# Patient Record
Sex: Female | Born: 1945
Health system: Southern US, Community
[De-identification: ages and names within clinical notes are randomized; demographics above are authoritative.]

## PROBLEM LIST (undated history)

## (undated) DIAGNOSIS — K5790 Diverticulosis of intestine, part unspecified, without perforation or abscess without bleeding: Secondary | ICD-10-CM

## (undated) DIAGNOSIS — E785 Hyperlipidemia, unspecified: Secondary | ICD-10-CM

## (undated) DIAGNOSIS — I10 Essential (primary) hypertension: Secondary | ICD-10-CM

## (undated) DIAGNOSIS — T7840XA Allergy, unspecified, initial encounter: Secondary | ICD-10-CM

## (undated) DIAGNOSIS — K219 Gastro-esophageal reflux disease without esophagitis: Secondary | ICD-10-CM

## (undated) DIAGNOSIS — H269 Unspecified cataract: Secondary | ICD-10-CM

## (undated) DIAGNOSIS — J45909 Unspecified asthma, uncomplicated: Secondary | ICD-10-CM

## (undated) DIAGNOSIS — M1990 Unspecified osteoarthritis, unspecified site: Secondary | ICD-10-CM

## (undated) DIAGNOSIS — I341 Nonrheumatic mitral (valve) prolapse: Secondary | ICD-10-CM

## (undated) DIAGNOSIS — IMO0002 Reserved for concepts with insufficient information to code with codable children: Secondary | ICD-10-CM

## (undated) DIAGNOSIS — R87619 Unspecified abnormal cytological findings in specimens from cervix uteri: Secondary | ICD-10-CM

## (undated) DIAGNOSIS — C801 Malignant (primary) neoplasm, unspecified: Secondary | ICD-10-CM

## (undated) DIAGNOSIS — M8580 Other specified disorders of bone density and structure, unspecified site: Secondary | ICD-10-CM

## (undated) HISTORY — DX: Diverticulosis of intestine, part unspecified, without perforation or abscess without bleeding: K57.90

## (undated) HISTORY — DX: Nonrheumatic mitral (valve) prolapse: I34.1

## (undated) HISTORY — PX: TRIGGER FINGER RELEASE: SHX641

## (undated) HISTORY — DX: Allergy, unspecified, initial encounter: T78.40XA

## (undated) HISTORY — PX: HAND SURGERY: SHX662

## (undated) HISTORY — DX: Hyperlipidemia, unspecified: E78.5

## (undated) HISTORY — PX: TUBAL LIGATION: SHX77

## (undated) HISTORY — DX: Unspecified cataract: H26.9

## (undated) HISTORY — DX: Unspecified osteoarthritis, unspecified site: M19.90

## (undated) HISTORY — PX: CATARACT EXTRACTION, BILATERAL: SHX1313

## (undated) HISTORY — DX: Other specified disorders of bone density and structure, unspecified site: M85.80

## (undated) HISTORY — DX: Essential (primary) hypertension: I10

## (undated) HISTORY — DX: Unspecified asthma, uncomplicated: J45.909

## (undated) HISTORY — DX: Reserved for concepts with insufficient information to code with codable children: IMO0002

## (undated) HISTORY — DX: Gastro-esophageal reflux disease without esophagitis: K21.9

## (undated) HISTORY — DX: Unspecified abnormal cytological findings in specimens from cervix uteri: R87.619

## (undated) HISTORY — DX: Malignant (primary) neoplasm, unspecified: C80.1

---

## 1996-05-07 HISTORY — PX: COLONOSCOPY W/ POLYPECTOMY: SHX1380

## 1998-03-01 ENCOUNTER — Other Ambulatory Visit: Admission: RE | Admit: 1998-03-01 | Discharge: 1998-03-01 | Payer: Self-pay | Admitting: Gynecology

## 1998-03-28 ENCOUNTER — Other Ambulatory Visit: Admission: RE | Admit: 1998-03-28 | Discharge: 1998-03-28 | Payer: Self-pay | Admitting: Gynecology

## 1999-04-18 ENCOUNTER — Other Ambulatory Visit: Admission: RE | Admit: 1999-04-18 | Discharge: 1999-04-18 | Payer: Self-pay | Admitting: Gynecology

## 1999-08-07 ENCOUNTER — Encounter: Admission: RE | Admit: 1999-08-07 | Discharge: 1999-08-07 | Payer: Self-pay | Admitting: Gynecology

## 1999-08-07 ENCOUNTER — Encounter: Payer: Self-pay | Admitting: Gynecology

## 2000-05-16 ENCOUNTER — Other Ambulatory Visit: Admission: RE | Admit: 2000-05-16 | Discharge: 2000-05-16 | Payer: Self-pay | Admitting: Gynecology

## 2000-08-12 ENCOUNTER — Encounter: Admission: RE | Admit: 2000-08-12 | Discharge: 2000-08-12 | Payer: Self-pay | Admitting: Internal Medicine

## 2000-08-12 ENCOUNTER — Encounter: Payer: Self-pay | Admitting: Internal Medicine

## 2001-06-10 ENCOUNTER — Other Ambulatory Visit: Admission: RE | Admit: 2001-06-10 | Discharge: 2001-06-10 | Payer: Self-pay | Admitting: Gynecology

## 2001-08-14 ENCOUNTER — Encounter: Admission: RE | Admit: 2001-08-14 | Discharge: 2001-08-14 | Payer: Self-pay | Admitting: Internal Medicine

## 2001-08-14 ENCOUNTER — Encounter: Payer: Self-pay | Admitting: Internal Medicine

## 2002-07-14 ENCOUNTER — Other Ambulatory Visit: Admission: RE | Admit: 2002-07-14 | Discharge: 2002-07-14 | Payer: Self-pay | Admitting: Gynecology

## 2002-08-20 ENCOUNTER — Encounter: Payer: Self-pay | Admitting: Internal Medicine

## 2002-08-20 ENCOUNTER — Encounter: Admission: RE | Admit: 2002-08-20 | Discharge: 2002-08-20 | Payer: Self-pay | Admitting: Internal Medicine

## 2003-07-29 ENCOUNTER — Other Ambulatory Visit: Admission: RE | Admit: 2003-07-29 | Discharge: 2003-07-29 | Payer: Self-pay | Admitting: Gynecology

## 2003-08-27 ENCOUNTER — Encounter: Admission: RE | Admit: 2003-08-27 | Discharge: 2003-08-27 | Payer: Self-pay | Admitting: Gynecology

## 2004-04-20 ENCOUNTER — Ambulatory Visit: Payer: Self-pay | Admitting: Oncology

## 2004-06-15 ENCOUNTER — Ambulatory Visit: Payer: Self-pay | Admitting: Internal Medicine

## 2004-08-03 ENCOUNTER — Other Ambulatory Visit: Admission: RE | Admit: 2004-08-03 | Discharge: 2004-08-03 | Payer: Self-pay | Admitting: Gynecology

## 2004-09-07 ENCOUNTER — Encounter: Admission: RE | Admit: 2004-09-07 | Discharge: 2004-09-07 | Payer: Self-pay | Admitting: Gynecology

## 2005-01-17 ENCOUNTER — Ambulatory Visit: Payer: Self-pay | Admitting: Internal Medicine

## 2005-08-07 ENCOUNTER — Ambulatory Visit (HOSPITAL_BASED_OUTPATIENT_CLINIC_OR_DEPARTMENT_OTHER): Admission: RE | Admit: 2005-08-07 | Discharge: 2005-08-07 | Payer: Self-pay | Admitting: Orthopedic Surgery

## 2005-08-31 ENCOUNTER — Ambulatory Visit: Payer: Self-pay | Admitting: Internal Medicine

## 2005-09-13 ENCOUNTER — Encounter: Admission: RE | Admit: 2005-09-13 | Discharge: 2005-09-13 | Payer: Self-pay | Admitting: Gynecology

## 2005-09-14 ENCOUNTER — Ambulatory Visit: Payer: Self-pay | Admitting: Internal Medicine

## 2005-09-17 ENCOUNTER — Encounter: Admission: RE | Admit: 2005-09-17 | Discharge: 2005-09-17 | Payer: Self-pay | Admitting: Gynecology

## 2006-01-11 ENCOUNTER — Ambulatory Visit: Payer: Self-pay | Admitting: Internal Medicine

## 2006-03-05 ENCOUNTER — Observation Stay (HOSPITAL_COMMUNITY): Admission: EM | Admit: 2006-03-05 | Discharge: 2006-03-06 | Payer: Self-pay | Admitting: Emergency Medicine

## 2006-03-05 ENCOUNTER — Ambulatory Visit: Payer: Self-pay | Admitting: Internal Medicine

## 2006-03-05 HISTORY — PX: CARDIOVASCULAR STRESS TEST: SHX262

## 2006-03-06 ENCOUNTER — Ambulatory Visit: Payer: Self-pay

## 2006-03-08 ENCOUNTER — Ambulatory Visit: Payer: Self-pay | Admitting: Internal Medicine

## 2006-03-14 ENCOUNTER — Ambulatory Visit: Payer: Self-pay | Admitting: Internal Medicine

## 2006-05-16 ENCOUNTER — Ambulatory Visit: Payer: Self-pay | Admitting: Internal Medicine

## 2006-06-20 ENCOUNTER — Ambulatory Visit: Payer: Self-pay | Admitting: Internal Medicine

## 2006-06-21 ENCOUNTER — Emergency Department (HOSPITAL_COMMUNITY): Admission: EM | Admit: 2006-06-21 | Discharge: 2006-06-21 | Payer: Self-pay | Admitting: Emergency Medicine

## 2006-07-08 ENCOUNTER — Ambulatory Visit: Payer: Self-pay | Admitting: Internal Medicine

## 2006-10-17 ENCOUNTER — Ambulatory Visit: Payer: Self-pay | Admitting: Internal Medicine

## 2006-10-17 DIAGNOSIS — Z860100 Personal history of colon polyps, unspecified: Secondary | ICD-10-CM | POA: Insufficient documentation

## 2006-11-21 ENCOUNTER — Encounter: Admission: RE | Admit: 2006-11-21 | Discharge: 2006-11-21 | Payer: Self-pay | Admitting: Gynecology

## 2006-12-03 ENCOUNTER — Ambulatory Visit: Payer: Self-pay | Admitting: Internal Medicine

## 2006-12-03 LAB — CONVERTED CEMR LAB
ALT: 16 units/L (ref 0–40)
Cholesterol: 235 mg/dL (ref 0–200)
Creatinine, Ser: 0.7 mg/dL (ref 0.4–1.2)
Potassium: 3.4 meq/L — ABNORMAL LOW (ref 3.5–5.1)
VLDL: 40 mg/dL (ref 0–40)

## 2006-12-06 ENCOUNTER — Telehealth: Payer: Self-pay | Admitting: Internal Medicine

## 2006-12-13 ENCOUNTER — Ambulatory Visit: Payer: Self-pay | Admitting: Internal Medicine

## 2006-12-13 DIAGNOSIS — T887XXA Unspecified adverse effect of drug or medicament, initial encounter: Secondary | ICD-10-CM | POA: Insufficient documentation

## 2006-12-16 ENCOUNTER — Telehealth (INDEPENDENT_AMBULATORY_CARE_PROVIDER_SITE_OTHER): Payer: Self-pay | Admitting: *Deleted

## 2006-12-17 ENCOUNTER — Ambulatory Visit: Payer: Self-pay | Admitting: Internal Medicine

## 2006-12-18 ENCOUNTER — Ambulatory Visit: Payer: Self-pay | Admitting: Internal Medicine

## 2006-12-19 ENCOUNTER — Encounter (INDEPENDENT_AMBULATORY_CARE_PROVIDER_SITE_OTHER): Payer: Self-pay | Admitting: *Deleted

## 2007-03-13 ENCOUNTER — Ambulatory Visit: Payer: Self-pay | Admitting: Internal Medicine

## 2007-03-13 LAB — CONVERTED CEMR LAB
Direct LDL: 108.8 mg/dL
Triglycerides: 227 mg/dL (ref 0–149)
VLDL: 45 mg/dL — ABNORMAL HIGH (ref 0–40)

## 2007-03-20 ENCOUNTER — Ambulatory Visit: Payer: Self-pay | Admitting: Internal Medicine

## 2007-03-20 DIAGNOSIS — I10 Essential (primary) hypertension: Secondary | ICD-10-CM

## 2007-03-20 DIAGNOSIS — E782 Mixed hyperlipidemia: Secondary | ICD-10-CM

## 2007-03-20 DIAGNOSIS — R609 Edema, unspecified: Secondary | ICD-10-CM | POA: Insufficient documentation

## 2007-03-20 LAB — CONVERTED CEMR LAB: LDL Goal: 130 mg/dL

## 2007-05-08 HISTORY — PX: CARPAL TUNNEL RELEASE: SHX101

## 2007-05-16 ENCOUNTER — Encounter: Payer: Self-pay | Admitting: Internal Medicine

## 2007-05-19 ENCOUNTER — Encounter (INDEPENDENT_AMBULATORY_CARE_PROVIDER_SITE_OTHER): Payer: Self-pay | Admitting: *Deleted

## 2007-05-19 ENCOUNTER — Ambulatory Visit: Payer: Self-pay | Admitting: Internal Medicine

## 2007-05-29 ENCOUNTER — Ambulatory Visit (HOSPITAL_BASED_OUTPATIENT_CLINIC_OR_DEPARTMENT_OTHER): Admission: RE | Admit: 2007-05-29 | Discharge: 2007-05-29 | Payer: Self-pay | Admitting: Orthopedic Surgery

## 2007-07-18 ENCOUNTER — Ambulatory Visit: Payer: Self-pay | Admitting: Internal Medicine

## 2007-07-18 LAB — CONVERTED CEMR LAB
Cholesterol: 206 mg/dL (ref 0–200)
Direct LDL: 125.9 mg/dL
HDL: 42.1 mg/dL (ref >39.0)
Hgb A1c MFr Bld: 5.6 % (ref 4.6–6.0)
Total CHOL/HDL Ratio: 4.9
Triglycerides: 177 mg/dL — ABNORMAL HIGH (ref 0–149)
VLDL: 35 mg/dL (ref 0–40)

## 2007-07-25 ENCOUNTER — Ambulatory Visit: Payer: Self-pay | Admitting: Internal Medicine

## 2007-10-24 ENCOUNTER — Telehealth (INDEPENDENT_AMBULATORY_CARE_PROVIDER_SITE_OTHER): Payer: Self-pay | Admitting: *Deleted

## 2007-10-24 ENCOUNTER — Ambulatory Visit: Payer: Self-pay | Admitting: Internal Medicine

## 2007-10-24 LAB — CONVERTED CEMR LAB
ALT: 14 units/L (ref 0–35)
Albumin: 3.7 g/dL (ref 3.5–5.2)
Alkaline Phosphatase: 44 units/L (ref 39–117)
BUN: 17 mg/dL (ref 6–23)
Bilirubin, Direct: 0.1 mg/dL (ref 0.0–0.3)
Cholesterol: 165 mg/dL (ref 0–200)
Total Bilirubin: 0.8 mg/dL (ref 0.3–1.2)
Total CHOL/HDL Ratio: 3.8

## 2007-10-31 ENCOUNTER — Ambulatory Visit: Payer: Self-pay | Admitting: Internal Medicine

## 2007-10-31 DIAGNOSIS — M8580 Other specified disorders of bone density and structure, unspecified site: Secondary | ICD-10-CM

## 2007-10-31 LAB — CONVERTED CEMR LAB: HDL goal, serum: 50 mg/dL

## 2007-11-06 ENCOUNTER — Encounter: Payer: Self-pay | Admitting: Internal Medicine

## 2007-11-06 ENCOUNTER — Ambulatory Visit: Payer: Self-pay | Admitting: Internal Medicine

## 2007-12-05 ENCOUNTER — Telehealth (INDEPENDENT_AMBULATORY_CARE_PROVIDER_SITE_OTHER): Payer: Self-pay | Admitting: *Deleted

## 2007-12-11 ENCOUNTER — Encounter: Admission: RE | Admit: 2007-12-11 | Discharge: 2007-12-11 | Payer: Self-pay | Admitting: Gynecology

## 2007-12-31 ENCOUNTER — Ambulatory Visit: Payer: Self-pay | Admitting: Internal Medicine

## 2007-12-31 DIAGNOSIS — K219 Gastro-esophageal reflux disease without esophagitis: Secondary | ICD-10-CM

## 2007-12-31 DIAGNOSIS — K5730 Diverticulosis of large intestine without perforation or abscess without bleeding: Secondary | ICD-10-CM | POA: Insufficient documentation

## 2007-12-31 LAB — CONVERTED CEMR LAB
Ketones, urine, test strip: NEGATIVE
Urobilinogen, UA: 0.2

## 2008-01-01 ENCOUNTER — Telehealth: Payer: Self-pay | Admitting: Internal Medicine

## 2008-01-01 LAB — CONVERTED CEMR LAB
ALT: 14 units/L (ref 0–35)
Alkaline Phosphatase: 49 units/L (ref 39–117)
Amylase: 50 units/L (ref 27–131)
Basophils Absolute: 0 10*3/uL (ref 0.0–0.1)
Eosinophils Absolute: 0.1 10*3/uL (ref 0.0–0.7)
Eosinophils Relative: 1.5 % (ref 0.0–5.0)
Hemoglobin: 13.7 g/dL (ref 12.0–15.0)
Lymphocytes Relative: 27.6 % (ref 12.0–46.0)
MCHC: 34.5 g/dL (ref 30.0–36.0)
Neutro Abs: 4.3 10*3/uL (ref 1.4–7.7)
Total Protein: 7.5 g/dL (ref 6.0–8.3)
WBC: 6.7 10*3/uL (ref 4.5–10.5)

## 2008-01-05 ENCOUNTER — Encounter (INDEPENDENT_AMBULATORY_CARE_PROVIDER_SITE_OTHER): Payer: Self-pay | Admitting: *Deleted

## 2008-02-06 ENCOUNTER — Telehealth (INDEPENDENT_AMBULATORY_CARE_PROVIDER_SITE_OTHER): Payer: Self-pay | Admitting: *Deleted

## 2008-02-07 ENCOUNTER — Ambulatory Visit: Payer: Self-pay | Admitting: Family Medicine

## 2008-02-07 LAB — CONVERTED CEMR LAB
Blood in Urine, dipstick: NEGATIVE
Glucose, Urine, Semiquant: NEGATIVE
Nitrite: NEGATIVE
Protein, U semiquant: NEGATIVE
Specific Gravity, Urine: 1.01
Urobilinogen, UA: 0.2
WBC Urine, dipstick: NEGATIVE

## 2008-05-06 ENCOUNTER — Ambulatory Visit: Payer: Self-pay | Admitting: Internal Medicine

## 2008-05-06 LAB — CONVERTED CEMR LAB
ALT: 17 units/L (ref 0–35)
AST: 23 units/L (ref 0–37)
Alkaline Phosphatase: 53 units/L (ref 39–117)
BUN: 15 mg/dL (ref 6–23)
Creatinine, Ser: 0.6 mg/dL (ref 0.4–1.2)
Potassium: 3.9 meq/L (ref 3.5–5.1)
Total Bilirubin: 1.1 mg/dL (ref 0.3–1.2)
Total Protein: 7.1 g/dL (ref 6.0–8.3)
Vit D, 1,25-Dihydroxy: 40 (ref 30–89)

## 2008-05-10 ENCOUNTER — Encounter (INDEPENDENT_AMBULATORY_CARE_PROVIDER_SITE_OTHER): Payer: Self-pay | Admitting: *Deleted

## 2008-05-13 ENCOUNTER — Ambulatory Visit: Payer: Self-pay | Admitting: Internal Medicine

## 2008-06-14 ENCOUNTER — Ambulatory Visit: Payer: Self-pay | Admitting: Internal Medicine

## 2008-06-18 ENCOUNTER — Ambulatory Visit: Payer: Self-pay | Admitting: Internal Medicine

## 2008-06-18 LAB — CONVERTED CEMR LAB
Blood in Urine, dipstick: NEGATIVE
Ketones, urine, test strip: NEGATIVE
Nitrite: NEGATIVE
Protein, U semiquant: NEGATIVE
Specific Gravity, Urine: 1.015

## 2008-06-22 ENCOUNTER — Encounter (INDEPENDENT_AMBULATORY_CARE_PROVIDER_SITE_OTHER): Payer: Self-pay | Admitting: *Deleted

## 2008-06-22 ENCOUNTER — Telehealth: Payer: Self-pay | Admitting: Internal Medicine

## 2008-06-22 LAB — CONVERTED CEMR LAB
ALT: 20 units/L (ref 0–35)
BUN: 12 mg/dL (ref 6–23)
Basophils Absolute: 0.1 10*3/uL (ref 0.0–0.1)
Basophils Relative: 0.9 % (ref 0.0–3.0)
CO2: 30 meq/L (ref 19–32)
Eosinophils Absolute: 0.1 10*3/uL (ref 0.0–0.7)
Glucose, Bld: 100 mg/dL — ABNORMAL HIGH (ref 70–99)
Neutro Abs: 4.1 10*3/uL (ref 1.4–7.7)
Neutrophils Relative %: 61.4 % (ref 43.0–77.0)
Platelets: 222 10*3/uL (ref 150–400)
Potassium: 4.7 meq/L (ref 3.5–5.1)
RDW: 11.5 % (ref 11.5–14.6)
Sodium: 138 meq/L (ref 135–145)
Total Bilirubin: 0.8 mg/dL (ref 0.3–1.2)
Total Protein: 7.5 g/dL (ref 6.0–8.3)

## 2008-11-11 ENCOUNTER — Ambulatory Visit: Payer: Self-pay | Admitting: Internal Medicine

## 2008-11-11 LAB — CONVERTED CEMR LAB
Bilirubin Urine: NEGATIVE
Blood in Urine, dipstick: NEGATIVE
Protein, U semiquant: NEGATIVE
WBC Urine, dipstick: NEGATIVE
pH: 7

## 2008-11-21 LAB — CONVERTED CEMR LAB
ALT: 22 units/L (ref 0–35)
AST: 24 units/L (ref 0–37)
Alkaline Phosphatase: 50 units/L (ref 39–117)
Bilirubin, Direct: 0.1 mg/dL (ref 0.0–0.3)
Cholesterol: 166 mg/dL (ref 0–200)
Total Bilirubin: 1.2 mg/dL (ref 0.3–1.2)
Total CHOL/HDL Ratio: 4
Triglycerides: 162 mg/dL — ABNORMAL HIGH (ref 0.0–149.0)

## 2008-11-23 ENCOUNTER — Encounter (INDEPENDENT_AMBULATORY_CARE_PROVIDER_SITE_OTHER): Payer: Self-pay | Admitting: *Deleted

## 2008-12-17 ENCOUNTER — Encounter: Admission: RE | Admit: 2008-12-17 | Discharge: 2008-12-17 | Payer: Self-pay | Admitting: Gynecology

## 2009-06-09 ENCOUNTER — Ambulatory Visit: Payer: Self-pay | Admitting: Internal Medicine

## 2009-06-09 DIAGNOSIS — M2550 Pain in unspecified joint: Secondary | ICD-10-CM | POA: Insufficient documentation

## 2009-08-02 ENCOUNTER — Ambulatory Visit: Payer: Self-pay | Admitting: Internal Medicine

## 2009-08-02 DIAGNOSIS — R109 Unspecified abdominal pain: Secondary | ICD-10-CM | POA: Insufficient documentation

## 2009-08-02 LAB — CONVERTED CEMR LAB
Bilirubin Urine: NEGATIVE
Nitrite: NEGATIVE
pH: 6.5

## 2009-08-23 ENCOUNTER — Ambulatory Visit: Payer: Self-pay | Admitting: Internal Medicine

## 2009-08-23 DIAGNOSIS — R351 Nocturia: Secondary | ICD-10-CM

## 2009-08-23 DIAGNOSIS — R109 Unspecified abdominal pain: Secondary | ICD-10-CM | POA: Insufficient documentation

## 2009-09-15 ENCOUNTER — Ambulatory Visit: Payer: Self-pay | Admitting: Internal Medicine

## 2009-09-15 LAB — CONVERTED CEMR LAB
OCCULT 1: NEGATIVE
OCCULT 2: NEGATIVE

## 2009-09-16 ENCOUNTER — Encounter (INDEPENDENT_AMBULATORY_CARE_PROVIDER_SITE_OTHER): Payer: Self-pay | Admitting: *Deleted

## 2009-12-19 ENCOUNTER — Encounter (INDEPENDENT_AMBULATORY_CARE_PROVIDER_SITE_OTHER): Payer: Self-pay | Admitting: *Deleted

## 2010-01-20 ENCOUNTER — Encounter: Admission: RE | Admit: 2010-01-20 | Discharge: 2010-01-20 | Payer: Self-pay | Admitting: Gynecology

## 2010-01-26 ENCOUNTER — Encounter: Admission: RE | Admit: 2010-01-26 | Discharge: 2010-01-26 | Payer: Self-pay | Admitting: Gynecology

## 2010-02-20 ENCOUNTER — Ambulatory Visit: Payer: Self-pay | Admitting: Internal Medicine

## 2010-02-20 DIAGNOSIS — J029 Acute pharyngitis, unspecified: Secondary | ICD-10-CM

## 2010-03-07 ENCOUNTER — Ambulatory Visit: Payer: Self-pay | Admitting: Internal Medicine

## 2010-03-07 DIAGNOSIS — H6980 Other specified disorders of Eustachian tube, unspecified ear: Secondary | ICD-10-CM

## 2010-05-28 ENCOUNTER — Encounter: Payer: Self-pay | Admitting: Gynecology

## 2010-06-04 LAB — CONVERTED CEMR LAB
BUN: 15 mg/dL (ref 6–23)
Basophils Absolute: 0 10*3/uL (ref 0.0–0.1)
CO2: 33 meq/L — ABNORMAL HIGH (ref 19–32)
Calcium: 9.4 mg/dL (ref 8.4–10.5)
Creatinine, Ser: 0.7 mg/dL (ref 0.4–1.2)
Eosinophils Absolute: 0.1 10*3/uL (ref 0.0–0.7)
Eosinophils Relative: 2.8 % (ref 0.0–5.0)
GFR calc non Af Amer: 89.48 mL/min (ref >60)
HDL: 57.8 mg/dL (ref >39.00)
Lymphs Abs: 1.9 10*3/uL (ref 0.7–4.0)
MCHC: 32.9 g/dL (ref 30.0–36.0)
MCV: 98.9 fL (ref 78.0–100.0)
Monocytes Relative: 7.4 % (ref 3.0–12.0)
Platelets: 160 10*3/uL (ref 150.0–400.0)
Potassium: 4.1 meq/L (ref 3.5–5.1)
RBC: 4.07 M/uL (ref 3.87–5.11)
RDW: 11.8 % (ref 11.5–14.6)
Total CHOL/HDL Ratio: 3
Triglycerides: 94 mg/dL (ref 0.0–149.0)
VLDL: 18.8 mg/dL (ref 0.0–40.0)
Vit D, 25-Hydroxy: 44 ng/mL (ref 30–89)
WBC: 5.1 10*3/uL (ref 4.5–10.5)

## 2010-06-08 NOTE — Letter (Signed)
Summary: Colonoscopy Letter  [facility]  [address] [place], [place] [address]   Phone: [phone]  Fax: [phone]      December 19, 2009 MRN: [id number]   Kim Fuller [address] [place], [place]  [address]   Dear [name],   According to your medical record, it is time for you to schedule a Colonoscopy. The American Cancer Society recommends this procedure as a method to detect early colon cancer. Patients with a family history of colon cancer, or a personal history of colon polyps or inflammatory bowel disease are at increased risk.  This letter has beeen generated based on the recommendations made at the time of your procedure. If you feel that in your particular situation this may no longer apply, please contact our office.  Please call our office at [phone] to schedule this appointment or to update your records at your earliest convenience.  Thank you for cooperating with [place] to provide you with the very best care possible.   Sincerely,   Iva Boop, M.D.  [facility] [phone]

## 2010-06-08 NOTE — Assessment & Plan Note (Signed)
Summary: earache,sore throat/cbs   Vital Signs:  Patient profile:   65 year old female Height:      62 inches Weight:      199.4 pounds BMI:     36.60 Temp:     98.6 degrees F oral Pulse rate:   72 / minute Resp:     16 per minute BP sitting:   128 / 80  (left arm) Cuff size:   large  Vitals Entered By: Shonna Chock CMA (March 07, 2010 10:39 AM) CC: Left earache and sore throat (completed ABX Thursday), URI symptoms   CC:  Left earache and sore throat (completed ABX Thursday) and URI symptoms.  History of Present Illness: URI Symptoms      This is a 65 year old woman who presents with URI symptoms since 10/27 as pressure L ear w/o discharge .  The patient reports sore throat, productive cough with scant yellow, and earache, but denies nasal congestion and purulent nasal discharge.  The patient denies fever, dyspnea, and wheezing.  The patient denies headache.  The patient denies the following risk factors for Strep sinusitis: unilateral facial pain, tooth pain, and tender adenopathy.  Rx: S/P Amox, Magic mouthwash, Rx cogh syrup  Current Medications (verified): 1)  Baby Aspirin 81 Mg  Chew (Aspirin) .Marland Kitchen.. 1 By Mouth Once Daily 2)  Bisoprolol-Hydrochlorothiazide 5-6.25 Mg  Tabs (Bisoprolol-Hydrochlorothiazide) .... Once Daily 3)  Lotensin 40 Mg Tabs (Benazepril Hcl) .... 1/2-1 By Mouth To Maintain Bp Average < 130/85 4)  Multivitamin 5)  Vit E 400 Iu Qd 6)  Caltrate Bid 7)  Pravastatin 40 Mg Tabs (Pravastatin Sodium) .Marland Kitchen.. 1 By Mouth Once Daily 8)  Vitamin D 1000 Unit Tabs (Cholecalciferol) .Marland Kitchen.. 1 By Mouth Once Daily 9)  Fish Oil 1000 Mg Caps (Omega-3 Fatty Acids) .... Take One Tablet Two Times A Day 10)  Hydromet 5-1.5 Mg/50ml Syrp (Hydrocodone-Homatropine) .Marland Kitchen.. 1 Tsp Every 6 Hrs As Needed Cough  Allergies: 1)  ! Sulfa 2)  ! Ultram  Physical Exam  General:  in no acute distress; alert,appropriate and cooperative throughout examination Ears:  External ear exam shows no  significant lesions or deformities.  Otoscopic examination reveals clear canals, tympanic membranes are intact bilaterally without bulging, retraction, inflammation or discharge. Hearing is grossly normal bilaterally. L TM : ? serous otitis changes, R TM: dull Nose:  External nasal examination shows no deformity or inflammation. Nasal mucosa are pink and moist without lesions or exudates. Mouth:  Oral mucosa and oropharynx without lesions or exudates.   Minimal R pharyngeal erythema.   Lungs:  Normal respiratory effort, chest expands symmetrically. Lungs are clear to auscultation, no crackles or wheezes. Cervical Nodes:  No lymphadenopathy noted Axillary Nodes:  No palpable lymphadenopathy   Impression & Recommendations:  Problem # 1:  EUSTACHIAN TUBE DYSFUNCTION, LEFT (ICD-381.81)  Problem # 2:  SORE THROAT (ICD-462)  The following medications were removed from the medication list:    Amoxicillin 500 Mg Caps (Amoxicillin) .Marland Kitchen... 1 three times a day Her updated medication list for this problem includes:    Baby Aspirin 81 Mg Chew (Aspirin) .Marland Kitchen... 1 by mouth once daily    Azithromycin 250 Mg Tabs (Azithromycin) .Marland Kitchen... As per pack  Problem # 3:  BRONCHITIS-ACUTE (ICD-466.0)  The following medications were removed from the medication list:    Amoxicillin 500 Mg Caps (Amoxicillin) .Marland Kitchen... 1 three times a day Her updated medication list for this problem includes:    Hydromet 5-1.5 Mg/20ml Syrp (Hydrocodone-homatropine) .Marland [facility]  1 tsp every 6 hrs as needed cough    Azithromycin 250 Mg Tabs (Azithromycin) .Marland Kitchen... As per pack  Complete Medication List: 1)  Baby Aspirin 81 Mg Chew (Aspirin) .Marland Kitchen.. 1 by mouth once daily 2)  Bisoprolol-hydrochlorothiazide 5-6.25 Mg Tabs (Bisoprolol-hydrochlorothiazide) .... Once daily 3)  Lotensin 40 Mg Tabs (Benazepril hcl) .... 1/2-1 by mouth to maintain bp average < 130/85 4)  Multivitamin  5)  Vit E 400 Iu Qd  6)  Caltrate Bid  7)  Pravastatin 40 Mg Tabs (pravastatin  Sodium)  .Marland Kitchen.. 1 by mouth once daily 8)  Vitamin D 1000 Unit Tabs (Cholecalciferol) .Marland Kitchen.. 1 by mouth once daily 9)  Fish Oil 1000 Mg Caps (Omega-3 fatty acids) .... Take one tablet two times a day 10)  Hydromet 5-1.5 Mg/37ml Syrp (Hydrocodone-homatropine) .Marland Kitchen.. 1 tsp every 6 hrs as needed cough 11)  Azithromycin 250 Mg Tabs (Azithromycin) .... As per pack 12)  Nasonex 50 Mcg/act Susp (Mometasone furoate) .Marland Kitchen.. 1 spray two times a day as needed  Patient Instructions: 1)  Chew sugarless gum & perform Valsalva maneuver > 4X/ days. 2)  Drink as much NON dairy fluid as you can tolerate for the next few days. Prescriptions: NASONEX 50 MCG/ACT SUSP (MOMETASONE FUROATE) 1 spray two times a day as needed  #1 x 5   Entered and Authorized by:   Marga Melnick MD   Signed by:   Marga Melnick MD on 03/07/2010   Method used:   Samples Given   RxID:   [id number] AZITHROMYCIN 250 MG TABS (AZITHROMYCIN) as per pack  #1 x 0   Entered and Authorized by:   Marga Melnick MD   Signed by:   Marga Melnick MD on 03/07/2010   Method used:   Faxed to ...       [facility] (retail)       [address]       [place], [place]  [address]       Ph: [phone] or [phone]       Fax: [phone]   RxID:   [id number]    Orders Added: 1)  Est. Patient Level III [956[address]]

## 2010-06-08 NOTE — Assessment & Plan Note (Signed)
Summary: sore throat//low grade fever//lch   Vital Signs:  Patient profile:   65 year old female Weight:      198.2 pounds BMI:     36.98 Temp:     98.3 degrees F oral Pulse rate:   80 / minute Resp:     17 per minute BP sitting:   116 / 70  (left arm) Cuff size:   large  Vitals Entered By: Shonna Chock CMA (February 20, 2010 11:29 AM) CC: Sore throat, fever at night, and productive cough since last Wed, URI symptoms   CC:  Sore throat, fever at night, and productive cough since last Wed, and URI symptoms.  History of Present Illness: RTI Symptoms      This is a 65 year old woman who presents with RTI  symptoms since 10/12 with productive cough.  The patient reports nasal congestion, sore throat, productive cough with yellow-green sputum, and L  earache, but denies purulent nasal discharge.  Associated symptoms include low-grade fever (<100.5 degrees).  The patient denies dyspnea and wheezing.  The patient also reports frontal  headache & bilateral facial pain and tender adenopathy.  The patient denies the following risk factors for Strep sinusitis: tooth pain.  Rx: [place] [employer] [place], Chloraseptic spray, Mucinex, Cold Ease   Current Medications (verified): 1)  Baby Aspirin 81 Mg  Chew (Aspirin) .Marland Kitchen.. 1 By Mouth Once Daily 2)  Bisoprolol-Hydrochlorothiazide 5-6.25 Mg  Tabs (Bisoprolol-Hydrochlorothiazide) .... Once Daily 3)  Lotensin 40 Mg Tabs (Benazepril Hcl) .... 1/2-1 By Mouth To Maintain Bp Average < 130/85 4)  Multivitamin 5)  Vit E 400 Iu Qd 6)  Caltrate Bid 7)  Pravastatin 40 Mg Tabs (Pravastatin Sodium) .Marland Kitchen.. 1 By Mouth Once Daily 8)  Vitamin D 1000 Unit Tabs (Cholecalciferol) .Marland Kitchen.. 1 By Mouth Once Daily 9)  Fish Oil 1000 Mg Caps (Omega-3 Fatty Acids) .... Take One Tablet Two Times A Day  Allergies: 1)  ! Sulfa 2)  ! Ultram  Physical Exam  General:  well-nourished,in no acute distress; alert,appropriate and cooperative throughout examination Ears:  R ear normal and L TM  erythema.   Nose:  External nasal examination shows no deformity or inflammation. Nasal mucosa are pink and moist without lesions or exudates. Mouth:  Oral mucosa and oropharynx without lesions or exudates.  Teeth in good repair. very hoarse Lungs:  Normal respiratory effort, chest expands symmetrically. Lungs are clear to auscultation, no crackles or wheezes. Heart:  Normal rate and regular rhythm. S1 and S2 normal without gallop, murmur, click, rub or other extra sounds. Cervical Nodes:  No lymphadenopathy noted Axillary Nodes:  No palpable lymphadenopathy   Impression & Recommendations:  Problem # 1:  BRONCHITIS-ACUTE (ICD-466.0)  Her updated medication list for this problem includes:    Amoxicillin 500 Mg Caps (Amoxicillin) .Marland Kitchen... 1 three times a day    Hydromet 5-1.5 Mg/90ml Syrp (Hydrocodone-homatropine) .Marland Kitchen... 1 tsp every 6 hrs as needed cough  Problem # 2:  URI (ICD-465.9)  Her updated medication list for this problem includes:    Baby Aspirin 81 Mg Chew (Aspirin) .Marland Kitchen... 1 by mouth once daily    Hydromet 5-1.5 Mg/35ml Syrp (Hydrocodone-homatropine) .Marland Kitchen... 1 tsp every 6 hrs as needed cough  Complete Medication List: 1)  Baby Aspirin 81 Mg Chew (Aspirin) .Marland Kitchen.. 1 by mouth once daily 2)  Bisoprolol-hydrochlorothiazide 5-6.25 Mg Tabs (Bisoprolol-hydrochlorothiazide) .... Once daily 3)  Lotensin 40 Mg Tabs (Benazepril hcl) .... 1/2-1 by mouth to maintain bp average < 130/85 4)  Multivitamin  5)  Vit E 400 Iu Qd  6)  Caltrate Bid  7)  Pravastatin 40 Mg Tabs (pravastatin Sodium)  .Marland Kitchen.. 1 by mouth once daily 8)  Vitamin D 1000 Unit Tabs (Cholecalciferol) .Marland Kitchen.. 1 by mouth once daily 9)  Fish Oil 1000 Mg Caps (Omega-3 fatty acids) .... Take one tablet two times a day 10)  Amoxicillin 500 Mg Caps (Amoxicillin) .Marland Kitchen.. 1 three times a day 11)  Hydromet 5-1.5 Mg/29ml Syrp (Hydrocodone-homatropine) .Marland Kitchen.. 1 tsp every 6 hrs as needed cough  Other Orders: Rapid Strep (16[address])  Patient Instructions: 1)   Neti pot once daily until sinuses are clear. 2)  Drink as much  NON dairy fluid as you can tolerate for the next few days. Prescriptions: HYDROMET 5-1.5 MG/5ML SYRP (HYDROCODONE-HOMATROPINE) 1 tsp every 6 hrs as needed cough  #120 cc x 0   Entered and Authorized by:   Marga Melnick MD   Signed by:   Marga Melnick MD on 02/20/2010   Method used:   Print then Give to Patient   RxID:   [id number] AMOXICILLIN 500 MG CAPS (AMOXICILLIN) 1 three times a day  #30 x 0   Entered and Authorized by:   Marga Melnick MD   Signed by:   Marga Melnick MD on 02/20/2010   Method used:   Print then Give to Patient   RxID:   [id number]    Orders Added: 1)  Rapid Strep [29528] 2)  Est. Patient Level III [41324]

## 2010-06-08 NOTE — Assessment & Plan Note (Signed)
Summary: cpx/ns/kdc   Vital Signs:  Patient profile:   65 year old female Height:      61.50 inches Weight:      189 pounds BMI:     35.26 Pulse rate:   74 / minute Resp:     17 per minute BP sitting:   134 / 78  (left arm)  Vitals Entered By: Doristine Devoid (June 09, 2009 2:17 PM) CC: CPX , Hypertension Management, Lipid Management   CC:  CPX , Hypertension Management, and Lipid Management.  History of Present Illness: Kim Fuller is here for med refill ; she is essentially asymptomatic except for pain in dorsum of R foot intermittently  past week +.ZO:XWRU. No PMH of gout. No specific diet; decreased CVE.  Hypertension History:      She complains of peripheral edema, but denies headache, chest pain, palpitations, dyspnea with exertion, orthopnea, PND, visual symptoms, neurologic problems, syncope, and side effects from treatment.  Further comments include: BP @ home 118/70s.        Positive major cardiovascular risk factors include female age 67 years old or older, hyperlipidemia, and hypertension.  Negative major cardiovascular risk factors include no history of diabetes, negative family history for ischemic heart disease, and non-tobacco-user status.        Further assessment for target organ damage reveals no history of ASHD, stroke/TIA, or peripheral vascular disease.    Lipid Management History:      Positive NCEP/ATP III risk factors include female age 88 years old or older and hypertension.  Negative NCEP/ATP III risk factors include non-diabetic, no family history for ischemic heart disease, non-tobacco-user status, no ASHD (atherosclerotic heart disease), no prior stroke/TIA, no peripheral vascular disease, and no history of aortic aneurysm.      Allergies: 1)  ! Sulfa  Past History:  Past Medical History: HYPERLIPIDEMIA (ICD-272.2) REACTIVE AIRWAY DISEASE (ICD-493.90) HYPERTENSION (ICD-401.9) COLONIC POLYPS, HX OF (ICD-V12.72) GERD Diverticulosis,  colon Osteopenia; -1.5 @ Femoral neck 11/2007  Past Surgical History: Tubal ligation G2 P2 Colonoscopy-1998 abnormal PAP, PMH of X1 Colonoscopy:diverticulosis-01/2003 Trigger thumb,CTS-08/08/2005 ER for chest pain 03/05/2006 negative Stress Test hand surgery on the right for carpal tunnel 05/2007  Review of Systems General:  Denies chills, fever, sweats, and weight loss. Eyes:  Denies blurring, double vision, and vision loss-both eyes. ENT:  Denies difficulty swallowing and hoarseness. CV:  Denies leg cramps with exertion. GI:  Complains of gas; denies abdominal pain, bloody stools, dark tarry stools, indigestion, and loss of appetite. GU:  Denies discharge, dysuria, and hematuria. MS:  See HPI; Denies joint redness and joint swelling. Derm:  Denies changes in nail beds, dryness, hair loss, and lesion(s). Neuro:  Denies numbness and tingling. Endo:  Denies cold intolerance, excessive hunger, excessive thirst, excessive urination, and heat intolerance.  Physical Exam  General:  well-nourished,in no acute distress; alert,appropriate and cooperative throughout examination Neck:  No deformities, masses, or tenderness noted. Lungs:  Normal respiratory effort, chest expands symmetrically. Lungs are clear to auscultation, no crackles or wheezes. Heart:  Normal rate and regular rhythm. S1 and S2 normal without gallop, murmur, click, rub. S4 with slurring Abdomen:  Bowel sounds positive,abdomen soft and non-tender without masses, organomegaly or hernias noted. Genitalia:  Dr Greta Doom Msk:  No deformity or scoliosis noted of thoracic or lumbar spine.   Pulses:  R and L carotid,radial,dorsalis pedis and posterior tibial pulses are full and equal bilaterally Extremities:  1+ left pedal edema and 1+ right pedal edema. Varicosities over feet  Neurologic:  alert & oriented X3 and DTRs symmetrical and normal.   Skin:  Intact without suspicious lesions or rashes Cervical Nodes:  No lymphadenopathy  noted Axillary Nodes:  No palpable lymphadenopathy Psych:  memory intact for recent and remote, normally interactive, and good eye contact.     Impression & Recommendations:  Problem # 1:  HYPERTENSION, ESSENTIAL NOS (ICD-401.9)  controlled Her updated medication list for this problem includes:    Bisoprolol-hydrochlorothiazide 5-6.25 Mg Tabs (Bisoprolol-hydrochlorothiazide) ..... Once daily    Lotensin 40 Mg Tabs (Benazepril hcl) .Marland Kitchen... 1/2-1 by mouth to maintain bp average < 130/85  Orders: Venipuncture (04540) TLB-BMP (Basic Metabolic Panel-BMET) (80048-METABOL)  Problem # 2:  GERD (ICD-530.81)  stable Her updated medication list for this problem includes:    Ranitidine Hcl Caps (Ranitidine hcl caps) ..... 150mg  two times a day as needed  Orders: TLB-CBC Platelet - w/Differential (85025-CBCD)  Problem # 3:  OSTEOPENIA (ICD-733.90)  BMD due 12/2009  Orders: Venipuncture (98119) T-Vitamin D (25-Hydroxy) (14782-95621)  Problem # 4:  HYPERLIPIDEMIA (ICD-272.2)  Orders: Venipuncture (30865) TLB-Lipid Panel (80061-LIPID) TLB-Hepatic/Liver Function Pnl (80076-HEPATIC) TLB-TSH (Thyroid Stimulating Hormone) (84443-TSH)  Problem # 5:  COLONIC POLYPS, HX OF (ICD-V12.72) She is deferring colonoscopy until Medicare  Problem # 6:  ARTHRALGIA (ICD-719.40)  Orders: Venipuncture (78469) TLB-Uric Acid, Blood (84550-URIC)  Problem # 7:  EDEMA- LOCALIZED (ICD-782.3)  Her updated medication list for this problem includes:    Bisoprolol-hydrochlorothiazide 5-6.25 Mg Tabs (Bisoprolol-hydrochlorothiazide) ..... Once daily  Orders: Venipuncture (62952) TLB-BMP (Basic Metabolic Panel-BMET) (80048-METABOL)  Complete Medication List: 1)  Baby Aspirin 81 Mg Chew (Aspirin) .Marland Kitchen.. 1 by mouth once daily 2)  Bisoprolol-hydrochlorothiazide 5-6.25 Mg Tabs (Bisoprolol-hydrochlorothiazide) .... Once daily 3)  Ranitidine Hcl Caps (Ranitidine hcl caps) .... 150mg  two times a day as  needed 4)  Lotensin 40 Mg Tabs (Benazepril hcl) .... 1/2-1 by mouth to maintain bp average < 130/85 5)  Multivitamin  6)  Vit E 400 Iu Qd  7)  Vit B Complex  8)  Caltrate Bid  9)  Pravastatin 40 Mg Tabs (pravastatin Sodium)  .Marland Kitchen.. 1 by mouth once daily 10)  Vitamin D 1000 Unit Tabs (Cholecalciferol) .Marland Kitchen.. 1 by mouth once daily 11)  Fish Oil 1000 Mg Caps (Omega-3 fatty acids) .... Take one tablet two times a day  Other Orders: TD Toxoids IM 7 YR + (84132) Admin 1st Vaccine (44010) EKG w/ Interpretation (93000)  Hypertension Assessment/Plan:      The patient's hypertensive risk group is category B: At least one risk factor (excluding diabetes) with no target organ damage.  Her calculated 10 year risk of coronary heart disease is 9 %.  Today's blood pressure is 134/78.    Lipid Assessment/Plan:      Based on NCEP/ATP III, the patient's risk factor category is "2 or more risk factors and a calculated 10 year CAD risk of < 20%".  The patient's lipid goals are as follows: Total cholesterol goal is 200; LDL cholesterol goal is 100; HDL cholesterol goal is 50; Triglyceride goal is 150.  Her LDL cholesterol goal has been met.  Secondary causes for hyperlipidemia have been ruled out.  She has been counseled on adjunctive measures for lowering her cholesterol and has been provided with dietary instructions.    Patient Instructions: 1)  BMD after 12/2009. 2)  Check your Blood Pressure regularly. If it is above: 135/85 ON AVERAGE you should make an appointment. Prescriptions: PRAVASTATIN 40 MG TABS (PRAVASTATIN SODIUM) 1 by mouth once  daily  #90 x 3   Entered and Authorized by:   Marga Melnick MD   Signed by:   Marga Melnick MD on 06/09/2009   Method used:   Faxed to ...       [facility] (retail)       [address]       [place], [place]  [address]       Ph: [phone] or [phone]       Fax: [phone]   RxID:   [id number] LOTENSIN 40 MG TABS  (BENAZEPRIL HCL) 1/2-1 by mouth to maintain BP average < 130/85  #90 x 1   Entered and Authorized by:   Marga Melnick MD   Signed by:   Marga Melnick MD on 06/09/2009   Method used:   Faxed to ...       [facility] (retail)       [address]       [place], [place]  [address]       Ph: [phone] or [phone]       Fax: [phone]   RxID:   [id number] BISOPROLOL-HYDROCHLOROTHIAZIDE 5-6.25 MG  TABS (BISOPROLOL-HYDROCHLOROTHIAZIDE) once daily  #90 x 3   Entered and Authorized by:   Marga Melnick MD   Signed by:   Marga Melnick MD on 06/09/2009   Method used:   Faxed to ...       [facility] (retail)       [address]       [place], [place]  [address]       Ph: [phone] or [phone]       Fax: [phone]   RxID:   [id number]    Immunizations Administered:  Tetanus Vaccine:    Vaccine Type: Td    Site: right deltoid    Mfr: Sanofi Pasteur    Dose: 0.5 ml    Route: IM    Given by: Doristine Devoid    Exp. Date: 07/20/2009    Lot #: [id number]

## 2010-06-08 NOTE — Letter (Signed)
Summary: Results Follow up Letter  [facility]  [address] [place], [place] [address]   Phone: [phone]  Fax: [phone]    09/16/2009 MRN: [id number]  Catherine Daily [address] [place], [place]  [address]  Dear Ms. KASEMAN,  The following are the results of your recent test(s):  Test         Result    Pap Smear:        Normal _____  Not Normal _____ Comments: ______________________________________________________ Cholesterol: LDL(Bad cholesterol):         Your goal is less than:         HDL (Good cholesterol):       Your goal is more than: Comments:  ______________________________________________________ Mammogram:        Normal _____  Not Normal _____ Comments:  ___________________________________________________________________ Hemoccult:        Normal __X___  Not normal _______ Comments:    _____________________________________________________________________ Other Tests:    We routinely do not discuss normal results over the telephone.  If you desire a copy of the results, or you have any questions about this information we can discuss them at your next office visit.   Sincerely,

## 2010-06-08 NOTE — Assessment & Plan Note (Signed)
Summary: gas pains in stomach/kdc   Vital Signs:  Patient profile:   65 year old female Weight:      188.4 pounds Temp:     98.7 degrees F oral Pulse rate:   60 / minute Resp:     15 per minute BP sitting:   130 / 82  (left arm) Cuff size:   large  Vitals Entered By: Shonna Chock (August 23, 2009 10:39 AM) CC: Thursday patient had diarrhea and since then gas pain (better now) , Abdominal pain Comments REVIEWED MED LIST, PATIENT AGREED DOSE AND INSTRUCTION CORRECT    CC:  Thursday patient had diarrhea and since then gas pain (better now)  and Abdominal pain.  History of Present Illness: Onset evening of 08/18/2009 as loose stools which progressed to frank diarrhea which resolved with Immodium AD. On Fri 04/15 gas, bloating & cramps presented. Rx: Tramadol for cramps with benefit.Pepto Bismol did not help.Cold sweat with weakness Sun am 04/17.Major improvemennt 04/17 & 18 with passing gas & belching.Last colonoscopy 2004: Tics. She plans colonscopy in 03/2010 , once on Medicare. Gas upper chest today , better with Gas-Ex & coke.The patient denies nausea, vomiting, constipation, melena, hematochezia, anorexia, and hematemesis.  The location of the pain is right lower quadrant and left lower quadrant.  The pain is described was  constant 04/16.  The patient denies the following symptoms: fever, weight loss, dysuria, chest pain, jaundice, dark urine, and vaginal bleeding.  The pain is worse with food.  The pain is better with rest.    Allergies: 1)  ! Sulfa 2)  ! Ultram  Review of Systems GI:  Denies indigestion. GU:  Complains of urinary frequency; denies discharge and hematuria; Nocturia several X over 04/15- 04/17 period.  Physical Exam  General:  well-nourished,in no acute distress; alert,appropriate and cooperative throughout examination Eyes:  No corneal or conjunctival inflammation noted. Pupils small.No icterus Mouth:  Oral mucosa and oropharynx without lesions or exudates.   Teeth in good repair. No pharyngeal erythema.  Tongue coated Lungs:  Normal respiratory effort, chest expands symmetrically. Lungs are clear to auscultation, no crackles or wheezes. Heart:  regular rhythm, no murmur, no gallop, no rub, no JVD, no HJR, and bradycardia.  S4 with slurring Abdomen:  Bowel sounds positive,abdomen soft and  minimally tender SUPRAPUBIC area  without masses, organomegaly or hernias noted. Skin:  Intact without suspicious lesions or rashes. Slight tenting Cervical Nodes:  No lymphadenopathy noted Axillary Nodes:  No palpable lymphadenopathy Psych:  memory intact for recent and remote, normally interactive, and good eye contact.     Impression & Recommendations:  Problem # 1:  ABDOMINAL PAIN (ICD-789.00)  BLQ; suprapubic tenderness on exam  Orders: TLB-Udip ONLY (81003-UDIP) T-Culture, Urine ([id number])  Problem # 2:  NOCTURIA (UJW-119.14)  & frequency  Orders: TLB-Udip ONLY (81003-UDIP) T-Culture, Urine ([id number])  Problem # 3:  DIVERTICULOSIS, COLON (ICD-562.10) PMH of , clinically no Diverticulitis  Complete Medication List: 1)  Baby Aspirin 81 Mg Chew (Aspirin) .Marland Kitchen.. 1 by mouth once daily 2)  Bisoprolol-hydrochlorothiazide 5-6.25 Mg Tabs (Bisoprolol-hydrochlorothiazide) .... Once daily 3)  Lotensin 40 Mg Tabs (Benazepril hcl) .... 1/2-1 by mouth to maintain bp average < 130/85 4)  Multivitamin  5)  Vit E 400 Iu Qd  6)  Caltrate Bid  7)  Pravastatin 40 Mg Tabs (pravastatin Sodium)  .Marland Kitchen.. 1 by mouth once daily 8)  Vitamin D 1000 Unit Tabs (Cholecalciferol) .Marland Kitchen.. 1 by mouth once daily 9)  Fish Oil 1000 Mg  Caps (Omega-3 fatty acids) .... Take one tablet two times a day 10)  Hyoscyamine Sulfate 0.125 Mg Subl (Hyoscyamine sulfate) .Marland Kitchen.. 1 under tongue every 4-6 hrs as needed  Patient Instructions: 1)  Take Prilosec 30 min pre b'fast. 2)  Avoid foods high in acid (tomatoes, citrus juices, spicy foods). Avoid eating within two hours of lying down or  before exercising. Do not over eat; try smaller more frequent meals. Elevate head of bed twelve inches when sleeping. Please compete stool cards. Prescriptions: HYOSCYAMINE SULFATE 0.125 MG SUBL (HYOSCYAMINE SULFATE) 1 under tongue every 4-6 hrs as needed  #30 x 0   Entered and Authorized by:   Marga Melnick MD   Signed by:   Marga Melnick MD on 08/23/2009   Method used:   Faxed to ...       [facility] (retail)       [address]       [place], [place]  [address]       Ph: [phone] or [phone]       Fax: [phone]   RxID:   [id number]   Appended Document: gas pains in stomach/kdc    Clinical Lists Changes  Orders: Added new Service order of UA Dipstick w/o Micro (manual) (34742) - Signed Observations: Added new observation of PH URINE: 5.0  (08/23/2009 12:26) Added new observation of SPEC GR URIN: 1.020  (08/23/2009 12:26) Added new observation of WBC DIPSTK U: negative  (08/23/2009 12:26) Added new observation of NITRITE URN: negative  (08/23/2009 12:26) Added new observation of UROBILINOGEN: 0.2  (08/23/2009 12:26) Added new observation of PROTEIN, URN: negative  (08/23/2009 12:26) Added new observation of BLOOD UR DIP: negative  (08/23/2009 12:26) Added new observation of KETONES URN: negative  (08/23/2009 12:26) Added new observation of BILIRUBIN UR: negative  (08/23/2009 12:26) Added new observation of GLUCOSE, URN: negative  (08/23/2009 12:26)      Laboratory Results   Urine Tests    Routine Urinalysis   Glucose: negative   (Normal Range: Negative) Bilirubin: negative   (Normal Range: Negative) Ketone: negative   (Normal Range: Negative) Spec. Gravity: 1.020   (Normal Range: 1.003-1.035) Blood: negative   (Normal Range: Negative) pH: 5.0   (Normal Range: 5.0-8.0) Protein: negative   (Normal Range: Negative) Urobilinogen: 0.2   (Normal Range: 0-1) Nitrite: negative   (Normal Range: Negative) Leukocyte  Esterace: negative   (Normal Range: Negative)

## 2010-06-08 NOTE — Assessment & Plan Note (Signed)
Summary: pain in stomach/back/kdc   Vital Signs:  Patient profile:   65 year old female Weight:      190.2 pounds Temp:     98.3 degrees F oral Pulse rate:   60 / minute Resp:     15 per minute BP sitting:   130 / 86  (left arm) Cuff size:   large  Vitals Entered By: Shonna Chock (August 02, 2009 12:16 PM) CC: Lower abdominal and lower back pain x 24 hours-? UTI Comments REVIEWED MED LIST, PATIENT AGREED DOSE AND INSTRUCTION CORRECT    CC:  Lower abdominal and lower back pain x 24 hours-? UTI.  History of Present Illness: Onset 08/01/2009 as pain with straining to urinate with  L flank radiation. No radicular pain from back.Suprapubic pain  up to an 8,worse with full bladder.Rx: fluids with benefit. PMH of UTIs, last 2009.No PMH of calculi."Much better today ", no dysuria.  Allergies: 1)  ! Sulfa  Review of Systems General:  Denies chills, fever, and sweats. GU:  Denies abnormal vaginal bleeding, discharge, dysuria, hematuria, and incontinence. MS:  Complains of low back pain. Heme:  Denies abnormal bruising and bleeding.  Physical Exam  General:  well-nourished,in no acute distress; alert,appropriate and cooperative throughout examination Heart:  regular rhythm and bradycardia.   Abdomen:  Bowel sounds positive,abdomen soft and non-tender without masses, organomegaly noted.No L inguinal hernia Msk:  Minimal tenderness L flank; sat upw/o help Extremities:  Neg SLR Neurologic:  strength normal in lower extremities and DTRs symmetrical and normal.   Skin:  Intact without suspicious lesions or rashes Inguinal Nodes:  No significant adenopathy on L   Impression & Recommendations:  Problem # 1:  FLANK PAIN, LEFT (ICD-789.09)  Her updated medication list for this problem includes:    Baby Aspirin 81 Mg Chew (Aspirin) .Marland Kitchen... 1 by mouth once daily    Tramadol Hcl 50 Mg Tabs (Tramadol hcl) .Marland Kitchen... 1 every 6 hrs as needed pain  Orders: T-Culture, Urine ([id number])  Problem  # 2:  INGUINAL PAIN, LEFT (ICD-789.09) R/O L1 radicular etiology  Her updated medication list for this problem includes:    Baby Aspirin 81 Mg Chew (Aspirin) .Marland Kitchen... 1 by mouth once daily    Tramadol Hcl 50 Mg Tabs (Tramadol hcl) .Marland Kitchen... 1 every 6 hrs as needed pain  Orders: T-Culture, Urine ([id number])  Complete Medication List: 1)  Baby Aspirin 81 Mg Chew (Aspirin) .Marland Kitchen.. 1 by mouth once daily 2)  Bisoprolol-hydrochlorothiazide 5-6.25 Mg Tabs (Bisoprolol-hydrochlorothiazide) .... Once daily 3)  Ranitidine Hcl Caps (Ranitidine hcl caps) .... 150mg  two times a day as needed 4)  Lotensin 40 Mg Tabs (Benazepril hcl) .... 1/2-1 by mouth to maintain bp average < 130/85 5)  Multivitamin  6)  Vit E 400 Iu Qd  7)  Caltrate Bid  8)  Pravastatin 40 Mg Tabs (pravastatin Sodium)  .Marland Kitchen.. 1 by mouth once daily 9)  Vitamin D 1000 Unit Tabs (Cholecalciferol) .Marland Kitchen.. 1 by mouth once daily 10)  Fish Oil 1000 Mg Caps (Omega-3 fatty acids) .... Take one tablet two times a day 11)  Tramadol Hcl 50 Mg Tabs (Tramadol hcl) .Marland Kitchen.. 1 every 6 hrs as needed pain  Other Orders: UA Dipstick w/o Micro (automated)  (81003)  Patient Instructions: 1)  Drink as much fluid as you can tolerate for the next few days. Prescriptions: TRAMADOL HCL 50 MG TABS (TRAMADOL HCL) 1 every 6 hrs as needed pain  #30 x 1   Entered and Authorized by:  Marga Melnick MD   Signed by:   Marga Melnick MD on 08/02/2009   Method used:   Print then Give to Patient   RxID:   [id number]   Laboratory Results   Urine Tests    Routine Urinalysis   Color: lt. yellow Appearance: Clear Glucose: negative   (Normal Range: Negative) Bilirubin: negative   (Normal Range: Negative) Ketone: negative   (Normal Range: Negative) Spec. Gravity: <1.005   (Normal Range: 1.003-1.035) Blood: negative   (Normal Range: Negative) pH: 6.5   (Normal Range: 5.0-8.0) Protein: negative   (Normal Range: Negative) Urobilinogen: 0.2   (Normal Range:  0-1) Nitrite: negative   (Normal Range: Negative) Leukocyte Esterace: negative   (Normal Range: Negative)

## 2010-06-12 ENCOUNTER — Telehealth (INDEPENDENT_AMBULATORY_CARE_PROVIDER_SITE_OTHER): Payer: Self-pay | Admitting: *Deleted

## 2010-06-16 ENCOUNTER — Other Ambulatory Visit: Payer: Self-pay

## 2010-06-16 ENCOUNTER — Encounter: Payer: Self-pay | Admitting: Internal Medicine

## 2010-06-16 ENCOUNTER — Ambulatory Visit (INDEPENDENT_AMBULATORY_CARE_PROVIDER_SITE_OTHER)
Admission: RE | Admit: 2010-06-16 | Discharge: 2010-06-16 | Disposition: A | Discharge status: Home / Self Care | Payer: Self-pay | Source: Ambulatory Visit | Attending: Internal Medicine | Admitting: Internal Medicine

## 2010-06-16 ENCOUNTER — Other Ambulatory Visit: Payer: Self-pay | Admitting: Internal Medicine

## 2010-06-16 ENCOUNTER — Telehealth (INDEPENDENT_AMBULATORY_CARE_PROVIDER_SITE_OTHER): Payer: Self-pay | Admitting: *Deleted

## 2010-06-16 DIAGNOSIS — N959 Unspecified menopausal and perimenopausal disorder: Secondary | ICD-10-CM

## 2010-06-16 DIAGNOSIS — M949 Disorder of cartilage, unspecified: Secondary | ICD-10-CM

## 2010-06-16 DIAGNOSIS — M899 Disorder of bone, unspecified: Secondary | ICD-10-CM

## 2010-06-17 ENCOUNTER — Encounter: Payer: Self-pay | Admitting: Internal Medicine

## 2010-06-22 NOTE — Progress Notes (Signed)
Summary: Refill Request  Phone Note Refill Request Call back at [phone] Message from:  Pharmacy on June 12, 2010 8:48 AM  Refills Requested: Medication #1:  BISOPROLOL-HYDROCHLOROTHIAZIDE 5-6.25 MG  TABS once daily   Dosage confirmed as above?Dosage Confirmed   Supply Requested: 90   Last Refilled: 03/08/2010 [facility] on [address] in [place]  Next Appointment Scheduled: 3.29.12 Initial call taken by: Harold Barban,  June 12, 2010 8:48 AM    Prescriptions: BISOPROLOL-HYDROCHLOROTHIAZIDE 5-6.25 MG  TABS (BISOPROLOL-HYDROCHLOROTHIAZIDE) once daily  #90 x 2   Entered by:   Shonna Chock CMA   Authorized by:   Marga Melnick MD   Signed by:   Shonna Chock CMA on 06/12/2010   Method used:   [facility]       [address]       [place], [place]  [address]       Ph: [phone] or [phone]       Fax: [phone]   RxID:   [id number]

## 2010-06-22 NOTE — Miscellaneous (Signed)
Summary: Orders Update  Clinical Lists Changes  Problems: Added new problem of PERIMENOPAUSAL SYNDROME (ICD-627.9) Added new problem of POSTMENOPAUSAL SYNDROME (ICD-627.9) Orders: Added new Referral order of Radiology Referral (Radiology) - Signed

## 2010-06-22 NOTE — Progress Notes (Signed)
Summary: Order Request  Phone Note From Other Clinic   Caller: [facility] Summary of Call: Patient in office now and Belenda Cruise called yesterday and spoke with someone to get orders and did not receive yet. Patient needs order for BMD and Lumbar assessment (both test have to be ordered when ordering BMD)  [facility] CMA  June 16, 2010 8:56 AM

## 2010-07-11 ENCOUNTER — Encounter (INDEPENDENT_AMBULATORY_CARE_PROVIDER_SITE_OTHER): Payer: Self-pay | Admitting: *Deleted

## 2010-07-13 ENCOUNTER — Encounter: Payer: Self-pay | Admitting: *Deleted

## 2010-07-13 ENCOUNTER — Encounter: Payer: Self-pay | Admitting: Internal Medicine

## 2010-07-18 NOTE — Miscellaneous (Signed)
Summary: [facility] Previsit/prep  Clinical Lists Changes  Medications: Added new medication of MOVIPREP 100 GM  SOLR (PEG-KCL-NACL-NASULF-NA ASC-C) As per prep instructions. - Signed Rx of MOVIPREP 100 GM  SOLR (PEG-KCL-NACL-NASULF-NA ASC-C) As per prep instructions.;  #1 x 0;  Signed;  Entered by: Wyona Almas RN;  Authorized by: Iva Boop MD, [facility];  Method used: Electronically to [facility] [address], [place], [place], [place]  [address], Ph: [phone] or [phone], Fax: [phone] Observations: Added new observation of ALLERGY REV: Done (07/13/2010 10:21)    Prescriptions: MOVIPREP 100 GM  SOLR (PEG-KCL-NACL-NASULF-NA ASC-C) As per prep instructions.  #1 x 0   Entered by:   Wyona Almas RN   Authorized by:   Iva Boop MD, [facility]   Signed by:   Wyona Almas RN on 07/13/2010   Method used:   Electronically to        [facility] (retail)       [address]       [place], [place]  [address]       Ph: [phone] or [phone]       Fax: [phone]   RxID:   [id number]

## 2010-07-18 NOTE — Letter (Signed)
Summary: [facility] Instructions  [facility]  [address] [place], [place] [address]   Phone: [phone]  Fax: [phone]       Kim Fuller    [birth year]    MRN: [id number]        Procedure Day /Date: Thursday   07/27/10     Arrival Time: 8:00AM     Procedure Time: 9:00AM     Location of Procedure:                    _x _   [facility] ([facility]th [facility])                       PREPARATION FOR COLONOSCOPY WITH MOVIPREP   Starting 5 days prior to your procedure 07/22/10 do not eat nuts, seeds, popcorn, corn, beans, peas,  salads, or any raw vegetables.  Do not take any fiber supplements (e.g. Metamucil, Citrucel, and Benefiber).  THE DAY BEFORE YOUR PROCEDURE         DATE: 07/26/10    DAY: Wednesday  1.  Drink clear liquids the entire day-NO SOLID FOOD  2.  Do not drink anything colored red or purple.  Avoid juices with pulp.  No orange juice.  3.  Drink at least 64 oz. (8 glasses) of fluid/clear liquids during the day to prevent dehydration and help the prep work efficiently.  CLEAR LIQUIDS INCLUDE: Water Jello Ice Popsicles Tea (sugar ok, no milk/cream) Powdered fruit flavored drinks Coffee (sugar ok, no milk/cream) Gatorade Juice: apple, white grape, white cranberry  Lemonade Clear bullion, consomm, broth Carbonated beverages (any kind) Strained chicken noodle soup Hard Candy                             4.  In the morning, mix first dose of MoviPrep solution:    Empty 1 Pouch A and 1 Pouch B into the disposable container    Add lukewarm drinking water to the top line of the container. Mix to dissolve    Refrigerate (mixed solution should be used within 24 hrs)  5.  Begin drinking the prep at 5:00 p.m. The MoviPrep container is divided by 4 marks.   Every 15 minutes drink the solution down to the next mark (approximately 8 oz) until the full liter is complete.   6.  Follow completed prep with 16 oz of clear liquid of your choice (Nothing  red or purple).  Continue to drink clear liquids until bedtime.  7.  Before going to bed, mix second dose of MoviPrep solution:    Empty 1 Pouch A and 1 Pouch B into the disposable container    Add lukewarm drinking water to the top line of the container. Mix to dissolve    Refrigerate  THE DAY OF YOUR PROCEDURE      DATE: 07/27/10    DAY: Thursday  Beginning at 4:00AM (5 hours before procedure):         1. Every 15 minutes, drink the solution down to the next mark (approx 8 oz) until the full liter is complete.  2. Follow completed prep with 16 oz. of clear liquid of your choice.    3. You may drink clear liquids until 7:00AM (2 HOURS BEFORE PROCEDURE).   MEDICATION INSTRUCTIONS  Unless otherwise instructed, you should take regular prescription medications with a small sip of water   as early as possible the  morning of your procedure.   Additional medication instructions:  Hold Bisoprolol/HCTZ the morning of procedure.         OTHER INSTRUCTIONS  You will need a responsible adult at least 66 years of age to accompany you and drive you home.   This person must remain in the waiting room during your procedure.  Wear loose fitting clothing that is easily removed.  Leave jewelry and other valuables at home.  However, you may wish to bring a book to read or  an iPod/MP3 player to listen to music as you wait for your procedure to start.  Remove all body piercing jewelry and leave at home.  Total time from sign-in until discharge is approximately 2-3 hours.  You should go home directly after your procedure and rest.  You can resume normal activities the  day after your procedure.  The day of your procedure you should not:   Drive   Make legal decisions   Operate machinery   Drink alcohol   Return to work  You will receive specific instructions about eating, activities and medications before you leave.    The above instructions have been reviewed and  explained to me by   Wyona Almas RN  July 13, 2010 10:58 AM     I fully understand and can verbalize these instructions _____________________________ Date _________

## 2010-07-27 ENCOUNTER — Ambulatory Visit (AMBULATORY_SURGERY_CENTER): Payer: Medicare Other | Admitting: Internal Medicine

## 2010-07-27 ENCOUNTER — Encounter: Payer: Self-pay | Admitting: Internal Medicine

## 2010-07-27 VITALS — BP 126/74 | HR 52 | Temp 97.8°F | Resp 18

## 2010-07-27 DIAGNOSIS — Z8379 Family history of other diseases of the digestive system: Secondary | ICD-10-CM

## 2010-07-27 DIAGNOSIS — Z1211 Encounter for screening for malignant neoplasm of colon: Secondary | ICD-10-CM

## 2010-07-27 DIAGNOSIS — K5730 Diverticulosis of large intestine without perforation or abscess without bleeding: Secondary | ICD-10-CM

## 2010-07-27 DIAGNOSIS — K648 Other hemorrhoids: Secondary | ICD-10-CM

## 2010-07-27 NOTE — Patient Instructions (Signed)
Discharged instructions given. Verbalizes understanding. Information on Diverticulosis and Hemorrhoids given.

## 2010-07-28 ENCOUNTER — Telehealth: Payer: Self-pay | Admitting: *Deleted

## 2010-07-28 NOTE — Telephone Encounter (Signed)

## 2010-08-02 NOTE — Progress Notes (Signed)
Quick Note:  Hemorrhoids and diverticulosis ______

## 2010-08-03 ENCOUNTER — Ambulatory Visit (INDEPENDENT_AMBULATORY_CARE_PROVIDER_SITE_OTHER): Payer: Medicare Other | Admitting: Internal Medicine

## 2010-08-03 ENCOUNTER — Encounter: Payer: Self-pay | Admitting: Internal Medicine

## 2010-08-03 VITALS — BP 134/80 | HR 52 | Temp 98.4°F | Resp 14 | Ht 62.0 in | Wt 198.4 lb

## 2010-08-03 DIAGNOSIS — Z136 Encounter for screening for cardiovascular disorders: Secondary | ICD-10-CM

## 2010-08-03 DIAGNOSIS — I10 Essential (primary) hypertension: Secondary | ICD-10-CM

## 2010-08-03 DIAGNOSIS — K219 Gastro-esophageal reflux disease without esophagitis: Secondary | ICD-10-CM

## 2010-08-03 DIAGNOSIS — Z0000 Encounter for general adult medical examination without abnormal findings: Secondary | ICD-10-CM

## 2010-08-03 DIAGNOSIS — E782 Mixed hyperlipidemia: Secondary | ICD-10-CM

## 2010-08-03 DIAGNOSIS — M899 Disorder of bone, unspecified: Secondary | ICD-10-CM

## 2010-08-03 DIAGNOSIS — K5790 Diverticulosis of intestine, part unspecified, without perforation or abscess without bleeding: Secondary | ICD-10-CM

## 2010-08-03 MED ORDER — BENAZEPRIL HCL 40 MG PO TABS: 40.0000 mg | ORAL_TABLET | ORAL | Status: DC

## 2010-08-03 MED ORDER — PRAVASTATIN SODIUM 40 MG PO TABS: 40.0000 mg | ORAL_TABLET | Freq: Every day | ORAL | Status: DC

## 2010-08-03 NOTE — Progress Notes (Signed)
Subjective:    Patient ID: Kim Fuller, female    DOB: 04-07-1946, 65 y.o.   MRN: [id number]  HPI  Medicare Wellness Visit:  The following psychosocial & medical history were reviewed as required by Medicare.    social history including caffeine, alcohol,  tobacco use & exercise.    home safety, activity of daily living, seatbelt use , and smoke alarm employment    power of attorney/living will status :needed   hearing and vision evaluation ; see  VS; whisper heard @ 6 ft   orientation, memory and mental health assessment : She is oriented x3; memory recall is good; "WORLD" was  spelled backwards; mood and affect are normal.   travel history, immunization status , transfusion history, and preventive health surveillance assessment :   pertinent positives and negative items include: She does not drink. She smoked less than a pack a day from [phone]. She walks or does water aerobics 3 or 4 times per week.  There are no limitations to activities of daily living. She does wear her seatbelt; smoke alarms are present in the house. She has monitor of dental care every 6 months.  There are no home safety issues. She's never been having states; she never had a transfusion. Her preventive health care monitors are up to date.  The past medical history was reviewed and updated.  She had a colonoscopy last week which revealed diverticulosis. In 1998 she had a single polyp.  The family history was verified. Her father had lung cancer and had a heart attack in his 17s. Her sister had diabetes, glaucoma and lung cancer. One brother had hypertension. Mother had hypertension. Another sister had  osteoporosis, asthma and glaucoma. 2 sisters with colon polyps. Another sister had melanoma and had a colon resection for severe diverticulosis. Nephew had hemachromatosis.      Review of Systems Signs and symptoms of hypertension were reviewed. These included urinary frequency; fatigue;  lightheadedness; postural hypotension; chest pain; palpitations; dyspnea(rest, or  paroxysmal nocturnal); headache; epistaxis; ; claudication; and frank syncope. Additionally neurologic symptoms of weakness; parathesias and temporary paralysis were discussed. She does have some dyspnea climbing hills when walking. She also has some residual edema. Blood pressures average less than 120/85 at home.   She has no active symptoms related to her GERD. She does have occasional gas for which she takes Gas-X.  She's compliant with her statin; she is having no adverse effects. Specifically she denies any myalgias.       Objective:   Physical Exam on exam she appears healthy and well-nourished  She has no scleral icterus. Pupils are small but reactive to light.  Nares are patent without exudate  Otic canals are clear and tympanic membranes are normal.  Dental hygiene is good. The oropharynx is patent without exudate  Thyroid is normal to palpation.  Chest is clear without rales rhonchi or wheezes.  She does have a slow heart rate with slight splitting of the first heart sound. There is slight slurring without significant murmur, gallop, or rub.  Abdomen is nontender; she has no organomegaly or masses  There is full range of motion of extremities. She does have varicosities of the lower extremities with 1/2+ edema.         Assessment & Plan:  #1 Medicare wellness exam; no new or significant issues noted  #2 hypertension well controlled  #3 dyslipidemia, on statin therapy  #4 GERD with good control  #5 Osteopenia, bone mineral density study  is up to date.  Fasting labs will be collected at another date as she has eaten this morning.

## 2010-08-03 NOTE — Procedures (Addendum)
Summary: Colonoscopy  Patient: Kim Fuller Note: All result statuses are Final unless otherwise noted.  Tests: (1) Colonoscopy (COL)   COL Colonoscopy           DONE     [facility]     [address]     [place], [place]  [address]          COLONOSCOPY PROCEDURE REPORT          PATIENT:  Kim Fuller, Kim Fuller  MR#:  [id number]     BIRTHDATE:  1945-08-31, 64 yrs. old  GENDER:  female     ENDOSCOPIST:  Iva Boop, MD, [facility]          PROCEDURE DATE:  07/27/2010     PROCEDURE:  Average-risk screening colonoscopy     G0121     ASA CLASS:  Class II     INDICATIONS:  Routine Risk Screening does have family history of     polyps in sisters also     MEDICATIONS:   Fentanyl 75 mcg IV, Versed 8 mg IV          DESCRIPTION OF PROCEDURE:   After the risks benefits and     alternatives of the procedure were thoroughly explained, informed     consent was obtained.  Digital rectal exam was performed and     revealed no abnormalities.   The LB [id number] endoscope     was introduced through the anus and advanced to the cecum, which     was identified by both the appendix and ileocecal valve, without     limitations.  The quality of the prep was excellent, using     MoviPrep.  The instrument was then slowly withdrawn as the colon     was fully examined. Insertion: 3:32 minutes withdrawal: 11:14     minutes     <<PROCEDUREIMAGES>>          FINDINGS:  Moderate diverticulosis was found in the sigmoid colon.     This was otherwise a normal examination of the colon.     Retroflexed views in the rectum revealed internal hemorrhoids.     The scope was then withdrawn from the patient and the procedure     completed.          COMPLICATIONS:  None     ENDOSCOPIC IMPRESSION:     1) Moderate diverticulosis in the sigmoid colon     2) Internal hemorrhoids     3) Otherwise normal examination, excellent prep          REPEAT EXAM:  In 10 year(s) for routine screening colonoscopy.        Iva Boop, MD, [facility]          CC:  The Patient          n.     eSIGNED:   Iva Boop at 07/27/2010 09:50 AM          Renaee Munda, [id number]  Note: An exclamation mark (!) indicates a result that was not dispersed into the flowsheet. Document Creation Date: 08/02/2010 11:38 AM _______________________________________________________________________  (1) Order result status: Final Collection or observation date-time: 07/27/2010 09:35 Requested date-time:  Receipt date-time:  Reported date-time:  Referring Physician:   Ordering Physician: Stan Head [phone]) Specimen Source:  Source: Launa Grill Order Number: [id number] Lab site:

## 2010-08-03 NOTE — Patient Instructions (Addendum)
Please schedule fasting labs: Lipid panel; BMET; CBC and differential; hepatic panel; TSH; and vitamin D level. Codes: 272.4,401.9,995.20,733.90,562.10.  Because of GERD avoid food intake within 2-3 hours of going to bed. Triggers for reflux would be the aspirin family (Tylenol is okay), peppermint, and caffeine( coffee, tea, cola ,chocolate)

## 2010-08-10 ENCOUNTER — Other Ambulatory Visit: Payer: Self-pay | Admitting: Internal Medicine

## 2010-08-10 ENCOUNTER — Other Ambulatory Visit (INDEPENDENT_AMBULATORY_CARE_PROVIDER_SITE_OTHER): Payer: Medicare Other

## 2010-08-10 DIAGNOSIS — E785 Hyperlipidemia, unspecified: Secondary | ICD-10-CM

## 2010-08-10 DIAGNOSIS — M949 Disorder of cartilage, unspecified: Secondary | ICD-10-CM

## 2010-08-10 DIAGNOSIS — M899 Disorder of bone, unspecified: Secondary | ICD-10-CM

## 2010-08-10 DIAGNOSIS — T887XXA Unspecified adverse effect of drug or medicament, initial encounter: Secondary | ICD-10-CM

## 2010-08-10 DIAGNOSIS — I10 Essential (primary) hypertension: Secondary | ICD-10-CM

## 2010-08-10 LAB — LIPID PANEL
Cholesterol: 150 mg/dL (ref 0–200)
HDL: 40.4 mg/dL (ref >39.00)
LDL Cholesterol: 78 mg/dL (ref 0–99)
VLDL: 31.6 mg/dL (ref 0.0–40.0)

## 2010-08-10 LAB — CBC WITH DIFFERENTIAL/PLATELET
Basophils Absolute: 0 10*3/uL (ref 0.0–0.1)
Eosinophils Relative: 2.1 % (ref 0.0–5.0)
Lymphocytes Relative: 42.5 % (ref 12.0–46.0)
Monocytes Relative: 10.1 % (ref 3.0–12.0)
Platelets: 164 10*3/uL (ref 150.0–400.0)
RDW: 12.6 % (ref 11.5–14.6)
WBC: 4.8 10*3/uL (ref 4.5–10.5)

## 2010-08-10 LAB — BASIC METABOLIC PANEL
BUN: 14 mg/dL (ref 6–23)
Calcium: 9.1 mg/dL (ref 8.4–10.5)
GFR: 78.69 mL/min (ref >60.00)
Glucose, Bld: 86 mg/dL (ref 70–99)
Sodium: 141 mEq/L (ref 135–145)

## 2010-08-10 LAB — HEPATIC FUNCTION PANEL
Albumin: 3.7 g/dL (ref 3.5–5.2)
Total Protein: 6.8 g/dL (ref 6.0–8.3)

## 2010-08-11 LAB — VITAMIN D 25 HYDROXY (VIT D DEFICIENCY, FRACTURES): Vit D, 25-Hydroxy: 51 ng/mL (ref 30–89)

## 2010-08-22 ENCOUNTER — Encounter: Payer: Self-pay | Admitting: Internal Medicine

## 2010-09-19 NOTE — Op Note (Signed)
NAMEMAEVE, Kim Fuller                 ACCOUNT NO.:  [id number]   MEDICAL RECORD NO.:  [id number]          PATIENT TYPE:  AMB   LOCATION:  [facility]                         FACILITY:  [facility]   PHYSICIAN:  Marlowe Kays, M.D.  DATE OF BIRTH:  [birth year]   DATE OF PROCEDURE:  05/29/2007  DATE OF DISCHARGE:                               OPERATIVE REPORT   PREOPERATIVE DIAGNOSES:  1. Right carpal tunnel syndrome.  2. Status post left carpal tunnel release.   POSTOPERATIVE DIAGNOSES:  1. Right carpal tunnel syndrome.  2. Status post left carpal tunnel release.   OPERATION:  Decompression median nerve right wrist and hand.   SURGEON:  Marlowe Kays, MD.   ASSISTANT:  Nurse.   ANESTHESIA:  General.   FINAL JUSTIFICATION FOR PROCEDURE:  She has classic carpal tunnel  symptoms with pain and numbness over the median nerve distribution in  the hand.  At surgery, I found about a 2 cm area of significant  compression near the base of thenar eminence with the nerve discolored  and thinned out.  She also had some fibrous compression of some of the  branches of the median nerve in the distal palm.   PROCEDURE:  After satisfactory general anesthesia and pneumatic  tourniquet, the right upper extremity was esmarched out nonsterilely  with tourniquet inflated to 250 mmHg.  The right forearm was prepped  with DuraPrep, draped in a sterile field, time-out performed.  I marked  out a curved incision along the base of the thenar eminence, crossing  particularly over the flexor crease in the distal forearm.  A very small  palmaris longus tendon was identified and retracted to the radial side  beneath the median nerve was identified and the skin subcutaneous tissue  and fascia were released into the distal palm with the pathology noted  in the mid palm and distally.  Several of the nerve branches were  carefully dissected out with a small Hemostat.  Potential bleeders were  coagulated with bipolar  cautery.  At the completion of the case I  irrigated the wound well with sterile saline, closed the skin  subcutaneous tissue only with interrupted #4-0 nylon mass sutures.  Betadine __________ dry sterile dressing with volar plaster splint were  applied.  Tourniquet was released.  At the time of this dictation she  was on her way to Recovery in satisfactory condition with no known  complications.           ______________________________  Marlowe Kays, M.D.    JA/MEDQ  D:  05/29/2007  T:  05/29/2007  Job:  [id number]

## 2010-09-22 NOTE — Op Note (Signed)
Kim Fuller, Kim Fuller                 ACCOUNT NO.:  [id number]   MEDICAL RECORD NO.:  [id number]          PATIENT TYPE:  AMB   LOCATION:  [facility]                         FACILITY:  [facility]   PHYSICIAN:  Marlowe Kays, M.D.  DATE OF BIRTH:  05-18-1945   DATE OF PROCEDURE:  08/07/2005  DATE OF DISCHARGE:                                 OPERATIVE REPORT   PREOPERATIVE DIAGNOSIS:  1.  Left carpal tunnel syndrome.  2.  Left trigger thumb.   POSTOPERATIVE DIAGNOSIS:  1.  Left carpal tunnel syndrome.  2.  Left trigger thumb.   OPERATION:  1.  Left carpal tunnel release.  2.  Release left trigger thumb.   SURGEON:  Marlowe Kays, M.D.   ASSISTANT:  Nurse   ANESTHESIA:  General.   INDICATIONS FOR PROCEDURE:  She has had progressive carpal tunnel symptoms  bilaterally with nerve conduction studies showing severe bilateral carpal  tunnel syndrome.  She also has pain and triggering in her left thumb and it  was elected to perform both surgeries on the left side at this time of  surgery.   PROCEDURE:  Satisfactory general anesthesia, pneumatic tourniquet, left  upper extremity was Esmarch nonsterilely and prepped with DuraPrep from mid  forearm to fingertips and draped in a sterile field.  I first performed the  carpal tunnel surgery with a curved incision along the base of thenar  eminence crossing obliquely over the flexor crease of wrist on the distal  forearm.  The palmaris longus tendon was identified and retracted to the  ulnar side. Beneath, the median nerve was identified and protected.  The  overlying skin, subcutaneous tissue and fascia were released into the distal  palm. In the mid palm, she had significant compression of the median nerve.  Potential bleeders were coagulated bipolar cautery.  The wound was then  irrigated with sterile saline and the skin and subcutaneous tissue only  closed with interrupted 4-0 nylon mast sutures.   I then made a horizontal V-type incision  for the trigger thumb release with  the apex on the radial side of the MP joint.  This gave me better exposure  in her small hand. The neurovascular bundles were protected on either side  and working in the midline, mainly with blunt dissection, the A1 pulley was  opened and the flexor tendon sheath proximal to it with small scissors.  The  A1 pulley was opened with 15 knife blade and then the tendon sheath proximal  to this with small scissors.  She had some fairly distinct adhesions between  the flexor tendon and the overlying tissues at this level.  I then released  the A1 pulley with a combination of 15 knife blade and small scissors until  the impingement had been completely released.  I also released a small  portion of the pulley.  This allowed the thumb passively to have a full  excursion of the flexor tendons.  This wound was also well irrigated with  sterile saline  and the skin and subcutaneous tissues were reapproximated with interrupted 4-  0 nylon mast  sutures.  Dry, sterile dressing and a volar plaster splint with  the dressing incorporating the thumb were applied.  Tourniquet was released.  She tolerated the procedure well and was taken to the recovery room in  satisfactory condition with no complications.           ______________________________  Marlowe Kays, M.D.     JA/MEDQ  D:  08/07/2005  T:  08/07/2005  Job:  [id number]

## 2010-09-22 NOTE — H&P (Signed)
NAMETAIA, BRAMLETT NO.:  [id number]   MEDICAL RECORD NO.:  [id number]          PATIENT TYPE:  OBV   LOCATION:  0101                         FACILITY:  [facility]   PHYSICIAN:  Rosalyn Gess. Norins, MD  DATE OF BIRTH:  1945/05/31   DATE OF ADMISSION:  03/05/2006  DATE OF DISCHARGE:                                HISTORY & PHYSICAL   A 24-hour observation note.   CHIEF COMPLAINT:  Chest pain.   HISTORY OF PRESENT ILLNESS:  Ms. Creegan is a very pleasant 65 year old woman  with a history of hypertension, hyperlipidemia, history of mitral valve  prolapse who reports that she has had a 1-week history of midsternal chest  discomfort described as pressure with intermittent nausea. She denies any  shortness of breath.  The patient reports this is similar to episodes of  indigestion that she has had in the past. She does get temporary relief with  Pepto-Bismol and Rolaids. She reports that over the last two days her pain  is becoming more severe. However, she was able to walk 45 minutes covering  almost three miles on October 29 in the early a.m. in cold weather without  any significant increased chest pain or need to stop for rest. On the p.m.  of March 04, 2006 she reports her discomfort became somewhat worse, and  she had a rather uncomfortable night and because of these symptoms presents  to the emergency department. She has no change in her symptoms with eating.  She had one episode of dizziness yesterday after prolonged standing at work.  She has noticed an increased fatigue over the past week. She denies any  fever, sweats or chills. She has had no night sweats.   CARDIAC RISK FACTORS:  Postmenopausal, overweight, hyperlipidemia,  hypertension. Negative for diabetes.   FAMILY HISTORY:  Tobacco abuse.   PAST SURGICAL HISTORY:  1. BTL.  2. Carpal tunnel release on April 2007.   PAST MEDICAL HISTORY:  1. Usual childhood diseases.  2. Hypertension.  3.  Hyperlipidemia.  4. Mitral valve prolapse.   MEDICATIONS:  1. Bisoprolol HCTZ unspecified dose.  2. Aspirin 81 mg daily.  3. Centrum Silver.  4. Red yeast rice.  5. Fish oil.  6. Vitamin B.  7. Caltrate.   DRUG ALLERGIES:  Sulfa.   FAMILY HISTORY:  Mother with diverticulosis, COPD. Father with metastatic CA  of unknown origin. Sister with lung cancer. No family history for heart  disease.   SOCIAL HISTORY:  The patient is married 42 years. She has two children, one  boy, one girl. She has four grandchildren, three grandsons, one  granddaughter. The patient works as a Production assistant, radio at [employer]. Her marriage  is in good health.   HABITS:  Tobacco none, alcohol none.   PHYSICAL EXAMINATION:  VITAL SIGNS:  Temperature was 97.9, blood pressure  111/45, heart rate 57, O2 sat 97% on room air, respiratory rates 20.  GENERAL APPEARANCE:  This is a well-nourished, well-developed, overweight  Caucasian woman lying in bed in no acute distress.  HEENT:  Grossly unremarkable.  NECK:  Supple.  CHEST:  No CVA tenderness.  LUNGS:  Clear with no rales, wheezes or rhonchi.  CARDIOVASCULAR:  2+ radial pulses. No JVD, no carotid bruit. She had a quiet  precordium. She had a regular rate and rhythm with no murmurs, rubs or  gallops appreciated.  ABDOMEN:  Soft. No guarding or rebound. She had no significant tenderness to  deep palpation in the epigastric region. No tenderness in the lower abdomen.  GENITORECTAL:  Rectal and genitalia exams are deferred.  EXTREMITIES:  With no clubbing or cyanosis. She does have 1+ lower extremity  edema.  SKIN:  Clear.  NEUROLOGIC:  Nonfocal.   LABORATORY DATA:  Hemoglobin 14.3 g, white count 5200 with normal  differential. Chemistries with sodium 140, potassium 4, chloride 104, CO2  30, BUN 13, creatinine 0.7. Point of care cardiac labs negative in the ED.   ASSESSMENT/PLAN:  Cardiovascular:  Patient with atypical chest pain,  although having increasing  symptoms. She had a normal EKG. Had negative  point of care enzymes. The patient is able to exercise vigorously with no  need to stop her exercise or increased chest pain. However, she has had some  increased weakness and more discomfort in her chest in the last 24 hours.  The patient does have moderate risk factors as noted.   PLAN:  1. The patient is admitted on 24-hour observation. Will cycle cardiac      enzymes q.8 x3. Cardiac enzymes are negative, and EKG is unchanged. The      patient will have a nuclear stress test at [facility] March 06, 2006 at 11:45 a.m..  2. Hypertension controlled. The patient will continue her home      medications.  3. Hyperlipidemia. The patient is using red yeast rice which is a statin      product. Has had follow-up labs with Dr. Alwyn Ren.           ______________________________  Rosalyn Gess Norins, MD     MEN/MEDQ  D:  03/05/2006  T:  03/06/2006  Job:  [id number]   cc:   Titus Dubin. Alwyn Ren, MD,FACP,FCCP  [phone] [place]  [place] [address]   [facility]

## 2010-09-22 NOTE — Assessment & Plan Note (Signed)
[facility]                                 ON-CALL NOTE   REEYA, BOUND                          MRN:          [id number]  DATE:06/20/2006                            DOB:          January 10, 1946    CALLER:  Ferne Reus from [facility].   PHONE NUMBER:  [phone]   PRIMARY CARE PHYSICIAN:  Titus Dubin. Hopper, MD,FACP,FCCP   SUBJECTIVE:  Clarification of Phenergan suppository dose medical  response specific 25 mg per suppository dose.     Kerby Nora, MD  Electronically Signed    AB/MedQ  DD: 06/20/2006  DT: 06/20/2006  Job #: [id number]

## 2010-09-22 NOTE — Discharge Summary (Signed)
NAMECASILDA, Kim Fuller                 ACCOUNT NO.:  [id number]   MEDICAL RECORD NO.:  [id number]          PATIENT TYPE:  OBV   LOCATION:  1408                         FACILITY:  [facility]   PHYSICIAN:  Rosalyn Gess. Norins, MD  DATE OF BIRTH:  12/01/1945   DATE OF ADMISSION:  03/05/2006  DATE OF DISCHARGE:  03/06/2006                                 DISCHARGE SUMMARY   ADMITTING DIAGNOSIS:  Atypical chest pain.   DISCHARGE DIAGNOSIS:  Atypical chest pain.   HISTORY OF PRESENT ILLNESS:  The patient presented to the emergency  department on March 05, 2006, with a 1-week history of chest pressure and  discomfort which of had been getting progressively worse.  Please see the  admission note for full details, past medical history, family history and  social history.   HOSPITAL COURSE:  The patient was admitted overnight to a telemetry unit.  She had no significant arrhythmias or abnormalities..  Cardiac enzymes were  cycled through with two results back.  Her CK was 62 with a troponin 0.7.  Second CK was 68 with a troponin of 0.7.  The lab work included a lipid  panel with a cholesterol 205, triglycerides 185, HDL was 37, LDL was 131.   With the patient having ruled out negative cardiac enzymes x2 being  asymptomatic, she is now for discharge.   DISCHARGE EXAMINATION:  VITAL SIGNS:  Temperature 97.1, blood pressure  152/75, pulse 61, respirations 20, O2 sats 94%.  GENERAL APPEARANCE:  This a well-nourished woman sitting up in bed in no  acute distress.  CHEST:  Clear with no rales, wheezes or rhonchi.  CARDIOVASCULAR:  With 2+ radial pulses.  She had a quiet precordium with a  regular rate and rhythm without murmurs, rubs or gallops.  ABDOMEN:  Nontender.   DISPOSITION:  The patient is discharged home.   1. She has a nuclear stress test scheduled this morning at 11:45 at      [facility].  She is aware of this and instructed to report      there at 11:30.  2. The patient will  resume all her home medications.  3. She needs to follow up Dr. Lona Kettle in regards to medication      adjustment.  4. She may want to consider of moving from red yeast rice to a      prescription Statin product for better results in regards to her      cholesterol.   The patient's condition at time of discharge dictation is stable.           ______________________________  Rosalyn Gess. Norins, MD     MEN/MEDQ  D:  03/06/2006  T:  03/06/2006  Job:  [id number]   cc:   Titus Dubin. Alwyn Ren, MD,FACP,FCCP  [phone] [place]  [place] [address]

## 2010-12-26 ENCOUNTER — Other Ambulatory Visit: Payer: Self-pay | Admitting: Gynecology

## 2010-12-26 DIAGNOSIS — Z1231 Encounter for screening mammogram for malignant neoplasm of breast: Secondary | ICD-10-CM

## 2011-01-25 LAB — I-STAT 8, (EC8 V) (CONVERTED LAB)
Acid-Base Excess: 4 — ABNORMAL HIGH
Bicarbonate: 27 — ABNORMAL HIGH
HCT: 45
Hemoglobin: 15.3 — ABNORMAL HIGH
Operator id: 114531
Sodium: 137
TCO2: 28
pCO2, Ven: 34.1 — ABNORMAL LOW

## 2011-02-08 ENCOUNTER — Ambulatory Visit
Admission: RE | Admit: 2011-02-08 | Discharge: 2011-02-08 | Disposition: A | Discharge status: Home / Self Care | Payer: Medicare Other | Source: Ambulatory Visit | Attending: Gynecology | Admitting: Gynecology

## 2011-02-08 DIAGNOSIS — Z1231 Encounter for screening mammogram for malignant neoplasm of breast: Secondary | ICD-10-CM

## 2011-03-12 ENCOUNTER — Other Ambulatory Visit: Payer: Self-pay | Admitting: Internal Medicine

## 2011-09-05 ENCOUNTER — Other Ambulatory Visit: Payer: Self-pay | Admitting: Internal Medicine

## 2011-09-18 ENCOUNTER — Other Ambulatory Visit: Payer: Self-pay | Admitting: Internal Medicine

## 2011-11-01 ENCOUNTER — Ambulatory Visit (INDEPENDENT_AMBULATORY_CARE_PROVIDER_SITE_OTHER): Payer: Medicare Other | Admitting: Internal Medicine

## 2011-11-01 ENCOUNTER — Encounter: Payer: Self-pay | Admitting: Internal Medicine

## 2011-11-01 VITALS — BP 118/70 | HR 49 | Temp 98.9°F | Resp 12 | Ht 61.08 in | Wt 187.6 lb

## 2011-11-01 DIAGNOSIS — M949 Disorder of cartilage, unspecified: Secondary | ICD-10-CM

## 2011-11-01 DIAGNOSIS — I10 Essential (primary) hypertension: Secondary | ICD-10-CM | POA: Diagnosis not present

## 2011-11-01 DIAGNOSIS — E782 Mixed hyperlipidemia: Secondary | ICD-10-CM | POA: Diagnosis not present

## 2011-11-01 DIAGNOSIS — N959 Unspecified menopausal and perimenopausal disorder: Secondary | ICD-10-CM

## 2011-11-01 DIAGNOSIS — K219 Gastro-esophageal reflux disease without esophagitis: Secondary | ICD-10-CM | POA: Diagnosis not present

## 2011-11-01 DIAGNOSIS — M899 Disorder of bone, unspecified: Secondary | ICD-10-CM

## 2011-11-01 DIAGNOSIS — Z0000 Encounter for general adult medical examination without abnormal findings: Secondary | ICD-10-CM | POA: Diagnosis not present

## 2011-11-01 LAB — CBC WITH DIFFERENTIAL/PLATELET
Eosinophils Relative: 1.5 % (ref 0.0–5.0)
Lymphocytes Relative: 37.2 % (ref 12.0–46.0)
Monocytes Absolute: 0.5 10*3/uL (ref 0.1–1.0)
Monocytes Relative: 9.2 % (ref 3.0–12.0)
Neutrophils Relative %: 51.5 % (ref 43.0–77.0)
Platelets: 177 10*3/uL (ref 150.0–400.0)
WBC: 5.8 10*3/uL (ref 4.5–10.5)

## 2011-11-01 LAB — LIPID PANEL
Total CHOL/HDL Ratio: 4
Triglycerides: 258 mg/dL — ABNORMAL HIGH (ref 0.0–149.0)

## 2011-11-01 LAB — BASIC METABOLIC PANEL
BUN: 18 mg/dL (ref 6–23)
Calcium: 10.2 mg/dL (ref 8.4–10.5)
Creatinine, Ser: 0.7 mg/dL (ref 0.4–1.2)
GFR: 83.3 mL/min (ref >60.00)

## 2011-11-01 LAB — HEPATIC FUNCTION PANEL
ALT: 19 U/L (ref 0–35)
Bilirubin, Direct: 0 mg/dL (ref 0.0–0.3)
Total Bilirubin: 1 mg/dL (ref 0.3–1.2)

## 2011-11-01 MED ORDER — BENAZEPRIL HCL 40 MG PO TABS: ORAL_TABLET | ORAL | Status: DC

## 2011-11-01 MED ORDER — BISOPROLOL-HYDROCHLOROTHIAZIDE 5-6.25 MG PO TABS: ORAL_TABLET | ORAL | Status: DC

## 2011-11-01 MED ORDER — PRAVASTATIN SODIUM 40 MG PO TABS: ORAL_TABLET | ORAL | Status: DC

## 2011-11-01 MED ORDER — MUPIROCIN 2 % EX OINT: TOPICAL_OINTMENT | CUTANEOUS | Status: AC

## 2011-11-01 NOTE — Progress Notes (Signed)
Subjective:    Patient ID: Kim Fuller, female    DOB: [birth year], 66 y.o.   MRN: [id number]  HPI Medicare Wellness Visit:  The following psychosocial & medical history were reviewed as required by Medicare.   Social history: caffeine: 1 soda/day , alcohol:  no ,  tobacco use : quit 1972  & exercise : walking 60 min 4X/ week.   Home & personal  safety / fall risk:no issues, activities of daily living: no limitations , seatbelt use : yes , and smoke alarm employment : yes .  Power of Attorney/Living Will status : NO  Vision ( as recorded per Nurse) & Hearing  evaluation : Ophth exam every Sept; see exam. Orientation :oriented X3 , memory & recall :good,  math testing:good,and mood & affect : normal . Depression / anxiety: denied Travel history : never , immunization status :Shingles needed , transfusion history:  no, and preventive health surveillance ( colonoscopies, BMD , etc as per protocol/ [facility]): colonoscopy up to date, Dental care:  Every 6 mos  . Chart reviewed &  Updated. Active issues reviewed & addressed.       Review of Systems HYPERTENSION: Disease Monitoring: Blood pressure range-118-120/70+  Chest pain, palpitations- no       Dyspnea- no Medications: Compliance-no  Lightheadedness,Syncope- no    Edema- with prolonged standing  HYPERLIPIDEMIA: Disease Monitoring: See symptoms for Hypertension Medications: Compliance- yes Abd pain, bowel changes-she describes occasional "twinge" in the left lower quadrant. Colonoscopy was -2012. She saw her gynecologist in June 2012. Muscle aches- no Polyuria/phagia/dipsia-no     Visual problems-no  Her reflux is asymptomatic without dysphagia, melena, rectal bleeding .  She was bitten over the left lateral hip 1 month ago by an unknown vector. There's been some residual eschar and cellulitis     Objective:   Physical Exam Gen.:  well-nourished in appearance. Alert, appropriate and cooperative throughout exam. Head:  Normocephalic without obvious abnormalities  Eyes: No corneal or conjunctival inflammation noted. Pupils equal round reactive to light and accommodation.  Extraocular motion intact. Vision grossly normal. Ears: External  ear exam reveals no significant lesions or deformities. Canals clear .TMs normal. Hearing is grossly normal bilaterally. Nose: External nasal exam reveals no deformity or inflammation. Nasal mucosa are pink and moist. No lesions or exudates noted.  Mouth: Oral mucosa and oropharynx reveal no lesions or exudates. Teeth in good repair. Neck: No deformities, masses, or tenderness noted. Range of motion & Thyroid normal. Lungs: Normal respiratory effort; chest expands symmetrically. Lungs are clear to auscultation without rales, wheezes, or increased work of breathing. Heart: Normal rate and rhythm. Normal S1 and S2. No gallop, click, or rub. S4 w/o murmur. Abdomen: Bowel sounds normal; abdomen soft and nontender. No masses, organomegaly or hernias noted. Genitalia: I've asked to see Dr. Greta Doom if the left lower quadrant symptoms recur                                                                               Musculoskeletal/extremities: Mild lordosis  noted of  the thoracic  spine. No clubbing, cyanosis, edema, or deformity noted. Range of motion  normal .Tone & strength  normal.Joints normal. Nail health  good. Vascular: Carotid, radial artery, dorsalis pedis and  posterior tibial pulses are full and equal. No bruits present. Varicose veins of the lower extremities; right lower extremity greater than left lower extremity. Associated venous spiders Neurologic: Alert and oriented x3. Deep tendon reflexes symmetrical and normal.          Skin: Intact without suspicious lesions or rashes. Tiny eschar left hip with mild cellulitis around the lesion. Lymph: No cervical, axillary lymphadenopathy present. Psych: Mood and affect are normal. Normally interactive                                                                                          Assessment & Plan:  #1 Medicare Wellness Exam; criteria met ; data entered #2 Problem List reviewed ; Assessment/ Recommendations made Plan: see Orders

## 2011-11-01 NOTE — Patient Instructions (Addendum)
Preventive Health Care: Exercise  30-45  minutes a day, 3-4 days a week. Walking is especially valuable in preventing Osteoporosis. Eat a low-fat diet with lots of fruits and vegetables, up to 7-9 servings per day.  Consume less than 30 grams of sugar per day from foods & drinks with High Fructose Corn Syrup as #1,2,3 or #4 on label. Health Care Power of Attorney & Living Will place you in charge of your health care  decisions. Verify these are  in place. Dip gauze in  sterile saline and applied to the wound twice a day ; remove when almost dry then apply the antibiotic ointment. The saline can be purchased at the drugstore or you can make your own .Boil cup of salt in a gallon of water. Store mixture  in a clean container.Report Warning  signs as discussed (red streaks, pus, fever, increasing pain).  Please try to go on My Chart within the next 24 hours to allow me to release the results directly to you.

## 2011-12-13 DIAGNOSIS — N949 Unspecified condition associated with female genital organs and menstrual cycle: Secondary | ICD-10-CM | POA: Diagnosis not present

## 2011-12-27 ENCOUNTER — Other Ambulatory Visit: Payer: Self-pay | Admitting: Gynecology

## 2011-12-27 DIAGNOSIS — K5792 Diverticulitis of intestine, part unspecified, without perforation or abscess without bleeding: Secondary | ICD-10-CM

## 2011-12-27 DIAGNOSIS — R1032 Left lower quadrant pain: Secondary | ICD-10-CM

## 2011-12-27 DIAGNOSIS — N949 Unspecified condition associated with female genital organs and menstrual cycle: Secondary | ICD-10-CM | POA: Diagnosis not present

## 2012-01-03 ENCOUNTER — Other Ambulatory Visit: Payer: Self-pay | Admitting: Gynecology

## 2012-01-03 ENCOUNTER — Ambulatory Visit
Admission: RE | Admit: 2012-01-03 | Discharge: 2012-01-03 | Disposition: A | Discharge status: Home / Self Care | Payer: Medicare Other | Source: Ambulatory Visit | Attending: Gynecology | Admitting: Gynecology

## 2012-01-03 DIAGNOSIS — N200 Calculus of kidney: Secondary | ICD-10-CM | POA: Diagnosis not present

## 2012-01-03 DIAGNOSIS — K5792 Diverticulitis of intestine, part unspecified, without perforation or abscess without bleeding: Secondary | ICD-10-CM

## 2012-01-03 DIAGNOSIS — R1032 Left lower quadrant pain: Secondary | ICD-10-CM

## 2012-01-03 DIAGNOSIS — Z1231 Encounter for screening mammogram for malignant neoplasm of breast: Secondary | ICD-10-CM

## 2012-01-03 MED ORDER — IOHEXOL 300 MG/ML  SOLN
100.0000 mL | Freq: Once | INTRAMUSCULAR | Status: AC | PRN
  Administered 2012-01-03: 100 mL via INTRAVENOUS

## 2012-02-14 ENCOUNTER — Ambulatory Visit
Admission: RE | Admit: 2012-02-14 | Discharge: 2012-02-14 | Disposition: A | Discharge status: Home / Self Care | Payer: Medicare Other | Source: Ambulatory Visit | Attending: Gynecology | Admitting: Gynecology

## 2012-02-14 DIAGNOSIS — Z1231 Encounter for screening mammogram for malignant neoplasm of breast: Secondary | ICD-10-CM | POA: Diagnosis not present

## 2012-02-21 DIAGNOSIS — H1030 Unspecified acute conjunctivitis, unspecified eye: Secondary | ICD-10-CM | POA: Diagnosis not present

## 2012-02-29 DIAGNOSIS — Z23 Encounter for immunization: Secondary | ICD-10-CM | POA: Diagnosis not present

## 2012-03-06 DIAGNOSIS — Z961 Presence of intraocular lens: Secondary | ICD-10-CM | POA: Diagnosis not present

## 2012-06-21 ENCOUNTER — Other Ambulatory Visit: Payer: Self-pay

## 2012-09-25 DIAGNOSIS — L821 Other seborrheic keratosis: Secondary | ICD-10-CM | POA: Diagnosis not present

## 2012-12-09 ENCOUNTER — Other Ambulatory Visit: Payer: Self-pay | Admitting: Internal Medicine

## 2012-12-10 ENCOUNTER — Other Ambulatory Visit: Payer: Self-pay

## 2012-12-10 NOTE — Telephone Encounter (Signed)
Pending appointment 01/2013 

## 2013-01-06 ENCOUNTER — Other Ambulatory Visit: Payer: Self-pay | Admitting: Internal Medicine

## 2013-01-07 NOTE — Telephone Encounter (Signed)
Med filled pt has an appt end of September.

## 2013-01-16 ENCOUNTER — Other Ambulatory Visit: Payer: Self-pay

## 2013-01-16 DIAGNOSIS — Z1231 Encounter for screening mammogram for malignant neoplasm of breast: Secondary | ICD-10-CM

## 2013-01-28 ENCOUNTER — Telehealth: Payer: Self-pay

## 2013-01-28 NOTE — Telephone Encounter (Signed)
HM-Patient request pneumonia vaccine. Will get flu vaccine in October. Mammogram and Women's wellness scheduled in Oct 2014 Bone Density-11/2010 Reconciled Medications and verified allergies and pharmacy

## 2013-01-28 NOTE — Telephone Encounter (Signed)
LM with spouse for return call. HM-updated Due for Flu, Shingles (?) Last mamogram 02/2012 Last bone density 11/2010

## 2013-01-29 ENCOUNTER — Ambulatory Visit (INDEPENDENT_AMBULATORY_CARE_PROVIDER_SITE_OTHER): Payer: Medicare Other | Admitting: Internal Medicine

## 2013-01-29 ENCOUNTER — Encounter: Payer: Self-pay | Admitting: Internal Medicine

## 2013-01-29 VITALS — BP 147/70 | HR 52 | Temp 98.4°F | Ht 62.0 in | Wt 197.2 lb

## 2013-01-29 DIAGNOSIS — K219 Gastro-esophageal reflux disease without esophagitis: Secondary | ICD-10-CM | POA: Diagnosis not present

## 2013-01-29 DIAGNOSIS — Z23 Encounter for immunization: Secondary | ICD-10-CM | POA: Diagnosis not present

## 2013-01-29 DIAGNOSIS — E782 Mixed hyperlipidemia: Secondary | ICD-10-CM

## 2013-01-29 DIAGNOSIS — Z0000 Encounter for general adult medical examination without abnormal findings: Secondary | ICD-10-CM

## 2013-01-29 DIAGNOSIS — I10 Essential (primary) hypertension: Secondary | ICD-10-CM | POA: Diagnosis not present

## 2013-01-29 DIAGNOSIS — D126 Benign neoplasm of colon, unspecified: Secondary | ICD-10-CM

## 2013-01-29 DIAGNOSIS — M899 Disorder of bone, unspecified: Secondary | ICD-10-CM

## 2013-01-29 LAB — BASIC METABOLIC PANEL
BUN: 17 mg/dL (ref 6–23)
CO2: 31 mEq/L (ref 19–32)
Calcium: 9.7 mg/dL (ref 8.4–10.5)
Glucose, Bld: 89 mg/dL (ref 70–99)
Potassium: 4 mEq/L (ref 3.5–5.1)
Sodium: 138 mEq/L (ref 135–145)

## 2013-01-29 LAB — LIPID PANEL
Cholesterol: 181 mg/dL (ref 0–200)
HDL: 45.3 mg/dL (ref >39.00)
Triglycerides: 182 mg/dL — ABNORMAL HIGH (ref 0.0–149.0)
VLDL: 36.4 mg/dL (ref 0.0–40.0)

## 2013-01-29 LAB — HEPATIC FUNCTION PANEL
Albumin: 4 g/dL (ref 3.5–5.2)
Total Bilirubin: 1 mg/dL (ref 0.3–1.2)

## 2013-01-29 LAB — CBC WITH DIFFERENTIAL/PLATELET
Basophils Absolute: 0 10*3/uL (ref 0.0–0.1)
Eosinophils Absolute: 0.1 10*3/uL (ref 0.0–0.7)
HCT: 40.9 % (ref 36.0–46.0)
Hemoglobin: 14 g/dL (ref 12.0–15.0)
Lymphs Abs: 1.8 10*3/uL (ref 0.7–4.0)
MCHC: 34.2 g/dL (ref 30.0–36.0)
Neutro Abs: 3 10*3/uL (ref 1.4–7.7)
Platelets: 171 10*3/uL (ref 150.0–400.0)
RDW: 12.7 % (ref 11.5–14.6)

## 2013-01-29 NOTE — Progress Notes (Signed)
Subjective:    Patient ID: Kim Fuller, female    DOB: [birth year], 67 y.o.   MRN: [id number]  HPI Medicare Wellness Visit:  Psychosocial & medical history were reviewed as required by Medicare (abuse,antisocial behavioral risks,firearm risk).  Social history: caffeine: 1 diet cola / day  , alcohol: no  ,  tobacco use:   Quit 1972 Exercise :  See below Home & personal  safety / fall risk:no Limitations of activities of daily living:no Seatbelt  and smoke alarm use:yes Power of Attorney/Living Will status : needed Ophthalmology exam status :current Hearing evaluation status:not current Orientation :oriented X 3 Memory & recall : good Math testing:good Active depression / anxiety:no Foreign travel history : never Immunization status for Shingles /Flu/ PNA/ tetanus : ; ? Shingles needed ;Flu needed Transfusion history:  no Preventive health surveillance status of colonoscopy, BMD , mammograms,PAP as per protocol/ SOC: BMD due Dental care:  Every 6 mos Chart reviewed &  Updated. Active issues reviewed & addressed.      Review of Systems  She is on a heart healthy diet; she exercises as walking 40-60 minutes 5  times per week without symptoms. Specifically she denies chest pain, palpitations, dyspnea, or claudication. Family history is negative for premature coronary disease . Advanced cholesterol testing reveals her LDL goal is less than 100. There is medication compliance with the statin. Significant abdominal symptoms, memory deficit, or myalgias denied. BP @home  105/60s-134/<70 .     Objective:   Physical Exam Gen.:  well-nourished in appearance. Alert, appropriate and cooperative throughout exam.Appears younger than stated age  Head: Normocephalic without obvious abnormalities  Eyes: No corneal or conjunctival inflammation noted. Pupils equal round reactive to light and accommodation.  Extraocular motion intact. Vision grossly normal with lenses Ears: External  ear exam  reveals no significant lesions or deformities. Canals clear .TMs normal. Hearing is grossly normal bilaterally. Nose: External nasal exam reveals no deformity or inflammation. Nasal mucosa are pink and moist. No lesions or exudates noted.  Mouth: Oral mucosa and oropharynx reveal no lesions or exudates. Teeth in good repair. Neck: No deformities, masses, or tenderness noted. Range of motion & Thyroid normal Lungs: Normal respiratory effort; chest expands symmetrically. Lungs are clear to auscultation without rales, wheezes, or increased work of breathing. Heart: Slow rate and regular rhythm. Normal S1 and S2. No gallop, click, or rub. S4 with slurring w/o murmur. Abdomen: Bowel sounds normal; abdomen soft and nontender. No masses, organomegaly or hernias noted. Genitalia: As per Gyn                                  Musculoskeletal/extremities: No deformity or scoliosis noted of  the thoracic or lumbar spine.   No clubbing, cyanosis, edema, or significant extremity  deformity noted. Range of motion normal .Tone & strength  Normal. Joints normal. Nail health good. Able to lie down & sit up w/o help. Negative SLR bilaterally Vascular: Carotid, radial artery, dorsalis pedis and  posterior tibial pulses are full and equal. No bruits present. Neurologic: Alert and oriented x3. Deep tendon reflexes symmetrical and normal.         Skin: Intact without suspicious lesions or rashes. Lymph: No cervical, axillary lymphadenopathy present. Psych: Mood and affect are normal. Normally interactive  Assessment & Plan:  #1 Medicare Wellness Exam; criteria met ; data entered #2 Problem List/Diagnoses reviewed Plan:  Assessments made/ Orders entered  

## 2013-01-29 NOTE — Addendum Note (Signed)
Addended by: Verdie Shire on: 01/29/2013 11:57 AM   Modules accepted: Orders

## 2013-01-29 NOTE — Patient Instructions (Addendum)
Walking is especially valuable in preventing Osteoporosis. Eat a low-fat diet with lots of fruits and vegetables, up to 7-9 servings per day. This would eliminate need for vitamin supplements for most individuals. Consume less than 30 grams of sugar per day from foods & drinks with High Fructose Corn Syrup as #2,3 or #4 on label. The legal document "Health Care Power of Attorney & Living Will " verifies decisions concerning your health care. Minimal Blood Pressure Goal= AVERAGE < 140/90;  Ideal is an AVERAGE < 135/85. This AVERAGE should be calculated from @ least 5-7 BP readings taken @ different times of day on different days of week. You should not respond to isolated BP readings , but rather the AVERAGE for that week .Please bring your  blood pressure cuff to office visits to verify that it is reliable.It  can also be checked against the blood pressure device at the pharmacy. Finger or wrist cuffs are not dependable; an arm cuff is.

## 2013-02-04 ENCOUNTER — Encounter: Payer: Self-pay | Admitting: Internal Medicine

## 2013-02-06 NOTE — Telephone Encounter (Signed)
Please schedule a Bone Density and call the patient.      KP

## 2013-02-09 NOTE — Telephone Encounter (Signed)
Lmovm for pt to call office and schedule bone density.

## 2013-02-12 ENCOUNTER — Ambulatory Visit (INDEPENDENT_AMBULATORY_CARE_PROVIDER_SITE_OTHER)
Admission: RE | Admit: 2013-02-12 | Discharge: 2013-02-12 | Disposition: A | Discharge status: Home / Self Care | Payer: Medicare Other | Source: Ambulatory Visit | Attending: Internal Medicine | Admitting: Internal Medicine

## 2013-02-12 DIAGNOSIS — M899 Disorder of bone, unspecified: Secondary | ICD-10-CM

## 2013-02-16 ENCOUNTER — Telehealth: Payer: Self-pay | Admitting: *Deleted

## 2013-02-16 ENCOUNTER — Encounter: Payer: Self-pay | Admitting: *Deleted

## 2013-02-16 NOTE — Telephone Encounter (Signed)
Message copied by Verdie Shire on Mon Feb 16, 2013  4:55 PM ------      Message from: Pecola Lawless      Created: Sun Feb 15, 2013  9:05 AM       Findings : lowest T score - 1.3 @  femoral neck (hip)      Diagnosis: mild Osteopenia      Recommended lifestyle interventions to prevent Osteoporosis include calcium 600 mg daily & vitamin D3 supplementation to keep vit D  level @ least 40-60. The usual vitamin D3 dose is 1000 IU daily; but individual dose is determined by annual vitamin D level monitor. Also weight bearing exercise such as  walking 30-45 minutes 3-4  X per week is recommended.       Repeat BMD every 25 months.      Fracture risk is low              ------

## 2013-02-16 NOTE — Telephone Encounter (Signed)
Letter mailed.//AB/CMA 

## 2013-02-19 ENCOUNTER — Encounter: Payer: Self-pay | Admitting: Internal Medicine

## 2013-02-19 DIAGNOSIS — Z1212 Encounter for screening for malignant neoplasm of rectum: Secondary | ICD-10-CM | POA: Diagnosis not present

## 2013-02-19 DIAGNOSIS — E7800 Pure hypercholesterolemia, unspecified: Secondary | ICD-10-CM | POA: Diagnosis not present

## 2013-02-19 DIAGNOSIS — I10 Essential (primary) hypertension: Secondary | ICD-10-CM | POA: Diagnosis not present

## 2013-02-19 NOTE — Telephone Encounter (Signed)
Spoke with the pt and informed her that her BDS results will not show-up on MyChart.  Informed her that I mailed Dr. Frederik Pear note regarding her BDS results to her on 02-16-13.  Pt understood and agreed.//AB/CMA

## 2013-02-26 ENCOUNTER — Ambulatory Visit
Admission: RE | Admit: 2013-02-26 | Discharge: 2013-02-26 | Disposition: A | Discharge status: Home / Self Care | Payer: Medicare Other | Source: Ambulatory Visit

## 2013-02-26 DIAGNOSIS — Z23 Encounter for immunization: Secondary | ICD-10-CM | POA: Diagnosis not present

## 2013-02-26 DIAGNOSIS — Z1231 Encounter for screening mammogram for malignant neoplasm of breast: Secondary | ICD-10-CM | POA: Diagnosis not present

## 2013-03-12 ENCOUNTER — Other Ambulatory Visit: Payer: Self-pay

## 2013-03-19 ENCOUNTER — Other Ambulatory Visit: Payer: Self-pay | Admitting: Internal Medicine

## 2013-03-19 NOTE — Telephone Encounter (Signed)
Pravastatin refilled.  

## 2013-03-26 ENCOUNTER — Other Ambulatory Visit: Payer: Self-pay | Admitting: Internal Medicine

## 2013-03-26 DIAGNOSIS — Z961 Presence of intraocular lens: Secondary | ICD-10-CM | POA: Diagnosis not present

## 2013-03-27 NOTE — Telephone Encounter (Signed)
Benazepril refilled per protocol 

## 2013-04-09 ENCOUNTER — Other Ambulatory Visit: Payer: Self-pay | Admitting: Internal Medicine

## 2013-04-09 NOTE — Telephone Encounter (Signed)
Bisoprolol-hctz refilled per protocol

## 2013-07-10 ENCOUNTER — Ambulatory Visit (INDEPENDENT_AMBULATORY_CARE_PROVIDER_SITE_OTHER): Payer: Medicare Other | Admitting: Internal Medicine

## 2013-07-10 ENCOUNTER — Encounter: Payer: Self-pay | Admitting: Internal Medicine

## 2013-07-10 VITALS — BP 120/80 | HR 63 | Temp 99.3°F | Wt 209.1 lb

## 2013-07-10 DIAGNOSIS — J209 Acute bronchitis, unspecified: Secondary | ICD-10-CM | POA: Diagnosis not present

## 2013-07-10 MED ORDER — PRAVASTATIN SODIUM 40 MG PO TABS: ORAL_TABLET | ORAL | Status: DC

## 2013-07-10 MED ORDER — BISOPROLOL-HYDROCHLOROTHIAZIDE 5-6.25 MG PO TABS: ORAL_TABLET | ORAL | Status: DC

## 2013-07-10 MED ORDER — AMOXICILLIN 500 MG PO CAPS: 500.0000 mg | ORAL_CAPSULE | Freq: Three times a day (TID) | ORAL | Status: DC

## 2013-07-10 MED ORDER — HYDROCODONE-HOMATROPINE 5-1.5 MG/5ML PO SYRP: 5.0000 mL | ORAL_SOLUTION | Freq: Four times a day (QID) | ORAL | Status: DC | PRN

## 2013-07-10 NOTE — Progress Notes (Signed)
Pre visit review using our clinic review tool, if applicable. No additional management support is needed unless otherwise documented below in the visit note. 

## 2013-07-10 NOTE — Progress Notes (Signed)
   Subjective:    Patient ID: Kim Fuller, female    DOB: 30-Dec-1945, 68 y.o.   MRN: [id number]  HPI Symptoms began on Wednesday, described as throat tickle, needing to clear throat.  Reports yellow-green sputum from chest. Cough is worrisome at night, wheezing.  Clear nasal secretions without associated frontal headache or maxillary sinus pain. She has taken Mucinex Plain, ColdEze lozenges, with some relief.   Husband seen last week for similar symptoms, finishing Z-Pack today; feeling better.   Review of Systems Denies fever, chills, sweats. Denies dyspnea, pain with deep breathing, otalgia/otorrhea     Objective:   Physical Exam General appearance:good health ;well nourished; no acute distress or increased work of breathing is present. No lymphadenopathy about the head, neck, or axilla noted.  Eyes: No conjunctival inflammation or lid edema is present. EOM & vision intact Ears: External ear exam shows no significant lesions or deformities. Otoscopic examination reveals clear canals, tympanic membranes are intact bilaterally without bulging, retraction, inflammation or discharge.  Nose: External nasal examination shows no deformity or inflammation. Nasal mucosa are pink and moist without lesions or exudates. No septal dislocation or deviation.No obstruction to airflow.  Oral exam: Dental hygiene is good; lips and gums are healthy appearing.There is milid oropharyngeal erythema, no pillar exudate noted. Coated tongue. Hoarse Neck: No deformities, masses, or tenderness noted. Supple with full range of motion without pain.  Heart: Normal rate and regular rhythm. S1 and S2 normal without gallop, murmur, click, rub or other extra sounds.  Lungs:Chest clear to auscultation; no wheezes noted; rhonchi noted bilaterally, R > L. No increased work of breathing.  Extremities: No cyanosis, edema, or clubbing noted  Skin: Warm & dry .         Assessment & Plan:  1 bronchitis - (Amox,  Hydromet)  #2 nasal cleansing program

## 2013-07-10 NOTE — Patient Instructions (Signed)

## 2013-07-10 NOTE — Progress Notes (Signed)
   Subjective:    Patient ID: Kim Fuller, female    DOB: 10-06-1945, 68 y.o.   MRN: [id number]  HPI Symptoms began on Wednesday, described as throat tickle, needing to clear throat.  Reports yellow-green sputum from chest. Cough is worrisome at night, wheezing.  Clear nasal secretions. Has taken Mucinex Plain, ColdEze lozenges, with some relief.   Husband seen last week for similar symptoms, finishing Z-Pack today; feeling better.    Review of Systems Denies fever, chills, sweats. Denies dyspnea, pain with deep breathing, otalgia/otorrhea     Objective:   Physical Exam  General appearance:good health ;well nourished; no acute distress or increased work of breathing is present.  No  lymphadenopathy about the head, neck, or axilla noted.   Eyes: No conjunctival inflammation or lid edema is present.   Ears:  External ear exam shows no significant lesions or deformities.  Otoscopic examination reveals clear canals, tympanic membranes are intact bilaterally without bulging, retraction, inflammation or discharge.  Nose:  External nasal examination shows no deformity or inflammation. Nasal mucosa are pink and moist without lesions or exudates. No septal dislocation or deviation.No obstruction to airflow.   Oral exam: Dental hygiene is good; lips and gums are healthy appearing.There is milid oropharyngeal erythema, no pillar exudate noted. Coated tongue.   Neck:  No deformities, thyromegaly, masses, or tenderness noted.   Supple with full range of motion without pain.   Heart:  Normal rate and regular rhythm. S1 and S2 normal without gallop, murmur, click, rub or other extra sounds.   Lungs:Chest clear to auscultation; no wheezes noted; rhonchi noted bilaterally, R > L. No increased work of breathing.    Extremities:  No cyanosis, edema, or clubbing  noted    Skin: Warm & dry w/o jaundice or tenting.       Assessment & Plan:  #1 bronchitis - (Z-pack, Hydromet) #2 nasal  cleansing program

## 2013-07-16 ENCOUNTER — Telehealth: Payer: Self-pay

## 2013-07-16 MED ORDER — MAGIC MOUTHWASH: 5.0000 mL | Freq: Three times a day (TID) | ORAL | Status: DC | PRN

## 2013-07-16 NOTE — Telephone Encounter (Signed)
Sent mouthwash to [facility]. Notified pt...Kim Fuller

## 2013-07-16 NOTE — Telephone Encounter (Signed)
The patient called and is hoping to get an rx for magic mouthwash called into the pharmacy.  She states she is having a bit of thrush in her mouth.  Pharmacy - [place] [phone]

## 2013-07-16 NOTE — Telephone Encounter (Signed)
90 cc , 1 tsp tid prn  Gargle  & swallow

## 2013-07-17 ENCOUNTER — Telehealth: Payer: Self-pay | Admitting: Internal Medicine

## 2013-07-17 ENCOUNTER — Other Ambulatory Visit: Payer: Self-pay | Admitting: Internal Medicine

## 2013-07-17 MED ORDER — FLUCONAZOLE 150 MG PO TABS: 150.0000 mg | ORAL_TABLET | Freq: Once | ORAL | Status: DC

## 2013-07-17 NOTE — Telephone Encounter (Signed)
Phoned & left MD's instructions and script phoned in on voicemail.

## 2013-07-17 NOTE — Telephone Encounter (Signed)
Pt called stated that [facility] will fill rx for magic mouth wash because there is aluminum in it. Please advise something without aluminum. Pt is not allergic to it but [facility] just doesn't fill it.

## 2013-07-17 NOTE — Telephone Encounter (Signed)
Use saline gargles and  OTC Zicam lozenges as needed. Diflucan will be called in.

## 2013-12-11 DIAGNOSIS — L820 Inflamed seborrheic keratosis: Secondary | ICD-10-CM | POA: Diagnosis not present

## 2013-12-11 DIAGNOSIS — D2370 Other benign neoplasm of skin of unspecified lower limb, including hip: Secondary | ICD-10-CM | POA: Diagnosis not present

## 2013-12-11 DIAGNOSIS — D2360 Other benign neoplasm of skin of unspecified upper limb, including shoulder: Secondary | ICD-10-CM | POA: Diagnosis not present

## 2013-12-11 DIAGNOSIS — L918 Other hypertrophic disorders of the skin: Secondary | ICD-10-CM | POA: Diagnosis not present

## 2013-12-11 DIAGNOSIS — D239 Other benign neoplasm of skin, unspecified: Secondary | ICD-10-CM | POA: Diagnosis not present

## 2013-12-11 DIAGNOSIS — L908 Other atrophic disorders of skin: Secondary | ICD-10-CM | POA: Diagnosis not present

## 2013-12-11 DIAGNOSIS — D485 Neoplasm of uncertain behavior of skin: Secondary | ICD-10-CM | POA: Diagnosis not present

## 2013-12-11 DIAGNOSIS — D1801 Hemangioma of skin and subcutaneous tissue: Secondary | ICD-10-CM | POA: Diagnosis not present

## 2013-12-11 DIAGNOSIS — L821 Other seborrheic keratosis: Secondary | ICD-10-CM | POA: Diagnosis not present

## 2013-12-18 ENCOUNTER — Encounter: Payer: Self-pay | Admitting: Internal Medicine

## 2013-12-18 DIAGNOSIS — D229 Melanocytic nevi, unspecified: Secondary | ICD-10-CM | POA: Insufficient documentation

## 2013-12-28 ENCOUNTER — Encounter: Payer: Self-pay | Admitting: Internal Medicine

## 2014-01-25 ENCOUNTER — Other Ambulatory Visit: Payer: Self-pay

## 2014-01-25 DIAGNOSIS — Z1231 Encounter for screening mammogram for malignant neoplasm of breast: Secondary | ICD-10-CM

## 2014-02-05 ENCOUNTER — Encounter: Payer: Self-pay | Admitting: Internal Medicine

## 2014-02-05 ENCOUNTER — Other Ambulatory Visit: Payer: Self-pay | Admitting: Internal Medicine

## 2014-02-05 ENCOUNTER — Ambulatory Visit (INDEPENDENT_AMBULATORY_CARE_PROVIDER_SITE_OTHER): Payer: Medicare Other | Admitting: Internal Medicine

## 2014-02-05 ENCOUNTER — Other Ambulatory Visit (INDEPENDENT_AMBULATORY_CARE_PROVIDER_SITE_OTHER): Payer: Medicare Other

## 2014-02-05 VITALS — BP 140/86 | HR 52 | Temp 98.2°F | Resp 14 | Ht 61.5 in | Wt 184.5 lb

## 2014-02-05 DIAGNOSIS — M8580 Other specified disorders of bone density and structure, unspecified site: Secondary | ICD-10-CM

## 2014-02-05 DIAGNOSIS — E782 Mixed hyperlipidemia: Secondary | ICD-10-CM

## 2014-02-05 DIAGNOSIS — Z23 Encounter for immunization: Secondary | ICD-10-CM | POA: Diagnosis not present

## 2014-02-05 DIAGNOSIS — I10 Essential (primary) hypertension: Secondary | ICD-10-CM

## 2014-02-05 DIAGNOSIS — K219 Gastro-esophageal reflux disease without esophagitis: Secondary | ICD-10-CM

## 2014-02-05 LAB — CBC WITH DIFFERENTIAL/PLATELET
Basophils Absolute: 0 10*3/uL (ref 0.0–0.1)
Basophils Relative: 0.5 % (ref 0.0–3.0)
EOS ABS: 0.1 10*3/uL (ref 0.0–0.7)
Eosinophils Relative: 1.6 % (ref 0.0–5.0)
HCT: 40.6 % (ref 36.0–46.0)
HEMOGLOBIN: 13.6 g/dL (ref 12.0–15.0)
Lymphocytes Relative: 36.1 % (ref 12.0–46.0)
Lymphs Abs: 1.8 10*3/uL (ref 0.7–4.0)
MCHC: 33.5 g/dL (ref 30.0–36.0)
MCV: 95.4 fl (ref 78.0–100.0)
Monocytes Absolute: 0.4 10*3/uL (ref 0.1–1.0)
Monocytes Relative: 8.8 % (ref 3.0–12.0)
NEUTROS ABS: 2.7 10*3/uL (ref 1.4–7.7)
Neutrophils Relative %: 53 % (ref 43.0–77.0)
PLATELETS: 173 10*3/uL (ref 150.0–400.0)
RBC: 4.26 Mil/uL (ref 3.87–5.11)
RDW: 12.6 % (ref 11.5–15.5)
WBC: 5 10*3/uL (ref 4.0–10.5)

## 2014-02-05 LAB — HEPATIC FUNCTION PANEL
ALBUMIN: 4.1 g/dL (ref 3.5–5.2)
ALT: 14 U/L (ref 0–35)
AST: 24 U/L (ref 0–37)
Alkaline Phosphatase: 56 U/L (ref 39–117)
BILIRUBIN TOTAL: 0.9 mg/dL (ref 0.2–1.2)
Bilirubin, Direct: 0.1 mg/dL (ref 0.0–0.3)
Total Protein: 7.8 g/dL (ref 6.0–8.3)

## 2014-02-05 LAB — BASIC METABOLIC PANEL
BUN: 15 mg/dL (ref 6–23)
CO2: 26 meq/L (ref 19–32)
CREATININE: 0.7 mg/dL (ref 0.4–1.2)
Calcium: 9.3 mg/dL (ref 8.4–10.5)
Chloride: 102 mEq/L (ref 96–112)
GFR: 86.79 mL/min (ref >60.00)
GLUCOSE: 92 mg/dL (ref 70–99)
Potassium: 4.1 mEq/L (ref 3.5–5.1)
SODIUM: 137 meq/L (ref 135–145)

## 2014-02-05 LAB — VITAMIN D 25 HYDROXY (VIT D DEFICIENCY, FRACTURES): VITD: 64.37 ng/mL (ref 30.00–100.00)

## 2014-02-05 LAB — TSH: TSH: 1.56 u[IU]/mL (ref 0.35–4.50)

## 2014-02-05 NOTE — Assessment & Plan Note (Signed)
Blood pressure goals reviewed. BMET 

## 2014-02-05 NOTE — Assessment & Plan Note (Signed)
Lipids, LFTs, TSH ,CK 

## 2014-02-05 NOTE — Patient Instructions (Signed)
Reflux of gastric acid may be asymptomatic as this may occur mainly during sleep.The triggers for reflux  include stress; the "aspirin family" ; alcohol; peppermint; and caffeine (coffee, tea, cola, and chocolate). The aspirin family would include aspirin and the nonsteroidal agents such as ibuprofen &  Naproxen. Tylenol would not cause reflux. If having symptoms ; food & drink should be avoided for @ least 2 hours before going to bed.  Minimal Blood Pressure Goal= AVERAGE < 140/90;  Ideal is an AVERAGE < 135/85. This AVERAGE should be calculated from @ least 5-7 BP readings taken @ different times of day on different days of week. You should not respond to isolated BP readings , but rather the AVERAGE for that week .Please bring your  blood pressure cuff to office visits to verify that it is reliable.It  can also be checked against the blood pressure device at the pharmacy. Finger or wrist cuffs are not dependable; an arm cuff is.  Your next office appointment will be determined based upon review of your pending labs . Those instructions will be transmitted to you through My Chart    A weight of 150# is reasonable for your goal.

## 2014-02-05 NOTE — Progress Notes (Signed)
Subjective:    Patient ID: Kim Fuller, female    DOB: Apr 10, 1946, 68 y.o.   MRN: [id number]  HPI  She is here to assess active health issues & conditions. PMH, FH, & Social history verified & updated.  She has been compliant with her antihypertensive and statin medications. The pravastatin  cost has risen from $10 for 3 months to $49.  She is on a heart healthy diet. She's also been on Weight Watchers with a 27 pound purposeful weight loss  She walks 60 minutes most days without symptoms  Her blood pressure averages 110/70.  Advanced cholesterol testing reveals that her LDL goal should be less than 100, ideally less than 70. There is no premature coronary disease and no stroke in the family.  She has some dyspepsia without other active GI symptoms  She's had occasional pedal edema after standing for periods of time  She's had a rare episode of right lower extremity musculoskeletal pain unassociated with any low back pain or neurologic deficit.    Review of Systems Unexplained weight loss, abdominal pain,  dysphagia, melena, rectal bleeding, or persistently small caliber stools are denied.   Chest pain, palpitations, tachycardia, exertional dyspnea, paroxysmal nocturnal dyspnea, or claudication are absent.       Objective:   Physical Exam Gen.: Healthy and well-nourished in appearance. Alert, appropriate and cooperative throughout exam. Appears younger than stated age  Head: Normocephalic without obvious abnormalities  Eyes: No corneal or conjunctival inflammation noted. Pupils equal round reactive to light and accommodation. Extraocular motion intact.  Ears: External  ear exam reveals no significant lesions or deformities. Canals clear .TMs normal. Hearing is grossly normal bilaterally. Nose: External nasal exam reveals no deformity or inflammation. Nasal mucosa are pink and moist. No lesions or exudates noted.   Mouth: Oral mucosa and oropharynx reveal no lesions or  exudates. Teeth in good repair. Neck: No deformities, masses, or tenderness noted. Range of motion & Thyroid normal. Lungs: Normal respiratory effort; chest expands symmetrically. Lungs are clear to auscultation without rales, wheezes, or increased work of breathing. Heart: Normal rate and rhythm. Normal S1 and S2. No gallop, click, or rub. No murmur. Abdomen: Bowel sounds normal; abdomen soft and nontender. No masses, organomegaly or hernias noted. Genitalia: as per Gyn                                  Musculoskeletal/extremities: No deformity or scoliosis noted of  the thoracic or lumbar spine.  No clubbing, cyanosis, edema, or significant extremity  deformity noted. Range of motion normal .Tone & strength normal. Hand joints normal She has some fusiform changes the knees with minimal crepitus. Range of motion is extremely good. Fingernail / toenail health good. Able to lie down & sit up w/o help. Negative SLR bilaterally Vascular: Carotid, radial artery, dorsalis pedis and  posterior tibial pulses are full and equal. No bruits present. Neurologic: Alert and oriented x3. Deep tendon reflexes symmetrical and normal.  Gait normal .      Skin: Intact without suspicious lesions or rashes. Lymph: No cervical, axillary lymphadenopathy present. Psych: Mood and affect are normal. Normally interactive  Assessment & Plan:  See Current Assessment & Plan in Problem List under specific Diagnosis  EKG reveals marked sinus bradycardia with a rate of 42. She is on generic Ziac. Based on the pending labs (particularly TSH) consideration could be given to discontinuing the beta blocker component with subsequent monitoring blood pressure.

## 2014-02-05 NOTE — Assessment & Plan Note (Signed)
CBC

## 2014-02-05 NOTE — Progress Notes (Signed)
Pre visit review using our clinic review tool, if applicable. No additional management support is needed unless otherwise documented below in the visit note. 

## 2014-02-05 NOTE — Assessment & Plan Note (Signed)
Vitamin D level 

## 2014-02-07 ENCOUNTER — Encounter: Payer: Self-pay | Admitting: Internal Medicine

## 2014-02-07 LAB — NMR LIPOPROFILE WITH LIPIDS
Cholesterol, Total: 164 mg/dL (ref 100–199)
HDL Particle Number: 33.1 umol/L (ref >=30.5)
HDL Size: 9 nm — ABNORMAL LOW (ref >=9.2)
HDL-C: 50 mg/dL (ref >39)
LDL CALC: 86 mg/dL (ref 0–99)
LDL PARTICLE NUMBER: 1314 nmol/L — AB (ref <1000)
LDL SIZE: 20.2 nm (ref >=20.8)
LP-IR SCORE: 34 (ref <=45)
Large HDL-P: 5.9 umol/L (ref >=4.8)
Large VLDL-P: 1 nmol/L (ref <=2.7)
SMALL LDL PARTICLE NUMBER: 788 nmol/L — AB (ref <=527)
Triglycerides: 141 mg/dL (ref 0–149)
VLDL Size: 43 nm (ref <=46.6)

## 2014-02-08 ENCOUNTER — Telehealth: Payer: Self-pay | Admitting: Internal Medicine

## 2014-02-08 NOTE — Telephone Encounter (Signed)
emmi emailed °

## 2014-02-10 ENCOUNTER — Other Ambulatory Visit: Payer: Self-pay | Admitting: Internal Medicine

## 2014-02-10 ENCOUNTER — Encounter: Payer: Self-pay | Admitting: Internal Medicine

## 2014-02-10 DIAGNOSIS — E785 Hyperlipidemia, unspecified: Secondary | ICD-10-CM

## 2014-02-10 MED ORDER — SIMVASTATIN 20 MG PO TABS: 20.0000 mg | ORAL_TABLET | Freq: Every day | ORAL | Status: DC

## 2014-02-25 DIAGNOSIS — Z124 Encounter for screening for malignant neoplasm of cervix: Secondary | ICD-10-CM | POA: Diagnosis not present

## 2014-02-25 DIAGNOSIS — Z1212 Encounter for screening for malignant neoplasm of rectum: Secondary | ICD-10-CM | POA: Diagnosis not present

## 2014-03-04 ENCOUNTER — Ambulatory Visit
Admission: RE | Admit: 2014-03-04 | Discharge: 2014-03-04 | Disposition: A | Discharge status: Home / Self Care | Payer: Medicare Other | Source: Ambulatory Visit

## 2014-03-04 DIAGNOSIS — Z1231 Encounter for screening mammogram for malignant neoplasm of breast: Secondary | ICD-10-CM

## 2014-04-12 ENCOUNTER — Other Ambulatory Visit: Payer: Self-pay

## 2014-04-12 MED ORDER — BENAZEPRIL HCL 40 MG PO TABS: ORAL_TABLET | ORAL | Status: DC

## 2014-04-15 DIAGNOSIS — Z961 Presence of intraocular lens: Secondary | ICD-10-CM | POA: Diagnosis not present

## 2014-04-29 ENCOUNTER — Telehealth: Payer: Self-pay | Admitting: Internal Medicine

## 2014-04-29 MED ORDER — BISOPROLOL-HYDROCHLOROTHIAZIDE 5-6.25 MG PO TABS: ORAL_TABLET | ORAL | Status: DC

## 2014-04-29 NOTE — Telephone Encounter (Signed)
Notified pt med sent to [place]...[name]

## 2014-04-29 NOTE — Telephone Encounter (Signed)
Patient requesting refill of bisoprolol-hydrochlorothiazide (ZIAC) 5-6.25 MG per tablet at the [facility] listed in [place]

## 2014-05-17 ENCOUNTER — Encounter: Payer: Self-pay | Admitting: Internal Medicine

## 2014-05-17 ENCOUNTER — Ambulatory Visit (INDEPENDENT_AMBULATORY_CARE_PROVIDER_SITE_OTHER): Payer: Medicare Other | Admitting: Internal Medicine

## 2014-05-17 VITALS — BP 120/90 | HR 55 | Temp 98.5°F | Ht 61.5 in | Wt 179.5 lb

## 2014-05-17 DIAGNOSIS — J209 Acute bronchitis, unspecified: Secondary | ICD-10-CM | POA: Diagnosis not present

## 2014-05-17 MED ORDER — BENZONATATE 200 MG PO CAPS: 200.0000 mg | ORAL_CAPSULE | Freq: Three times a day (TID) | ORAL | Status: DC | PRN

## 2014-05-17 MED ORDER — AMOXICILLIN 500 MG PO CAPS: 500.0000 mg | ORAL_CAPSULE | Freq: Three times a day (TID) | ORAL | Status: DC

## 2014-05-17 NOTE — Progress Notes (Signed)
Pre visit review using our clinic review tool, if applicable. No additional management support is needed unless otherwise documented below in the visit note. 

## 2014-05-17 NOTE — Patient Instructions (Signed)

## 2014-05-17 NOTE — Progress Notes (Signed)
   Subjective:    Patient ID: Kim Fuller, female    DOB: 1946-02-09, 69 y.o.   MRN: [id number]  HPI   Symptoms began as clearing of her throat 05/11/14. As of 1/7 she had developed a sore throat. She describes a cough due to tickle in her throat. The cough is loose but nonproductive  She has some associated sneezing. She also had discomfort in the left ear.  She describes paranasal area pressure.  She's been using Claritin &  zinc lozenges with some benefit  Today she noted left flank discomfort. She's had some frequency but no other genitourinary symptoms.  Review of Systems She specifically denies frontal headache, facial pain, nasal purulence, dental pain, or otic discharge  She has no extrinsic symptoms except for the sneezing.  There is no associated wheezing or shortness of breath.  She also denies fever or chills. She has had hot flashes which are unrelated.  She denies dysuria, pyuria, or hematuria.    Objective:   Physical Exam   General appearance:good health ;well nourished; no acute distress or increased work of breathing is present.  No  lymphadenopathy about the head, neck, or axilla noted.   Eyes: No conjunctival inflammation or lid edema is present. There is no scleral icterus.  Ears:  External ear exam shows no significant lesions or deformities.  Otoscopic examination reveals clear canals, tympanic membranes are intact bilaterally without bulging, retraction, inflammation or discharge.  Nose:  External nasal examination shows no deformity or inflammation. Nasal mucosa are pink and moist without lesions or exudates. No septal dislocation or deviation.No obstruction to airflow.   Oral exam: Dental hygiene is good; lips and gums are healthy appearing.There is no oropharyngeal erythema or exudate noted.   Neck:  No deformities, thyromegaly, masses, or tenderness noted.   Supple with full range of motion without pain.   Heart:  Normal rate and regular rhythm.  S1 and S2 normal without gallop, murmur, click, rub or other extra sounds.   Lungs:Chest clear to auscultation; no wheezes, rhonchi,rales ,or rubs present.No increased work of breathing.    Extremities:  No cyanosis, edema, or clubbing  noted    Skin: Warm & dry w/o jaundice or tenting.      Assessment & Plan:  #1 acute bronchitis w/o bronchospasm #2 rhinitis Plan: See orders and recommendations

## 2014-05-31 ENCOUNTER — Telehealth: Payer: Self-pay | Admitting: Internal Medicine

## 2014-05-31 ENCOUNTER — Other Ambulatory Visit: Payer: Self-pay | Admitting: Internal Medicine

## 2014-05-31 DIAGNOSIS — IMO0001 Reserved for inherently not codable concepts without codable children: Secondary | ICD-10-CM

## 2014-05-31 NOTE — Telephone Encounter (Signed)
Patient states the cholesterol medication she started taking 5 weeks ago is giving her bad leg cramps. She would like if there is a different medication she can take. Pt uses [facility] in [place]. CB# [phone]

## 2014-05-31 NOTE — Telephone Encounter (Signed)
She needs CK before stopping it if possible  to prove cause & effect; order entered

## 2014-06-01 NOTE — Telephone Encounter (Signed)
No need for CK as statin D/Ced.  Please follow a Mediaterranean type diet  (many good cook books readily available) or review Dr Nunzio Cory book Eat, [place] for best  dietary cholesterol information & options.  Cardiovascular exercise, this can be as simple a program as walking, is recommended 30-45 minutes 3-4 times per week. If you're not exercising you should take 6-8 weeks to build up to this level.  Recheck fasting Lipid Panel with CK after 4 months of nutritional & exercise changes .

## 2014-06-01 NOTE — Telephone Encounter (Signed)
Patient states she stopped the cholesterol medication last Thursday. I gave her the hours for lab to come have labs drawn.

## 2014-06-01 NOTE — Telephone Encounter (Signed)
Patient has been of Dr [employer] advisement.

## 2014-09-17 ENCOUNTER — Encounter: Payer: Self-pay | Admitting: Gastroenterology

## 2014-09-17 ENCOUNTER — Encounter: Payer: Self-pay | Admitting: Internal Medicine

## 2014-09-17 ENCOUNTER — Ambulatory Visit (INDEPENDENT_AMBULATORY_CARE_PROVIDER_SITE_OTHER): Payer: Medicare Other | Admitting: Internal Medicine

## 2014-09-17 VITALS — BP 132/78 | HR 52 | Temp 98.7°F | Resp 16 | Wt 181.0 lb

## 2014-09-17 DIAGNOSIS — J069 Acute upper respiratory infection, unspecified: Secondary | ICD-10-CM | POA: Diagnosis not present

## 2014-09-17 DIAGNOSIS — B9789 Other viral agents as the cause of diseases classified elsewhere: Secondary | ICD-10-CM

## 2014-09-17 MED ORDER — FLUTICASONE PROPIONATE 50 MCG/ACT NA SUSP: 2.0000 | Freq: Every day | NASAL | Status: DC

## 2014-09-17 NOTE — Progress Notes (Signed)
   Subjective:    Patient ID: Kim Fuller, female    DOB: 1946/01/06, 69 y.o.   MRN: [id number]  HPI The patient is a 69 YO female who is coming in for 1 days of sore throat and nasal drainage. She denies cough, fevers, chills. She denies chest pains or SOB. She is not having facial tenderness or ear pain. She is getting worse over the last day. Has tried mucinex over the counter and thinks it might have helped a little. Her husband was sick last week.   Review of Systems  Constitutional: Negative for fever, chills, activity change, appetite change, fatigue and unexpected weight change.  HENT: Positive for congestion, postnasal drip, rhinorrhea and sore throat. Negative for ear discharge, ear pain, sinus pressure and trouble swallowing.   Eyes: Negative.   Respiratory: Negative for cough, chest tightness, shortness of breath and wheezing.   Cardiovascular: Negative for chest pain, palpitations and leg swelling.  Gastrointestinal: Negative.       Objective:   Physical Exam  Constitutional: She appears well-developed and well-nourished.  HENT:  Head: Normocephalic and atraumatic.  Right Ear: External ear normal.  Left Ear: External ear normal.  Oropharynx with redness and clear drainage, nasal turbinates swollen and red. No crusting.  Eyes: EOM are normal.  Neck: Normal range of motion. No JVD present. No tracheal deviation present. No thyromegaly present.  Cardiovascular: Normal rate and regular rhythm.   Pulmonary/Chest: Effort normal and breath sounds normal. No respiratory distress. She has no wheezes. She has no rales.  Abdominal: Soft.  Lymphadenopathy:    She has no cervical adenopathy.   Filed Vitals:   09/17/14 1011  BP: 132/78  Pulse: 52  Temp: 98.7 F (37.1 C)  TempSrc: Oral  Resp: 16  Weight: 181 lb (82.101 kg)  SpO2: 94%      Assessment & Plan:

## 2014-09-17 NOTE — Progress Notes (Signed)
Pre visit review using our clinic review tool, if applicable. No additional management support is needed unless otherwise documented below in the visit note. 

## 2014-09-17 NOTE — Assessment & Plan Note (Signed)
Symptoms 1 day and no indication for antibiotic. Talked to her about the fact that this is likely viral and can take 5-10 days to fully resolve. Given information about symptomatic relief. Rx for flonase to help with her congestion and drainage. She will call back in 1 week if no improvement.

## 2014-09-17 NOTE — Patient Instructions (Signed)
We have sent in a nose spray which has a steroid medicine in it to dry up the congestion and drainage. It is called flonase and you use 2 puffs in each nostril once a day.   If you are not feeling better in 1 week call us back and we may need to call in antibiotics.   This is likely a virus and will take 5-10 days to get fully better.  Upper Respiratory Infection, Adult An upper respiratory infection (URI) is also sometimes known as the common cold. The upper respiratory tract includes the nose, sinuses, throat, trachea, and bronchi. Bronchi are the airways leading to the lungs. Most people improve within 1 week, but symptoms can last up to 2 weeks. A residual cough may last even longer.  CAUSES Many different viruses can infect the tissues lining the upper respiratory tract. The tissues become irritated and inflamed and often become very moist. Mucus production is also common. A cold is contagious. You can easily spread the virus to others by oral contact. This includes kissing, sharing a glass, coughing, or sneezing. Touching your mouth or nose and then touching a surface, which is then touched by another person, can also spread the virus. SYMPTOMS  Symptoms typically develop 1 to 3 days after you come in contact with a cold virus. Symptoms vary from person to person. They may include:  Runny nose.  Sneezing.  Nasal congestion.  Sinus irritation.  Sore throat.  Loss of voice (laryngitis).  Cough.  Fatigue.  Muscle aches.  Loss of appetite.  Headache.  Low-grade fever. DIAGNOSIS  You might diagnose your own cold based on familiar symptoms, since most people get a cold 2 to 3 times a year. Your caregiver can confirm this based on your exam. Most importantly, your caregiver can check that your symptoms are not due to another disease such as strep throat, sinusitis, pneumonia, asthma, or epiglottitis. Blood tests, throat tests, and X-rays are not necessary to diagnose a common  cold, but they may sometimes be helpful in excluding other more serious diseases. Your caregiver will decide if any further tests are required. RISKS AND COMPLICATIONS  You may be at risk for a more severe case of the common cold if you smoke cigarettes, have chronic heart disease (such as heart failure) or lung disease (such as asthma), or if you have a weakened immune system. The very young and very old are also at risk for more serious infections. Bacterial sinusitis, middle ear infections, and bacterial pneumonia can complicate the common cold. The common cold can worsen asthma and chronic obstructive pulmonary disease (COPD). Sometimes, these complications can require emergency medical care and may be life-threatening. PREVENTION  The best way to protect against getting a cold is to practice good hygiene. Avoid oral or hand contact with people with cold symptoms. Wash your hands often if contact occurs. There is no clear evidence that vitamin C, vitamin E, echinacea, or exercise reduces the chance of developing a cold. However, it is always recommended to get plenty of rest and practice good nutrition. TREATMENT  Treatment is directed at relieving symptoms. There is no cure. Antibiotics are not effective, because the infection is caused by a virus, not by bacteria. Treatment may include:  Increased fluid intake. Sports drinks offer valuable electrolytes, sugars, and fluids.  Breathing heated mist or steam (vaporizer or shower).  Eating chicken soup or other clear broths, and maintaining good nutrition.  Getting plenty of rest.  Using gargles or lozenges  for comfort.  Controlling fevers with ibuprofen or acetaminophen as directed by your caregiver.  Increasing usage of your inhaler if you have asthma. Zinc gel and zinc lozenges, taken in the first 24 hours of the common cold, can shorten the duration and lessen the severity of symptoms. Pain medicines may help with fever, muscle aches, and  throat pain. A variety of non-prescription medicines are available to treat congestion and runny nose. Your caregiver can make recommendations and may suggest nasal or lung inhalers for other symptoms.  HOME CARE INSTRUCTIONS   Only take over-the-counter or prescription medicines for pain, discomfort, or fever as directed by your caregiver.  Use a warm mist humidifier or inhale steam from a shower to increase air moisture. This may keep secretions moist and make it easier to breathe.  Drink enough water and fluids to keep your urine clear or pale yellow.  Rest as needed.  Return to work when your temperature has returned to normal or as your caregiver advises. You may need to stay home longer to avoid infecting others. You can also use a face mask and careful hand washing to prevent spread of the virus. SEEK MEDICAL CARE IF:   After the first few days, you feel you are getting worse rather than better.  You need your caregiver's advice about medicines to control symptoms.  You develop chills, worsening shortness of breath, or brown or red sputum. These may be signs of pneumonia.  You develop yellow or brown nasal discharge or pain in the face, especially when you bend forward. These may be signs of sinusitis.  You develop a fever, swollen neck glands, pain with swallowing, or white areas in the back of your throat. These may be signs of strep throat. SEEK IMMEDIATE MEDICAL CARE IF:   You have a fever.  You develop severe or persistent headache, ear pain, sinus pain, or chest pain.  You develop wheezing, a prolonged cough, cough up blood, or have a change in your usual mucus (if you have chronic lung disease).  You develop sore muscles or a stiff neck. Document Released: 10/17/2000 Document Revised: 07/16/2011 Document Reviewed: 07/29/2013 [facility] Patient Information 2015 [place], [place]. This information is not intended to replace advice given to you by your health care provider.  Make sure you discuss any questions you have with your health care provider.

## 2014-10-01 ENCOUNTER — Ambulatory Visit (INDEPENDENT_AMBULATORY_CARE_PROVIDER_SITE_OTHER): Payer: Medicare Other | Admitting: Adult Health

## 2014-10-01 ENCOUNTER — Encounter: Payer: Self-pay | Admitting: Adult Health

## 2014-10-01 VITALS — BP 138/80 | HR 66 | Temp 99.9°F | Ht 61.5 in | Wt 180.5 lb

## 2014-10-01 DIAGNOSIS — J069 Acute upper respiratory infection, unspecified: Secondary | ICD-10-CM

## 2014-10-01 MED ORDER — DOXYCYCLINE HYCLATE 100 MG PO CAPS: 100.0000 mg | ORAL_CAPSULE | Freq: Two times a day (BID) | ORAL | Status: DC

## 2014-10-01 NOTE — Progress Notes (Signed)
Subjective:    Patient ID: Kim Fuller, female    DOB: Jul 10, 1945, 69 y.o.   MRN: [id number]  HPI  Patient presents to the office for productive cough, left ear pain and the feeling of being "stopped up". She has a headache, sinus pressure and sore throat. She was seen by Dr. Doug Sou on 09/17/2014 for these issues. The left ear pain has been getting better and is almost resolved.   She does not endorse any fever, n/v/d. No sick contacts. No SOB, no CP.    Has tried steroid nasal spray, mucinex and Claritin without any improvement.   Review of Systems  Constitutional: Positive for activity change, appetite change and fatigue. Negative for fever, chills and diaphoresis.  HENT: Positive for congestion, ear pain (left ear), hearing loss (resolved), rhinorrhea, sinus pressure and sore throat. Negative for ear discharge, tinnitus, trouble swallowing and voice change.   Respiratory: Positive for cough. Negative for choking, shortness of breath and wheezing.   Cardiovascular: Negative for chest pain and palpitations.  Neurological: Negative for dizziness, weakness and light-headedness.  All other systems reviewed and are negative.  Past Medical History  Diagnosis Date  . Reactive airway disease   . Hypertension   . GERD (gastroesophageal reflux disease)   . Diverticulosis   . Osteopenia     -1.5 @ femoral neck 11/2007  . Abnormal finding on Pap smear     x1  . Hyperlipidemia     History   Social History  . Marital Status: Married    Spouse Name: N/A  . Number of Children: N/A  . Years of Education: N/A   Occupational History  . Systems analyst    Social History Main Topics  . Smoking status: Former Smoker    Quit date: 05/07/1970  . Smokeless tobacco: Not on file     Comment: Smoked as a teen  1962-1972,only up to 3 cigarettes/ day  . Alcohol Use: No  . Drug Use: No  . Sexual Activity: Not on file   Other Topics Concern  . Not on file   Social History Narrative     Past Surgical History  Procedure Laterality Date  . Tubal ligation    . Hand surgery      Trigger thumb  . Carpal tunnel release  2009     bilaterally  . Cardiovascular stress test  03/05/06  . Colonoscopy w/ polypectomy  1998    Tics @ 2004 & 2012; Dr Carlean Purl  . Cataract extraction, bilateral      Dr Gershon Crane    Family History  Problem Relation Age of Onset  . Asthma Sister   . Hypertension Sister   . Hypertension Father   . Heart attack Father     MI > 55  . Prostate cancer Father   . Lung cancer Sister     smoker  . Melanoma Sister   . Osteoporosis Sister   . Glaucoma Sister     X 2  . Diverticulosis Sister     2 sisters  . Lung cancer Sister   . Colon polyps Sister     2 sisters with polyps  . Diverticulitis Sister     1 sister  . Hypertension Mother   . Hypertension Brother   . Skin cancer Sister   . Diabetes Neg Hx   . Stroke Neg Hx     Allergies  Allergen Reactions  . Sulfonamide Derivatives     Rash Because of a history of  documented adverse serious drug reaction;Medi Alert bracelet  is recommended  . Tramadol Hcl     REACTION: cold sweats, weak, fatigue    Current Outpatient Prescriptions on File Prior to Visit  Medication Sig Dispense Refill  . aspirin 81 MG chewable tablet Chew 81 mg by mouth daily.      . benazepril (LOTENSIN) 40 MG tablet TAKE AS DIRECTED 1/2 TO 1 TABLET BY MOUTH EVERY DAY TO MAINTAIN BLOOD PRESSURE AVERAGE OF 130/85. 90 tablet 1  . bisoprolol-hydrochlorothiazide (ZIAC) 5-6.25 MG per tablet TAKE 1 TABLET BY MOUTH ONCE DAILY. 90 tablet 2  . Calcium Carbonate (CALTRATE 600 PO) Take 600 mg by mouth 2 (two) times daily.      . cholecalciferol (VITAMIN D) 1000 UNITS tablet Take 1,000 Units by mouth daily.      . fluticasone (FLONASE) 50 MCG/ACT nasal spray Place 2 sprays into both nostrils daily. 16 g 6  . Multiple Vitamin (MULTIVITAMIN PO) Take by mouth daily.      . Omega-3 Fatty Acids (FISH OIL) 1000 MG CAPS Take 1,000 mg by  mouth 2 (two) times daily.      Marland Kitchen omeprazole (PRILOSEC) 20 MG capsule Take 20 mg by mouth daily.      . vitamin E 400 UNIT capsule Take 400 Units by mouth daily.      . simvastatin (ZOCOR) 20 MG tablet Take 1 tablet (20 mg total) by mouth at bedtime. (Patient not taking: Reported on 09/17/2014) 90 tablet 0   No current facility-administered medications on file prior to visit.    BP 138/80 mmHg  Pulse 66  Temp(Src) 99.9 F (37.7 C) (Oral)  Ht 5' 1.5" (1.562 m)  Wt 180 lb 8 oz (81.874 kg)  BMI 33.56 kg/m2  SpO2 94%      Objective:   Physical Exam  Constitutional: She is oriented to person, place, and time. She appears well-developed and well-nourished.  Cardiovascular: Normal rate, regular rhythm, normal heart sounds and intact distal pulses.  Exam reveals no gallop and no friction rub.   No murmur heard. Pulmonary/Chest: Effort normal and breath sounds normal. No respiratory distress. She has no wheezes. She has no rales. She exhibits no tenderness.  Musculoskeletal: Normal range of motion. She exhibits no edema or tenderness.  Neurological: She is alert and oriented to person, place, and time. No cranial nerve deficit. Coordination normal.  Skin: Skin is warm and dry. No rash noted. She is not diaphoretic. No erythema. No pallor.  Psychiatric: She has a normal mood and affect. Her behavior is normal. Judgment and thought content normal.  Nursing note and vitals reviewed.       Assessment & Plan:

## 2014-10-01 NOTE — Assessment & Plan Note (Signed)
It has been at least 13 days with her URI. Will treat with seven days of Doxycycline 100mg  BID.  Follow up if not feeling better by the end of the holiday weekend Go to the ER with any allergic reaction.

## 2014-10-01 NOTE — Patient Instructions (Addendum)
I have sent a prescription of Doxycycline to the pharmacy. Please take it twice a day for seven days. Continue with the Flonase and take Zyrtec daily. Go to the ER with any allergic reactions. Follow up if not feeling better by Tuesday. I hope you feel better.    Sinusitis Sinusitis is redness, soreness, and inflammation of the paranasal sinuses. Paranasal sinuses are air pockets within the bones of your face (beneath the eyes, the middle of the forehead, or above the eyes). In healthy paranasal sinuses, mucus is able to drain out, and air is able to circulate through them by way of your nose. However, when your paranasal sinuses are inflamed, mucus and air can become trapped. This can allow bacteria and other germs to grow and cause infection. Sinusitis can develop quickly and last only a short time (acute) or continue over a long period (chronic). Sinusitis that lasts for more than 12 weeks is considered chronic.  CAUSES  Causes of sinusitis include:  Allergies.  Structural abnormalities, such as displacement of the cartilage that separates your nostrils (deviated septum), which can decrease the air flow through your nose and sinuses and affect sinus drainage.  Functional abnormalities, such as when the small hairs (cilia) that line your sinuses and help remove mucus do not work properly or are not present. SIGNS AND SYMPTOMS  Symptoms of acute and chronic sinusitis are the same. The primary symptoms are pain and pressure around the affected sinuses. Other symptoms include:  Upper toothache.  Earache.  Headache.  Bad breath.  Decreased sense of smell and taste.  A cough, which worsens when you are lying flat.  Fatigue.  Fever.  Thick drainage from your nose, which often is green and may contain pus (purulent).  Swelling and warmth over the affected sinuses. DIAGNOSIS  Your health care provider will perform a physical exam. During the exam, your health care provider may:  Look  in your nose for signs of abnormal growths in your nostrils (nasal polyps).  Tap over the affected sinus to check for signs of infection.  View the inside of your sinuses (endoscopy) using an imaging device that has a light attached (endoscope). If your health care provider suspects that you have chronic sinusitis, one or more of the following tests may be recommended:  Allergy tests.  Nasal culture. A sample of mucus is taken from your nose, sent to a lab, and screened for bacteria.  Nasal cytology. A sample of mucus is taken from your nose and examined by your health care provider to determine if your sinusitis is related to an allergy. TREATMENT  Most cases of acute sinusitis are related to a viral infection and will resolve on their own within 10 days. Sometimes medicines are prescribed to help relieve symptoms (pain medicine, decongestants, nasal steroid sprays, or saline sprays).  However, for sinusitis related to a bacterial infection, your health care provider will prescribe antibiotic medicines. These are medicines that will help kill the bacteria causing the infection.  Rarely, sinusitis is caused by a fungal infection. In theses cases, your health care provider will prescribe antifungal medicine. For some cases of chronic sinusitis, surgery is needed. Generally, these are cases in which sinusitis recurs more than 3 times per year, despite other treatments. HOME CARE INSTRUCTIONS   Drink plenty of water. Water helps thin the mucus so your sinuses can drain more easily.  Use a humidifier.  Inhale steam 3 to 4 times a day (for example, sit in the bathroom  with the shower running).  Apply a warm, moist washcloth to your face 3 to 4 times a day, or as directed by your health care provider.  Use saline nasal sprays to help moisten and clean your sinuses.  Take medicines only as directed by your health care provider.  If you were prescribed either an antibiotic or antifungal  medicine, finish it all even if you start to feel better. SEEK IMMEDIATE MEDICAL CARE IF:  You have increasing pain or severe headaches.  You have nausea, vomiting, or drowsiness.  You have swelling around your face.  You have vision problems.  You have a stiff neck.  You have difficulty breathing. MAKE SURE YOU:   Understand these instructions.  Will watch your condition.  Will get help right away if you are not doing well or get worse. Document Released: 04/23/2005 Document Revised: 09/07/2013 Document Reviewed: 05/08/2011 [facility] Patient Information 2015 [place], [place]. This information is not intended to replace advice given to you by your health care provider. Make sure you discuss any questions you have with your health care provider.

## 2014-10-01 NOTE — Progress Notes (Signed)
Pre visit review using our clinic review tool, if applicable. No additional management support is needed unless otherwise documented below in the visit note. 

## 2015-02-01 ENCOUNTER — Other Ambulatory Visit: Payer: Self-pay

## 2015-02-01 DIAGNOSIS — Z1231 Encounter for screening mammogram for malignant neoplasm of breast: Secondary | ICD-10-CM

## 2015-02-11 DIAGNOSIS — D225 Melanocytic nevi of trunk: Secondary | ICD-10-CM | POA: Diagnosis not present

## 2015-02-11 DIAGNOSIS — D2361 Other benign neoplasm of skin of right upper limb, including shoulder: Secondary | ICD-10-CM | POA: Diagnosis not present

## 2015-02-11 DIAGNOSIS — D2262 Melanocytic nevi of left upper limb, including shoulder: Secondary | ICD-10-CM | POA: Diagnosis not present

## 2015-02-11 DIAGNOSIS — D1801 Hemangioma of skin and subcutaneous tissue: Secondary | ICD-10-CM | POA: Diagnosis not present

## 2015-02-11 DIAGNOSIS — L570 Actinic keratosis: Secondary | ICD-10-CM | POA: Diagnosis not present

## 2015-02-11 DIAGNOSIS — D2272 Melanocytic nevi of left lower limb, including hip: Secondary | ICD-10-CM | POA: Diagnosis not present

## 2015-02-11 DIAGNOSIS — L821 Other seborrheic keratosis: Secondary | ICD-10-CM | POA: Diagnosis not present

## 2015-02-11 DIAGNOSIS — L720 Epidermal cyst: Secondary | ICD-10-CM | POA: Diagnosis not present

## 2015-02-11 DIAGNOSIS — D485 Neoplasm of uncertain behavior of skin: Secondary | ICD-10-CM | POA: Diagnosis not present

## 2015-02-11 DIAGNOSIS — D2271 Melanocytic nevi of right lower limb, including hip: Secondary | ICD-10-CM | POA: Diagnosis not present

## 2015-02-11 DIAGNOSIS — D2261 Melanocytic nevi of right upper limb, including shoulder: Secondary | ICD-10-CM | POA: Diagnosis not present

## 2015-02-11 DIAGNOSIS — D224 Melanocytic nevi of scalp and neck: Secondary | ICD-10-CM | POA: Diagnosis not present

## 2015-02-17 ENCOUNTER — Ambulatory Visit (INDEPENDENT_AMBULATORY_CARE_PROVIDER_SITE_OTHER): Payer: Medicare Other | Admitting: Internal Medicine

## 2015-02-17 ENCOUNTER — Other Ambulatory Visit (INDEPENDENT_AMBULATORY_CARE_PROVIDER_SITE_OTHER): Payer: Medicare Other

## 2015-02-17 ENCOUNTER — Encounter: Payer: Self-pay | Admitting: Internal Medicine

## 2015-02-17 VITALS — BP 132/88 | HR 61 | Temp 98.5°F | Resp 16 | Ht 62.0 in | Wt 198.0 lb

## 2015-02-17 DIAGNOSIS — D649 Anemia, unspecified: Secondary | ICD-10-CM

## 2015-02-17 DIAGNOSIS — E785 Hyperlipidemia, unspecified: Secondary | ICD-10-CM

## 2015-02-17 DIAGNOSIS — Z23 Encounter for immunization: Secondary | ICD-10-CM

## 2015-02-17 DIAGNOSIS — Z0000 Encounter for general adult medical examination without abnormal findings: Secondary | ICD-10-CM

## 2015-02-17 LAB — CBC
HCT: 41.1 % (ref 36.0–46.0)
HEMOGLOBIN: 13.7 g/dL (ref 12.0–15.0)
MCHC: 33.4 g/dL (ref 30.0–36.0)
MCV: 96 fl (ref 78.0–100.0)
Platelets: 175 10*3/uL (ref 150.0–400.0)
RBC: 4.28 Mil/uL (ref 3.87–5.11)
RDW: 12.9 % (ref 11.5–15.5)
WBC: 5.9 10*3/uL (ref 4.0–10.5)

## 2015-02-17 LAB — LIPID PANEL
Cholesterol: 231 mg/dL — ABNORMAL HIGH (ref 0–200)
HDL: 46 mg/dL (ref >39.00)
NONHDL: 185.01
Total CHOL/HDL Ratio: 5
Triglycerides: 290 mg/dL — ABNORMAL HIGH (ref 0.0–149.0)
VLDL: 58 mg/dL — AB (ref 0.0–40.0)

## 2015-02-17 LAB — COMPREHENSIVE METABOLIC PANEL
ALBUMIN: 4 g/dL (ref 3.5–5.2)
ALK PHOS: 56 U/L (ref 39–117)
ALT: 17 U/L (ref 0–35)
AST: 23 U/L (ref 0–37)
BILIRUBIN TOTAL: 0.8 mg/dL (ref 0.2–1.2)
BUN: 17 mg/dL (ref 6–23)
CO2: 34 mEq/L — ABNORMAL HIGH (ref 19–32)
Calcium: 9.6 mg/dL (ref 8.4–10.5)
Chloride: 100 mEq/L (ref 96–112)
Creatinine, Ser: 0.69 mg/dL (ref 0.40–1.20)
GFR: 89.42 mL/min (ref >60.00)
GLUCOSE: 93 mg/dL (ref 70–99)
Potassium: 4.3 mEq/L (ref 3.5–5.1)
SODIUM: 138 meq/L (ref 135–145)
TOTAL PROTEIN: 7.6 g/dL (ref 6.0–8.3)

## 2015-02-17 LAB — LDL CHOLESTEROL, DIRECT: Direct LDL: 113 mg/dL

## 2015-02-17 MED ORDER — BENAZEPRIL HCL 40 MG PO TABS: ORAL_TABLET | ORAL | Status: DC

## 2015-02-17 MED ORDER — BISOPROLOL-HYDROCHLOROTHIAZIDE 5-6.25 MG PO TABS: ORAL_TABLET | ORAL | Status: DC

## 2015-02-17 NOTE — Progress Notes (Signed)
   Subjective:    Patient ID: Kim Fuller, female    DOB: [birth year], 69 y.o.   MRN: [id number]  HPI Here for medicare wellness, no new complaints. Please see A/P for status and treatment of chronic medical problems.   Diet: heart healthy Physical activity: sedentary Depression/mood screen: negative Hearing: intact to whispered voice Visual acuity: grossly normal, performs annual eye exam  ADLs: capable Fall risk: none Home safety: good Cognitive evaluation: intact to orientation, naming, recall and repetition EOL planning: adv directives discussed not in place  I have personally reviewed and have noted 1. The patient's medical and social history - reviewed today no changes 2. Their use of alcohol, tobacco or illicit drugs 3. Their current medications and supplements 4. The patient's functional ability including ADL's, fall risks, home safety risks and hearing or visual impairment. 5. Diet and physical activities 6. Evidence for depression or mood disorders 7. Care team reviewed and updated (available in snapshot)  Review of Systems  Constitutional: Negative for fever, chills, activity change, appetite change, fatigue and unexpected weight change.  HENT: Negative for congestion, ear discharge, ear pain, postnasal drip, rhinorrhea, sinus pressure, sore throat and trouble swallowing.   Eyes: Negative.   Respiratory: Negative for cough, chest tightness, shortness of breath and wheezing.   Cardiovascular: Negative for chest pain, palpitations and leg swelling.  Gastrointestinal: Negative.   Musculoskeletal: Negative.   Skin: Negative.   Neurological: Negative.   Psychiatric/Behavioral: Negative.       Objective:   Physical Exam  Constitutional: She is oriented to person, place, and time. She appears well-developed and well-nourished.  HENT:  Head: Normocephalic and atraumatic.  Right Ear: External ear normal.  Left Ear: External ear normal.  Eyes: EOM are normal.  Neck:  Normal range of motion. No JVD present. No tracheal deviation present. No thyromegaly present.  Cardiovascular: Normal rate and regular rhythm.   Pulmonary/Chest: Effort normal and breath sounds normal. No respiratory distress. She has no wheezes. She has no rales.  Abdominal: Soft.  Musculoskeletal: She exhibits no edema.  Lymphadenopathy:    She has no cervical adenopathy.  Neurological: She is alert and oriented to person, place, and time. Coordination normal.  Skin: Skin is warm and dry.  Psychiatric: She has a normal mood and affect.   Filed Vitals:   02/17/15 1031  BP: 132/88  Pulse: 61  Temp: 98.5 F (36.9 C)  TempSrc: Oral  Resp: 16  Height: 5\' 2"  (1.575 m)  Weight: 198 lb (89.812 kg)  SpO2: 92%      Assessment & Plan:  Flu shot given at visit

## 2015-02-17 NOTE — Progress Notes (Signed)
Pre visit review using our clinic review tool, if applicable. No additional management support is needed unless otherwise documented below in the visit note. 

## 2015-02-17 NOTE — Patient Instructions (Signed)
We have given you the flu shot today.   We are checking the blood work and will call you back about the results.   Health Maintenance, Female Adopting a healthy lifestyle and getting preventive care can go a long way to promote health and wellness. Talk with your health care provider about what schedule of regular examinations is right for you. This is a good chance for you to check in with your provider about disease prevention and staying healthy. In between checkups, there are plenty of things you can do on your own. Experts have done a lot of research about which lifestyle changes and preventive measures are most likely to keep you healthy. Ask your health care provider for more information. WEIGHT AND DIET  Eat a healthy diet  Be sure to include plenty of vegetables, fruits, low-fat dairy products, and lean protein.  Do not eat a lot of foods high in solid fats, added sugars, or salt.  Get regular exercise. This is one of the most important things you can do for your health.  Most adults should exercise for at least 150 minutes each week. The exercise should increase your heart rate and make you sweat (moderate-intensity exercise).  Most adults should also do strengthening exercises at least twice a week. This is in addition to the moderate-intensity exercise.  Maintain a healthy weight  Body mass index (BMI) is a measurement that can be used to identify possible weight problems. It estimates body fat based on height and weight. Your health care provider can help determine your BMI and help you achieve or maintain a healthy weight.  For females 37 years of age and older:   A BMI below 18.5 is considered underweight.  A BMI of 18.5 to 24.9 is normal.  A BMI of 25 to 29.9 is considered overweight.  A BMI of 30 and above is considered obese.  Watch levels of cholesterol and blood lipids  You should start having your blood tested for lipids and cholesterol at 70 years of age,  then have this test every 5 years.  You may need to have your cholesterol levels checked more often if:  Your lipid or cholesterol levels are high.  You are older than 71 years of age.  You are at high risk for heart disease.  CANCER SCREENING   Lung Cancer  Lung cancer screening is recommended for adults 70-70 years old who are at high risk for lung cancer because of a history of smoking.  A yearly low-dose CT scan of the lungs is recommended for people who:  Currently smoke.  Have quit within the past 15 years.  Have at least a 30-pack-year history of smoking. A pack year is smoking an average of one pack of cigarettes a day for 1 year.  Yearly screening should continue until it has been 15 years since you quit.  Yearly screening should stop if you develop a health problem that would prevent you from having lung cancer treatment.  Breast Cancer  Practice breast self-awareness. This means understanding how your breasts normally appear and feel.  It also means doing regular breast self-exams. Let your health care provider know about any changes, no matter how small.  If you are in your 20s or 30s, you should have a clinical breast exam (CBE) by a health care provider every 1-3 years as part of a regular health exam.  If you are 30 or older, have a CBE every year. Also consider having a breast  X-ray (mammogram) every year.  If you have a family history of breast cancer, talk to your health care provider about genetic screening.  If you are at high risk for breast cancer, talk to your health care provider about having an MRI and a mammogram every year.  Breast cancer gene (BRCA) assessment is recommended for women who have family members with BRCA-related cancers. BRCA-related cancers include:  Breast.  Ovarian.  Tubal.  Peritoneal cancers.  Results of the assessment will determine the need for genetic counseling and BRCA1 and BRCA2 testing. Cervical Cancer Your  health care provider may recommend that you be screened regularly for cancer of the pelvic organs (ovaries, uterus, and vagina). This screening involves a pelvic examination, including checking for microscopic changes to the surface of your cervix (Pap test). You may be encouraged to have this screening done every 3 years, beginning at age 70.  For women ages 76-65, health care providers may recommend pelvic exams and Pap testing every 3 years, or they may recommend the Pap and pelvic exam, combined with testing for human papilloma virus (HPV), every 5 years. Some types of HPV increase your risk of cervical cancer. Testing for HPV may also be done on women of any age with unclear Pap test results.  Other health care providers may not recommend any screening for nonpregnant women who are considered low risk for pelvic cancer and who do not have symptoms. Ask your health care provider if a screening pelvic exam is right for you.  If you have had past treatment for cervical cancer or a condition that could lead to cancer, you need Pap tests and screening for cancer for at least 20 years after your treatment. If Pap tests have been discontinued, your risk factors (such as having a new sexual partner) need to be reassessed to determine if screening should resume. Some women have medical problems that increase the chance of getting cervical cancer. In these cases, your health care provider may recommend more frequent screening and Pap tests. Colorectal Cancer  This type of cancer can be detected and often prevented.  Routine colorectal cancer screening usually begins at 70 years of age and continues through 70 years of age.  Your health care provider may recommend screening at an earlier age if you have risk factors for colon cancer.  Your health care provider may also recommend using home test kits to check for hidden blood in the stool.  A small camera at the end of a tube can be used to examine your  colon directly (sigmoidoscopy or colonoscopy). This is done to check for the earliest forms of colorectal cancer.  Routine screening usually begins at age 70.  Direct examination of the colon should be repeated every 5-10 years through 70 years of age. However, you may need to be screened more often if early forms of precancerous polyps or small growths are found. Skin Cancer  Check your skin from head to toe regularly.  Tell your health care provider about any new moles or changes in moles, especially if there is a change in a mole's shape or color.  Also tell your health care provider if you have a mole that is larger than the size of a pencil eraser.  Always use sunscreen. Apply sunscreen liberally and repeatedly throughout the day.  Protect yourself by wearing long sleeves, pants, a wide-brimmed hat, and sunglasses whenever you are outside. HEART DISEASE, DIABETES, AND HIGH BLOOD PRESSURE   High blood pressure causes heart  disease and increases the risk of stroke. High blood pressure is more likely to develop in:  People who have blood pressure in the high end of the normal range (130-139/85-89 mm Hg).  People who are overweight or obese.  People who are African American.  If you are 81-6 years of age, have your blood pressure checked every 3-5 years. If you are 69 years of age or older, have your blood pressure checked every year. You should have your blood pressure measured twice--once when you are at a hospital or clinic, and once when you are not at a hospital or clinic. Record the average of the two measurements. To check your blood pressure when you are not at a hospital or clinic, you can use:  An automated blood pressure machine at a pharmacy.  A home blood pressure monitor.  If you are between 58 years and 61 years old, ask your health care provider if you should take aspirin to prevent strokes.  Have regular diabetes screenings. This involves taking a blood sample to  check your fasting blood sugar level.  If you are at a normal weight and have a low risk for diabetes, have this test once every three years after 70 years of age.  If you are overweight and have a high risk for diabetes, consider being tested at a younger age or more often. PREVENTING INFECTION  Hepatitis B  If you have a higher risk for hepatitis B, you should be screened for this virus. You are considered at high risk for hepatitis B if:  You were born in a country where hepatitis B is common. Ask your health care provider which countries are considered high risk.  Your parents were born in a high-risk country, and you have not been immunized against hepatitis B (hepatitis B vaccine).  You have HIV or AIDS.  You use needles to inject street drugs.  You live with someone who has hepatitis B.  You have had sex with someone who has hepatitis B.  You get hemodialysis treatment.  You take certain medicines for conditions, including cancer, organ transplantation, and autoimmune conditions. Hepatitis C  Blood testing is recommended for:  Everyone born from [birth year] through 1966.  Anyone with known risk factors for hepatitis C. Sexually transmitted infections (STIs)  You should be screened for sexually transmitted infections (STIs) including gonorrhea and chlamydia if:  You are sexually active and are younger than 70 years of age.  You are older than 70 years of age and your health care provider tells you that you are at risk for this type of infection.  Your sexual activity has changed since you were last screened and you are at an increased risk for chlamydia or gonorrhea. Ask your health care provider if you are at risk.  If you do not have HIV, but are at risk, it may be recommended that you take a prescription medicine daily to prevent HIV infection. This is called pre-exposure prophylaxis (PrEP). You are considered at risk if:  You are sexually active and do not regularly use  condoms or know the HIV status of your partner(s).  You take drugs by injection.  You are sexually active with a partner who has HIV. Talk with your health care provider about whether you are at high risk of being infected with HIV. If you choose to begin PrEP, you should first be tested for HIV. You should then be tested every 3 months for as long as you are taking  PrEP.  PREGNANCY   If you are premenopausal and you may become pregnant, ask your health care provider about preconception counseling.  If you may become pregnant, take 400 to 800 micrograms (mcg) of folic acid every day.  If you want to prevent pregnancy, talk to your health care provider about birth control (contraception). OSTEOPOROSIS AND MENOPAUSE   Osteoporosis is a disease in which the bones lose minerals and strength with aging. This can result in serious bone fractures. Your risk for osteoporosis can be identified using a bone density scan.  If you are 65 years of age or older, or if you are at risk for osteoporosis and fractures, ask your health care provider if you should be screened.  Ask your health care provider whether you should take a calcium or vitamin D supplement to lower your risk for osteoporosis.  Menopause may have certain physical symptoms and risks.  Hormone replacement therapy may reduce some of these symptoms and risks. Talk to your health care provider about whether hormone replacement therapy is right for you.  HOME CARE INSTRUCTIONS   Schedule regular health, dental, and eye exams.  Stay current with your immunizations.   Do not use any tobacco products including cigarettes, chewing tobacco, or electronic cigarettes.  If you are pregnant, do not drink alcohol.  If you are breastfeeding, limit how much and how often you drink alcohol.  Limit alcohol intake to no more than 1 drink per day for nonpregnant women. One drink equals 12 ounces of beer, 5 ounces of wine, or 1 ounces of hard  liquor.  Do not use street drugs.  Do not share needles.  Ask your health care provider for help if you need support or information about quitting drugs.  Tell your health care provider if you often feel depressed.  Tell your health care provider if you have ever been abused or do not feel safe at home.   This information is not intended to replace advice given to you by your health care provider. Make sure you discuss any questions you have with your health care provider.   Document Released: 11/06/2010 Document Revised: 05/14/2014 Document Reviewed: 03/25/2013 Elsevier Interactive Patient Education 2016 [employer]  

## 2015-02-20 DIAGNOSIS — Z0000 Encounter for general adult medical examination without abnormal findings: Secondary | ICD-10-CM | POA: Insufficient documentation

## 2015-02-20 NOTE — Assessment & Plan Note (Signed)
Given flu shot at visit and needs prevnar but wants to do separately. Colonoscopy and mammogram up to date. Aged out of pap smears. Talked to her about the needs for sunscreen and protective clothing for skin cancer risk reduction given her history of many removed lesion. Given 10 year screening recommendations at visit.

## 2015-03-12 DIAGNOSIS — Z23 Encounter for immunization: Secondary | ICD-10-CM | POA: Diagnosis not present

## 2015-03-17 ENCOUNTER — Ambulatory Visit
Admission: RE | Admit: 2015-03-17 | Discharge: 2015-03-17 | Disposition: A | Discharge status: Home / Self Care | Payer: Medicare Other | Source: Ambulatory Visit

## 2015-03-17 DIAGNOSIS — Z1231 Encounter for screening mammogram for malignant neoplasm of breast: Secondary | ICD-10-CM

## 2015-03-18 DIAGNOSIS — N952 Postmenopausal atrophic vaginitis: Secondary | ICD-10-CM | POA: Diagnosis not present

## 2015-04-22 ENCOUNTER — Encounter: Payer: Self-pay | Admitting: Internal Medicine

## 2015-04-22 ENCOUNTER — Ambulatory Visit (INDEPENDENT_AMBULATORY_CARE_PROVIDER_SITE_OTHER)
Admission: RE | Admit: 2015-04-22 | Discharge: 2015-04-22 | Disposition: A | Discharge status: Home / Self Care | Payer: Medicare Other | Source: Ambulatory Visit | Attending: Internal Medicine | Admitting: Internal Medicine

## 2015-04-22 ENCOUNTER — Ambulatory Visit (INDEPENDENT_AMBULATORY_CARE_PROVIDER_SITE_OTHER): Payer: Medicare Other | Admitting: Internal Medicine

## 2015-04-22 VITALS — BP 136/80 | HR 55 | Temp 98.4°F | Resp 14 | Ht 62.5 in | Wt 206.0 lb

## 2015-04-22 DIAGNOSIS — M1612 Unilateral primary osteoarthritis, left hip: Secondary | ICD-10-CM | POA: Diagnosis not present

## 2015-04-22 DIAGNOSIS — Z780 Asymptomatic menopausal state: Secondary | ICD-10-CM | POA: Diagnosis not present

## 2015-04-22 DIAGNOSIS — E782 Mixed hyperlipidemia: Secondary | ICD-10-CM

## 2015-04-22 DIAGNOSIS — M8580 Other specified disorders of bone density and structure, unspecified site: Secondary | ICD-10-CM

## 2015-04-22 DIAGNOSIS — M25552 Pain in left hip: Secondary | ICD-10-CM

## 2015-04-22 MED ORDER — METHYLPREDNISOLONE ACETATE 40 MG/ML IJ SUSP
40.0000 mg | Freq: Once | INTRAMUSCULAR | Status: AC
  Administered 2015-04-22: 40 mg via INTRAMUSCULAR

## 2015-04-22 MED ORDER — PRAVASTATIN SODIUM 20 MG PO TABS: 20.0000 mg | ORAL_TABLET | Freq: Every day | ORAL | Status: DC

## 2015-04-22 MED ORDER — CYCLOBENZAPRINE HCL 5 MG PO TABS
5.0000 mg | ORAL_TABLET | Freq: Three times a day (TID) | ORAL | Status: DC | PRN
Start: 2015-04-22 — End: 2016-10-26

## 2015-04-22 NOTE — Assessment & Plan Note (Signed)
Ordered bone density scan today at visit. Takes calcium and vitamin D daily and does weight bearing exercise.

## 2015-04-22 NOTE — Patient Instructions (Signed)
We have sent in the flexeril for pain that you can use 3 times a day as needed. It should not make you drowsy or sleepy.   We have sent in the pravastatin to replace the simvastatin.   We will check the x-ray of the hip today and you will hear back about the bone density.   Trochanteric Bursitis You have hip pain due to trochanteric bursitis. Bursitis means that the sack near the outside of the hip is filled with fluid and inflamed. This sack is made up of protective soft tissue. The pain from trochanteric bursitis can be severe and keep you from sleep. It can radiate to the buttocks or down the outside of the thigh to the knee. The pain is almost always worse when rising from the seated or lying position and with walking. Pain can improve after you take a few steps. It happens more often in people with hip joint and lumbar spine problems, such as arthritis or previous surgery. Very rarely the trochanteric bursa can become infected, and antibiotics and/or surgery may be needed. Treatment often includes an injection of local anesthetic mixed with cortisone medicine. This medicine is injected into the area where it is most tender over the hip. Repeat injections may be necessary if the response to treatment is slow. You can apply ice packs over the tender area for 30 minutes every 2 hours for the next few days. Anti-inflammatory and/or narcotic pain medicine may also be helpful. Limit your activity for the next few days if the pain continues. See your caregiver in 5-10 days if you are not greatly improved.  SEEK IMMEDIATE MEDICAL CARE IF:  You develop severe pain, fever, or increased redness.  You have pain that radiates below the knee. EXERCISES STRETCHING EXERCISES - Trochanteric Bursitis  These exercises may help you when beginning to rehabilitate your injury. Your symptoms may resolve with or without further involvement from your physician, physical therapist, or athletic trainer. While completing  these exercises, remember:   Restoring tissue flexibility helps normal motion to return to the joints. This allows healthier, less painful movement and activity.  An effective stretch should be held for at least 30 seconds.  A stretch should never be painful. You should only feel a gentle lengthening or release in the stretched tissue. STRETCH - Iliotibial Band  On the floor or bed, lie on your side so your injured leg is on top. Bend your knee and grab your ankle.  Slowly bring your knee back so that your thigh is in line with your trunk. Keep your heel at your buttocks and gently arch your back so your head, shoulders and hips line up.  Slowly lower your leg so that your knee approaches the floor/bed until you feel a gentle stretch on the outside of your thigh. If you do not feel a stretch and your knee will not fall farther, place the heel of your opposite foot on top of your knee and pull your thigh down farther.  Hold this stretch for __________ seconds.  Repeat __________ times. Complete this exercise __________ times per day. STRETCH - Hamstrings, Supine   Lie on your back. Loop a belt or towel over the ball of your foot as shown.  Straighten your knee and slowly pull on the belt to raise your injured leg. Do not allow the knee to bend. Keep your opposite leg flat on the floor.  Raise the leg until you feel a gentle stretch behind your knee or thigh.  Hold this position for __________ seconds.  Repeat __________ times. Complete this stretch __________ times per day. STRETCH - Quadriceps, Prone   Lie on your stomach on a firm surface, such as a bed or padded floor.  Bend your knee and grasp your ankle. If you are unable to reach your ankle or pant leg, use a belt around your foot to lengthen your reach.  Gently pull your heel toward your buttocks. Your knee should not slide out to the side. You should feel a stretch in the front of your thigh and/or knee.  Hold this position  for __________ seconds.  Repeat __________ times. Complete this stretch __________ times per day. STRETCHING - Hip Flexors, Lunge Half kneel with your knee on the floor and your opposite knee bent and directly over your ankle.  Keep good posture with your head over your shoulders. Tighten your buttocks to point your tailbone downward; this will prevent your back from arching too much.  You should feel a gentle stretch in the front of your thigh and/or hip. If you do not feel any resistance, slightly slide your opposite foot forward and then slowly lunge forward so your knee once again lines up over your ankle. Be sure your tailbone remains pointed downward.  Hold this stretch for __________ seconds.  Repeat __________ times. Complete this stretch __________ times per day. STRETCH - Adductors, Lunge  While standing, spread your legs.  Lean away from your injured leg by bending your opposite knee. You may rest your hands on your thigh for balance.  You should feel a stretch in your inner thigh. Hold for __________ seconds.  Repeat __________ times. Complete this exercise __________ times per day.   This information is not intended to replace advice given to you by your health care provider. Make sure you discuss any questions you have with your health care provider.   Document Released: 05/31/2004 Document Revised: 09/07/2014 Document Reviewed: 08/05/2008 Elsevier Interactive Patient Education [employer].

## 2015-04-22 NOTE — Progress Notes (Signed)
   Subjective:    Patient ID: Kim Fuller, female    DOB: 06/22/1945, 69 y.o.   MRN: [id number]  HPI The patient is a 69 YO female coming in for left hip pain for 2 weeks. Worse with prolonged sitting then activity. Tender to the touch on the side of the thigh. No fevers or chills. No injury. Has tried aleve and tylenol without relief.   Review of Systems  Constitutional: Negative for fever, chills, activity change, appetite change, fatigue and unexpected weight change.  Respiratory: Negative for cough, chest tightness, shortness of breath and wheezing.   Cardiovascular: Negative for chest pain, palpitations and leg swelling.  Gastrointestinal: Negative.   Musculoskeletal: Positive for myalgias and arthralgias. Negative for back pain and gait problem.  Skin: Negative.   Neurological: Negative.   Psychiatric/Behavioral: Negative.       Objective:   Physical Exam  Constitutional: She is oriented to person, place, and time. She appears well-developed and well-nourished.  HENT:  Head: Normocephalic and atraumatic.  Right Ear: External ear normal.  Left Ear: External ear normal.  Eyes: EOM are normal.  Neck: Normal range of motion. No JVD present. No tracheal deviation present. No thyromegaly present.  Cardiovascular: Normal rate and regular rhythm.   Pulmonary/Chest: Effort normal and breath sounds normal. No respiratory distress. She has no wheezes. She has no rales.  Abdominal: Soft.  Musculoskeletal: She exhibits no edema.  Tenderness on the lateral aspect of the left thigh, no tenderness in the groin.   Lymphadenopathy:    She has no cervical adenopathy.  Neurological: She is alert and oriented to person, place, and time. Coordination normal.  Skin: Skin is warm and dry.  Psychiatric: She has a normal mood and affect.   Filed Vitals:   04/22/15 1409  BP: 136/80  Pulse: 55  Temp: 98.4 F (36.9 C)  TempSrc: Oral  Resp: 14  Height: 5' 2.5" (1.588 m)  Weight: 206 lb  (93.441 kg)  SpO2: 94%      Assessment & Plan:  Depo-medrol 40 mg IM given at visit.

## 2015-04-22 NOTE — Assessment & Plan Note (Signed)
Left hip x-ray to rule out arthritis. Given the stiffness it is possible that this is arthritis but sounds to be more bursitis with tenderness on exam. Given depo-medrol IM today and rx for flexeril for pain. Given rehab stretching for the trochanteric bursitis.

## 2015-04-22 NOTE — Progress Notes (Signed)
Pre visit review using our clinic review tool, if applicable. No additional management support is needed unless otherwise documented below in the visit note. 

## 2015-04-22 NOTE — Assessment & Plan Note (Signed)
Stopped taking her simvastatin due to myalgias. Tolerated pravastatin in the past and will prescribe again. Last LDL increased and triglycerides increased as well.

## 2015-05-19 DIAGNOSIS — Z961 Presence of intraocular lens: Secondary | ICD-10-CM | POA: Diagnosis not present

## 2015-05-19 DIAGNOSIS — H26492 Other secondary cataract, left eye: Secondary | ICD-10-CM | POA: Diagnosis not present

## 2015-06-01 DIAGNOSIS — H2640 Unspecified secondary cataract: Secondary | ICD-10-CM | POA: Diagnosis not present

## 2015-06-01 DIAGNOSIS — H26492 Other secondary cataract, left eye: Secondary | ICD-10-CM | POA: Diagnosis not present

## 2015-06-07 ENCOUNTER — Ambulatory Visit (INDEPENDENT_AMBULATORY_CARE_PROVIDER_SITE_OTHER): Payer: Medicare Other | Admitting: Internal Medicine

## 2015-06-07 ENCOUNTER — Encounter: Payer: Self-pay | Admitting: Internal Medicine

## 2015-06-07 VITALS — BP 150/78 | HR 73 | Wt 212.0 lb

## 2015-06-07 DIAGNOSIS — M25562 Pain in left knee: Secondary | ICD-10-CM | POA: Insufficient documentation

## 2015-06-07 DIAGNOSIS — S8012XA Contusion of left lower leg, initial encounter: Secondary | ICD-10-CM | POA: Diagnosis not present

## 2015-06-07 DIAGNOSIS — S93401A Sprain of unspecified ligament of right ankle, initial encounter: Secondary | ICD-10-CM | POA: Diagnosis not present

## 2015-06-07 DIAGNOSIS — W010XXA Fall on same level from slipping, tripping and stumbling without subsequent striking against object, initial encounter: Secondary | ICD-10-CM

## 2015-06-07 DIAGNOSIS — S93409A Sprain of unspecified ligament of unspecified ankle, initial encounter: Secondary | ICD-10-CM | POA: Insufficient documentation

## 2015-06-07 DIAGNOSIS — W1849XA Other slipping, tripping and stumbling without falling, initial encounter: Secondary | ICD-10-CM | POA: Diagnosis not present

## 2015-06-07 NOTE — Progress Notes (Signed)
Subjective:  Patient ID: Kim Fuller, female    DOB: 23-Feb-1946  Age: 70 y.o. 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 Pt tripped over a rubber mat and fell hitting her L leg and L arm. No LOC. C/o pain in R ankle, L shin, L knee cap, L elbow.  Outpatient Prescriptions Prior to Visit  Medication Sig Dispense Refill  . aspirin 81 MG chewable tablet Chew 81 mg by mouth daily.      . benazepril (LOTENSIN) 40 MG tablet TAKE AS DIRECTED 1/2 TO 1 TABLET BY MOUTH EVERY DAY TO MAINTAIN BLOOD PRESSURE AVERAGE OF 130/85. 90 tablet 3  . bisoprolol-hydrochlorothiazide (ZIAC) 5-6.25 MG tablet TAKE 1 TABLET BY MOUTH ONCE DAILY. 90 tablet 3  . Calcium Carbonate (CALTRATE 600 PO) Take 600 mg by mouth 2 (two) times daily.      . cholecalciferol (VITAMIN D) 1000 UNITS tablet Take 1,000 Units by mouth daily.      . fluticasone (FLONASE) 50 MCG/ACT nasal spray Place 2 sprays into both nostrils daily. 16 g 6  . Multiple Vitamin (MULTIVITAMIN PO) Take by mouth daily.      . Omega-3 Fatty Acids (FISH OIL) 1000 MG CAPS Take 1,000 mg by mouth 2 (two) times daily.      Marland Kitchen omeprazole (PRILOSEC) 20 MG capsule Take 20 mg by mouth daily.      . pravastatin (PRAVACHOL) 20 MG tablet Take 1 tablet (20 mg total) by mouth daily. 90 tablet 3  . vitamin E 400 UNIT capsule Take 400 Units by mouth daily.      . cyclobenzaprine (FLEXERIL) 5 MG tablet Take 1 tablet (5 mg total) by mouth 3 (three) times daily as needed (pain). (Patient not taking: Reported on 06/07/2015) 60 tablet 1   No facility-administered medications prior to visit.    ROS Review of Systems  Constitutional: Negative for chills, activity change, appetite change, fatigue and unexpected weight change.  HENT: Negative for congestion, mouth sores and sinus pressure.   Eyes: Negative for visual disturbance.  Respiratory: Negative for cough and  chest tightness.   Gastrointestinal: Negative for nausea and abdominal pain.  Genitourinary: Negative for frequency, difficulty urinating and vaginal pain.  Musculoskeletal: Negative for back pain, joint swelling and gait problem.  Skin: Negative for pallor, rash and wound.  Neurological: Negative for dizziness, tremors, weakness, numbness and headaches.  Psychiatric/Behavioral: Negative for confusion and sleep disturbance. The patient is not nervous/anxious.   L prox anter shin w/6 cm hematoma L knee and L elbow - tender w/ROM R ankle - swollen - pain w/ROM  Objective:  BP 150/78 mmHg  Pulse 73  Wt 212 lb (96.163 kg)  SpO2 91%  BP Readings from Last 3 Encounters:  06/07/15 150/78  04/22/15 136/80  02/17/15 132/88    Wt Readings from Last 3 Encounters:  06/07/15 212 lb (96.163 kg)  04/22/15 206 lb (93.441 kg)  02/17/15 198 lb (89.812 kg)    Physical Exam  Constitutional: She appears well-developed. No distress.  HENT:  Head: Normocephalic.  Right Ear: External ear normal.  Left Ear: External ear normal.  Nose: Nose normal.  Mouth/Throat: Oropharynx is clear and moist.  Eyes: Conjunctivae are normal. Pupils are equal, round, and reactive to light. Right eye exhibits no discharge. Left eye exhibits no discharge.  Neck: Normal range of motion.  Neck supple. No JVD present. No tracheal deviation present. No thyromegaly present.  Cardiovascular: Normal rate, regular rhythm and normal heart sounds.   Pulmonary/Chest: No stridor. No respiratory distress. She has no wheezes.  Abdominal: Soft. Bowel sounds are normal. She exhibits no distension and no mass. There is no tenderness. There is no rebound and no guarding.  Musculoskeletal: She exhibits tenderness. She exhibits no edema.  Lymphadenopathy:    She has no cervical adenopathy.  Neurological: She displays normal reflexes. No cranial nerve deficit. She exhibits normal muscle tone. Coordination normal.  Skin: No rash noted. No  erythema.  Psychiatric: She has a normal mood and affect. Her behavior is normal. Judgment and thought content normal.    Lab Results  Component Value Date   WBC 5.9 02/17/2015   HGB 13.7 02/17/2015   HCT 41.1 02/17/2015   PLT 175.0 02/17/2015   GLUCOSE 93 02/17/2015   CHOL 231* 02/17/2015   TRIG 290.0* 02/17/2015   HDL 46.00 02/17/2015   LDLDIRECT 113.0 02/17/2015   LDLCALC 86 02/05/2014   ALT 17 02/17/2015   AST 23 02/17/2015   NA 138 02/17/2015   K 4.3 02/17/2015   CL 100 02/17/2015   CREATININE 0.69 02/17/2015   BUN 17 02/17/2015   CO2 34* 02/17/2015   TSH 1.56 02/05/2014   HGBA1C 5.6 11/11/2008    Dg Hip Unilat With Pelvis 2-3 Views Left  04/22/2015  CLINICAL DATA:  Posterior left hip pain EXAM: DG HIP (WITH OR WITHOUT PELVIS) 2-3V LEFT COMPARISON:  None. FINDINGS: No fracture or dislocation is seen. Mild degenerative changes. Visualized bony pelvis appears intact. IMPRESSION: No fracture or dislocation is seen. Mild degenerative changes. Electronically Signed   By: Julian Hy M.D.   On: 04/22/2015 14:59    Assessment & Plan:   Kim Fuller was seen today for fall, knee pain and foot pain.  Diagnoses and all orders for this visit:  Contusion of leg, left, initial encounter  Sprain of ankle, right, initial encounter  Fall due to stumbling, initial encounter   I am having Kim Fuller maintain her aspirin, Calcium Carbonate (CALTRATE 600 PO), Fish Oil, Multiple Vitamin (MULTIVITAMIN PO), vitamin E, cholecalciferol, omeprazole, fluticasone, benazepril, bisoprolol-hydrochlorothiazide, pravastatin, and cyclobenzaprine.  No orders of the defined types were placed in this encounter.     Follow-up: Return in about 2 weeks (around 06/21/2015) for a follow-up visit.  Walker Kehr, MD

## 2015-06-07 NOTE — Progress Notes (Signed)
Pre visit review using our clinic review tool, if applicable. No additional management support is needed unless otherwise documented below in the visit note. 

## 2015-06-07 NOTE — Patient Instructions (Signed)
Ankle Sprain °An ankle sprain is an injury to the strong, fibrous tissues (ligaments) that hold the bones of your ankle joint together.  °CAUSES °An ankle sprain is usually caused by a fall or by twisting your ankle. Ankle sprains most commonly occur when you step on the outer edge of your foot, and your ankle turns inward. People who participate in sports are more prone to these types of injuries.  °SYMPTOMS  °· Pain in your ankle. The pain may be present at rest or only when you are trying to stand or walk. °· Swelling. °· Bruising. Bruising may develop immediately or within 1 to 2 days after your injury. °· Difficulty standing or walking, particularly when turning corners or changing directions. °DIAGNOSIS  °Your caregiver will ask you details about your injury and perform a physical exam of your ankle to determine if you have an ankle sprain. During the physical exam, your caregiver will press on and apply pressure to specific areas of your foot and ankle. Your caregiver will try to move your ankle in certain ways. An X-ray exam may be done to be sure a bone was not broken or a ligament did not separate from one of the bones in your ankle (avulsion fracture).  °TREATMENT  °Certain types of braces can help stabilize your ankle. Your caregiver can make a recommendation for this. Your caregiver may recommend the use of medicine for pain. If your sprain is severe, your caregiver may refer you to a surgeon who helps to restore function to parts of your skeletal system (orthopedist) or a physical therapist. °HOME CARE INSTRUCTIONS  °· Apply ice to your injury for 1-2 days or as directed by your caregiver. Applying ice helps to reduce inflammation and pain. °¨ Put ice in a plastic bag. °¨ Place a towel between your skin and the bag. °¨ Leave the ice on for 15-20 minutes at a time, every 2 hours while you are awake. °· Only take over-the-counter or prescription medicines for pain, discomfort, or fever as directed by  your caregiver. °· Elevate your injured ankle above the level of your heart as much as possible for 2-3 days. °· If your caregiver recommends crutches, use them as instructed. Gradually put weight on the affected ankle. Continue to use crutches or a cane until you can walk without feeling pain in your ankle. °· If you have a plaster splint, wear the splint as directed by your caregiver. Do not rest it on anything harder than a pillow for the first 24 hours. Do not put weight on it. Do not get it wet. You may take it off to take a shower or bath. °· You may have been given an elastic bandage to wear around your ankle to provide support. If the elastic bandage is too tight (you have numbness or tingling in your foot or your foot becomes cold and blue), adjust the bandage to make it comfortable. °· If you have an air splint, you may blow more air into it or let air out to make it more comfortable. You may take your splint off at night and before taking a shower or bath. Wiggle your toes in the splint several times per day to decrease swelling. °SEEK MEDICAL CARE IF:  °· You have rapidly increasing bruising or swelling. °· Your toes feel extremely cold or you lose feeling in your foot. °· Your pain is not relieved with medicine. °SEEK IMMEDIATE MEDICAL CARE IF: °· Your toes are numb or blue. °·   You have severe pain that is increasing. °MAKE SURE YOU:  °· Understand these instructions. °· Will watch your condition. °· Will get help right away if you are not doing well or get worse. °  °This information is not intended to replace advice given to you by your health care provider. Make sure you discuss any questions you have with your health care provider. °  °Document Released: 04/23/2005 Document Revised: 05/14/2014 Document Reviewed: 05/05/2011 °Elsevier Interactive Patient Education ©2016 [employer] ° °Contusion °A contusion is a deep bruise. Contusions are the result of a blunt injury to tissues and muscle fibers  under the skin. The injury causes bleeding under the skin. The skin overlying the contusion may turn blue, purple, or yellow. Minor injuries will give you a painless contusion, but more severe contusions may stay painful and swollen for a few weeks.  °CAUSES  °This condition is usually caused by a blow, trauma, or direct force to an area of the body. °SYMPTOMS  °Symptoms of this condition include: °· Swelling of the injured area. °· Pain and tenderness in the injured area. °· Discoloration. The area may have redness and then turn blue, purple, or yellow. °DIAGNOSIS  °This condition is diagnosed based on a physical exam and medical history. An X-ray, CT scan, or MRI may be needed to determine if there are any associated injuries, such as broken bones (fractures). °TREATMENT  °Specific treatment for this condition depends on what area of the body was injured. In general, the best treatment for a contusion is resting, icing, applying pressure to (compression), and elevating the injured area. This is often called the RICE strategy. Over-the-counter anti-inflammatory medicines may also be recommended for pain control.  °HOME CARE INSTRUCTIONS  °· Rest the injured area. °· If directed, apply ice to the injured area: °¨ Put ice in a plastic bag. °¨ Place a towel between your skin and the bag. °¨ Leave the ice on for 20 minutes, 2-3 times per day. °· If directed, apply light compression to the injured area using an elastic bandage. Make sure the bandage is not wrapped too tightly. Remove and reapply the bandage as directed by your health care provider. °· If possible, raise (elevate) the injured area above the level of your heart while you are sitting or lying down. °· Take over-the-counter and prescription medicines only as told by your health care provider. °SEEK MEDICAL CARE IF: °· Your symptoms do not improve after several days of treatment. °· Your symptoms get worse. °· You have difficulty moving the injured  area. °SEEK IMMEDIATE MEDICAL CARE IF:  °· You have severe pain. °· You have numbness in a hand or foot. °· Your hand or foot turns pale or cold. °  °This information is not intended to replace advice given to you by your health care provider. Make sure you discuss any questions you have with your health care provider. °  °Document Released: 01/31/2005 Document Revised: 01/12/2015 Document Reviewed: 09/08/2014 °Elsevier Interactive Patient Education ©2016 [employer] Inc[employer] ° °

## 2015-06-23 ENCOUNTER — Ambulatory Visit (INDEPENDENT_AMBULATORY_CARE_PROVIDER_SITE_OTHER)
Admission: RE | Admit: 2015-06-23 | Discharge: 2015-06-23 | Disposition: A | Discharge status: Home / Self Care | Payer: Medicare Other | Source: Ambulatory Visit | Attending: Internal Medicine | Admitting: Internal Medicine

## 2015-06-23 ENCOUNTER — Ambulatory Visit (INDEPENDENT_AMBULATORY_CARE_PROVIDER_SITE_OTHER): Payer: Medicare Other | Admitting: Internal Medicine

## 2015-06-23 ENCOUNTER — Encounter: Payer: Self-pay | Admitting: Internal Medicine

## 2015-06-23 VITALS — BP 148/92 | HR 58 | Temp 97.8°F | Resp 12 | Ht 62.0 in | Wt 211.0 lb

## 2015-06-23 DIAGNOSIS — M25562 Pain in left knee: Secondary | ICD-10-CM

## 2015-06-23 NOTE — Assessment & Plan Note (Signed)
Suspect arthritis could be the underlying problems. Symptoms match. Checking x-ray left knee and if arthritis will have her see sports medicine for some steroid shots. Never had before. We discussed the fact that her weight also impacts on how much pressure is on her knees and that losing 1 pound takes 4 pounds of pressure off the knee.

## 2015-06-23 NOTE — Patient Instructions (Signed)
We will check the x-ray of the knee to see if there is any arthritis in it.   If there is we will get you into the sports medicine doctor to try some shots in the knee to help with the pain.

## 2015-06-23 NOTE — Progress Notes (Signed)
   Subjective:    Patient ID: Kim Fuller, female    DOB: [birth year], 70 y.o.   MRN: [id number]  HPI The patient is a 70 YO female coming in for left knee pain. She fell about 2-3 weeks ago (tripped on a mattress). She had extensive bruising around her knee which is better. She is still having a lot of left knee pain. She has had it off an on for several years but worsening since the fall. Better with walking around then hurts severely when she is resting at home. The knee has not collapsed on her. Denies swelling or additional fall or injury.   Review of Systems  Constitutional: Negative for fever, chills, activity change, appetite change, fatigue and unexpected weight change.  Respiratory: Negative for cough, chest tightness, shortness of breath and wheezing.   Cardiovascular: Negative for chest pain, palpitations and leg swelling.  Gastrointestinal: Negative.   Musculoskeletal: Positive for myalgias and arthralgias. Negative for back pain and gait problem.  Skin: Negative.   Neurological: Negative.   Psychiatric/Behavioral: Negative.       Objective:   Physical Exam  Constitutional: She is oriented to person, place, and time. She appears well-developed and well-nourished.  HENT:  Head: Normocephalic and atraumatic.  Right Ear: External ear normal.  Left Ear: External ear normal.  Eyes: EOM are normal.  Neck: Normal range of motion. No JVD present. No tracheal deviation present. No thyromegaly present.  Cardiovascular: Normal rate and regular rhythm.   Pulmonary/Chest: Effort normal and breath sounds normal. No respiratory distress. She has no wheezes. She has no rales.  Abdominal: Soft.  Musculoskeletal: She exhibits no edema.  Tenderness in the knee, no pre-patellar bursitis, ACL and PCL intact left knee. No joint swelling.   Lymphadenopathy:    She has no cervical adenopathy.  Neurological: She is alert and oriented to person, place, and time. Coordination normal.  Skin:  Skin is warm and dry.  Psychiatric: She has a normal mood and affect.   Filed Vitals:   06/23/15 0927  BP: 148/92  Pulse: 58  Temp: 97.8 F (36.6 C)  TempSrc: Oral  Resp: 12  Height: 5\' 2"  (1.575 m)  Weight: 211 lb (95.709 kg)  SpO2: 97%      Assessment & Plan:

## 2015-06-23 NOTE — Progress Notes (Signed)
Pre visit review using our clinic review tool, if applicable. No additional management support is needed unless otherwise documented below in the visit note. 

## 2015-07-21 ENCOUNTER — Inpatient Hospital Stay: Admission: RE | Admit: 2015-07-21 | Payer: Medicare Other | Source: Ambulatory Visit

## 2015-07-29 ENCOUNTER — Ambulatory Visit (INDEPENDENT_AMBULATORY_CARE_PROVIDER_SITE_OTHER)
Admission: RE | Admit: 2015-07-29 | Discharge: 2015-07-29 | Disposition: A | Discharge status: Home / Self Care | Payer: Medicare Other | Source: Ambulatory Visit | Attending: Internal Medicine | Admitting: Internal Medicine

## 2015-07-29 DIAGNOSIS — Z780 Asymptomatic menopausal state: Secondary | ICD-10-CM

## 2015-08-05 ENCOUNTER — Encounter: Payer: Self-pay | Admitting: Internal Medicine

## 2015-09-14 ENCOUNTER — Telehealth: Payer: Self-pay

## 2015-09-14 NOTE — Telephone Encounter (Signed)
Patient is refusing to be seen by ED

## 2015-09-14 NOTE — Telephone Encounter (Signed)
Pt called and stated that there is bilateral leg swelling. Pt states that the right leg at ankle is worse. Location that is swollen is warm to the touch and red. Patient wanted to wait for Dr. Sharlet Salina to return but I advised that it may be more than just fluid given that it is warm to the touch.

## 2015-09-14 NOTE — Telephone Encounter (Signed)
She needs to make an appt with one of the providers. Or go to ED.

## 2015-09-15 ENCOUNTER — Other Ambulatory Visit (INDEPENDENT_AMBULATORY_CARE_PROVIDER_SITE_OTHER): Payer: Medicare Other

## 2015-09-15 ENCOUNTER — Ambulatory Visit (INDEPENDENT_AMBULATORY_CARE_PROVIDER_SITE_OTHER): Payer: Medicare Other | Admitting: Family

## 2015-09-15 ENCOUNTER — Encounter: Payer: Self-pay | Admitting: Family

## 2015-09-15 VITALS — BP 130/70 | HR 46 | Temp 98.4°F | Ht 62.0 in | Wt 196.2 lb

## 2015-09-15 DIAGNOSIS — L03115 Cellulitis of right lower limb: Secondary | ICD-10-CM | POA: Diagnosis not present

## 2015-09-15 DIAGNOSIS — R079 Chest pain, unspecified: Secondary | ICD-10-CM

## 2015-09-15 DIAGNOSIS — R600 Localized edema: Secondary | ICD-10-CM

## 2015-09-15 LAB — TROPONIN I: TNIDX: 0.01 ug/L (ref 0.00–0.06)

## 2015-09-15 MED ORDER — CEPHALEXIN 500 MG PO CAPS: 500.0000 mg | ORAL_CAPSULE | Freq: Two times a day (BID) | ORAL | Status: DC

## 2015-09-15 NOTE — Progress Notes (Signed)
Pre visit review using our clinic review tool, if applicable. No additional management support is needed unless otherwise documented below in the visit note. 

## 2015-09-15 NOTE — Progress Notes (Signed)
Subjective:    Patient ID: Kim Fuller, female    DOB: [birth year], 70 y.o.   MRN: [id number]   Kim Fuller is a 70 y.o. female who presents today for an acute visit.    HPI Comments: Patient is here for evaluation of bilateral lower leg swelling off and on for a couple of months. Improves with elevation and after sleep. Worse after standing for long periods of time, or when legs dangle. Right leg is slightly worse than left, has area of redness on front near ankle. Describes right leg is being warm to the touch, tenderness.Toes stay cold and has varicose veins on both legs. HTN, takes ACE and BB-HCTZ. No h/o DVT.  No DM.   Also describes an instance of left arm numbness and tingling for 3 hours while lying on her leftside. Resolved on its own. Has never had chest pain a/w activity.  Denies exertional chest pain or pressure, palpitations, dizziness, frequent headaches, changes in vision, or shortness of breath. No MI. Had a normal stress test per patient 2007.         Past Medical History  Diagnosis Date  . Reactive airway disease   . Hypertension   . GERD (gastroesophageal reflux disease)   . Diverticulosis   . Osteopenia     -1.5 @ femoral neck 11/2007  . Abnormal finding on Pap smear     x1  . Hyperlipidemia    Allergies: Sulfonamide derivatives and Tramadol hcl Current Outpatient Prescriptions on File Prior to Visit  Medication Sig Dispense Refill  . aspirin 81 MG chewable tablet Chew 81 mg by mouth daily.      . benazepril (LOTENSIN) 40 MG tablet TAKE AS DIRECTED 1/2 TO 1 TABLET BY MOUTH EVERY DAY TO MAINTAIN BLOOD PRESSURE AVERAGE OF 130/85. 90 tablet 3  . bisoprolol-hydrochlorothiazide (ZIAC) 5-6.25 MG tablet TAKE 1 TABLET BY MOUTH ONCE DAILY. 90 tablet 3  . Calcium Carbonate (CALTRATE 600 PO) Take 600 mg by mouth 2 (two) times daily.      . cholecalciferol (VITAMIN D) 1000 UNITS tablet Take 1,000 Units by mouth daily.      . fluticasone (FLONASE) 50 MCG/ACT nasal  spray Place 2 sprays into both nostrils daily. 16 g 6  . Multiple Vitamin (MULTIVITAMIN PO) Take by mouth daily.      . Omega-3 Fatty Acids (FISH OIL) 1000 MG CAPS Take 1,000 mg by mouth 2 (two) times daily.      Marland Kitchen omeprazole (PRILOSEC) 20 MG capsule Take 20 mg by mouth daily.      . pravastatin (PRAVACHOL) 20 MG tablet Take 1 tablet (20 mg total) by mouth daily. 90 tablet 3  . vitamin E 400 UNIT capsule Take 400 Units by mouth daily.      . cyclobenzaprine (FLEXERIL) 5 MG tablet Take 1 tablet (5 mg total) by mouth 3 (three) times daily as needed (pain). (Patient not taking: Reported on 09/15/2015) 60 tablet 1   No current facility-administered medications on file prior to visit.    Social History  Substance Use Topics  . Smoking status: Former Smoker    Quit date: 05/07/1970  . Smokeless tobacco: None     Comment: Smoked as a teen  1962-1972,only up to 3 cigarettes/ day  . Alcohol Use: No    Review of Systems  Constitutional: Negative for fever and chills.  Respiratory: Negative for cough and shortness of breath.   Cardiovascular: Positive for leg swelling. Negative for chest pain and  palpitations.  Gastrointestinal: Negative for nausea and vomiting.  Neurological: Negative for dizziness and headaches.      Objective:    BP 130/70 mmHg  Pulse 46  Temp(Src) 98.4 F (36.9 C) (Oral)  Ht 5\' 2"  (1.575 m)  Wt 196 lb 4 oz (89.018 kg)  BMI 35.89 kg/m2  SpO2 95%   Physical Exam  Constitutional: She appears well-developed and well-nourished.  Eyes: Conjunctivae are normal.  Cardiovascular: Normal rate, regular rhythm, normal heart sounds and normal pulses.   Nonpitting LE edema. No palpable cords or masses. Area of erythema and increased warmth noted right anterior leg proximal to ankle. Negative Homan sign bilaterally.  LE hair growth symmetric and present. No discoloration. Varicosities noted BLE BUE. LE warm and palpable pedal pulses.   Pulmonary/Chest: Effort normal and  breath sounds normal. She has no wheezes. She has no rhonchi. She has no rales.  Neurological: She is alert.  Sensation intact BLE.  Skin: Skin is warm and dry.  Psychiatric: She has a normal mood and affect. Her speech is normal and behavior is normal. Thought content normal.  Vitals reviewed.      Assessment & Plan:  1. Bilateral leg edema Wells score 1.  - Compression stockings  2. Cellulitis of right lower extremity Concern increased warmth and erythema may be the beginning of cellulitis. - cephALEXin (KEFLEX) 500 MG capsule; Take 1 capsule (500 mg total) by mouth 2 (two) times daily.  Dispense: 14 capsule; Refill: 0  3. Chest pain, unspecified chest pain type  -EKG shows sinus brady. No acute ischemia. Compared today's EKG to prior in 2013 and 2015, patient also had sinus bradycardia. Today is actually better at 44. Reassured the patient is asymptomatic and I suspect that numbness and tingling in left arm was because she was laying on her left side.  - Troponin I; Pending    I am having Kim Fuller maintain her aspirin, Calcium Carbonate (CALTRATE 600 PO), Fish Oil, Multiple Vitamin (MULTIVITAMIN PO), vitamin E, cholecalciferol, omeprazole, fluticasone, benazepril, bisoprolol-hydrochlorothiazide, pravastatin, and cyclobenzaprine.   No orders of the defined types were placed in this encounter.     Start medications as prescribed and explained to patient on After Visit Summary ( AVS). Risks, benefits, and alternatives of the medications and treatment plan prescribed today were discussed, and patient expressed understanding.   Education regarding symptom management and diagnosis given to patient.   Follow-up:Plan follow-up as discussed or as needed if any worsening symptoms or change in condition.   Continue to follow with Hoyt Koch, MD for routine health maintenance.   Kim Fuller and I agreed with plan.   Mable Paris, FNP

## 2015-09-15 NOTE — Patient Instructions (Addendum)
Trial of compression stocking and MORE elevation of legs to see if improve.   Will cover with antibiotic.   Concerned by left arm numbness, tingling-- may be how you were lying or more worrisome like heart attack. Info on heart attack below. Go to lab on your way out. EKG shows low heart rate. If ever you experience dizziness, lightheadedness, please be sure to come in for evaluation.  If there is no improvement in your symptoms, or if there is any worsening of symptoms, or if you have any additional concerns, please return for re-evaluation; or, if we are closed, consider going to the Emergency Room for evaluation if symptoms urgent.  =-Heart Attack A heart attack (myocardial infarction, MI) causes damage to the heart that cannot be fixed. A heart attack often happens when a blood clot or other blockage cuts blood flow to the heart. When this happens, certain areas of the heart begin to die. This causes the pain you feel during a heart attack. HOME CARE  Take medicine as told by your doctor. You may need medicine to:  Keep your blood from clotting too easily.  Control your blood pressure.  Lower your cholesterol.  Control abnormal heart rhythms.  Change certain behaviors as told by your doctor. This may include:  Quitting smoking.  Being active.  Eating a heart-healthy diet. Ask your doctor for help with this diet.  Keeping a healthy weight.  Keeping your diabetes under control.  Lessening stress.  Limiting how much alcohol you drink. Do not take these medicines unless your doctor says that you can:  Nonsteroidal anti-inflammatory drugs (NSAIDs). These include:  Ibuprofen.  Naproxen.  Celecoxib.  Vitamin supplements that have vitamin A, vitamin E, or both.  Hormone therapy that contains estrogen with or without progestin. GET HELP RIGHT AWAY IF:  You have sudden chest discomfort.  You have sudden discomfort in your:  Arms.  Back.  Neck.  Jaw.  You have  shortness of breath at any time.  You have sudden sweating or clammy skin.  You feel sick to your stomach (nauseous) or throw up (vomit).  You suddenly get light-headed or dizzy.  You feel your heart beating fast or skipping beats. These symptoms may be an emergency. Do not wait to see if the symptoms will go away. Get medical help right away. Call your local emergency services (911 in the [place] Do not drive yourself to the hospital.   This information is not intended to replace advice given to you by your health care provider. Make sure you discuss any questions you have with your health care provider.   Document Released: 10/23/2011 Document Revised: 09/07/2014 Document Reviewed: 06/26/2013 Elsevier Interactive Patient Education [employer].

## 2015-10-28 ENCOUNTER — Ambulatory Visit (INDEPENDENT_AMBULATORY_CARE_PROVIDER_SITE_OTHER): Payer: Medicare Other | Admitting: Internal Medicine

## 2015-10-28 ENCOUNTER — Encounter: Payer: Self-pay | Admitting: Internal Medicine

## 2015-10-28 VITALS — BP 150/72 | HR 50 | Temp 98.6°F | Resp 12 | Ht 62.0 in | Wt 188.1 lb

## 2015-10-28 DIAGNOSIS — M25562 Pain in left knee: Secondary | ICD-10-CM

## 2015-10-28 MED ORDER — METHYLPREDNISOLONE ACETATE 40 MG/ML IJ SUSP
40.0000 mg | Freq: Once | INTRAMUSCULAR | Status: AC
  Administered 2015-10-28: 40 mg via INTRAMUSCULAR

## 2015-10-28 NOTE — Assessment & Plan Note (Signed)
Referral to sports medicine for injections. Possibly pre-patellar bursitis. Given depo-medrol 40 mg IM at visit.

## 2015-10-28 NOTE — Progress Notes (Signed)
Pre visit review using our clinic review tool, if applicable. No additional management support is needed unless otherwise documented below in the visit note. 

## 2015-10-28 NOTE — Progress Notes (Signed)
   Subjective:    Patient ID: Kim Fuller, female    DOB: 08/24/1945, 70 y.o.   MRN: [id number]  HPI The patient is a 70 YO female coming in for left knee pain. Started about 4 months ago after fall. Still present and not changed. She has tried ibuprofen, flexeril, tylenol without relief. Does not collapse on her. She has a lot of varicose and sclerosed veins in the area of her knee and it is swollen some. No re-injury in the last 3 months. No numbness or weakness.   Review of Systems  Constitutional: Negative for fever, chills, activity change, appetite change, fatigue and unexpected weight change.  Respiratory: Negative for cough, chest tightness, shortness of breath and wheezing.   Cardiovascular: Negative for chest pain, palpitations and leg swelling.  Gastrointestinal: Negative.   Musculoskeletal: Positive for myalgias and arthralgias. Negative for back pain and gait problem.  Skin: Negative.   Neurological: Negative.   Psychiatric/Behavioral: Negative.       Objective:   Physical Exam  Constitutional: She is oriented to person, place, and time. She appears well-developed and well-nourished.  HENT:  Head: Normocephalic and atraumatic.  Right Ear: External ear normal.  Left Ear: External ear normal.  Eyes: EOM are normal.  Neck: Normal range of motion. No JVD present. No tracheal deviation present. No thyromegaly present.  Cardiovascular: Normal rate and regular rhythm.   Pulmonary/Chest: Effort normal and breath sounds normal. No respiratory distress. She has no wheezes. She has no rales.  Abdominal: Soft.  Musculoskeletal: She exhibits no edema.  Tenderness in the knee, tenderness in the pre-patellar bursa, ACL and PCL intact left knee. No joint swelling. Sclerosed and varicose veins in the area.   Lymphadenopathy:    She has no cervical adenopathy.  Neurological: She is alert and oriented to person, place, and time. Coordination normal.  Skin: Skin is warm and dry.    Filed Vitals:   10/28/15 0947  BP: 150/72  Pulse: 50  Temp: 98.6 F (37 C)  TempSrc: Oral  Resp: 12  Height: 5\' 2"  (1.575 m)  Weight: 188 lb 1.9 oz (85.331 kg)  SpO2: 96%      Assessment & Plan:  Depo-medrol 40 mg IM given at visit

## 2015-10-28 NOTE — Addendum Note (Signed)
Addended by: Resa Miner R on: 10/28/2015 03:17 PM   Modules accepted: Orders

## 2015-10-28 NOTE — Patient Instructions (Signed)
We have given you the steroid shot today that will help with the pain.  We will also have you schedule with Dr. Tamala Julian of sports medicine in our office to get a shot in your knee.

## 2015-11-18 ENCOUNTER — Ambulatory Visit (INDEPENDENT_AMBULATORY_CARE_PROVIDER_SITE_OTHER): Payer: Medicare Other | Admitting: Family Medicine

## 2015-11-18 ENCOUNTER — Other Ambulatory Visit: Payer: Self-pay

## 2015-11-18 ENCOUNTER — Encounter: Payer: Self-pay | Admitting: Family Medicine

## 2015-11-18 VITALS — BP 126/72 | HR 56 | Ht 61.75 in | Wt 186.0 lb

## 2015-11-18 DIAGNOSIS — M1712 Unilateral primary osteoarthritis, left knee: Secondary | ICD-10-CM | POA: Diagnosis not present

## 2015-11-18 DIAGNOSIS — M25562 Pain in left knee: Secondary | ICD-10-CM

## 2015-11-18 DIAGNOSIS — M179 Osteoarthritis of knee, unspecified: Secondary | ICD-10-CM | POA: Insufficient documentation

## 2015-11-18 DIAGNOSIS — M1710 Unilateral primary osteoarthritis, unspecified knee: Secondary | ICD-10-CM | POA: Insufficient documentation

## 2015-11-18 NOTE — Progress Notes (Addendum)
[name] [place], [place] [address] Phone: [phone] Subjective:    I'm seeing this patient by the request  of:  Hoyt Koch, MD   CC: Left knee pain  [id number] Kim Fuller is a 70 y.o. female coming in with complaint of left knee pain. Patient states that she has had this pain for multiple months. Has been walking more with her husband. States that going up and down hill seems to be worse. Denies any true injury. Rates the severity of pain a 5 out of 10. Tries to avoid over-the-counter medications. To try icing which was helpful. No significant instability but sometimes feels like it fatigues quickly.   patient did have x-rays of the left knee February 2017. This was independently visualized by me and showed moderate medial compartment arthritis.  Past Medical History  Diagnosis Date  . Reactive airway disease   . Hypertension   . GERD (gastroesophageal reflux disease)   . Diverticulosis   . Osteopenia     -1.5 @ femoral neck 11/2007  . Abnormal finding on Pap smear     x1  . Hyperlipidemia    Past Surgical History  Procedure Laterality Date  . Tubal ligation    . Hand surgery      Trigger thumb  . Carpal tunnel release  2009     bilaterally  . Cardiovascular stress test  03/05/06  . Colonoscopy w/ polypectomy  1998    Tics @ 2004 & 2012; Dr Carlean Purl  . Cataract extraction, bilateral      Dr Gershon Crane   Social History   Social History  . Marital Status: Married    Spouse Name: N/A  . Number of Children: N/A  . Years of Education: N/A   Occupational History  . Systems analyst    Social History Main Topics  . Smoking status: Former Smoker    Quit date: 05/07/1970  . Smokeless tobacco: None     Comment: Smoked as a teen  1962-1972,only up to 3 cigarettes/ day  . Alcohol Use: No  . Drug Use: No  . Sexual Activity: Not Asked   Other Topics Concern  . None   Social History Narrative   Allergies    Allergen Reactions  . Sulfonamide Derivatives     Rash Because of a history of documented adverse serious drug reaction;Medi Alert bracelet  is recommended  . Tramadol Hcl     REACTION: cold sweats, weak, fatigue   Family History  Problem Relation Age of Onset  . Asthma Sister   . Hypertension Sister   . Hypertension Father   . Heart attack Father     MI > 53  . Prostate cancer Father   . Lung cancer Sister     smoker  . Melanoma Sister   . Osteoporosis Sister   . Glaucoma Sister     X 2  . Diverticulosis Sister     2 sisters  . Lung cancer Sister   . Colon polyps Sister     2 sisters with polyps  . Diverticulitis Sister     1 sister  . Hypertension Mother   . Hypertension Brother   . Skin cancer Sister   . Diabetes Neg Hx   . Stroke Neg Hx     Past medical history, social, surgical and family history all reviewed in electronic medical record.  No pertanent information unless stated regarding to the chief complaint.   Review  of Systems: No headache, visual changes, nausea, vomiting, diarrhea, constipation, dizziness, abdominal pain, skin rash, fevers, chills, night sweats, weight loss, swollen lymph nodes, body aches, joint swelling, muscle aches, chest pain, shortness of breath, mood changes.   Objective Blood pressure 126/72, height 5' 1.75" (1.568 m), weight 186 lb (84.369 kg).  General: No apparent distress alert and oriented x3 mood and affect normal, dressed appropriately.  HEENT: Pupils equal, extraocular movements intact  Respiratory: Patient's speak in full sentences and does not appear short of breath  Cardiovascular: No lower extremity edema, non tender, no erythema  Skin: Warm dry intact with no signs of infection or rash on extremities or on axial skeleton.  Abdomen: Soft nontender  Neuro: Cranial nerves II through XII are intact, neurovascularly intact in all extremities with 2+ DTRs and 2+ pulses.  Lymph: No lymphadenopathy of posterior or anterior  cervical chain or axillae bilaterally.  Gait normal with good balance and coordination.  MSK:  Non tender with full range of motion and good stability and symmetric strength and tone of shoulders, elbows, wrist, hip, and ankles bilaterally.  Knee: Left Valgus deformity of the left knee significant varicose veins on the left leg compared to the right side. Tender to palpation over the medial joint line ROM full in flexion and extension and lower leg rotation. Does have significant crepitus Mild instability with valgus force. Patient also has significant thigh to calf ratio Negative Mcmurray's, Apley's, and Thessalonian tests. painful patellar compression. Patellar glide with mild crepitus. Patellar and quadriceps tendons unremarkable. Hamstring and quadriceps strength is normal.  Contralateral knee has valgus deformity but nontender  MSK US performed of: Left knee This study was ordered, performed, and interpreted by Charlann Boxer D.O.  Knee: All structures visualized. Degenerative changes with moderate to severe narrowing of the medial joint line tearing of the patellofemoral joint and small suprapatellar effusion Patellar Tendon unremarkable on long and transverse views without effusion. No abnormality of prepatellar bursa. LCL and MCL unremarkable on long and transverse views. No abnormality of origin of medial or lateral head of the gastrocnemius.  IMPRESSION:  Moderate to severe medial compartment arthritis.    After informed written and verbal consent, patient was seated on exam table. Left knee was prepped with alcohol swab and utilizing anterolateral approach, patient's left knee space was injected with 4:1  marcaine 0.5%: Kenalog 40mg /dL. Patient tolerated the procedure well without immediate complications. Impression and Recommendations:     This case required medical decision making of moderate complexity.      Note: This dictation was prepared with Dragon dictation along  with smaller phrase technology. Any transcriptional errors that result from this process are unintentional.

## 2015-11-18 NOTE — Patient Instructions (Addendum)
Good to see you  Ice 20 minutes 2 times daily. Usually after activity and before bed. You do have arthritis of the knee.  pennsaid pinkie amount topically 2 times daily as needed.  Stay active but consider biking, elliptical or swimming.  OK to walk but try softer surfaces Injected the knee today  Good shoes with rigid bottom.  Jalene Mullet, [place]rell or New balance greater then 700 Vitamin D 2000 IU daily  Turmeric 500mg  daily  Tart cherry extract at night.  See me again in 4 weeks and if not better we will discuss bracing and other injections.

## 2015-11-18 NOTE — Assessment & Plan Note (Signed)
Patient given injection and tolerated the procedure well. We discussed icing regimen and home exercises. We discussed which activities to do in which ones to potentially avoid. We discussed over-the-counter medications. Given trial of topical anti-inflammatory's. Patient will follow-up with me again in 4 weeks. She would be a candidate for a stability brace as well as viscous supplementation if necessary.

## 2015-12-15 NOTE — Progress Notes (Signed)
[name] [place], [place] [address] Phone: [phone] Subjective:    I'm seeing this patient by the request  of:  Hoyt Koch, MD   CC: Left knee pain follow-up  [id number]  Kim Fuller is a 70 y.o. female coming in with complaint of left knee pain. Seen one month ago and was given an injection in the left knee for moderate to severe degenerative changes of the knee. Patient was to try conservative therapy including icing regimen, home exercises, different shoes as well as topical anti-inflammatories. Patient states She is doing 99% better. Patient is very happy at this time. Patient does doing very well. Not having any significant trouble.   \patient is artery done physical therapy previously.  patient did have x-rays of the left knee February 2017. This was independently visualized by me and showed moderate medial compartment arthritis.patient's imaging on ultrasound showed further degenerative changes.  Past Medical History:  Diagnosis Date  . Abnormal finding on Pap smear    x1  . Diverticulosis   . GERD (gastroesophageal reflux disease)   . Hyperlipidemia   . Hypertension   . Osteopenia    -1.5 @ femoral neck 11/2007  . Reactive airway disease    Past Surgical History:  Procedure Laterality Date  . CARDIOVASCULAR STRESS TEST  03/05/06  . CARPAL TUNNEL RELEASE  2009    bilaterally  . CATARACT EXTRACTION, BILATERAL     Dr Gershon Crane  . COLONOSCOPY W/ POLYPECTOMY  1998   Tics @ 2004 & 2012; Dr Carlean Purl  . HAND SURGERY     Trigger thumb  . TUBAL LIGATION     Social History   Social History  . Marital status: Married    Spouse name: N/A  . Number of children: N/A  . Years of education: N/A   Occupational History  . Systems analyst    Social History Main Topics  . Smoking status: Former Smoker    Quit date: 05/07/1970  . Smokeless tobacco: Not on file     Comment: Smoked as a teen  1962-1972,only up to  3 cigarettes/ day  . Alcohol use No  . Drug use: No  . Sexual activity: Not on file   Other Topics Concern  . Not on file   Social History Narrative  . No narrative on file   Allergies  Allergen Reactions  . Sulfonamide Derivatives     Rash Because of a history of documented adverse serious drug reaction;Medi Alert bracelet  is recommended  . Tramadol Hcl     REACTION: cold sweats, weak, fatigue   Family History  Problem Relation Age of Onset  . Asthma Sister   . Hypertension Sister   . Hypertension Father   . Heart attack Father     MI > 58  . Prostate cancer Father   . Lung cancer Sister     smoker  . Melanoma Sister   . Osteoporosis Sister   . Glaucoma Sister     X 2  . Diverticulosis Sister     2 sisters  . Lung cancer Sister   . Colon polyps Sister     2 sisters with polyps  . Diverticulitis Sister     1 sister  . Hypertension Mother   . Hypertension Brother   . Skin cancer Sister   . Diabetes Neg Hx   . Stroke Neg Hx     Past medical history, social, surgical and family  history all reviewed in electronic medical record.  No pertanent information unless stated regarding to the chief complaint.   Review of Systems: No headache, visual changes, nausea, vomiting, diarrhea, constipation, dizziness, abdominal pain, skin rash, fevers, chills, night sweats, weight loss, swollen lymph nodes, body aches, joint swelling, muscle aches, chest pain, shortness of breath, mood changes.   Objective  Blood pressure (!) 146/92, pulse (!) 49, resp. rate 16, weight 185 lb (83.9 kg), SpO2 96 %.  General: No apparent distress alert and oriented x3 mood and affect normal, dressed appropriately.  HEENT: Pupils equal, extraocular movements intact  Respiratory: Patient's speak in full sentences and does not appear short of breath  Cardiovascular: No lower extremity edema, non tender, no erythema  Skin: Warm dry intact with no signs of infection or rash on extremities or on axial  skeleton.  Abdomen: Soft nontender  Neuro: Cranial nerves II through XII are intact, neurovascularly intact in all extremities with 2+ DTRs and 2+ pulses.  Lymph: No lymphadenopathy of posterior or anterior cervical chain or axillae bilaterally.  Gait normal with good balance and coordination.  MSK:  Non tender with full range of motion and good stability and symmetric strength and tone of shoulders, elbows, wrist, hip, and ankles bilaterally.  Knee: Left Valgus deformity of the left knee significant varicose veins on the left leg compared to the right side. Minimal tenderness to palpation over the medial joint line ROM full in flexion and extension and lower leg rotation. Does have significant crepitus Mild instability with valgus force still remaining. Patient also has significant thigh to calf ratio Negative Mcmurray's, Apley's, and Thessalonian tests. painful patellar compression. Patellar glide with mild crepitus. Patellar and quadriceps tendons unremarkable. Hamstring and quadriceps strength is normal.  Contralateral knee has valgus deformity but nontender Mild improvement from previous gram     Impression and Recommendations:     This case required medical decision making of moderate complexity.      Note: This dictation was prepared with Dragon dictation along with smaller phrase technology. Any transcriptional errors that result from this process are unintentional.

## 2015-12-16 ENCOUNTER — Encounter: Payer: Self-pay | Admitting: Family Medicine

## 2015-12-16 ENCOUNTER — Ambulatory Visit (INDEPENDENT_AMBULATORY_CARE_PROVIDER_SITE_OTHER): Payer: Medicare Other | Admitting: Family Medicine

## 2015-12-16 DIAGNOSIS — M1712 Unilateral primary osteoarthritis, left knee: Secondary | ICD-10-CM

## 2015-12-16 NOTE — Progress Notes (Signed)
Pre visit review using our clinic review tool, if applicable. No additional management support is needed unless otherwise documented below in the visit note. 

## 2015-12-16 NOTE — Assessment & Plan Note (Signed)
Doing very well this time. Patient at this time is doing well with conservative therapy. No significant change her management. We discussed we can repeat injection every 3 months if necessary. We discussed that she could be a candidate for viscous supplementation. Patient Is Well She Can Follow-Up As Needed. Encourage Her to Continue the Exercises in the Good Shoes.

## 2015-12-16 NOTE — Patient Instructions (Addendum)
Good to see you  Ice is your friend Stay active Good luck with the shoes See me again in 2 months IF you need me

## 2016-02-04 DIAGNOSIS — Z23 Encounter for immunization: Secondary | ICD-10-CM | POA: Diagnosis not present

## 2016-02-07 ENCOUNTER — Ambulatory Visit (INDEPENDENT_AMBULATORY_CARE_PROVIDER_SITE_OTHER): Payer: Medicare Other | Admitting: Family Medicine

## 2016-02-07 ENCOUNTER — Encounter: Payer: Self-pay | Admitting: Family Medicine

## 2016-02-07 DIAGNOSIS — M1712 Unilateral primary osteoarthritis, left knee: Secondary | ICD-10-CM

## 2016-02-07 NOTE — Progress Notes (Signed)
[name] [place], [place] [address] Phone: [phone] Subjective:    I'm seeing this patient by the request  of:  Hoyt Koch, MD   CC: Left knee pain follow-up  [id number]  Kim Fuller is a 70 y.o. female coming in with complaint of left knee pain. Seen one month ago and was given an injection in the left knee for moderate to severe degenerative changes of the knee. . Patient given injection 10 weeks ago. States that she was doing very well until the last 2 weeks when the pain started increase when she started increasing her walking. Patient states that she has had some swelling. Some increasing instability. Patient states that just seems to be worsening symptoms then she has had recently.   Previous imaging:  patient did have x-rays of the left knee February 2017. This was independently visualized by me and showed moderate medial compartment arthritis.patient's imaging on ultrasound showed further degenerative changes.  Past Medical History:  Diagnosis Date  . Abnormal finding on Pap smear    x1  . Diverticulosis   . GERD (gastroesophageal reflux disease)   . Hyperlipidemia   . Hypertension   . Osteopenia    -1.5 @ femoral neck 11/2007  . Reactive airway disease    Past Surgical History:  Procedure Laterality Date  . CARDIOVASCULAR STRESS TEST  03/05/06  . CARPAL TUNNEL RELEASE  2009    bilaterally  . CATARACT EXTRACTION, BILATERAL     Dr Gershon Crane  . COLONOSCOPY W/ POLYPECTOMY  1998   Tics @ 2004 & 2012; Dr Carlean Purl  . HAND SURGERY     Trigger thumb  . TUBAL LIGATION     Social History   Social History  . Marital status: Married    Spouse name: N/A  . Number of children: N/A  . Years of education: N/A   Occupational History  . Systems analyst    Social History Main Topics  . Smoking status: Former Smoker    Quit date: 05/07/1970  . Smokeless tobacco: None     Comment: Smoked as a teen   1962-1972,only up to 3 cigarettes/ day  . Alcohol use No  . Drug use: No  . Sexual activity: Not Asked   Other Topics Concern  . None   Social History Narrative  . None   Allergies  Allergen Reactions  . Sulfonamide Derivatives     Rash Because of a history of documented adverse serious drug reaction;Medi Alert bracelet  is recommended  . Tramadol Hcl     REACTION: cold sweats, weak, fatigue   Family History  Problem Relation Age of Onset  . Asthma Sister   . Hypertension Sister   . Hypertension Father   . Heart attack Father     MI > 94  . Prostate cancer Father   . Lung cancer Sister     smoker  . Melanoma Sister   . Osteoporosis Sister   . Glaucoma Sister     X 2  . Diverticulosis Sister     2 sisters  . Lung cancer Sister   . Colon polyps Sister     2 sisters with polyps  . Diverticulitis Sister     1 sister  . Hypertension Mother   . Hypertension Brother   . Skin cancer Sister   . Diabetes Neg Hx   . Stroke Neg Hx     Past medical history, social, surgical and family  history all reviewed in electronic medical record.  No pertanent information unless stated regarding to the chief complaint.   Review of Systems: No headache, visual changes, nausea, vomiting, diarrhea, constipation, dizziness, abdominal pain, skin rash, fevers, chills, night sweats, weight loss, swollen lymph nodes, body aches, joint swelling, muscle aches, chest pain, shortness of breath, mood changes.   Objective  Blood pressure 112/82, pulse (!) 52, weight 188 lb (85.3 kg), SpO2 96 %.  General: No apparent distress alert and oriented x3 mood and affect normal, dressed appropriately.  HEENT: Pupils equal, extraocular movements intact  Respiratory: Patient's speak in full sentences and does not appear short of breath  Cardiovascular: No lower extremity edema, non tender, no erythema  Skin: Warm dry intact with no signs of infection or rash on extremities or on axial skeleton.  Abdomen:  Soft nontender  Neuro: Cranial nerves II through XII are intact, neurovascularly intact in all extremities with 2+ DTRs and 2+ pulses.  Lymph: No lymphadenopathy of posterior or anterior cervical chain or axillae bilaterally.  Gait normal with good balance and coordination.  MSK:  Non tender with full range of motion and good stability and symmetric strength and tone of shoulders, elbows, wrist, hip, and ankles bilaterally.  Knee: Left Valgus deformity of the left knee significant varicose veins on the left leg compared to the right side. Significant thigh to calf ratio Severe tenderness to palpation over the medial joint line Lacks last 5 of extension Does have significant crepitus Mild instability with valgus force still remaining. Patient also has significant thigh to calf ratio Negative Mcmurray's, Apley's, and Thessalonian tests. painful patellar compression. Patellar glide with moderate crepitus. Patellar and quadriceps tendons unremarkable. Hamstring and quadriceps strength is normal.  Contralateral knee has valgus deformity but nontender  After informed written and verbal consent, patient was seated on exam table. Left knee was prepped with alcohol swab and utilizing anterolateral approach, patient's left knee space was injected with 4:1  marcaine 0.5%: Kenalog 40mg /dL. Patient tolerated the procedure well without immediate complications.    Impression and Recommendations:     This case required medical decision making of moderate complexity.      Note: This dictation was prepared with Dragon dictation along with smaller phrase technology. Any transcriptional errors that result from this process are unintentional.

## 2016-02-07 NOTE — Assessment & Plan Note (Signed)
Patient given repeat injection. Patient will continue with conservative therapy. Worsening symptoms she would be a candidate for viscous supplementation. We discussed repeating formal physical therapy which patient declined. We discussed proper shoes, we discussed more low impact exercises. Patient will follow-up with me again in 4-6 weeks.

## 2016-02-07 NOTE — Patient Instructions (Signed)
Good to see yo again  pennsaid pinkie amount topically 2 times daily as needed.  Ice 20 minutes 2 times daily. Usually after activity and before bed. Stay active but consider  Elliptical, biking or swimming for lower impact on the knees.  See me again in 6 weeks if yo are in pain and we will start the other injections.

## 2016-02-15 ENCOUNTER — Other Ambulatory Visit: Payer: Self-pay | Admitting: Obstetrics

## 2016-02-15 DIAGNOSIS — Z1231 Encounter for screening mammogram for malignant neoplasm of breast: Secondary | ICD-10-CM

## 2016-02-16 ENCOUNTER — Ambulatory Visit: Payer: Medicare Other | Admitting: Family Medicine

## 2016-02-17 ENCOUNTER — Ambulatory Visit: Payer: Medicare Other | Admitting: Family Medicine

## 2016-02-23 ENCOUNTER — Encounter: Payer: Medicare Other | Admitting: Internal Medicine

## 2016-03-07 NOTE — Progress Notes (Signed)
[facility] [place], [place] [id number] Phone: [phone] Subjective:    I'm seeing this patient by the request  of:  Hoyt Koch, MD   CC: Left knee pain follow-up  [id number]  Kim Fuller is a 70 y.o. female coming in with complaint of left knee pain. Seen one month ago and was given an injection in the left knee for moderate to severe degenerative changes of the knee. . Patient given injection 4 weeks ago. Patient states No significant improvement. Continues to have instability. Using a cane on a more regular basis. Second affect daily activities on a more regular basis.   Previous imaging:  patient did have x-rays of the left knee February 2017. This was independently visualized by me and showed moderate medial compartment arthritis.patient's imaging on ultrasound showed further degenerative changes.  Past Medical History:  Diagnosis Date  . Abnormal finding on Pap smear    x1  . Diverticulosis   . GERD (gastroesophageal reflux disease)   . Hyperlipidemia   . Hypertension   . Osteopenia    -1.5 @ femoral neck 11/2007  . Reactive airway disease    Past Surgical History:  Procedure Laterality Date  . CARDIOVASCULAR STRESS TEST  03/05/06  . CARPAL TUNNEL RELEASE  2009    bilaterally  . CATARACT EXTRACTION, BILATERAL     Dr Gershon Crane  . COLONOSCOPY W/ POLYPECTOMY  1998   Tics @ 2004 & 2012; Dr Carlean Purl  . HAND SURGERY     Trigger thumb  . TUBAL LIGATION     Social History   Social History  . Marital status: Married    Spouse name: N/A  . Number of children: N/A  . Years of education: N/A   Occupational History  . Systems analyst    Social History Main Topics  . Smoking status: Former Smoker    Quit date: 05/07/1970  . Smokeless tobacco: None     Comment: Smoked as a teen  1962-1972,only up to 3 cigarettes/ day  . Alcohol use No  . Drug use: No  . Sexual activity: Not Asked   Other Topics Concern  .  None   Social History Narrative  . None   Allergies  Allergen Reactions  . Sulfonamide Derivatives     Rash Because of a history of documented adverse serious drug reaction;Medi Alert bracelet  is recommended  . Tramadol Hcl     REACTION: cold sweats, weak, fatigue   Family History  Problem Relation Age of Onset  . Asthma Sister   . Hypertension Sister   . Hypertension Father   . Heart attack Father     MI > 3  . Prostate cancer Father   . Lung cancer Sister     smoker  . Melanoma Sister   . Osteoporosis Sister   . Glaucoma Sister     X 2  . Diverticulosis Sister     2 sisters  . Lung cancer Sister   . Colon polyps Sister     2 sisters with polyps  . Diverticulitis Sister     1 sister  . Hypertension Mother   . Hypertension Brother   . Skin cancer Sister   . Diabetes Neg Hx   . Stroke Neg Hx     Past medical history, social, surgical and family history all reviewed in electronic medical record.  No pertanent information unless stated regarding to the chief complaint.   Review of  Systems: No headache, visual changes, nausea, vomiting, diarrhea, constipation, dizziness, abdominal pain, skin rash, fevers, chills, night sweats, weight loss, swollen lymph nodes, body aches, joint swelling, muscle aches, chest pain, shortness of breath, mood changes.   Objective  Blood pressure 118/70, pulse 61, height 5\' 2"  (1.575 m), weight 190 lb (86.2 kg), SpO2 95 %.  Systems examined below as of 03/08/16 General: NAD A&O x3 mood, affect normal  HEENT: Pupils equal, extraocular movements intact no nystagmus Respiratory: not short of breath at rest or with speaking Cardiovascular: No lower extremity edema, non tender Skin: Warm dry intact with no signs of infection or rash on extremities or on axial skeleton. Abdomen: Soft nontender, no masses Neuro: Cranial nerves  intact, neurovascularly intact in all extremities with 2+ DTRs and 2+ pulses. Lymph: No lymphadenopathy appreciated  today  Gait antalgic gait  MSK: Non tender with full range of motion and good stability and symmetric strength and tone of shoulders, elbows, wrist,  hips and ankles bilaterally.   Knee: Left Valgus deformity of the left knee significant varicose veins on the left leg compared to the right side. Significant thigh to calf ratio Severe tenderness to palpation over the medial joint line Lacks last 5 of extension Does have significant crepitus Mild instability with valgus force still remaining. Patient also has significant thigh to calf ratio Negative Mcmurray's, Apley's, and Thessalonian tests. painful patellar compression. Patellar glide with moderate crepitus. Patellar and quadriceps tendons unremarkable. Hamstring and quadriceps strength is normal.  Contralateral knee has valgus deformity but nontender  After informed written and verbal consent, patient was seated on exam table. Left knee was prepped with alcohol swab and utilizing anterolateral approach, patient's left knee space was injected with15 mg/2.5 mL of Orthovisc(sodium hyaluronate) in a prefilled syringe was injected easily into the knee through a 22-gauge needle..Patient tolerated the procedure well without immediate complications.     Impression and Recommendations:     This case required medical decision making of moderate complexity.      Note: This dictation was prepared with Dragon dictation along with smaller phrase technology. Any transcriptional errors that result from this process are unintentional.

## 2016-03-08 ENCOUNTER — Encounter: Payer: Self-pay | Admitting: Family Medicine

## 2016-03-08 ENCOUNTER — Other Ambulatory Visit (INDEPENDENT_AMBULATORY_CARE_PROVIDER_SITE_OTHER): Payer: Medicare Other

## 2016-03-08 ENCOUNTER — Ambulatory Visit (INDEPENDENT_AMBULATORY_CARE_PROVIDER_SITE_OTHER): Payer: Medicare Other | Admitting: Family Medicine

## 2016-03-08 ENCOUNTER — Encounter: Payer: Self-pay | Admitting: Internal Medicine

## 2016-03-08 ENCOUNTER — Ambulatory Visit (INDEPENDENT_AMBULATORY_CARE_PROVIDER_SITE_OTHER): Payer: Medicare Other | Admitting: Internal Medicine

## 2016-03-08 VITALS — BP 150/70 | HR 70 | Temp 98.2°F | Resp 16 | Ht 62.0 in | Wt 190.4 lb

## 2016-03-08 DIAGNOSIS — B9789 Other viral agents as the cause of diseases classified elsewhere: Secondary | ICD-10-CM | POA: Diagnosis not present

## 2016-03-08 DIAGNOSIS — I10 Essential (primary) hypertension: Secondary | ICD-10-CM | POA: Diagnosis not present

## 2016-03-08 DIAGNOSIS — M1712 Unilateral primary osteoarthritis, left knee: Secondary | ICD-10-CM | POA: Diagnosis not present

## 2016-03-08 DIAGNOSIS — Z1159 Encounter for screening for other viral diseases: Secondary | ICD-10-CM

## 2016-03-08 DIAGNOSIS — Z0001 Encounter for general adult medical examination with abnormal findings: Secondary | ICD-10-CM | POA: Diagnosis not present

## 2016-03-08 DIAGNOSIS — J069 Acute upper respiratory infection, unspecified: Secondary | ICD-10-CM | POA: Diagnosis not present

## 2016-03-08 DIAGNOSIS — E782 Mixed hyperlipidemia: Secondary | ICD-10-CM

## 2016-03-08 DIAGNOSIS — Z0000 Encounter for general adult medical examination without abnormal findings: Secondary | ICD-10-CM

## 2016-03-08 LAB — COMPREHENSIVE METABOLIC PANEL
ALBUMIN: 4.2 g/dL (ref 3.5–5.2)
ALT: 11 U/L (ref 0–35)
AST: 15 U/L (ref 0–37)
Alkaline Phosphatase: 59 U/L (ref 39–117)
BILIRUBIN TOTAL: 0.7 mg/dL (ref 0.2–1.2)
BUN: 14 mg/dL (ref 6–23)
CALCIUM: 9.9 mg/dL (ref 8.4–10.5)
CHLORIDE: 100 meq/L (ref 96–112)
CO2: 33 meq/L — AB (ref 19–32)
Creatinine, Ser: 0.62 mg/dL (ref 0.40–1.20)
GFR: 100.87 mL/min (ref >60.00)
Glucose, Bld: 91 mg/dL (ref 70–99)
Potassium: 3.8 mEq/L (ref 3.5–5.1)
Sodium: 139 mEq/L (ref 135–145)
Total Protein: 7.7 g/dL (ref 6.0–8.3)

## 2016-03-08 LAB — LIPID PANEL
CHOLESTEROL: 202 mg/dL — AB (ref 0–200)
HDL: 60.3 mg/dL (ref >39.00)
LDL CALC: 105 mg/dL — AB (ref 0–99)
NONHDL: 141.31
TRIGLYCERIDES: 181 mg/dL — AB (ref 0.0–149.0)
Total CHOL/HDL Ratio: 3
VLDL: 36.2 mg/dL (ref 0.0–40.0)

## 2016-03-08 MED ORDER — BENAZEPRIL HCL 40 MG PO TABS: ORAL_TABLET | ORAL | 3 refills | Status: DC

## 2016-03-08 MED ORDER — ACETAMINOPHEN-CODEINE 120-12 MG/5ML PO SOLN: 5.0000 mL | Freq: Three times a day (TID) | ORAL | 0 refills | Status: DC | PRN

## 2016-03-08 MED ORDER — PRAVASTATIN SODIUM 20 MG PO TABS: 20.0000 mg | ORAL_TABLET | Freq: Every day | ORAL | 3 refills | Status: DC

## 2016-03-08 MED ORDER — FLUTICASONE PROPIONATE 50 MCG/ACT NA SUSP: 2.0000 | Freq: Every day | NASAL | 6 refills | Status: DC

## 2016-03-08 MED ORDER — BISOPROLOL-HYDROCHLOROTHIAZIDE 5-6.25 MG PO TABS: ORAL_TABLET | ORAL | 3 refills | Status: DC

## 2016-03-08 NOTE — Patient Instructions (Signed)
Good to see you  Started on orthovisc today  I would still consider continuing the icing.  Keep up with the exercises 2-3 times a week.  We will get yo uthe brace See me again in 1 week for the 2nd injection

## 2016-03-08 NOTE — Assessment & Plan Note (Signed)
S: Taking her pravachol without side effects or myalgias A/P: No lipid panel in last year, checking lipid panel and adjust pravastatin 20 mg daily as needed.

## 2016-03-08 NOTE — Patient Instructions (Addendum)
We are checking the blood work and have given you a cough syrup today.   We have sent in all the refills today for you.  When you get the knee feeling better start exercising some.   Health Maintenance, Female Adopting a healthy lifestyle and getting preventive care can go a long way to promote health and wellness. Talk with your health care provider about what schedule of regular examinations is right for you. This is a good chance for you to check in with your provider about disease prevention and staying healthy. In between checkups, there are plenty of things you can do on your own. Experts have done a lot of research about which lifestyle changes and preventive measures are most likely to keep you healthy. Ask your health care provider for more information. WEIGHT AND DIET  Eat a healthy diet  Be sure to include plenty of vegetables, fruits, low-fat dairy products, and lean protein.  Do not eat a lot of foods high in solid fats, added sugars, or salt.  Get regular exercise. This is one of the most important things you can do for your health.  Most adults should exercise for at least 150 minutes each week. The exercise should increase your heart rate and make you sweat (moderate-intensity exercise).  Most adults should also do strengthening exercises at least twice a week. This is in addition to the moderate-intensity exercise.  Maintain a healthy weight  Body mass index (BMI) is a measurement that can be used to identify possible weight problems. It estimates body fat based on height and weight. Your health care provider can help determine your BMI and help you achieve or maintain a healthy weight.  For females 56 years of age and older:   A BMI below 18.5 is considered underweight.  A BMI of 18.5 to 24.9 is normal.  A BMI of 25 to 29.9 is considered overweight.  A BMI of 30 and above is considered obese.  Watch levels of cholesterol and blood lipids  You should start  having your blood tested for lipids and cholesterol at 72 years of age, then have this test every 5 years.  You may need to have your cholesterol levels checked more often if:  Your lipid or cholesterol levels are high.  You are older than 71 years of age.  You are at high risk for heart disease.  CANCER SCREENING   Lung Cancer  Lung cancer screening is recommended for adults 39-11 years old who are at high risk for lung cancer because of a history of smoking.  A yearly low-dose CT scan of the lungs is recommended for people who:  Currently smoke.  Have quit within the past 15 years.  Have at least a 30-pack-year history of smoking. A pack year is smoking an average of one pack of cigarettes a day for 1 year.  Yearly screening should continue until it has been 15 years since you quit.  Yearly screening should stop if you develop a health problem that would prevent you from having lung cancer treatment.  Breast Cancer  Practice breast self-awareness. This means understanding how your breasts normally appear and feel.  It also means doing regular breast self-exams. Let your health care provider know about any changes, no matter how small.  If you are in your 20s or 30s, you should have a clinical breast exam (CBE) by a health care provider every 1-3 years as part of a regular health exam.  If you are  46 or older, have a CBE every year. Also consider having a breast X-ray (mammogram) every year.  If you have a family history of breast cancer, talk to your health care provider about genetic screening.  If you are at high risk for breast cancer, talk to your health care provider about having an MRI and a mammogram every year.  Breast cancer gene (BRCA) assessment is recommended for women who have family members with BRCA-related cancers. BRCA-related cancers include:  Breast.  Ovarian.  Tubal.  Peritoneal cancers.  Results of the assessment will determine the need for  genetic counseling and BRCA1 and BRCA2 testing. Cervical Cancer Your health care provider may recommend that you be screened regularly for cancer of the pelvic organs (ovaries, uterus, and vagina). This screening involves a pelvic examination, including checking for microscopic changes to the surface of your cervix (Pap test). You may be encouraged to have this screening done every 3 years, beginning at age 33.  For women ages 66-65, health care providers may recommend pelvic exams and Pap testing every 3 years, or they may recommend the Pap and pelvic exam, combined with testing for human papilloma virus (HPV), every 5 years. Some types of HPV increase your risk of cervical cancer. Testing for HPV may also be done on women of any age with unclear Pap test results.  Other health care providers may not recommend any screening for nonpregnant women who are considered low risk for pelvic cancer and who do not have symptoms. Ask your health care provider if a screening pelvic exam is right for you.  If you have had past treatment for cervical cancer or a condition that could lead to cancer, you need Pap tests and screening for cancer for at least 20 years after your treatment. If Pap tests have been discontinued, your risk factors (such as having a new sexual partner) need to be reassessed to determine if screening should resume. Some women have medical problems that increase the chance of getting cervical cancer. In these cases, your health care provider may recommend more frequent screening and Pap tests. Colorectal Cancer  This type of cancer can be detected and often prevented.  Routine colorectal cancer screening usually begins at 71 years of age and continues through 71 years of age.  Your health care provider may recommend screening at an earlier age if you have risk factors for colon cancer.  Your health care provider may also recommend using home test kits to check for hidden blood in the  stool.  A small camera at the end of a tube can be used to examine your colon directly (sigmoidoscopy or colonoscopy). This is done to check for the earliest forms of colorectal cancer.  Routine screening usually begins at age 90.  Direct examination of the colon should be repeated every 5-10 years through 71 years of age. However, you may need to be screened more often if early forms of precancerous polyps or small growths are found. Skin Cancer  Check your skin from head to toe regularly.  Tell your health care provider about any new moles or changes in moles, especially if there is a change in a mole's shape or color.  Also tell your health care provider if you have a mole that is larger than the size of a pencil eraser.  Always use sunscreen. Apply sunscreen liberally and repeatedly throughout the day.  Protect yourself by wearing long sleeves, pants, a wide-brimmed hat, and sunglasses whenever you are outside. HEART  DISEASE, DIABETES, AND HIGH BLOOD PRESSURE   High blood pressure causes heart disease and increases the risk of stroke. High blood pressure is more likely to develop in:  People who have blood pressure in the high end of the normal range (130-139/85-89 mm Hg).  People who are overweight or obese.  People who are African American.  If you are 69-71 years of age, have your blood pressure checked every 3-5 years. If you are 47 years of age or older, have your blood pressure checked every year. You should have your blood pressure measured twice--once when you are at a hospital or clinic, and once when you are not at a hospital or clinic. Record the average of the two measurements. To check your blood pressure when you are not at a hospital or clinic, you can use:  An automated blood pressure machine at a pharmacy.  A home blood pressure monitor.  If you are between 25 years and 92 years old, ask your health care provider if you should take aspirin to prevent  strokes.  Have regular diabetes screenings. This involves taking a blood sample to check your fasting blood sugar level.  If you are at a normal weight and have a low risk for diabetes, have this test once every three years after 71 years of age.  If you are overweight and have a high risk for diabetes, consider being tested at a younger age or more often. PREVENTING INFECTION  Hepatitis B  If you have a higher risk for hepatitis B, you should be screened for this virus. You are considered at high risk for hepatitis B if:  You were born in a country where hepatitis B is common. Ask your health care provider which countries are considered high risk.  Your parents were born in a high-risk country, and you have not been immunized against hepatitis B (hepatitis B vaccine).  You have HIV or AIDS.  You use needles to inject street drugs.  You live with someone who has hepatitis B.  You have had sex with someone who has hepatitis B.  You get hemodialysis treatment.  You take certain medicines for conditions, including cancer, organ transplantation, and autoimmune conditions. Hepatitis C  Blood testing is recommended for:  Everyone born from [birth year] through 1966.  Anyone with known risk factors for hepatitis C. Sexually transmitted infections (STIs)  You should be screened for sexually transmitted infections (STIs) including gonorrhea and chlamydia if:  You are sexually active and are younger than 71 years of age.  You are older than 71 years of age and your health care provider tells you that you are at risk for this type of infection.  Your sexual activity has changed since you were last screened and you are at an increased risk for chlamydia or gonorrhea. Ask your health care provider if you are at risk.  If you do not have HIV, but are at risk, it may be recommended that you take a prescription medicine daily to prevent HIV infection. This is called pre-exposure prophylaxis  (PrEP). You are considered at risk if:  You are sexually active and do not regularly use condoms or know the HIV status of your partner(s).  You take drugs by injection.  You are sexually active with a partner who has HIV. Talk with your health care provider about whether you are at high risk of being infected with HIV. If you choose to begin PrEP, you should first be tested for HIV. You should  then be tested every 3 months for as long as you are taking PrEP.  PREGNANCY   If you are premenopausal and you may become pregnant, ask your health care provider about preconception counseling.  If you may become pregnant, take 400 to 800 micrograms (mcg) of folic acid every day.  If you want to prevent pregnancy, talk to your health care provider about birth control (contraception). OSTEOPOROSIS AND MENOPAUSE   Osteoporosis is a disease in which the bones lose minerals and strength with aging. This can result in serious bone fractures. Your risk for osteoporosis can be identified using a bone density scan.  If you are 71 years of age or older, or if you are at risk for osteoporosis and fractures, ask your health care provider if you should be screened.  Ask your health care provider whether you should take a calcium or vitamin D supplement to lower your risk for osteoporosis.  Menopause may have certain physical symptoms and risks.  Hormone replacement therapy may reduce some of these symptoms and risks. Talk to your health care provider about whether hormone replacement therapy is right for you.  HOME CARE INSTRUCTIONS   Schedule regular health, dental, and eye exams.  Stay current with your immunizations.   Do not use any tobacco products including cigarettes, chewing tobacco, or electronic cigarettes.  If you are pregnant, do not drink alcohol.  If you are breastfeeding, limit how much and how often you drink alcohol.  Limit alcohol intake to no more than 1 drink per day for  nonpregnant women. One drink equals 12 ounces of beer, 5 ounces of wine, or 1 ounces of hard liquor.  Do not use street drugs.  Do not share needles.  Ask your health care provider for help if you need support or information about quitting drugs.  Tell your health care provider if you often feel depressed.  Tell your health care provider if you have ever been abused or do not feel safe at home.   This information is not intended to replace advice given to you by your health care provider. Make sure you discuss any questions you have with your health care provider.   Document Released: 11/06/2010 Document Revised: 05/14/2014 Document Reviewed: 03/25/2013 Elsevier Interactive Patient Education [employer].

## 2016-03-08 NOTE — Assessment & Plan Note (Signed)
Rx for cough syrup with codeine given to her at visit. Antibiotics or steroids are not indicated. Given refill of flonase and advised to resume that today to help with symptoms.

## 2016-03-08 NOTE — Progress Notes (Signed)
   Subjective:    Patient ID: Kim Fuller, female    DOB: 20-Feb-1946, 70 y.o.   MRN: [id number]  HPI Here for medicare wellness, acute concerns as well. Please see A/P for status and treatment of chronic medical problems.   HPI #2: New complaint of cough for about 3-4 days. Her husband was sick last week and then she started getting it. Lots of cough and some green sputum. Mild fever over the weekend. Overall gradually improving to stable. Sinus drainage and nasal congestion. Some mild sinus pressure. Taking claritin and not taking her flonase right now due to being expired.   Diet: heart healthy  Physical activity: sedentary Depression/mood screen: negative Hearing: intact to whispered voice Visual acuity: grossly normal, performs annual eye exam  ADLs: capable Fall risk: none Home safety: good Cognitive evaluation: intact to orientation, naming, recall and repetition EOL planning: adv directives discussed, not in place  I have personally reviewed and have noted 1. The patient's medical and social history - reviewed today no changes 2. Their use of alcohol, tobacco or illicit drugs 3. Their current medications and supplements 4. The patient's functional ability including ADL's, fall risks, home safety risks and hearing or visual impairment. 5. Diet and physical activities 6. Evidence for depression or mood disorders 7. Care team reviewed and updated (available in snapshot)  Review of Systems  Constitutional: Positive for fever. Negative for activity change, appetite change, chills, fatigue and unexpected weight change.  HENT: Positive for congestion, postnasal drip, rhinorrhea, sinus pressure and sore throat. Negative for ear discharge, ear pain and trouble swallowing.   Eyes: Negative.   Respiratory: Positive for cough. Negative for chest tightness and shortness of breath.   Cardiovascular: Negative for chest pain, palpitations and leg swelling.  Gastrointestinal: Negative for  abdominal distention, abdominal pain, constipation, diarrhea, nausea and vomiting.  Musculoskeletal: Negative.   Skin: Negative.   Neurological: Negative.   Psychiatric/Behavioral: Negative.       Objective:   Physical Exam  Constitutional: She is oriented to person, place, and time. She appears well-developed and well-nourished.  HENT:  Head: Normocephalic and atraumatic.  Bilateral TM normal, oropharynx with redness and clear drainage  Eyes: EOM are normal.  Neck: Normal range of motion.  Cardiovascular: Normal rate and regular rhythm.   Pulmonary/Chest: Effort normal and breath sounds normal. No respiratory distress. She has no wheezes. She has no rales.  Abdominal: Soft. Bowel sounds are normal. She exhibits no distension. There is no tenderness. There is no rebound.  Musculoskeletal: She exhibits no edema.  Lymphadenopathy:    She has no cervical adenopathy.  Neurological: She is alert and oriented to person, place, and time. Coordination normal.  Skin: Skin is warm and dry.  Psychiatric: She has a normal mood and affect.   Vitals:   03/08/16 1100  BP: (!) 150/70  Pulse: 70  Resp: 16  Temp: 98.2 F (36.8 C)  TempSrc: Oral  SpO2: 96%  Weight: 190 lb 6.4 oz (86.4 kg)  Height: 5\' 2"  (1.575 m)      Assessment & Plan:

## 2016-03-08 NOTE — Assessment & Plan Note (Addendum)
S: No side effects from her bisoprolol/hctz and benazepril although she does have cough now from viral URI (if no resolution could be related to ACE-I) A/P BP slightly above goal and she declines change today as prior are at goal. Checking CMP and adjust if needed.

## 2016-03-08 NOTE — Progress Notes (Signed)
Pre visit review using our clinic review tool, if applicable. No additional management support is needed unless otherwise documented below in the visit note. 

## 2016-03-08 NOTE — Assessment & Plan Note (Signed)
Worsening symptoms. Patient given injection today. Tolerated the procedure well. We discussed icing regimen and home exercises. We discussed topical anti-inflammatories. Patient will be fitted for custom brace secondary to the instability. Patient come back in 1 week for second in a series of 4 injections.

## 2016-03-08 NOTE — Assessment & Plan Note (Signed)
Checking hep c screening as well as labs. Gets derm check yearly and counseled about sun safety and mole surveillance. Flu shot done already and pneumonia and tdap are up to date. Shingles completed as well. Counseled about the dangers of distracted driving. Given 10 year screening recommendations. Colonoscopy and mammogram are up to date.

## 2016-03-09 LAB — HEPATITIS C ANTIBODY: HCV AB: NEGATIVE

## 2016-03-15 ENCOUNTER — Ambulatory Visit: Payer: Medicare Other | Admitting: Family Medicine

## 2016-03-15 NOTE — Progress Notes (Signed)
[facility] [address], [place] [address] Phone: [phone] Subjective:    I'm seeing this patient by the request  of:  Kim Koch, MD   CC: Left knee pain follow-up  [id number]  Kim Fuller is a 70 y.o. female coming in with complaint of left knee pain. Seen one month ago and was given an injection in the left knee for moderate to severe degenerative changes of the knee. . Patient 1 week ago started on viscous supplementation. Patient was to continue all other conservative therapy. Patient states is having improvement.  No giving out, no weakness.   Previous imaging:  patient did have x-rays of the left knee February 2017. This was independently visualized by me and showed moderate medial compartment arthritis.patient's imaging on ultrasound showed further degenerative changes.  Past Medical History:  Diagnosis Date  . Abnormal finding on Pap smear    x1  . Diverticulosis   . GERD (gastroesophageal reflux disease)   . Hyperlipidemia   . Hypertension   . Osteopenia    -1.5 @ femoral neck 11/2007  . Reactive airway disease    Past Surgical History:  Procedure Laterality Date  . CARDIOVASCULAR STRESS TEST  03/05/06  . CARPAL TUNNEL RELEASE  2009    bilaterally  . CATARACT EXTRACTION, BILATERAL     Dr Gershon Crane  . COLONOSCOPY W/ POLYPECTOMY  1998   Tics @ 2004 & 2012; Dr Carlean Purl  . HAND SURGERY     Trigger thumb  . TUBAL LIGATION     Social History   Social History  . Marital status: Married    Spouse name: N/A  . Number of children: N/A  . Years of education: N/A   Occupational History  . Systems analyst    Social History Main Topics  . Smoking status: Former Smoker    Quit date: 05/07/1970  . Smokeless tobacco: Never Used     Comment: Smoked as a teen  1962-1972,only up to 3 cigarettes/ day  . Alcohol use No  . Drug use: No  . Sexual activity: Not Asked   Other Topics Concern  . None   Social  History Narrative  . None   Allergies  Allergen Reactions  . Sulfonamide Derivatives     Rash Because of a history of documented adverse serious drug reaction;Medi Alert bracelet  is recommended  . Tramadol Hcl     REACTION: cold sweats, weak, fatigue   Family History  Problem Relation Age of Onset  . Asthma Sister   . Hypertension Sister   . Hypertension Father   . Heart attack Father     MI > 20  . Prostate cancer Father   . Lung cancer Sister     smoker  . Melanoma Sister   . Osteoporosis Sister   . Glaucoma Sister     X 2  . Diverticulosis Sister     2 sisters  . Lung cancer Sister   . Colon polyps Sister     2 sisters with polyps  . Diverticulitis Sister     1 sister  . Hypertension Mother   . Hypertension Brother   . Skin cancer Sister   . Diabetes Neg Hx   . Stroke Neg Hx     Past medical history, social, surgical and family history all reviewed in electronic medical record.  No pertanent information unless stated regarding to the chief complaint.   Review of Systems: No headache, visual  changes, nausea, vomiting, diarrhea, constipation, dizziness, abdominal pain, skin rash, fevers, chills, night sweats, weight loss, swollen lymph nodes, body aches, joint swelling, muscle aches, chest pain, shortness of breath, mood changes.   Objective  Blood pressure 128/78, pulse (!) 52, height 5\' 2"  (1.575 m), weight 190 lb (86.2 kg), SpO2 96 %.  Systems examined below as of 03/16/16 General: NAD A&O x3 mood, affect normal  HEENT: Pupils equal, extraocular movements intact no nystagmus Respiratory: not short of breath at rest or with speaking Cardiovascular: No lower extremity edema, non tender Skin: Warm dry intact with no signs of infection or rash on extremities or on axial skeleton. Abdomen: Soft nontender, no masses Neuro: Cranial nerves  intact, neurovascularly intact in all extremities with 2+ DTRs and 2+ pulses. Lymph: No lymphadenopathy appreciated today  Gait  normal with good balance and coordination.  MSK: Non tender with full range of motion and good stability and symmetric strength and tone of shoulders, elbows, wrist, hips and ankles bilaterally.    Knee: Left Valgus deformity of the left knee significant varicose veins on the left leg compared to the right side. Significant thigh to calf ratio Severe tenderness to palpation over the medial joint line Lacks last 5 of extension Does have significant crepitus Mild instability with valgus force still remaining. Patient also has significant thigh to calf ratio Negative Mcmurray's, Apley's, and Thessalonian tests. painful patellar compression. Patellar glide with moderate crepitus. Patellar and quadriceps tendons unremarkable. Hamstring and quadriceps strength is normal.  Contralateral knee has valgus deformity but nontender Seems stable from previous exam  After informed written and verbal consent, patient was seated on exam table. Left knee was prepped with alcohol swab and utilizing anterolateral approach, patient's left knee space was injected with15 mg/2.5 mL of Orthovisc(sodium hyaluronate) in a prefilled syringe was injected easily into the knee through a 22-gauge needle..Patient tolerated the procedure well without immediate complications.     Impression and Recommendations:     This case required medical decision making of moderate complexity.      Note: This dictation was prepared with Dragon dictation along with smaller phrase technology. Any transcriptional errors that result from this process are unintentional.

## 2016-03-16 ENCOUNTER — Encounter: Payer: Self-pay | Admitting: Family Medicine

## 2016-03-16 ENCOUNTER — Ambulatory Visit (INDEPENDENT_AMBULATORY_CARE_PROVIDER_SITE_OTHER): Payer: Medicare Other | Admitting: Family Medicine

## 2016-03-16 DIAGNOSIS — M1712 Unilateral primary osteoarthritis, left knee: Secondary | ICD-10-CM | POA: Diagnosis not present

## 2016-03-16 NOTE — Patient Instructions (Signed)
2 down 2 to go.  Ice as usual.  Keep being active See you next week for #3!

## 2016-03-16 NOTE — Assessment & Plan Note (Signed)
Doing better  2 out  Of 4 injections RTC in 1 week for 3rd injection.

## 2016-03-22 NOTE — Progress Notes (Signed)
[name] [address], [place] [address] Phone: [phone] Subjective:    I'm seeing this patient by the request  of:  Hoyt Koch, MD   CC: Left knee pain follow-up  [id number]  Kim Fuller is a 70 y.o. female coming in with complaint of left knee pain. Seen one month ago and was given an injection in the left knee for moderate to severe degenerative changes of the knee. Patient is here for third in a series of 4 injections in the left knee for viscous supple mentation. Making progress. Perry minimal low she states. Continues to have pain mostly after working. .   Previous imaging:  patient did have x-rays of the left knee February 2017. This was independently visualized by me and showed moderate medial compartment arthritis.patient's imaging on ultrasound showed further degenerative changes.  Past Medical History:  Diagnosis Date  . Abnormal finding on Pap smear    x1  . Diverticulosis   . GERD (gastroesophageal reflux disease)   . Hyperlipidemia   . Hypertension   . Osteopenia    -1.5 @ femoral neck 11/2007  . Reactive airway disease    Past Surgical History:  Procedure Laterality Date  . CARDIOVASCULAR STRESS TEST  03/05/06  . CARPAL TUNNEL RELEASE  2009    bilaterally  . CATARACT EXTRACTION, BILATERAL     Dr Gershon Crane  . COLONOSCOPY W/ POLYPECTOMY  1998   Tics @ 2004 & 2012; Dr Carlean Purl  . HAND SURGERY     Trigger thumb  . TUBAL LIGATION     Social History   Social History  . Marital status: Married    Spouse name: N/A  . Number of children: N/A  . Years of education: N/A   Occupational History  . Systems analyst    Social History Main Topics  . Smoking status: Former Smoker    Quit date: 05/07/1970  . Smokeless tobacco: Never Used     Comment: Smoked as a teen  1962-1972,only up to 3 cigarettes/ day  . Alcohol use No  . Drug use: No  . Sexual activity: Not Asked   Other Topics Concern  . None     Social History Narrative  . None   Allergies  Allergen Reactions  . Sulfonamide Derivatives     Rash Because of a history of documented adverse serious drug reaction;Medi Alert bracelet  is recommended  . Tramadol Hcl     REACTION: cold sweats, weak, fatigue   Family History  Problem Relation Age of Onset  . Asthma Sister   . Hypertension Sister   . Hypertension Father   . Heart attack Father     MI > 60  . Prostate cancer Father   . Lung cancer Sister     smoker  . Melanoma Sister   . Osteoporosis Sister   . Glaucoma Sister     X 2  . Diverticulosis Sister     2 sisters  . Lung cancer Sister   . Colon polyps Sister     2 sisters with polyps  . Diverticulitis Sister     1 sister  . Hypertension Mother   . Hypertension Brother   . Skin cancer Sister   . Diabetes Neg Hx   . Stroke Neg Hx     Past medical history, social, surgical and family history all reviewed in electronic medical record.  No pertanent information unless stated regarding to the chief complaint.  Review of Systems: No headache, visual changes, nausea, vomiting, diarrhea, constipation, dizziness, abdominal pain, skin rash, fevers, chills, night sweats, weight loss, swollen lymph nodes, body aches, joint swelling, muscle aches, chest pain, shortness of breath, mood changes.   Objective  Blood pressure 128/82, pulse 64, height 5\' 2"  (1.575 m), weight 190 lb (86.2 kg), SpO2 90 %. Body mass index is 34.75 kg/m.   Systems examined below as of 03/23/16 General: NAD A&O x3 mood, affect normal obese HEENT: Pupils equal, extraocular movements intact no nystagmus Respiratory: not short of breath at rest or with speaking Cardiovascular: No lower extremity edema, non tender Skin: Warm dry intact with no signs of infection or rash on extremities or on axial skeleton. Abdomen: Soft nontender, no masses Neuro: Cranial nerves  intact, neurovascularly intact in all extremities with 2+ DTRs and 2+  pulses. Lymph: No lymphadenopathy appreciated today  Gait normal with good balance and coordination.  MSK: Non tender with full range of motion and good stability and symmetric strength and tone of shoulders, elbows, wrist,  hips and ankles bilaterally. Arthritic changes of multiple joints    Knee: Left Valgus deformity of the left knee significant varicose veins on the left leg compared to the right side. Significant thigh to calf ratio Severe tenderness to palpation over the medial joint line Lacks last 5 of extension Does have significant crepitus Mild instability with valgus force still remaining. Patient also has significant thigh to calf ratio Negative Mcmurray's, Apley's, and Thessalonian tests. painful patellar compression. Patellar glide with moderate crepitus. Patellar and quadriceps tendons unremarkable. Hamstring and quadriceps strength is normal.  Contralateral knee has valgus deformity but nontender Seems stable from previous exam  After informed written and verbal consent, patient was seated on exam table. Left knee was prepped with alcohol swab and utilizing anterolateral approach, patient's left knee space was injected with15 mg/2.5 mL of Orthovisc(sodium hyaluronate) in a prefilled syringe was injected easily into the knee through a 22-gauge needle..Patient tolerated the procedure well without immediate complications.     Impression and Recommendations:     This case required medical decision making of moderate complexity.      Note: This dictation was prepared with Dragon dictation along with smaller phrase technology. Any transcriptional errors that result from this process are unintentional.

## 2016-03-23 ENCOUNTER — Ambulatory Visit (INDEPENDENT_AMBULATORY_CARE_PROVIDER_SITE_OTHER): Payer: Medicare Other | Admitting: Family Medicine

## 2016-03-23 ENCOUNTER — Encounter: Payer: Self-pay | Admitting: Family Medicine

## 2016-03-23 DIAGNOSIS — M1712 Unilateral primary osteoarthritis, left knee: Secondary | ICD-10-CM | POA: Diagnosis not present

## 2016-03-23 NOTE — Patient Instructions (Addendum)
Good to see you  Ice is your friend  3 down and one to go!!!! Happy [place] day  See you again real soon!

## 2016-03-23 NOTE — Assessment & Plan Note (Signed)
Dirtiness series of 4 injections given today. We discussed icing regimen and home exercises, we discussed which activities to do an which was to avoid. Follow-up again in 1 week for fourth and final injection.

## 2016-03-27 NOTE — Progress Notes (Signed)
[name] [address], [place] [address] Phone: [phone] Subjective:    I'm seeing this patient by the request  of:  Hoyt Koch, MD   CC: Left knee pain follow-up  [id number]  GENROSE WEATHERLEY is a 70 y.o. female coming in with complaint of left knee pain. . Patient does have severe degenerative joint disease. Seems to be moderate to severe in nature. Patient failed all conservative therapy and is here for fourth and final injection in the knee. Patient statesUnfortunately had pain after the last injection. Having some mild worsening discomfort. Continues to use a cane to walk. Patient is not received her custom brace yet.   Previous imaging:  patient did have x-rays of the left knee February 2017. This was independently visualized by me and showed moderate medial compartment arthritis.patient's imaging on ultrasound showed further degenerative changes.  Past Medical History:  Diagnosis Date  . Abnormal finding on Pap smear    x1  . Diverticulosis   . GERD (gastroesophageal reflux disease)   . Hyperlipidemia   . Hypertension   . Osteopenia    -1.5 @ femoral neck 11/2007  . Reactive airway disease    Past Surgical History:  Procedure Laterality Date  . CARDIOVASCULAR STRESS TEST  03/05/06  . CARPAL TUNNEL RELEASE  2009    bilaterally  . CATARACT EXTRACTION, BILATERAL     Dr Gershon Crane  . COLONOSCOPY W/ POLYPECTOMY  1998   Tics @ 2004 & 2012; Dr Carlean Purl  . HAND SURGERY     Trigger thumb  . TUBAL LIGATION     Social History   Social History  . Marital status: Married    Spouse name: N/A  . Number of children: N/A  . Years of education: N/A   Occupational History  . Systems analyst    Social History Main Topics  . Smoking status: Former Smoker    Quit date: 05/07/1970  . Smokeless tobacco: Never Used     Comment: Smoked as a teen  1962-1972,only up to 3 cigarettes/ day  . Alcohol use No  . Drug use: No  .  Sexual activity: Not on file   Other Topics Concern  . Not on file   Social History Narrative  . No narrative on file   Allergies  Allergen Reactions  . Sulfonamide Derivatives     Rash Because of a history of documented adverse serious drug reaction;Medi Alert bracelet  is recommended  . Tramadol Hcl     REACTION: cold sweats, weak, fatigue   Family History  Problem Relation Age of Onset  . Asthma Sister   . Hypertension Sister   . Hypertension Father   . Heart attack Father     MI > 2  . Prostate cancer Father   . Lung cancer Sister     smoker  . Melanoma Sister   . Osteoporosis Sister   . Glaucoma Sister     X 2  . Diverticulosis Sister     2 sisters  . Lung cancer Sister   . Colon polyps Sister     2 sisters with polyps  . Diverticulitis Sister     1 sister  . Hypertension Mother   . Hypertension Brother   . Skin cancer Sister   . Diabetes Neg Hx   . Stroke Neg Hx     Past medical history, social, surgical and family history all reviewed in electronic medical record.  No pertanent  information unless stated regarding to the chief complaint.   Review of Systems: No headache, visual changes, nausea, vomiting, diarrhea, constipation, dizziness, abdominal pain, skin rash, fevers, chills, night sweats, weight loss, swollen lymph nodes, body aches, joint swelling, muscle aches, chest pain, shortness of breath, mood changes.   Objective    Systems examined below as of 03/28/16 General: NAD A&O x3 mood, affect normal  HEENT: Pupils equal, extraocular movements intact no nystagmus Respiratory: not short of breath at rest or with speaking Cardiovascular: No lower extremity edema, non tender Skin: Warm dry intact with no signs of infection or rash on extremities or on axial skeleton. Abdomen: Soft nontender, no masses Neuro: Cranial nerves  intact, neurovascularly intact in all extremities with 2+ DTRs and 2+ pulses. Lymph: No lymphadenopathy appreciated today    Gait antalgic gait walking with a cane MSK: Non tender with full range of motion and good stability and symmetric strength and tone of shoulders, elbows, wrist,  hips and ankles bilaterally. Arthritic changes of multiple joints    Knee: Left Valgus deformity of the left knee significant varicose veins on the left leg compared to the right side. Significant thigh to calf ratio Severe tenderness to palpation over the medial joint line Lacks last 5 of extension Does have significant crepitus Mild instability with valgus force still remaining. Patient also has significant thigh to calf ratio Negative Mcmurray's, Apley's, and Thessalonian tests. painful patellar compression. Patellar glide with moderate crepitus. Patellar and quadriceps tendons unremarkable. Hamstring and quadriceps strength is normal.  Contralateral knee has valgus deformity but nontender No significant change from previous exam  After informed written and verbal consent, patient was seated on exam table. Left knee was prepped with alcohol swab and utilizing anterolateral approach, patient's left knee space was injected with15 mg/2.5 mL of Orthovisc(sodium hyaluronate) in a prefilled syringe was injected easily into the knee through a 22-gauge needle..Patient tolerated the procedure well without immediate complications.     Impression and Recommendations:     This case required medical decision making of moderate complexity.      Note: This dictation was prepared with Dragon dictation along with smaller phrase technology. Any transcriptional errors that result from this process are unintentional.

## 2016-03-28 ENCOUNTER — Ambulatory Visit (INDEPENDENT_AMBULATORY_CARE_PROVIDER_SITE_OTHER): Payer: Medicare Other | Admitting: Family Medicine

## 2016-03-28 DIAGNOSIS — M1712 Unilateral primary osteoarthritis, left knee: Secondary | ICD-10-CM

## 2016-03-28 NOTE — Assessment & Plan Note (Signed)
Fourth and final injection given today. Patient will be receiving custom brace at some time in the near future. Continue conservative therapy follow-up in 4 weeks.

## 2016-03-28 NOTE — Patient Instructions (Signed)
Good to see you  Ice is your friend  I am hoping not as much pain.  Stay active.  I hope the brace comes soon See me again in 4 weeks.

## 2016-04-05 ENCOUNTER — Ambulatory Visit
Admission: RE | Admit: 2016-04-05 | Discharge: 2016-04-05 | Disposition: A | Discharge status: Home / Self Care | Payer: Medicare Other | Source: Ambulatory Visit | Attending: Obstetrics | Admitting: Obstetrics

## 2016-04-05 DIAGNOSIS — Z1231 Encounter for screening mammogram for malignant neoplasm of breast: Secondary | ICD-10-CM | POA: Diagnosis not present

## 2016-04-19 NOTE — Progress Notes (Signed)
[name] [address] [place], [place] [address] Phone: [phone] Subjective:    I'm seeing this patient by the request  of:  Hoyt Koch, MD   CC: Left knee pain follow-up  [id number]  Kim Fuller is a 70 y.o. female coming in with complaint of left knee pain. . Patient does have severe degenerative joint disease. Seems to be moderate to severe in nature. Patient failed all conservative therapy and Finished viscous supplementation 4 weeks ago. Patient states no improvement. Does have the custom brace at this time. Feels that that has helped. It is considered any replacement in the near future.   Previous imaging:  patient did have x-rays of the left knee February 2017. This was independently visualized by me and showed moderate medial compartment arthritis.patient's imaging on ultrasound showed further degenerative changes.  Past Medical History:  Diagnosis Date  . Abnormal finding on Pap smear    x1  . Diverticulosis   . GERD (gastroesophageal reflux disease)   . Hyperlipidemia   . Hypertension   . Osteopenia    -1.5 @ femoral neck 11/2007  . Reactive airway disease    Past Surgical History:  Procedure Laterality Date  . CARDIOVASCULAR STRESS TEST  03/05/06  . CARPAL TUNNEL RELEASE  2009    bilaterally  . CATARACT EXTRACTION, BILATERAL     Dr Gershon Crane  . COLONOSCOPY W/ POLYPECTOMY  1998   Tics @ 2004 & 2012; Dr Carlean Purl  . HAND SURGERY     Trigger thumb  . TUBAL LIGATION     Social History   Social History  . Marital status: Married    Spouse name: N/A  . Number of children: N/A  . Years of education: N/A   Occupational History  . Systems analyst    Social History Main Topics  . Smoking status: Former Smoker    Quit date: 05/07/1970  . Smokeless tobacco: Never Used     Comment: Smoked as a teen  1962-1972,only up to 3 cigarettes/ day  . Alcohol use No  . Drug use: No  . Sexual activity: Not on file    Other Topics Concern  . Not on file   Social History Narrative  . No narrative on file   Allergies  Allergen Reactions  . Sulfonamide Derivatives     Rash Because of a history of documented adverse serious drug reaction;Medi Alert bracelet  is recommended  . Tramadol Hcl     REACTION: cold sweats, weak, fatigue   Family History  Problem Relation Age of Onset  . Asthma Sister   . Hypertension Sister   . Hypertension Father   . Heart attack Father     MI > 22  . Prostate cancer Father   . Lung cancer Sister     smoker  . Melanoma Sister   . Osteoporosis Sister   . Glaucoma Sister     X 2  . Diverticulosis Sister     2 sisters  . Lung cancer Sister   . Colon polyps Sister     2 sisters with polyps  . Diverticulitis Sister     1 sister  . Hypertension Mother   . Hypertension Brother   . Skin cancer Sister   . Diabetes Neg Hx   . Stroke Neg Hx     Past medical history, social, surgical and family history all reviewed in electronic medical record.  No pertanent information unless stated regarding to the  chief complaint.   Review of Systems: No headache, visual changes, nausea, vomiting, diarrhea, constipation, dizziness, abdominal pain, skin rash, fevers, chills, night sweats, weight loss, swollen lymph nodes, body aches, joint swelling, muscle aches, chest pain, shortness of breath, mood changes.    Objective  Blood pressure 126/78, pulse 64, height 5\' 2"  (1.575 m), weight 190 lb (86.2 kg), SpO2 95 %.    Systems examined below as of 04/20/16 General: NAD A&O x3 mood, affect normal  HEENT: Pupils equal, extraocular movements intact no nystagmus Respiratory: not short of breath at rest or with speaking Cardiovascular: No lower extremity edema, non tender Skin: Warm dry intact with no signs of infection or rash on extremities or on axial skeleton. Abdomen: Soft nontender, no masses Neuro: Cranial nerves  intact, neurovascularly intact in all extremities with 2+  DTRs and 2+ pulses. Lymph: No lymphadenopathy appreciated today  MSK: Non tender with full range of motion and good stability and symmetric strength and tone of shoulders, elbows, wrist,  hips and ankles bilaterally. Arthritic changes of multiple joints No longer walking with a cane but still mild antalgic gait   Knee: Left Valgus deformity of the left knee significant varicose veins on the left leg compared to the right side. Significant thigh to calf ratio Severe tenderness to palpation over the medial joint line still present Lacks last 5 of flexion and extension. Mild instability with valgus force still remaining.  Negative Mcmurray's, Apley's, and Thessalonian tests. painful patellar compression. Patellar glide with moderate crepitus. Patellar and quadriceps tendons unremarkable. Hamstring and quadriceps strength is normal.  Contralateral knee has valgus deformity but nontender No significant change from previous exam       Impression and Recommendations:     This case required medical decision making of moderate complexity.      Note: This dictation was prepared with Dragon dictation along with smaller phrase technology. Any transcriptional errors that result from this process are unintentional.

## 2016-04-20 ENCOUNTER — Encounter: Payer: Self-pay | Admitting: Family Medicine

## 2016-04-20 ENCOUNTER — Ambulatory Visit (INDEPENDENT_AMBULATORY_CARE_PROVIDER_SITE_OTHER): Payer: Medicare Other | Admitting: Family Medicine

## 2016-04-20 DIAGNOSIS — M1712 Unilateral primary osteoarthritis, left knee: Secondary | ICD-10-CM | POA: Diagnosis not present

## 2016-04-20 NOTE — Assessment & Plan Note (Signed)
Patient is failed all other conservative therapy. Hopefully the brace will give her some time but she is going to consider possible replacement in the near future. Patient can come back on an as-needed basis.

## 2016-04-20 NOTE — Patient Instructions (Signed)
Good to see you  Alusio, Mayer Camel or Lorre Nick would be great  I love the brace and should do good Go shopping for pants See me if you need me

## 2016-04-26 DIAGNOSIS — D2262 Melanocytic nevi of left upper limb, including shoulder: Secondary | ICD-10-CM | POA: Diagnosis not present

## 2016-04-26 DIAGNOSIS — D2261 Melanocytic nevi of right upper limb, including shoulder: Secondary | ICD-10-CM | POA: Diagnosis not present

## 2016-04-26 DIAGNOSIS — D2271 Melanocytic nevi of right lower limb, including hip: Secondary | ICD-10-CM | POA: Diagnosis not present

## 2016-04-26 DIAGNOSIS — D2272 Melanocytic nevi of left lower limb, including hip: Secondary | ICD-10-CM | POA: Diagnosis not present

## 2016-04-26 DIAGNOSIS — L309 Dermatitis, unspecified: Secondary | ICD-10-CM | POA: Diagnosis not present

## 2016-04-26 DIAGNOSIS — L821 Other seborrheic keratosis: Secondary | ICD-10-CM | POA: Diagnosis not present

## 2016-04-26 DIAGNOSIS — D225 Melanocytic nevi of trunk: Secondary | ICD-10-CM | POA: Diagnosis not present

## 2016-04-26 DIAGNOSIS — L820 Inflamed seborrheic keratosis: Secondary | ICD-10-CM | POA: Diagnosis not present

## 2016-06-21 DIAGNOSIS — H524 Presbyopia: Secondary | ICD-10-CM | POA: Diagnosis not present

## 2016-06-21 DIAGNOSIS — Z961 Presence of intraocular lens: Secondary | ICD-10-CM | POA: Diagnosis not present

## 2016-08-16 ENCOUNTER — Telehealth: Payer: Self-pay | Admitting: Family Medicine

## 2016-08-16 NOTE — Telephone Encounter (Signed)
Faxed over a statement for medical record signature for Dr. Tamala Julian to sign in regard to knee brace ordered back in 03/2016.  Faxed over last week.  Would like to know if received.

## 2016-08-16 NOTE — Telephone Encounter (Signed)
Contacted patient and she did not fax [place] anything. Contacting [employer] rep to confirm what information is needed.

## 2016-10-26 ENCOUNTER — Ambulatory Visit (INDEPENDENT_AMBULATORY_CARE_PROVIDER_SITE_OTHER): Payer: Medicare Other | Admitting: Nurse Practitioner

## 2016-10-26 ENCOUNTER — Encounter: Payer: Self-pay | Admitting: Nurse Practitioner

## 2016-10-26 VITALS — BP 140/72 | HR 60 | Temp 99.1°F | Ht 62.0 in | Wt 201.0 lb

## 2016-10-26 DIAGNOSIS — M5412 Radiculopathy, cervical region: Secondary | ICD-10-CM

## 2016-10-26 DIAGNOSIS — M62838 Other muscle spasm: Secondary | ICD-10-CM | POA: Diagnosis not present

## 2016-10-26 MED ORDER — CYCLOBENZAPRINE HCL 5 MG PO TABS: 5.0000 mg | ORAL_TABLET | Freq: Every day | ORAL | 0 refills | Status: DC

## 2016-10-26 MED ORDER — DICLOFENAC SODIUM 2 % TD SOLN: 1.0000 "application " | Freq: Two times a day (BID) | TRANSDERMAL | 0 refills | Status: DC

## 2016-10-26 NOTE — Patient Instructions (Addendum)
Alternate between warm and cold compress as needed.  Do neck and shoulder exercise once a day.  Follow up with ortho if no improvement in 1week.  Shoulder Range of Motion Exercises Shoulder range of motion (ROM) exercises are designed to keep the shoulder moving freely. They are often recommended for people who have shoulder pain. Phase 1 exercises When you are able, do this exercise 5-6 days per week, or as told by your health care provider. Work toward doing 2 sets of 10 swings. Pendulum Exercise How To Do This Exercise Lying Down 1. Lie face-down on a bed with your abdomen close to the side of the bed. 2. Let your arm hang over the side of the bed. 3. Relax your shoulder, arm, and hand. 4. Slowly and gently swing your arm forward and back. Do not use your neck muscles to swing your arm. They should be relaxed. If you are struggling to swing your arm, have someone gently swing it for you. When you do this exercise for the first time, swing your arm at a 15 degree angle for 15 seconds, or swing your arm 10 times. As pain lessens over time, increase the angle of the swing to 30-45 degrees. 5. Repeat steps 1-4 with the other arm.  How To Do This Exercise While Standing 1. Stand next to a sturdy chair or table and hold on to it with your hand. 1. Bend forward at the waist. 2. Bend your knees slightly. 3. Relax your other arm and let it hang limp. 4. Relax the shoulder blade of the arm that is hanging and let it drop. 5. While keeping your shoulder relaxed, use body motion to swing your arm in small circles. The first time you do this exercise, swing your arm for about 30 seconds or 10 times. When you do it next time, swing your arm for a little longer. 6. Stand up tall and relax. 7. Repeat steps 1-7, this time changing the direction of the circles. 2. Repeat steps 1-8 with the other arm.  Phase 2 exercises Do these exercises 3-4 times per day on 5-6 days per week or as told by your health  care provider. Work toward holding the stretch for 20 seconds. Stretching Exercise 1 1. Lift your arm straight out in front of you. 2. Bend your arm 90 degrees at the elbow (right angle) so your forearm goes across your body and looks like the letter "L." 3. Use your other arm to gently pull the elbow forward and across your body. 4. Repeat steps 1-3 with the other arm. Stretching Exercise 2 You will need a towel or rope for this exercise. 1. Bend one arm behind your back with the palm facing outward. 2. Hold a towel with your other hand. 3. Reach the arm that holds the towel above your head, and bend that arm at the elbow. Your wrist should be behind your neck. 4. Use your free hand to grab the free end of the towel. 5. With the higher hand, gently pull the towel up behind you. 6. With the lower hand, pull the towel down behind you. 7. Repeat steps 1-6 with the other arm.  Phase 3 exercises Do each of these exercises at four different times of day (sessions) every day or as told by your health care provider. To begin with, repeat each exercise 5 times (repetitions). Work toward doing 3 sets of 12 repetitions or as told by your health care provider. Strengthening Exercise 1 You  will need a light weight for this activity. As you grow stronger, you may use a heavier weight. 1. Standing with a weight in your hand, lift your arm straight out to the side until it is at the same height as your shoulder. 2. Bend your arm at 90 degrees so that your fingers are pointing to the ceiling. 3. Slowly raise your hand until your arm is straight up in the air. 4. Repeat steps 1-3 with the other arm.  Strengthening Exercise 2 You will need a light weight for this activity. As you grow stronger, you may use a heavier weight. 1. Standing with a weight in your hand, gradually move your straight arm in an arc, starting at your side, then out in front of you, then straight up over your head. 2. Gradually move  your other arm in an arc, starting at your side, then out in front of you, then straight up over your head. 3. Repeat steps 1-2 with the other arm.  Strengthening Exercise 3 You will need an elastic band for this activity. As you grow stronger, gradually increase the size of the bands or increase the number of bands that you use at one time. 1. While standing, hold an elastic band in one hand and raise that arm up in the air. 2. With your other hand, pull down the band until that hand is by your side. 3. Repeat steps 1-2 with the other arm.  This information is not intended to replace advice given to you by your health care provider. Make sure you discuss any questions you have with your health care provider. Document Released: 01/20/2003 Document Revised: 12/18/2015 Document Reviewed: 04/19/2014 Elsevier Interactive Patient Education  [employer].

## 2016-10-26 NOTE — Progress Notes (Signed)
Subjective:  Patient ID: Kim Fuller, female    DOB: [birth year]  Age: 71 y.o. MRN: [id number]  CC: Shoulder Pain (shoulder pain-numbness going toward neck area--going on for 2 mo/ knot on left wrist-2 mo)   Shoulder Pain   The pain is present in the left shoulder. This is a new problem. The current episode started more than 1 month ago. There has been no history of extremity trauma. The problem occurs intermittently. The problem has been waxing and waning. The quality of the pain is described as burning, dull and aching. The pain is moderate. Associated symptoms include stiffness and tingling. Pertinent negatives include no fever, inability to bear weight, itching, joint locking, joint swelling, limited range of motion or numbness. The symptoms are aggravated by lying down and activity (neck movement). She has tried acetaminophen for the symptoms. The treatment provided no relief. Family history does not include gout or rheumatoid arthritis. Her past medical history is significant for osteoarthritis. There is no history of diabetes, gout or rheumatoid arthritis.   Left shoulder pain: Intermittent x 60months Worse in last 1week. No improvement with warm compress and tylenol.  Outpatient Medications Prior to Visit  Medication Sig Dispense Refill  . acetaminophen-codeine 120-12 MG/5ML solution Take 5 mLs by mouth every 8 (eight) hours as needed (cough). 120 mL 0  . aspirin 81 MG chewable tablet Chew 81 mg by mouth daily.      . benazepril (LOTENSIN) 40 MG tablet TAKE AS DIRECTED 1/2 TO 1 TABLET BY MOUTH EVERY DAY TO MAINTAIN BLOOD PRESSURE AVERAGE OF 130/85. 90 tablet 3  . bisoprolol-hydrochlorothiazide (ZIAC) 5-6.25 MG tablet TAKE 1 TABLET BY MOUTH ONCE DAILY. 90 tablet 3  . Calcium Carbonate (CALTRATE 600 PO) Take 600 mg by mouth 2 (two) times daily.      . cholecalciferol (VITAMIN D) 1000 UNITS tablet Take 2,000 Units by mouth daily.     . fluticasone (FLONASE) 50 MCG/ACT nasal spray Place  2 sprays into both nostrils daily. 16 g 6  . Misc Natural Products (TART CHERRY ADVANCED PO) Take by mouth at bedtime.    . Multiple Vitamin (MULTIVITAMIN PO) Take by mouth daily.      . Omega-3 Fatty Acids (FISH OIL) 1000 MG CAPS Take 1,000 mg by mouth 2 (two) times daily.      Marland Kitchen omeprazole (PRILOSEC) 20 MG capsule Take 20 mg by mouth daily.      . pravastatin (PRAVACHOL) 20 MG tablet Take 1 tablet (20 mg total) by mouth daily. 90 tablet 3  . TURMERIC PO Take by mouth daily.    . vitamin E 400 UNIT capsule Take 400 Units by mouth daily.      . cyclobenzaprine (FLEXERIL) 5 MG tablet Take 1 tablet (5 mg total) by mouth 3 (three) times daily as needed (pain). (Patient not taking: Reported on 10/26/2016) 60 tablet 1   No facility-administered medications prior to visit.     ROS See HPI  Objective:  BP 140/72   Pulse 60   Temp 99.1 F (37.3 C)   Ht 5\' 2"  (1.575 m)   Wt 201 lb (91.2 kg)   SpO2 97%   BMI 36.76 kg/m   BP Readings from Last 3 Encounters:  10/26/16 140/72  04/20/16 126/78  03/28/16 120/80    Wt Readings from Last 3 Encounters:  10/26/16 201 lb (91.2 kg)  04/20/16 190 lb (86.2 kg)  03/23/16 190 lb (86.2 kg)    Physical Exam  Constitutional: She  is oriented to person, place, and time. No distress.  Neck: Normal range of motion. Neck supple. No JVD present. No thyromegaly present.  Cardiovascular: Normal rate and regular rhythm.   Pulmonary/Chest: Effort normal and breath sounds normal. No stridor.  Musculoskeletal: She exhibits tenderness. She exhibits no edema.       Left shoulder: Normal.       Cervical back: Normal.       Arms: Lymphadenopathy:    She has no cervical adenopathy.  Neurological: She is alert and oriented to person, place, and time.  Skin: Skin is warm and dry. No rash noted.  Vitals reviewed.  Lab Results  Component Value Date   WBC 5.9 02/17/2015   HGB 13.7 02/17/2015   HCT 41.1 02/17/2015   PLT 175.0 02/17/2015   GLUCOSE 91  03/08/2016   CHOL 202 (H) 03/08/2016   TRIG 181.0 (H) 03/08/2016   HDL 60.30 03/08/2016   LDLDIRECT 113.0 02/17/2015   LDLCALC 105 (H) 03/08/2016   ALT 11 03/08/2016   AST 15 03/08/2016   NA 139 03/08/2016   K 3.8 03/08/2016   CL 100 03/08/2016   CREATININE 0.62 03/08/2016   BUN 14 03/08/2016   CO2 33 (H) 03/08/2016   TSH 1.56 02/05/2014   HGBA1C 5.6 11/11/2008    Mm Screening Breast Tomo Bilateral  Result Date: 04/05/2016 CLINICAL DATA:  Screening. EXAM: 2D DIGITAL SCREENING BILATERAL MAMMOGRAM WITH CAD AND ADJUNCT TOMO COMPARISON:  Previous exam(s). ACR Breast Density Category b: There are scattered areas of fibroglandular density. FINDINGS: There are no findings suspicious for malignancy. Images were processed with CAD. IMPRESSION: No mammographic evidence of malignancy. A result letter of this screening mammogram will be mailed directly to the patient. RECOMMENDATION: Screening mammogram in one year. (Code:SM-B-01Y) BI-RADS CATEGORY  1: Negative. Electronically Signed   By: Nolon Nations M.D.   On: 04/06/2016 07:56    Assessment & Plan:   Kim Fuller was seen today for shoulder pain.  Diagnoses and all orders for this visit:  Cervical radiculopathy -     Diclofenac Sodium (PENNSAID) 2 % SOLN; Place 1 application onto the skin 2 (two) times daily. -     cyclobenzaprine (FLEXERIL) 5 MG tablet; Take 1 tablet (5 mg total) by mouth at bedtime.  Cervical paraspinal muscle spasm -     Diclofenac Sodium (PENNSAID) 2 % SOLN; Place 1 application onto the skin 2 (two) times daily. -     cyclobenzaprine (FLEXERIL) 5 MG tablet; Take 1 tablet (5 mg total) by mouth at bedtime.   I have changed Kim Fuller's cyclobenzaprine. I am also having her start on Diclofenac Sodium. Additionally, I am having her maintain her aspirin, Calcium Carbonate (CALTRATE 600 PO), Fish Oil, Multiple Vitamin (MULTIVITAMIN PO), vitamin E, cholecalciferol, omeprazole, Misc Natural Products (TART CHERRY ADVANCED PO),  TURMERIC PO, bisoprolol-hydrochlorothiazide, pravastatin, benazepril, fluticasone, and acetaminophen-codeine.  Meds ordered this encounter  Medications  . Diclofenac Sodium (PENNSAID) 2 % SOLN    Sig: Place 1 application onto the skin 2 (two) times daily.    Dispense:  2 g    Refill:  0    Order Specific Question:   Supervising Provider    Answer:   Binnie Rail [[id number]]  . cyclobenzaprine (FLEXERIL) 5 MG tablet    Sig: Take 1 tablet (5 mg total) by mouth at bedtime.    Dispense:  14 tablet    Refill:  0    Order Specific Question:   Supervising Provider  Answer:   Binnie Rail [[id number]]    Follow-up: Return if symptoms worsen or fail to improve.  Wilfred Lacy, NP

## 2016-11-14 ENCOUNTER — Ambulatory Visit: Payer: Medicare Other | Admitting: Nurse Practitioner

## 2016-11-14 ENCOUNTER — Ambulatory Visit (INDEPENDENT_AMBULATORY_CARE_PROVIDER_SITE_OTHER): Payer: Medicare Other | Admitting: Nurse Practitioner

## 2016-11-14 ENCOUNTER — Telehealth: Payer: Self-pay | Admitting: Nurse Practitioner

## 2016-11-14 ENCOUNTER — Encounter: Payer: Self-pay | Admitting: Nurse Practitioner

## 2016-11-14 VITALS — BP 138/76 | HR 76 | Temp 98.5°F | Ht 62.0 in | Wt 203.0 lb

## 2016-11-14 DIAGNOSIS — M5412 Radiculopathy, cervical region: Secondary | ICD-10-CM

## 2016-11-14 DIAGNOSIS — M62838 Other muscle spasm: Secondary | ICD-10-CM | POA: Diagnosis not present

## 2016-11-14 DIAGNOSIS — M65331 Trigger finger, right middle finger: Secondary | ICD-10-CM | POA: Diagnosis not present

## 2016-11-14 MED ORDER — CYCLOBENZAPRINE HCL 5 MG PO TABS: 5.0000 mg | ORAL_TABLET | Freq: Every day | ORAL | 0 refills | Status: DC

## 2016-11-14 MED ORDER — PREDNISONE 10 MG (21) PO TBPK: ORAL_TABLET | ORAL | 0 refills | Status: DC

## 2016-11-14 NOTE — Progress Notes (Signed)
Subjective:  Patient ID: Kim Fuller, female    DOB: 04/02/1946  Age: 71 y.o. MRN: [id number]  CC: Follow-up (shoulder still tight up near midline/ right middle finger pain--1 mo)   Shoulder Pain   The pain is present in the neck and left shoulder. This is a recurrent problem. The current episode started more than 1 month ago. There has been no history of extremity trauma. The problem occurs intermittently. The problem has been waxing and waning. The quality of the pain is described as aching and dull. Associated symptoms include a limited range of motion and stiffness. Pertinent negatives include no fever, inability to bear weight, itching, joint locking, joint swelling, numbness or tingling. The symptoms are aggravated by activity and lying down. She has tried acetaminophen, heat, cold, rest, NSAIDS and movement for the symptoms. The treatment provided mild relief. Her past medical history is significant for osteoarthritis.  Hand Pain   The incident occurred more than 1 week ago. There was no injury mechanism. Pain location: right middle finger. The quality of the pain is described as aching and cramping. The pain does not radiate. The pain has been constant since the incident. Pertinent negatives include no numbness or tingling. The symptoms are aggravated by lifting, movement and palpation. She has tried NSAIDs for the symptoms. The treatment provided no relief.  difficulty with finger flexion and full extension.  Outpatient Medications Prior to Visit  Medication Sig Dispense Refill  . aspirin 81 MG chewable tablet Chew 81 mg by mouth daily.      . benazepril (LOTENSIN) 40 MG tablet TAKE AS DIRECTED 1/2 TO 1 TABLET BY MOUTH EVERY DAY TO MAINTAIN BLOOD PRESSURE AVERAGE OF 130/85. 90 tablet 3  . bisoprolol-hydrochlorothiazide (ZIAC) 5-6.25 MG tablet TAKE 1 TABLET BY MOUTH ONCE DAILY. 90 tablet 3  . Calcium Carbonate (CALTRATE 600 PO) Take 600 mg by mouth 2 (two) times daily.      .  cholecalciferol (VITAMIN D) 1000 UNITS tablet Take 2,000 Units by mouth daily.     . Diclofenac Sodium (PENNSAID) 2 % SOLN Place 1 application onto the skin 2 (two) times daily. 2 g 0  . fluticasone (FLONASE) 50 MCG/ACT nasal spray Place 2 sprays into both nostrils daily. 16 g 6  . Misc Natural Products (TART CHERRY ADVANCED PO) Take by mouth at bedtime.    . Multiple Vitamin (MULTIVITAMIN PO) Take by mouth daily.      . Omega-3 Fatty Acids (FISH OIL) 1000 MG CAPS Take 1,000 mg by mouth 2 (two) times daily.      Marland Kitchen omeprazole (PRILOSEC) 20 MG capsule Take 20 mg by mouth daily.      . pravastatin (PRAVACHOL) 20 MG tablet Take 1 tablet (20 mg total) by mouth daily. 90 tablet 3  . TURMERIC PO Take by mouth daily.    . vitamin E 400 UNIT capsule Take 400 Units by mouth daily.      . cyclobenzaprine (FLEXERIL) 5 MG tablet Take 1 tablet (5 mg total) by mouth at bedtime. 14 tablet 0  . acetaminophen-codeine 120-12 MG/5ML solution Take 5 mLs by mouth every 8 (eight) hours as needed (cough). (Patient not taking: Reported on 11/14/2016) 120 mL 0   No facility-administered medications prior to visit.     ROS See HPI  Objective:  BP 138/76   Pulse 76   Temp 98.5 F (36.9 C)   Ht 5\' 2"  (1.575 m)   Wt 203 lb (92.1 kg)   SpO2  97%   BMI 37.13 kg/m   BP Readings from Last 3 Encounters:  11/14/16 138/76  10/26/16 140/72  04/20/16 126/78    Wt Readings from Last 3 Encounters:  11/14/16 203 lb (92.1 kg)  10/26/16 201 lb (91.2 kg)  04/20/16 190 lb (86.2 kg)    Physical Exam  Constitutional: She is oriented to person, place, and time.  Neck: Normal range of motion. Neck supple.  Cardiovascular: Normal rate.   Pulmonary/Chest: Effort normal.  Musculoskeletal: She exhibits tenderness. She exhibits no edema.       Left shoulder: She exhibits tenderness, pain and spasm. She exhibits normal range of motion, no bony tenderness, no swelling, no effusion, no crepitus, no deformity, normal pulse and  normal strength.       Left elbow: Normal.       Left wrist: Normal.       Left upper arm: Normal.       Right hand: She exhibits decreased range of motion and tenderness. Normal sensation noted. Normal strength noted.       Left hand: Normal.       Hands: Lymphadenopathy:    She has no cervical adenopathy.  Neurological: She is alert and oriented to person, place, and time.  Skin: Skin is warm and dry. No rash noted. No erythema.  Vitals reviewed.   Lab Results  Component Value Date   WBC 5.9 02/17/2015   HGB 13.7 02/17/2015   HCT 41.1 02/17/2015   PLT 175.0 02/17/2015   GLUCOSE 91 03/08/2016   CHOL 202 (H) 03/08/2016   TRIG 181.0 (H) 03/08/2016   HDL 60.30 03/08/2016   LDLDIRECT 113.0 02/17/2015   LDLCALC 105 (H) 03/08/2016   ALT 11 03/08/2016   AST 15 03/08/2016   NA 139 03/08/2016   K 3.8 03/08/2016   CL 100 03/08/2016   CREATININE 0.62 03/08/2016   BUN 14 03/08/2016   CO2 33 (H) 03/08/2016   TSH 1.56 02/05/2014   HGBA1C 5.6 11/11/2008    Mm Screening Breast Tomo Bilateral  Result Date: 04/05/2016 CLINICAL DATA:  Screening. EXAM: 2D DIGITAL SCREENING BILATERAL MAMMOGRAM WITH CAD AND ADJUNCT TOMO COMPARISON:  Previous exam(s). ACR Breast Density Category b: There are scattered areas of fibroglandular density. FINDINGS: There are no findings suspicious for malignancy. Images were processed with CAD. IMPRESSION: No mammographic evidence of malignancy. A result letter of this screening mammogram will be mailed directly to the patient. RECOMMENDATION: Screening mammogram in one year. (Code:SM-B-01Y) BI-RADS CATEGORY  1: Negative. Electronically Signed   By: Nolon Nations M.D.   On: 04/06/2016 07:56    Assessment & Plan:   Kim Fuller was seen today for follow-up.  Diagnoses and all orders for this visit:  Cervical radiculopathy -     predniSONE (STERAPRED UNI-PAK 21 TAB) 10 MG (21) TBPK tablet; Take as directed on package -     cyclobenzaprine (FLEXERIL) 5 MG tablet;  Take 1 tablet (5 mg total) by mouth at bedtime.  Cervical paraspinal muscle spasm -     predniSONE (STERAPRED UNI-PAK 21 TAB) 10 MG (21) TBPK tablet; Take as directed on package -     cyclobenzaprine (FLEXERIL) 5 MG tablet; Take 1 tablet (5 mg total) by mouth at bedtime.  Trigger middle finger of right hand   I am having Kim Fuller start on predniSONE. I am also having her maintain her aspirin, Calcium Carbonate (CALTRATE 600 PO), Fish Oil, Multiple Vitamin (MULTIVITAMIN PO), vitamin E, cholecalciferol, omeprazole, Misc Natural Products (TART CHERRY  ADVANCED PO), TURMERIC PO, bisoprolol-hydrochlorothiazide, pravastatin, benazepril, fluticasone, acetaminophen-codeine, Diclofenac Sodium, and cyclobenzaprine.  Meds ordered this encounter  Medications  . predniSONE (STERAPRED UNI-PAK 21 TAB) 10 MG (21) TBPK tablet    Sig: Take as directed on package    Dispense:  21 tablet    Refill:  0    Order Specific Question:   Supervising Provider    Answer:   Cassandria Anger [[id number]]  . cyclobenzaprine (FLEXERIL) 5 MG tablet    Sig: Take 1 tablet (5 mg total) by mouth at bedtime.    Dispense:  14 tablet    Refill:  0    Order Specific Question:   Supervising Provider    Answer:   Cassandria Anger [[id number]]    Follow-up: Return if symptoms worsen or fail to improve.  Wilfred Lacy, NP

## 2016-11-14 NOTE — Patient Instructions (Addendum)
Follow up with sports medicine (Dr. Tamala Julian).  Continue neck and shoulder exercise.  Wear finger splint till as much as possible.

## 2016-11-14 NOTE — Telephone Encounter (Signed)
Left vm for pt to call back, need to schedule an appt with Dr. Tamala Julian for shoulder pain--per Baldo Ash.

## 2016-11-15 NOTE — Telephone Encounter (Signed)
Pt returned your call. I have her scheduled to see Dr Tamala Julian on Monday, 11/19/16.

## 2016-11-17 NOTE — Progress Notes (Signed)
[facility] [place],  [address] Phone: [phone] Subjective:    CC: Shoulder pain, left   ZYS:[id number]  Kim Fuller is a 71 y.o. female coming in with complaint of left  been giving her worsening pain over the course of time. Patient states starting wake her up at night. States that there is some mild weakness with it. Has had this pain for quite some time.Does not remember any true injury.Is affecting daily activities such as dressing    Past Medical History:  Diagnosis Date  . Abnormal finding on Pap smear    x1  . Diverticulosis   . GERD (gastroesophageal reflux disease)   . Hyperlipidemia   . Hypertension   . Osteopenia    -1.5 @ femoral neck 11/2007  . Reactive airway disease    Past Surgical History:  Procedure Laterality Date  . CARDIOVASCULAR STRESS TEST  03/05/06  . CARPAL TUNNEL RELEASE  2009    bilaterally  . CATARACT EXTRACTION, BILATERAL     Dr Gershon Crane  . COLONOSCOPY W/ POLYPECTOMY  1998   Tics @ 2004 & 2012; Dr Carlean Purl  . HAND SURGERY     Trigger thumb  . TUBAL LIGATION     Social History   Social History  . Marital status: Married    Spouse name: N/A  . Number of children: N/A  . Years of education: N/A   Occupational History  . Systems analyst    Social History Main Topics  . Smoking status: Former Smoker    Quit date: 05/07/1970  . Smokeless tobacco: Never Used     Comment: Smoked as a teen  1962-1972,only up to 3 cigarettes/ day  . Alcohol use No  . Drug use: No  . Sexual activity: Not on file   Other Topics Concern  . Not on file   Social History Narrative  . No narrative on file   Allergies  Allergen Reactions  . Sulfonamide Derivatives     Rash Because of a history of documented adverse serious drug reaction;Medi Alert bracelet  is recommended  . Tramadol Hcl     REACTION: cold sweats, weak, fatigue   Family History  Problem Relation Age of Onset  . Asthma Sister   .  Hypertension Sister   . Hypertension Father   . Heart attack Father        MI > 1  . Prostate cancer Father   . Lung cancer Sister        smoker  . Melanoma Sister   . Osteoporosis Sister   . Glaucoma Sister        X 2  . Diverticulosis Sister        2 sisters  . Lung cancer Sister   . Colon polyps Sister        2 sisters with polyps  . Diverticulitis Sister        1 sister  . Hypertension Mother   . Hypertension Brother   . Skin cancer Sister   . Diabetes Neg Hx   . Stroke Neg Hx     Past medical history, social, surgical and family history all reviewed in electronic medical record.  No pertanent information unless stated regarding to the chief complaint.   Review of Systems:Review of systems updated and as accurate as of 11/19/16  No headache, visual changes, nausea, vomiting, diarrhea, constipation, dizziness, abdominal pain, skin rash, fevers, chills, night sweats, weight loss, swollen lymph nodes,  body aches, joint swelling,  chest pain, shortness of breath, mood changes.  Positive muscle aches  Objective  Blood pressure 122/72, pulse 71, height 5\' 2"  (1.575 m), weight 206 lb (93.4 kg), SpO2 97 %. Systems examined below as of 11/19/16   General: No apparent distress alert and oriented x3 mood and affect normal, dressed appropriately.  HEENT: Pupils equal, extraocular movements intact  Respiratory: Patient's speak in full sentences and does not appear short of breath  Cardiovascular: No lower extremity edema, non tender, no erythema  Skin: Warm dry intact with no signs of infection or rash on extremities or on axial skeleton.  Abdomen: Soft nontender  Neuro: Cranial nerves II through XII are intact, neurovascularly intact in all extremities with 2+ DTRs and 2+ pulses.  Lymph: No lymphadenopathy of posterior or anterior cervical chain or axillae bilaterally.  Gait normal with good balance and coordination.  MSK:  Non tender with full range of motion and good stability  and symmetric strength and tone of  elbows, wrist, hip, knee and ankles bilaterally.  Shoulder: left Inspection reveals no abnormalities, atrophy or asymmetry. Palpation is normal with no tenderness over AC joint or bicipital groove. ROM is full in all planes passively. Rotator cuff strength normal throughout. signs of impingement with positive Neer and Hawkin's tests, but negative empty can sign. Speeds and Yergason's tests normal. No labral pathology noted with negative Obrien's, negative clunk and good stability. Normal scapular function observed. No painful arc and no drop arm sign. No apprehension sign  MSK US performed of: left This study was ordered, performed, and interpreted by Charlann Boxer D.O.  Shoulder:   Supraspinatus:  Appears normal on long and transverse viewsSome degenerative changes, Bursal bulge seen with shoulder abduction on impingement view. Infraspinatus:  Appears normal on long and transverse views. Significant increase in Doppler flow Subscapularis:  Appears normal on long and transverse views. Positive bursa Teres Minor:  Appears normal on long and transverse views. AC joint:   Mild to moderate arthritis Glenohumeral Joint:  Appears normal without effusion. Glenoid Labrum:  Intact without visualized tears. Biceps Tendon:  Appears normal on long and transverse views, no fraying of tendon, tendon located in intertubercular groove, no subluxation with shoulder internal or external rotation.  Impression: Subacromial bursitis  Procedure: Real-time Ultrasound Guided Injection of left glenohumeral joint Device: GE Logiq E  Ultrasound guided injection is preferred based studies that show increased duration, increased effect, greater accuracy, decreased procedural pain, increased response rate with ultrasound guided versus blind injection.  Verbal informed consent obtained.  Time-out conducted.  Noted no overlying erythema, induration, or other signs of local  infection.  Skin prepped in a sterile fashion.  Local anesthesia: Topical Ethyl chloride.  With sterile technique and under real time ultrasound guidance:  Joint visualized.  21g 2 inch needle inserted posterior approach. Pictures taken for needle placement. Patient did have injection of 2 cc of 0.5% Marcaine, and 1cc of Kenalog 40 mg/dL. Completed without difficulty  Pain immediately resolved suggesting accurate placement of the medication.  Advised to call if fevers/chills, erythema, induration, drainage, or persistent bleeding.  Images permanently stored and available for review in the ultrasound unit.  Impression: Technically successful ultrasound guided injection.  Procedure note 06301; 15 minutes spent for Therapeutic exercises as stated in above notes.  This included exercises focusing on stretching, strengthening, with significant focus on eccentric aspects.  Shoulder Exercises that included:  Basic scapular stabilization to include adduction and depression of scapula Scaption, focusing on  proper movement and good control Internal and External rotation utilizing a theraband, with elbow tucked at side entire time Rows with theraband which was given  Proper technique shown and discussed handout in great detail with ATC.  All questions were discussed and answered.     Impression and Recommendations:     This case required medical decision making of moderate complexity.      Note: This dictation was prepared with Dragon dictation along with smaller phrase technology. Any transcriptional errors that result from this process are unintentional.

## 2016-11-19 ENCOUNTER — Encounter: Payer: Self-pay | Admitting: Family Medicine

## 2016-11-19 ENCOUNTER — Ambulatory Visit (INDEPENDENT_AMBULATORY_CARE_PROVIDER_SITE_OTHER): Payer: Medicare Other | Admitting: Family Medicine

## 2016-11-19 ENCOUNTER — Ambulatory Visit: Payer: Self-pay

## 2016-11-19 VITALS — BP 122/72 | HR 71 | Ht 62.0 in | Wt 206.0 lb

## 2016-11-19 DIAGNOSIS — M25512 Pain in left shoulder: Secondary | ICD-10-CM | POA: Diagnosis not present

## 2016-11-19 DIAGNOSIS — M7552 Bursitis of left shoulder: Secondary | ICD-10-CM

## 2016-11-19 NOTE — Progress Notes (Signed)
Pre visit review using our clinic review tool, if applicable. No additional management support is needed unless otherwise documented below in the visit note. 

## 2016-11-19 NOTE — Assessment & Plan Note (Signed)
Patient does have what appears to be more of a shoulder bursitis. Some degenerative changes of the rotator cuff noted.We will continue to monitor.patient wanted to conservative therapy. We'll try icing regimen, home exercise, which activities to do in which ones to avoid. Home exercises given and work with Product/process development scientist. Follow-up again in 4-6 weeks if worsening symptomsconsider other type of injection. Injected today.

## 2016-11-19 NOTE — Patient Instructions (Signed)
head injury Ice 20 minutes 2 times daily. Usually after activity and before bed. Exercises 3 times a week.  pennsaid pinkie amount topically 2 times daily as needed.  Keep hands within peripheral vision For the trigger finger wear brace at night for 1 week See me again in 3 weeks and if not better we could inject the shoulder.

## 2016-11-23 ENCOUNTER — Other Ambulatory Visit: Payer: Self-pay

## 2016-12-11 ENCOUNTER — Ambulatory Visit: Payer: Medicare Other | Admitting: Family Medicine

## 2016-12-14 ENCOUNTER — Ambulatory Visit: Payer: Medicare Other | Admitting: Family Medicine

## 2017-02-11 DIAGNOSIS — Z23 Encounter for immunization: Secondary | ICD-10-CM | POA: Diagnosis not present

## 2017-02-15 DIAGNOSIS — M1712 Unilateral primary osteoarthritis, left knee: Secondary | ICD-10-CM | POA: Diagnosis not present

## 2017-03-03 ENCOUNTER — Ambulatory Visit: Payer: Self-pay | Admitting: Orthopedic Surgery

## 2017-03-19 ENCOUNTER — Ambulatory Visit: Payer: Medicare Other | Admitting: Internal Medicine

## 2017-03-21 DIAGNOSIS — N952 Postmenopausal atrophic vaginitis: Secondary | ICD-10-CM | POA: Diagnosis not present

## 2017-03-21 DIAGNOSIS — N814 Uterovaginal prolapse, unspecified: Secondary | ICD-10-CM | POA: Diagnosis not present

## 2017-03-21 DIAGNOSIS — N905 Atrophy of vulva: Secondary | ICD-10-CM | POA: Diagnosis not present

## 2017-03-23 ENCOUNTER — Ambulatory Visit: Payer: Self-pay | Admitting: Orthopedic Surgery

## 2017-03-23 NOTE — H&P (Signed)
Kim Fuller DOB: [id number] Married / Language: English / Race: White Female Date of admission: April 03, 2017  Chief complaint: Left knee pain History of Present Illness  The patient is a 71 year old female who comes in  for a preoperative History and Physical. The patient is scheduled for a left unicompartmental knee arthroplasty to be performed by Dr. [name]. Aluisio, MD at [facility] on 04-03-2017. The patient was seen for a second opinion. The patient reports left knee symptoms including: pain, swelling, giving way and weakness which began 1 year(s) ago without any known injury. The patient describes the severity of the symptoms as moderate in severity. The patient describes their pain as dull and aching. The patient feels that the symptoms are worsening. Prior to being seen, the patient was previously evaluated by Dr. [name], sports med MD. Past treatment for this problem has included intra-articular injection of corticosteroids (and viscosupplementation). and include decreased range of motion and difficulty bearing weight. Onset of symptoms was gradual. Symptoms are exacerbated by weight bearing. Current treatment includes nonsteroidal anti-inflammatory drugs (Ibuprofen) and non-opioid analgesics (Tylenol). She states that she has pain with all activities. If she takes a few steps she generally is okay but anytime she is walking outside of the house she has a significant pain which is limiting what she can and cannot do. She will occasionally get some swelling. The knee does not give out on her. It has not locked up on her. She has had cortisone and viscous supplement injections without benefit. She had a stage where the knee is having a very negative effect on her lifestyle and is limiting what she can and cannot do. The pain is along the medial aspect of her knee. Radiographs-AP and lateral of the knees shows she has bone-on-bone arthritis in the medial compartment  of the LEFT knee with no evidence of lateral or patellofemoral disease. Her RIGHT knee looks normal. All of the arthritis is isolated to that medial side. All of her pain is on the medial side. She has failed nonoperative management including cortisone and Visco supplement injections. At this point the most predictable means of improving pain and function will be arthroplasty and she is an excellent candidate for a unicompartmental arthroplasty. We discussed that in detail including differences between a partial and total knee replacement.The patient is ready to go ahead and proceed with surgical treatment. They have been treated conservatively in the past for the above stated problem and despite conservative measures, they continue to have progressive pain and severe functional limitations and dysfunction. They have failed non-operative management including home exercise, medications, and injections. It is felt that they would benefit from undergoing total joint replacement. Risks and benefits of the procedure have been discussed with the patient and they elect to proceed with surgery. There are no active contraindications to surgery such as ongoing infection or rapidly progressive neurological disease.   Problem List/Past Medical  Primary osteoarthritis of left knee (M17.12)  Plantar fasciitis (M72.2) [01/18/2005]: Trigger finger (M65.30) [08/07/2005]: Gastroesophageal Reflux Disease  High blood pressure  Hypercholesterolemia  Cataract  Varicose veins  Diverticulosis  Diverticulitis Of Colon  Past History Osteopenia  Osteoarthritis  Menopause  Mitral Valve Prolapse  Allergies SULFA [01/23/2005]: Rash. TraMADol HCl *ANALGESICS - OPIOID*  Sweats  Family History Cancer  Father, Sister. Chronic Obstructive Lung Disease  Sister. Heart Disease  Father. Hypertension  Father, Mother, Sister. Osteoporosis  Sister.  Social History  Children  2 Current work  status   retired Exercise  Exercises weekly; does other Former drinker  01/17/2017: In the past drank wine only occasionally per week Living situation  live with spouse Marital status  married No history of drug/alcohol rehab  Not under pain contract  Number of flights of stairs before winded  greater than 5 Tobacco / smoke exposure  01/17/2017: no Tobacco use  Former smoker. 01/17/2017: smoke(d) less than 1/2 pack(s) per day  Medication History Aspirin (81MG  Tablet, Oral) Active. Benazepril HCl (40MG  Tablet, Oral) Active. Bisoprolol-Hydrochlorothiazide (5-6.25MG  Tablet, Oral) Active. Caltrate 600 (1500 (600 Ca)MG Tablet, Oral) Active. Cholecalciferol (1000UT/0.028ML Liquid, Oral) Active. Fish Oil (500MG  Capsule, Oral) Active. Pravastatin Sodium (20MG  Tablet, Oral) Active. Tart Cherry Advanced (Oral) Active. Turmeric (450MG  Capsule, Oral) Active. NexIUM (20MG  Capsule DR, Oral) Active. Multivitamin Active.   Past Surgical History Carpal Tunnel Repair  bilateral Cataract Surgery  bilateral Tubal Ligation       Review of Systems  General Not Present- Chills, Fatigue, Fever, Memory Loss, Night Sweats, Weight Gain and Weight Loss. Skin Not Present- Eczema, Hives, Itching, Lesions and Rash. HEENT Not Present- Dentures, Double Vision, Headache, Hearing Loss, Tinnitus and Visual Loss. Respiratory Not Present- Allergies, Chronic Cough, Coughing up blood, Shortness of breath at rest and Shortness of breath with exertion. Cardiovascular Not Present- Chest Pain, Difficulty Breathing Lying Down, Murmur, Palpitations, Racing/skipping heartbeats and Swelling. Gastrointestinal Present- Heartburn. Not Present- Abdominal Pain, Bloody Stool, Constipation, Diarrhea, Difficulty Swallowing, Jaundice, Loss of appetitie, Nausea and Vomiting. Female Genitourinary Not Present- Blood in Urine, Discharge, Flank Pain, Incontinence, Painful Urination, Urgency, Urinary frequency, Urinary  Retention, Urinating at Night and Weak urinary stream. Musculoskeletal Present- Back Pain, Joint Pain and Morning Stiffness. Not Present- Joint Swelling, Muscle Pain, Muscle Weakness and Spasms. Neurological Not Present- Blackout spells, Difficulty with balance, Dizziness, Paralysis, Tremor and Weakness. Psychiatric Not Present- Insomnia.  Vitals  Weight: 198 lb Height: 62in Body Surface Area: 1.9 m Body Mass Index: 36.21 kg/m  Pulse: 56 (Regular)  BP: 140/78 (Sitting, Left Arm, Standard)    Physical Exam General Mental Status -Alert, cooperative and good historian. General Appearance-pleasant, Not in acute distress. Orientation-Oriented X3. Build & Nutrition-Well nourished and Well developed.  Head and Neck Head-normocephalic, atraumatic . Neck Global Assessment - supple, no bruit auscultated on the right, no bruit auscultated on the left.  Eye Pupil - Bilateral-Regular and Round. Motion - Bilateral-EOMI.  Chest and Lung Exam Auscultation Breath sounds - clear at anterior chest wall and clear at posterior chest wall. Adventitious sounds - No Adventitious sounds.  Cardiovascular Auscultation Rhythm - Regular rate and rhythm. Heart Sounds - S1 WNL and S2 WNL. Murmurs & Other Heart Sounds - Auscultation of the heart reveals - No Murmurs.  Abdomen Palpation/Percussion Tenderness - Abdomen is non-tender to palpation. Rigidity (guarding) - Abdomen is soft. Auscultation Auscultation of the abdomen reveals - Bowel sounds normal.  Female Genitourinary Note: Not done, not pertinent to present illness   Musculoskeletal Note: Evaluation of the left hip shows flexion to 120 rotation in 30 out 40 and abduction 40 without discomfort. There is no tenderness over the greater trochanter. There is no pain on provocative testing of the hip.Examination of the right hip shows flexion to 120 rotation in 30 abduction 40 and external rotation of 40. There is no  tenderness over the greater trochanter. There is no pain on provocative testing of the hip.Right knee shows no effusion. Range of motion is 0-125. There is no crepitus on range of motion of the knee.  There is no medial or lateral joint line tenderness. There is no instability noted. Her LEFT knee shows no effusion. Her range of motion is 5-125. She is tender medially. There is no lateral tenderness or instability. There is minimal crepitus on range of motion. Gait pattern is antalgic on the LEFT. Pulses, sensation and motor are intact both lower extremities.  Radiographs-AP and lateral of the knees shows she has bone-on-bone arthritis in the medial compartment of the LEFT knee with no evidence of lateral or patellofemoral disease. Her RIGHT knee looks normal.   Assessment & Plan  Primary osteoarthritis of left knee (M17.12)  Note:Surgical Plans: Left Knee Unicompartmental Replacement  Disposition: Home with husband, Straight to Outpatient Therapy at [facility] in [place], [place]  PCP: Dr. Pricilla Holm  IV TXA  Anesthesia Issues: None  Patient was instructed on what medications to stop prior to surgery.  Signed electronically by Joelene Millin, III PA-C

## 2017-03-23 NOTE — H&P (View-Only) (Signed)
Kim Fuller DOB: [birth year] Married / Language: English / Race: White Female Date of admission: April 03, 2017  Chief complaint: Left knee pain History of Present Illness  The patient is a 71 year old female who comes in  for a preoperative History and Physical. The patient is scheduled for a left unicompartmental knee arthroplasty to be performed by Dr. [name]. Aluisio, MD at [facility] on 04-03-2017. The patient was seen for a second opinion. The patient reports left knee symptoms including: pain, swelling, giving way and weakness which began 1 year(s) ago without any known injury. The patient describes the severity of the symptoms as moderate in severity. The patient describes their pain as dull and aching. The patient feels that the symptoms are worsening. Prior to being seen, the patient was previously evaluated by Dr. [name], sports med MD. Past treatment for this problem has included intra-articular injection of corticosteroids (and viscosupplementation). and include decreased range of motion and difficulty bearing weight. Onset of symptoms was gradual. Symptoms are exacerbated by weight bearing. Current treatment includes nonsteroidal anti-inflammatory drugs (Ibuprofen) and non-opioid analgesics (Tylenol). She states that she has pain with all activities. If she takes a few steps she generally is okay but anytime she is walking outside of the house she has a significant pain which is limiting what she can and cannot do. She will occasionally get some swelling. The knee does not give out on her. It has not locked up on her. She has had cortisone and viscous supplement injections without benefit. She had a stage where the knee is having a very negative effect on her lifestyle and is limiting what she can and cannot do. The pain is along the medial aspect of her knee. Radiographs-AP and lateral of the knees shows she has bone-on-bone arthritis in the medial compartment  of the LEFT knee with no evidence of lateral or patellofemoral disease. Her RIGHT knee looks normal. All of the arthritis is isolated to that medial side. All of her pain is on the medial side. She has failed nonoperative management including cortisone and Visco supplement injections. At this point the most predictable means of improving pain and function will be arthroplasty and she is an excellent candidate for a unicompartmental arthroplasty. We discussed that in detail including differences between a partial and total knee replacement.The patient is ready to go ahead and proceed with surgical treatment. They have been treated conservatively in the past for the above stated problem and despite conservative measures, they continue to have progressive pain and severe functional limitations and dysfunction. They have failed non-operative management including home exercise, medications, and injections. It is felt that they would benefit from undergoing total joint replacement. Risks and benefits of the procedure have been discussed with the patient and they elect to proceed with surgery. There are no active contraindications to surgery such as ongoing infection or rapidly progressive neurological disease.   Problem List/Past Medical  Primary osteoarthritis of left knee (M17.12)  Plantar fasciitis (M72.2) [01/18/2005]: Trigger finger (M65.30) [08/07/2005]: Gastroesophageal Reflux Disease  High blood pressure  Hypercholesterolemia  Cataract  Varicose veins  Diverticulosis  Diverticulitis Of Colon  Past History Osteopenia  Osteoarthritis  Menopause  Mitral Valve Prolapse  Allergies SULFA [01/23/2005]: Rash. TraMADol HCl *ANALGESICS - OPIOID*  Sweats  Family History Cancer  Father, Sister. Chronic Obstructive Lung Disease  Sister. Heart Disease  Father. Hypertension  Father, Mother, Sister. Osteoporosis  Sister.  Social History  Children  2 Current work  status   retired Exercise  Exercises weekly; does other Former drinker  01/17/2017: In the past drank wine only occasionally per week Living situation  live with spouse Marital status  married No history of drug/alcohol rehab  Not under pain contract  Number of flights of stairs before winded  greater than 5 Tobacco / smoke exposure  01/17/2017: no Tobacco use  Former smoker. 01/17/2017: smoke(d) less than 1/2 pack(s) per day  Medication History Aspirin (81MG  Tablet, Oral) Active. Benazepril HCl (40MG  Tablet, Oral) Active. Bisoprolol-Hydrochlorothiazide (5-6.25MG  Tablet, Oral) Active. Caltrate 600 (1500 (600 Ca)MG Tablet, Oral) Active. Cholecalciferol (1000UT/0.028ML Liquid, Oral) Active. Fish Oil (500MG  Capsule, Oral) Active. Pravastatin Sodium (20MG  Tablet, Oral) Active. Tart Cherry Advanced (Oral) Active. Turmeric (450MG  Capsule, Oral) Active. NexIUM (20MG  Capsule DR, Oral) Active. Multivitamin Active.   Past Surgical History Carpal Tunnel Repair  bilateral Cataract Surgery  bilateral Tubal Ligation       Review of Systems  General Not Present- Chills, Fatigue, Fever, Memory Loss, Night Sweats, Weight Gain and Weight Loss. Skin Not Present- Eczema, Hives, Itching, Lesions and Rash. HEENT Not Present- Dentures, Double Vision, Headache, Hearing Loss, Tinnitus and Visual Loss. Respiratory Not Present- Allergies, Chronic Cough, Coughing up blood, Shortness of breath at rest and Shortness of breath with exertion. Cardiovascular Not Present- Chest Pain, Difficulty Breathing Lying Down, Murmur, Palpitations, Racing/skipping heartbeats and Swelling. Gastrointestinal Present- Heartburn. Not Present- Abdominal Pain, Bloody Stool, Constipation, Diarrhea, Difficulty Swallowing, Jaundice, Loss of appetitie, Nausea and Vomiting. Female Genitourinary Not Present- Blood in Urine, Discharge, Flank Pain, Incontinence, Painful Urination, Urgency, Urinary frequency, Urinary  Retention, Urinating at Night and Weak urinary stream. Musculoskeletal Present- Back Pain, Joint Pain and Morning Stiffness. Not Present- Joint Swelling, Muscle Pain, Muscle Weakness and Spasms. Neurological Not Present- Blackout spells, Difficulty with balance, Dizziness, Paralysis, Tremor and Weakness. Psychiatric Not Present- Insomnia.  Vitals  Weight: 198 lb Height: 62in Body Surface Area: 1.9 m Body Mass Index: 36.21 kg/m  Pulse: 56 (Regular)  BP: 140/78 (Sitting, Left Arm, Standard)    Physical Exam General Mental Status -Alert, cooperative and good historian. General Appearance-pleasant, Not in acute distress. Orientation-Oriented X3. Build & Nutrition-Well nourished and Well developed.  Head and Neck Head-normocephalic, atraumatic . Neck Global Assessment - supple, no bruit auscultated on the right, no bruit auscultated on the left.  Eye Pupil - Bilateral-Regular and Round. Motion - Bilateral-EOMI.  Chest and Lung Exam Auscultation Breath sounds - clear at anterior chest wall and clear at posterior chest wall. Adventitious sounds - No Adventitious sounds.  Cardiovascular Auscultation Rhythm - Regular rate and rhythm. Heart Sounds - S1 WNL and S2 WNL. Murmurs & Other Heart Sounds - Auscultation of the heart reveals - No Murmurs.  Abdomen Palpation/Percussion Tenderness - Abdomen is non-tender to palpation. Rigidity (guarding) - Abdomen is soft. Auscultation Auscultation of the abdomen reveals - Bowel sounds normal.  Female Genitourinary Note: Not done, not pertinent to present illness   Musculoskeletal Note: Evaluation of the left hip shows flexion to 120 rotation in 30 out 40 and abduction 40 without discomfort. There is no tenderness over the greater trochanter. There is no pain on provocative testing of the hip.Examination of the right hip shows flexion to 120 rotation in 30 abduction 40 and external rotation of 40. There is no  tenderness over the greater trochanter. There is no pain on provocative testing of the hip.Right knee shows no effusion. Range of motion is 0-125. There is no crepitus on range of motion of the knee.  There is no medial or lateral joint line tenderness. There is no instability noted. Her LEFT knee shows no effusion. Her range of motion is 5-125. She is tender medially. There is no lateral tenderness or instability. There is minimal crepitus on range of motion. Gait pattern is antalgic on the LEFT. Pulses, sensation and motor are intact both lower extremities.  Radiographs-AP and lateral of the knees shows she has bone-on-bone arthritis in the medial compartment of the LEFT knee with no evidence of lateral or patellofemoral disease. Her RIGHT knee looks normal.   Assessment & Plan  Primary osteoarthritis of left knee (M17.12)  Note:Surgical Plans: Left Knee Unicompartmental Replacement  Disposition: Home with husband, Straight to Outpatient Therapy at [facility] in [place], [place]  PCP: Dr. Pricilla Holm  IV TXA  Anesthesia Issues: None  Patient was instructed on what medications to stop prior to surgery.  Signed electronically by Joelene Millin, III PA-C

## 2017-03-25 ENCOUNTER — Other Ambulatory Visit (INDEPENDENT_AMBULATORY_CARE_PROVIDER_SITE_OTHER): Payer: Medicare Other

## 2017-03-25 ENCOUNTER — Encounter: Payer: Self-pay | Admitting: Internal Medicine

## 2017-03-25 ENCOUNTER — Ambulatory Visit (INDEPENDENT_AMBULATORY_CARE_PROVIDER_SITE_OTHER): Payer: Medicare Other | Admitting: Internal Medicine

## 2017-03-25 VITALS — BP 138/82 | HR 46 | Temp 98.1°F | Ht 62.0 in | Wt 198.0 lb

## 2017-03-25 DIAGNOSIS — E782 Mixed hyperlipidemia: Secondary | ICD-10-CM

## 2017-03-25 DIAGNOSIS — E785 Hyperlipidemia, unspecified: Secondary | ICD-10-CM | POA: Diagnosis not present

## 2017-03-25 DIAGNOSIS — R7301 Impaired fasting glucose: Secondary | ICD-10-CM

## 2017-03-25 DIAGNOSIS — M1712 Unilateral primary osteoarthritis, left knee: Secondary | ICD-10-CM | POA: Diagnosis not present

## 2017-03-25 DIAGNOSIS — I10 Essential (primary) hypertension: Secondary | ICD-10-CM

## 2017-03-25 LAB — LDL CHOLESTEROL, DIRECT: Direct LDL: 92 mg/dL

## 2017-03-25 LAB — LIPID PANEL
Cholesterol: 179 mg/dL (ref 0–200)
HDL: 45.6 mg/dL (ref >39.00)
NonHDL: 133.7
TRIGLYCERIDES: 216 mg/dL — AB (ref 0.0–149.0)
Total CHOL/HDL Ratio: 4
VLDL: 43.2 mg/dL — AB (ref 0.0–40.0)

## 2017-03-25 LAB — HEMOGLOBIN A1C: HEMOGLOBIN A1C: 5.6 % (ref 4.6–6.5)

## 2017-03-25 NOTE — Patient Instructions (Signed)
We will check the labs today. 

## 2017-03-25 NOTE — Progress Notes (Signed)
   Subjective:    Patient ID: Kim Fuller, female    DOB: January 21, 1946, 71 y.o.   MRN: [id number]  HPI The patient is a 71 YO female coming in for surgical clearance (getting partial knee replacement, able to climb a flight of stairs without getting winded, denies chest pains, denies side effects with medicines, no changes recently), and her blood pressure (BP at goal on her bisoprolol/hctz and benazepril, checking CMP with surgical clearance later this week, denies chest pains, denies SOB, denies headaches), and her hyperlipidemia (taking fish oil and pravastatin, denies new cad or stroke symptoms). Denies new concerns other than left knee pain.  Review of Systems  Constitutional: Negative.   HENT: Negative.   Eyes: Negative.   Respiratory: Negative for cough, chest tightness and shortness of breath.   Cardiovascular: Negative for chest pain, palpitations and leg swelling.  Gastrointestinal: Negative for abdominal distention, abdominal pain, constipation, diarrhea, nausea and vomiting.  Musculoskeletal: Positive for arthralgias.  Skin: Negative.   Neurological: Negative.   Psychiatric/Behavioral: Negative.       Objective:   Physical Exam  Constitutional: She is oriented to person, place, and time. She appears well-developed and well-nourished.  HENT:  Head: Normocephalic and atraumatic.  Eyes: EOM are normal.  Neck: Normal range of motion.  Cardiovascular: Normal rate and regular rhythm.  Pulmonary/Chest: Effort normal and breath sounds normal. No respiratory distress. She has no wheezes. She has no rales.  Abdominal: Soft. Bowel sounds are normal. She exhibits no distension. There is no tenderness. There is no rebound.  Musculoskeletal: She exhibits no edema.  Neurological: She is alert and oriented to person, place, and time. Coordination normal.  Skin: Skin is warm and dry.  Psychiatric: She has a normal mood and affect.   Vitals:   03/25/17 1030  BP: 138/82  Pulse: (!) 46    Temp: 98.1 F (36.7 C)  TempSrc: Oral  SpO2: 98%  Weight: 198 lb (89.8 kg)  Height: 5\' 2"  (1.575 m)   EKG: rate 46, axis normal, intervals normal, sinus, no st or t wave changes, when compared to 2017 no significant change    Assessment & Plan:

## 2017-03-26 ENCOUNTER — Other Ambulatory Visit (HOSPITAL_COMMUNITY): Payer: Self-pay | Admitting: Emergency Medicine

## 2017-03-26 NOTE — Assessment & Plan Note (Signed)
Checking lipid panel and adjust pravastatin as needed.  

## 2017-03-26 NOTE — Progress Notes (Signed)
Subjective:   Kim Fuller is a 71 y.o. female who presents for Medicare Annual (Subsequent) preventive examination.  Review of Systems:  No ROS.  Medicare Wellness Visit. Additional risk factors are reflected in the social history.  Cardiac Risk Factors include: advanced age (>24men, >43 women);dyslipidemia;hypertension;obesity (BMI >30kg/m2) Sleep patterns: does not get up to void, gets up 1-2 times nightly to void and sleeps 7 hours nightly.    Home Safety/Smoke Alarms: Feels safe in home. Smoke alarms in place.  Living environment; residence and Firearm Safety: 2-story house, no firearms. Lives with husband, no needs for DME, good support system Seat Belt Safety/Bike Helmet: Wears seat belt.    Objective:     Vitals: BP 126/78   Pulse (!) 51   Resp 20   Ht 5\' 2"  (1.575 m)   SpO2 96%   BMI 36.21 kg/m   Body mass index is 36.21 kg/m.   Tobacco Social History   Tobacco Use  Smoking Status Former Smoker  . Last attempt to quit: 05/07/1970  . Years since quitting: 46.9  Smokeless Tobacco Never Used  Tobacco Comment   Smoked as a teen  1962-1972,only up to 3 cigarettes/ day     Counseling given: Not Answered Comment: Smoked as a teen  1962-1972,only up to 3 cigarettes/ day   Past Medical History:  Diagnosis Date  . Abnormal finding on Pap smear    x1  . Diverticulosis   . GERD (gastroesophageal reflux disease)   . Hyperlipidemia   . Hypertension   . Osteopenia    -1.5 @ femoral neck 11/2007  . Reactive airway disease    Past Surgical History:  Procedure Laterality Date  . CARDIOVASCULAR STRESS TEST  03/05/06  . CARPAL TUNNEL RELEASE  2009    bilaterally  . CATARACT EXTRACTION, BILATERAL     Dr Gershon Crane  . COLONOSCOPY W/ POLYPECTOMY  1998   Tics @ 2004 & 2012; Dr Carlean Purl  . HAND SURGERY     Trigger thumb  . TUBAL LIGATION     Family History  Problem Relation Age of Onset  . Asthma Sister   . Hypertension Sister   . Hypertension Father   . Heart  attack Father        MI > 45  . Prostate cancer Father   . Lung cancer Sister        smoker  . Melanoma Sister   . Osteoporosis Sister   . Glaucoma Sister        X 2  . Diverticulosis Sister        2 sisters  . Lung cancer Sister   . Colon polyps Sister        2 sisters with polyps  . Diverticulitis Sister        1 sister  . Hypertension Mother   . Hypertension Brother   . Skin cancer Sister   . Diabetes Neg Hx   . Stroke Neg Hx    Social History   Substance and Sexual Activity  Sexual Activity Not on file    Outpatient Encounter Medications as of 03/27/2017  Medication Sig  . aspirin 81 MG chewable tablet Chew 81 mg by mouth daily.    . benazepril (LOTENSIN) 40 MG tablet TAKE AS DIRECTED 1/2 TO 1 TABLET BY MOUTH EVERY DAY TO MAINTAIN BLOOD PRESSURE AVERAGE OF 130/85. (Patient taking differently: Take 20 mg daily by mouth. TAKE AS DIRECTED 1/2 TO 1 TABLET BY MOUTH EVERY DAY TO MAINTAIN  BLOOD PRESSURE AVERAGE OF 130/85.)  . bisoprolol-hydrochlorothiazide (ZIAC) 5-6.25 MG tablet TAKE 1 TABLET BY MOUTH ONCE DAILY.  . Calcium Carbonate (CALTRATE 600 PO) Take 600 mg by mouth 2 (two) times daily.    . cholecalciferol (VITAMIN D) 1000 UNITS tablet Take 1,000 Units daily by mouth.   . fluticasone (FLONASE) 50 MCG/ACT nasal spray Place 2 sprays into both nostrils daily. (Patient taking differently: Place 2 sprays daily as needed into both nostrils for allergies. )  . Misc Natural Products (TART CHERRY ADVANCED PO) Take 1 tablet at bedtime by mouth.   . Multiple Vitamin (MULTIVITAMIN PO) Take 1 tablet daily by mouth.   . Omega-3 Fatty Acids (FISH OIL) 1000 MG CAPS Take 1,000 mg by mouth 2 (two) times daily.    Marland Kitchen omeprazole (PRILOSEC) 20 MG capsule Take 20 mg daily as needed by mouth.   . pravastatin (PRAVACHOL) 20 MG tablet Take 1 tablet (20 mg total) by mouth daily.  Marland Kitchen Propylene Glycol (SYSTANE BALANCE) 0.6 % SOLN Place 1 drop 3 (three) times daily as needed into both eyes (dry eyes).   . Turmeric 500 MG CAPS Take 1 capsule daily by mouth.   . vitamin E 400 UNIT capsule Take 400 Units by mouth daily.    . Misc Natural Products (TART CHERRY ADVANCED PO) Tart Cherry   No facility-administered encounter medications on file as of 03/27/2017.     Activities of Daily Living In your present state of health, do you have any difficulty performing the following activities: 03/27/2017  Hearing? N  Vision? N  Difficulty concentrating or making decisions? N  Walking or climbing stairs? N  Dressing or bathing? N  Doing errands, shopping? N  Preparing Food and eating ? N  Using the Toilet? N  In the past six months, have you accidently leaked urine? N  Do you have problems with loss of bowel control? N  Managing your Medications? N  Managing your Finances? N  Housekeeping or managing your Housekeeping? N  Some recent data might be hidden    Patient Care Team: Hoyt Koch, MD as PCP - General (Internal Medicine)    Assessment:    Physical assessment deferred to PCP.  Exercise Activities and Dietary recommendations Current Exercise Habits: Structured exercise class, Time (Minutes): 50, Frequency (Times/Week): 3, Weekly Exercise (Minutes/Week): 150, Intensity: Mild, Exercise limited by: orthopedic condition(s)  Diet (meal preparation, eat out, water intake, caffeinated beverages, dairy products, fruits and vegetables): in general, a "healthy" diet  , well balanced  Reviewed heart healthy diet, encouraged patient to increase daily water intake. Diet education was provided via handout.  Goals    . patient stated     Increase water intake, I will set out water so I can see it to remind me to drink. Exercise more to help with weight loss.      Fall Risk Fall Risk  03/27/2017 03/25/2017 06/23/2015 05/17/2014 01/29/2013  Falls in the past year? No No Yes No No  Number falls in past yr: - - 1 - -  Injury with Fall? - - Yes - -   Depression Screen PHQ 2/9 Scores  03/27/2017 03/25/2017 06/23/2015 05/17/2014  PHQ - 2 Score 0 0 0 0  PHQ- 9 Score 0 - - -     Cognitive Function MMSE - Mini Mental State Exam 03/27/2017  Orientation to time 5  Orientation to Place 5  Registration 3  Attention/ Calculation 5  Recall 2  Language- name 2 objects  2  Language- repeat 1  Language- follow 3 step command 3  Language- read & follow direction 1  Write a sentence 1  Copy design 1  Total score 29        Immunization History  Administered Date(s) Administered  . Influenza Whole 02/05/2003  . Influenza, High Dose Seasonal PF 02/05/2014, 02/04/2016  . Influenza,inj,Quad PF,6+ Mos 02/17/2015  . Influenza-Unspecified 03/07/2013, 02/11/2017  . Pneumococcal Conjugate-13 03/12/2015  . Pneumococcal Polysaccharide-23 03/20/2007, 01/29/2013  . Td 05/08/1999, 06/09/2009  . Zoster 03/03/2014   Screening Tests Health Maintenance  Topic Date Due  . MAMMOGRAM  04/05/2018  . TETANUS/TDAP  06/10/2019  . COLONOSCOPY  07/26/2020  . INFLUENZA VACCINE  Completed  . DEXA SCAN  Completed  . Hepatitis C Screening  Completed  . PNA vac Low Risk Adult  Completed      Plan:    Continue doing brain stimulating activities (puzzles, reading, adult coloring books, staying active) to keep memory sharp.    Continue to eat heart healthy diet (full of fruits, vegetables, whole grains, lean protein, water--limit salt, fat, and sugar intake) and increase physical activity as tolerated.  I have personally reviewed and noted the following in the patient's chart:   . Medical and social history . Use of alcohol, tobacco or illicit drugs  . Current medications and supplements . Functional ability and status . Nutritional status . Physical activity . Advanced directives . List of other physicians . Vitals . Screenings to include cognitive, depression, and falls . Referrals and appointments  In addition, I have reviewed and discussed with patient certain preventive  protocols, quality metrics, and best practice recommendations. A written personalized care plan for preventive services as well as general preventive health recommendations were provided to patient.     Michiel Cowboy, RN  03/27/2017

## 2017-03-26 NOTE — Progress Notes (Signed)
Surgical clearance Dr Sharlet Salina 03-25-17 epic  HgbA1c, lipid panel,LDL cholesterol 03-25-17   EKG 03-25-17 epic

## 2017-03-26 NOTE — Patient Instructions (Signed)
Kim Fuller  [birth year]   Your procedure is scheduled on: 04-03-17  Report to [facility] Main  Entrance    Report to admitting at 2:00PM   Call this number if you have problems the morning of surgery  [phone]   Remember: ONLY 1 PERSON MAY GO WITH YOU TO SHORT STAY TO GET  READY MORNING OF YOUR SURGERY.    Do not eat food After Midnight. You may have clear liquids from midnight until 8am day of surgery. Nothing by mouth after 8am!     Take these medicines the morning of surgery with A SIP OF WATER: pravastatin, omeprazole if needed, nasal spray if needed, eye drops                                You may not have any metal on your body including hair pins and              piercings  Do not wear jewelry, make-up, lotions, powders or perfumes, deodorant             Do not wear nail polish.  Do not shave  48 hours prior to surgery.             Do not bring valuables to the hospital. [place].  Contacts, dentures or bridgework may not be worn into surgery.  Leave suitcase in the car. After surgery it may be brought to your room.                Please read over the following fact sheets you were given: _____________________________________________________________________    CLEAR LIQUID DIET   Foods Allowed                                                                     Foods Excluded  Coffee and tea, regular and decaf                             liquids that you cannot  Plain Jell-O in any flavor                                             see through such as: Fruit ices (not with fruit pulp)                                     milk, soups, orange juice  Iced Popsicles                                    All solid food Carbonated beverages, regular and diet  Cranberry, grape and apple juices Sports drinks like Gatorade Lightly seasoned clear broth or  consume(fat free) Sugar, honey syrup  Sample Menu Breakfast                                Lunch                                     Supper Cranberry juice                    Beef broth                            Chicken broth Jell-O                                     Grape juice                           Apple juice Coffee or tea                        Jell-O                                      Popsicle                                                Coffee or tea                        Coffee or tea  _____________________________________________________________________  [facility] - Preparing for Surgery Before surgery, you can play an important role.  Because skin is not sterile, your skin needs to be as free of germs as possible.  You can reduce the number of germs on your skin by washing with CHG (chlorahexidine gluconate) soap before surgery.  CHG is an antiseptic cleaner which kills germs and bonds with the skin to continue killing germs even after washing. Please DO NOT use if you have an allergy to CHG or antibacterial soaps.  If your skin becomes reddened/irritated stop using the CHG and inform your nurse when you arrive at Short Stay. Do not shave (including legs and underarms) for at least 48 hours prior to the first CHG shower.  You may shave your face/neck. Please follow these instructions carefully:  1.  Shower with CHG Soap the night before surgery and the  morning of Surgery.  2.  If you choose to wash your hair, wash your hair first as usual with your  normal  shampoo.  3.  After you shampoo, rinse your hair and body thoroughly to remove the  shampoo.                           4.  Use CHG as you would any other liquid soap.  You can apply chg directly  to the skin and wash  Gently with a scrungie or clean washcloth.  5.  Apply the CHG Soap to your body ONLY FROM THE NECK DOWN.   Do not use on face/ open                           Wound or open sores.  Avoid contact with eyes, ears mouth and genitals (private parts).                       Wash face,  Genitals (private parts) with your normal soap.             6.  Wash thoroughly, paying special attention to the area where your surgery  will be performed.  7.  Thoroughly rinse your body with warm water from the neck down.  8.  DO NOT shower/wash with your normal soap after using and rinsing off  the CHG Soap.                9.  Pat yourself dry with a clean towel.            10.  Wear clean pajamas.            11.  Place clean sheets on your bed the night of your first shower and do not  sleep with pets. Day of Surgery : Do not apply any lotions/deodorants the morning of surgery.  Please wear clean clothes to the hospital/surgery center.  FAILURE TO FOLLOW THESE INSTRUCTIONS MAY RESULT IN THE CANCELLATION OF YOUR SURGERY PATIENT SIGNATURE_________________________________  NURSE SIGNATURE__________________________________  ________________________________________________________________________   [name]  An incentive spirometer is a tool that can help keep your lungs clear and active. This tool measures how well you are filling your lungs with each breath. Taking long deep breaths may help reverse or decrease the chance of developing breathing (pulmonary) problems (especially infection) following:  A long period of time when you are unable to move or be active. BEFORE THE PROCEDURE   If the spirometer includes an indicator to show your best effort, your nurse or respiratory therapist will set it to a desired goal.  If possible, sit up straight or lean slightly forward. Try not to slouch.  Hold the incentive spirometer in an upright position. INSTRUCTIONS FOR USE  1. Sit on the edge of your bed if possible, or sit up as far as you can in bed or on a chair. 2. Hold the incentive spirometer in an upright position. 3. Breathe out normally. 4. Place the mouthpiece in your  mouth and seal your lips tightly around it. 5. Breathe in slowly and as deeply as possible, raising the piston or the ball toward the top of the column. 6. Hold your breath for 3-5 seconds or for as long as possible. Allow the piston or ball to fall to the bottom of the column. 7. Remove the mouthpiece from your mouth and breathe out normally. 8. Rest for a few seconds and repeat Steps 1 through 7 at least 10 times every 1-2 hours when you are awake. Take your time and take a few normal breaths between deep breaths. 9. The spirometer may include an indicator to show your best effort. Use the indicator as a goal to work toward during each repetition. 10. After each set of 10 deep breaths, practice coughing to be sure your lungs are clear. If you have an incision (the cut made at the time of  surgery), support your incision when coughing by placing a pillow or rolled up towels firmly against it. Once you are able to get out of bed, walk around indoors and cough well. You may stop using the incentive spirometer when instructed by your caregiver.  RISKS AND COMPLICATIONS  Take your time so you do not get dizzy or light-headed.  If you are in pain, you may need to take or ask for pain medication before doing incentive spirometry. It is harder to take a deep breath if you are having pain. AFTER USE  Rest and breathe slowly and easily.  It can be helpful to keep track of a log of your progress. Your caregiver can provide you with a simple table to help with this. If you are using the spirometer at home, follow these instructions: [place] IF:   You are having difficultly using the spirometer.  You have trouble using the spirometer as often as instructed.  Your pain medication is not giving enough relief while using the spirometer.  You develop fever of 100.5 F (38.1 C) or higher. SEEK IMMEDIATE MEDICAL CARE IF:   You cough up bloody sputum that had not been present before.  You  develop fever of 102 F (38.9 C) or greater.  You develop worsening pain at or near the incision site. MAKE SURE YOU:   Understand these instructions.  Will watch your condition.  Will get help right away if you are not doing well or get worse. Document Released: 09/03/2006 Document Revised: 07/16/2011 Document Reviewed: 11/04/2006 Exit[employer] Patient Information 2014 [employer], [place].   ________________________________________________________________________  WHAT IS A BLOOD TRANSFUSION? Blood Transfusion Information  A transfusion is the replacement of blood or some of its parts. Blood is made up of multiple cells which provide different functions.  Red blood cells carry oxygen and are used for blood loss replacement.  White blood cells fight against infection.  Platelets control bleeding.  Plasma helps clot blood.  Other blood products are available for specialized needs, such as hemophilia or other clotting disorders. BEFORE THE TRANSFUSION  Who gives blood for transfusions?   Healthy volunteers who are fully evaluated to make sure their blood is safe. This is blood bank blood. Transfusion therapy is the safest it has ever been in the practice of medicine. Before blood is taken from a donor, a complete history is taken to make sure that person has no history of diseases nor engages in risky social behavior (examples are intravenous drug use or sexual activity with multiple partners). The donor's travel history is screened to minimize risk of transmitting infections, such as malaria. The donated blood is tested for signs of infectious diseases, such as HIV and hepatitis. The blood is then tested to be sure it is compatible with you in order to minimize the chance of a transfusion reaction. If you or a relative donates blood, this is often done in anticipation of surgery and is not appropriate for emergency situations. It takes many days to process the donated blood. RISKS AND  COMPLICATIONS Although transfusion therapy is very safe and saves many lives, the main dangers of transfusion include:   Getting an infectious disease.  Developing a transfusion reaction. This is an allergic reaction to something in the blood you were given. Every precaution is taken to prevent this. The decision to have a blood transfusion has been considered carefully by your caregiver before blood is given. Blood is not given unless the benefits outweigh the risks. AFTER THE  TRANSFUSION  Right after receiving a blood transfusion, you will usually feel much better and more energetic. This is especially true if your red blood cells have gotten low (anemic). The transfusion raises the level of the red blood cells which carry oxygen, and this usually causes an energy increase.  The nurse administering the transfusion will monitor you carefully for complications. HOME CARE INSTRUCTIONS  No special instructions are needed after a transfusion. You may find your energy is better. Speak with your caregiver about any limitations on activity for underlying diseases you may have. SEEK MEDICAL CARE IF:   Your condition is not improving after your transfusion.  You develop redness or irritation at the intravenous (IV) site. SEEK IMMEDIATE MEDICAL CARE IF:  Any of the following symptoms occur over the next 12 hours:  Shaking chills.  You have a temperature by mouth above 102 F (38.9 C), not controlled by medicine.  Chest, back, or muscle pain.  People around you feel you are not acting correctly or are confused.  Shortness of breath or difficulty breathing.  Dizziness and fainting.  You get a rash or develop hives.  You have a decrease in urine output.  Your urine turns a dark color or changes to pink, red, or brown. Any of the following symptoms occur over the next 10 days:  You have a temperature by mouth above 102 F (38.9 C), not controlled by medicine.  Shortness of  breath.  Weakness after normal activity.  The white part of the eye turns yellow (jaundice).  You have a decrease in the amount of urine or are urinating less often.  Your urine turns a dark color or changes to pink, red, or brown. Document Released: 04/20/2000 Document Revised: 07/16/2011 Document Reviewed: 12/08/2007 [facility] Patient Information 2014 [place], [place].  _______________________________________________________________________

## 2017-03-26 NOTE — Assessment & Plan Note (Signed)
BP at goal on benazepril and bisoprolol/hctz. She does have mildly low HR but no symptoms. She declines change in meds today. CMP with pre-op labs and will adjust if needed.

## 2017-03-26 NOTE — Assessment & Plan Note (Signed)
Will clear for knee replacement surgery. EKG without changes, BP at goal. No red flags to suggest need for further testing, low risk cardiovascular.

## 2017-03-27 ENCOUNTER — Encounter (HOSPITAL_COMMUNITY)
Admission: RE | Admit: 2017-03-27 | Discharge: 2017-03-27 | Disposition: A | Discharge status: Home / Self Care | Payer: Medicare Other | Source: Ambulatory Visit | Attending: Orthopedic Surgery | Admitting: Orthopedic Surgery

## 2017-03-27 ENCOUNTER — Other Ambulatory Visit: Payer: Self-pay

## 2017-03-27 ENCOUNTER — Ambulatory Visit (INDEPENDENT_AMBULATORY_CARE_PROVIDER_SITE_OTHER): Payer: Medicare Other | Admitting: *Deleted

## 2017-03-27 ENCOUNTER — Encounter (HOSPITAL_COMMUNITY): Payer: Self-pay

## 2017-03-27 VITALS — BP 126/78 | HR 51 | Resp 20 | Ht 62.0 in

## 2017-03-27 DIAGNOSIS — Z0000 Encounter for general adult medical examination without abnormal findings: Secondary | ICD-10-CM

## 2017-03-27 DIAGNOSIS — M1712 Unilateral primary osteoarthritis, left knee: Secondary | ICD-10-CM | POA: Insufficient documentation

## 2017-03-27 DIAGNOSIS — Z01812 Encounter for preprocedural laboratory examination: Secondary | ICD-10-CM | POA: Insufficient documentation

## 2017-03-27 LAB — CBC
HCT: 42.4 % (ref 36.0–46.0)
Hemoglobin: 13.7 g/dL (ref 12.0–15.0)
MCH: 31.9 pg (ref 26.0–34.0)
MCHC: 32.3 g/dL (ref 30.0–36.0)
MCV: 98.8 fL (ref 78.0–100.0)
PLATELETS: 177 10*3/uL (ref 150–400)
RBC: 4.29 MIL/uL (ref 3.87–5.11)
RDW: 12.5 % (ref 11.5–15.5)
WBC: 6.3 10*3/uL (ref 4.0–10.5)

## 2017-03-27 LAB — COMPREHENSIVE METABOLIC PANEL
ALT: 21 U/L (ref 14–54)
AST: 25 U/L (ref 15–41)
Albumin: 4.3 g/dL (ref 3.5–5.0)
Alkaline Phosphatase: 55 U/L (ref 38–126)
Anion gap: 7 (ref 5–15)
BUN: 18 mg/dL (ref 6–20)
CALCIUM: 9.5 mg/dL (ref 8.9–10.3)
CHLORIDE: 102 mmol/L (ref 101–111)
CO2: 32 mmol/L (ref 22–32)
Creatinine, Ser: 0.69 mg/dL (ref 0.44–1.00)
GLUCOSE: 95 mg/dL (ref 65–99)
POTASSIUM: 4.2 mmol/L (ref 3.5–5.1)
SODIUM: 141 mmol/L (ref 135–145)
TOTAL PROTEIN: 7.7 g/dL (ref 6.5–8.1)
Total Bilirubin: 0.9 mg/dL (ref 0.3–1.2)

## 2017-03-27 LAB — SURGICAL PCR SCREEN
MRSA, PCR: NEGATIVE
Staphylococcus aureus: NEGATIVE

## 2017-03-27 LAB — ABO/RH: ABO/RH(D): O POS

## 2017-03-27 LAB — APTT: aPTT: 30 s (ref 24–36)

## 2017-03-27 LAB — PROTIME-INR
INR: 1.03
Prothrombin Time: 13.4 s (ref 11.4–15.2)

## 2017-03-27 NOTE — Patient Instructions (Addendum)
Continue doing brain stimulating activities (puzzles, reading, adult coloring books, staying active) to keep memory sharp.    Continue to eat heart healthy diet (full of fruits, vegetables, whole grains, lean protein, water--limit salt, fat, and sugar intake) and increase physical activity as tolerated.   Ms. Glendenning , Thank you for taking time to come for your Medicare Wellness Visit. I appreciate your ongoing commitment to your health goals. Please review the following plan we discussed and let me know if I can assist you in the future.   These are the goals we discussed: Goals    . Patient Stated     Increase water intake, I will set out water so I can see it to remind me to drink. Exercise more to help with weight loss.       This is a list of the screening recommended for you and due dates:  Health Maintenance  Topic Date Due  . Mammogram  04/05/2018  . Tetanus Vaccine  06/10/2019  . Colon Cancer Screening  07/26/2020  . Flu Shot  Completed  . DEXA scan (bone density measurement)  Completed  .  Hepatitis C: One time screening is recommended by Center for Disease Control  (CDC) for  adults born from [birth year] through 1966.   Completed  . Pneumonia vaccines  Completed

## 2017-03-27 NOTE — Progress Notes (Signed)
Medical screening examination/treatment/procedure(s) were performed by non-physician practitioner and as supervising physician I was immediately available for consultation/collaboration. I agree with above. Elizabeth A Crawford, MD 

## 2017-04-01 ENCOUNTER — Other Ambulatory Visit: Payer: Self-pay | Admitting: Internal Medicine

## 2017-04-01 ENCOUNTER — Encounter: Payer: Self-pay | Admitting: Internal Medicine

## 2017-04-01 MED ORDER — BISOPROLOL-HYDROCHLOROTHIAZIDE 5-6.25 MG PO TABS: ORAL_TABLET | ORAL | 1 refills | Status: DC

## 2017-04-01 MED ORDER — BENAZEPRIL HCL 40 MG PO TABS: 20.0000 mg | ORAL_TABLET | Freq: Every day | ORAL | 1 refills | Status: DC

## 2017-04-01 NOTE — Addendum Note (Signed)
Addended by: Earnstine Regal on: 04/01/2017 02:43 PM   Modules accepted: Orders

## 2017-04-02 ENCOUNTER — Ambulatory Visit: Payer: Medicare Other | Admitting: Internal Medicine

## 2017-04-03 ENCOUNTER — Ambulatory Visit (HOSPITAL_COMMUNITY): Payer: Medicare Other | Admitting: Certified Registered"

## 2017-04-03 ENCOUNTER — Encounter (HOSPITAL_COMMUNITY)
Admission: RE | Disposition: A | Discharge status: Home / Self Care | Payer: Self-pay | Source: Ambulatory Visit | Attending: Orthopedic Surgery

## 2017-04-03 ENCOUNTER — Encounter (HOSPITAL_COMMUNITY): Payer: Self-pay | Admitting: Certified Registered"

## 2017-04-03 ENCOUNTER — Other Ambulatory Visit: Payer: Self-pay

## 2017-04-03 ENCOUNTER — Observation Stay (HOSPITAL_COMMUNITY)
Admission: RE | Admit: 2017-04-03 | Discharge: 2017-04-04 | Disposition: A | Discharge status: Home / Self Care | Payer: Medicare Other | Source: Ambulatory Visit | Attending: Orthopedic Surgery | Admitting: Orthopedic Surgery

## 2017-04-03 DIAGNOSIS — M25762 Osteophyte, left knee: Secondary | ICD-10-CM | POA: Diagnosis not present

## 2017-04-03 DIAGNOSIS — M25562 Pain in left knee: Secondary | ICD-10-CM | POA: Diagnosis present

## 2017-04-03 DIAGNOSIS — Z87891 Personal history of nicotine dependence: Secondary | ICD-10-CM | POA: Insufficient documentation

## 2017-04-03 DIAGNOSIS — M1712 Unilateral primary osteoarthritis, left knee: Principal | ICD-10-CM | POA: Insufficient documentation

## 2017-04-03 DIAGNOSIS — Z79899 Other long term (current) drug therapy: Secondary | ICD-10-CM | POA: Diagnosis not present

## 2017-04-03 DIAGNOSIS — M1710 Unilateral primary osteoarthritis, unspecified knee: Secondary | ICD-10-CM

## 2017-04-03 DIAGNOSIS — E7800 Pure hypercholesterolemia, unspecified: Secondary | ICD-10-CM | POA: Diagnosis not present

## 2017-04-03 DIAGNOSIS — K219 Gastro-esophageal reflux disease without esophagitis: Secondary | ICD-10-CM | POA: Insufficient documentation

## 2017-04-03 DIAGNOSIS — Z7982 Long term (current) use of aspirin: Secondary | ICD-10-CM | POA: Insufficient documentation

## 2017-04-03 DIAGNOSIS — G8918 Other acute postprocedural pain: Secondary | ICD-10-CM | POA: Diagnosis not present

## 2017-04-03 DIAGNOSIS — I341 Nonrheumatic mitral (valve) prolapse: Secondary | ICD-10-CM | POA: Diagnosis not present

## 2017-04-03 DIAGNOSIS — E785 Hyperlipidemia, unspecified: Secondary | ICD-10-CM | POA: Diagnosis not present

## 2017-04-03 DIAGNOSIS — M179 Osteoarthritis of knee, unspecified: Secondary | ICD-10-CM

## 2017-04-03 DIAGNOSIS — I10 Essential (primary) hypertension: Secondary | ICD-10-CM | POA: Diagnosis not present

## 2017-04-03 HISTORY — PX: PARTIAL KNEE ARTHROPLASTY: SHX2174

## 2017-04-03 LAB — TYPE AND SCREEN
ABO/RH(D): O POS
Antibody Screen: NEGATIVE

## 2017-04-03 SURGERY — ARTHROPLASTY, KNEE, UNICOMPARTMENTAL
Anesthesia: Spinal | Site: Knee | Laterality: Left

## 2017-04-03 MED ORDER — BUPIVACAINE IN DEXTROSE 0.75-8.25 % IT SOLN
INTRATHECAL | Status: DC | PRN
  Administered 2017-04-03: 15 mg via INTRATHECAL

## 2017-04-03 MED ORDER — SODIUM CHLORIDE 0.9 % IV SOLN
INTRAVENOUS | Status: DC
  Administered 2017-04-04: 05:00:00 via INTRAVENOUS

## 2017-04-03 MED ORDER — LIDOCAINE 2% (20 MG/ML) 5 ML SYRINGE
INTRAMUSCULAR | Status: AC
  Filled 2017-04-03: qty 5

## 2017-04-03 MED ORDER — OXYCODONE HCL 5 MG PO TABS: 10.0000 mg | ORAL_TABLET | ORAL | Status: DC | PRN

## 2017-04-03 MED ORDER — FLEET ENEMA 7-19 GM/118ML RE ENEM: 1.0000 | ENEMA | Freq: Once | RECTAL | Status: DC | PRN

## 2017-04-03 MED ORDER — BISOPROLOL FUMARATE 5 MG PO TABS
5.0000 mg | ORAL_TABLET | Freq: Once | ORAL | Status: AC
  Administered 2017-04-03: 5 mg via ORAL
  Filled 2017-04-03 (×2): qty 1

## 2017-04-03 MED ORDER — CHLORHEXIDINE GLUCONATE 4 % EX LIQD: 60.0000 mL | Freq: Once | CUTANEOUS | Status: DC

## 2017-04-03 MED ORDER — DEXAMETHASONE SODIUM PHOSPHATE 10 MG/ML IJ SOLN
10.0000 mg | Freq: Once | INTRAMUSCULAR | Status: AC
  Administered 2017-04-04: 10 mg via INTRAVENOUS
  Filled 2017-04-03: qty 1

## 2017-04-03 MED ORDER — DEXAMETHASONE SODIUM PHOSPHATE 10 MG/ML IJ SOLN
10.0000 mg | Freq: Once | INTRAMUSCULAR | Status: AC
  Administered 2017-04-03: 10 mg via INTRAVENOUS

## 2017-04-03 MED ORDER — LIDOCAINE 2% (20 MG/ML) 5 ML SYRINGE
INTRAMUSCULAR | Status: DC | PRN
  Administered 2017-04-03: 100 mg via INTRAVENOUS

## 2017-04-03 MED ORDER — PANTOPRAZOLE SODIUM 40 MG PO TBEC
40.0000 mg | DELAYED_RELEASE_TABLET | Freq: Every day | ORAL | Status: DC
  Administered 2017-04-04: 10:00:00 40 mg via ORAL
  Filled 2017-04-03: qty 1

## 2017-04-03 MED ORDER — ACETAMINOPHEN 500 MG PO TABS
1000.0000 mg | ORAL_TABLET | Freq: Four times a day (QID) | ORAL | Status: DC
  Administered 2017-04-03 – 2017-04-04 (×3): 1000 mg via ORAL
  Filled 2017-04-03 (×3): qty 2

## 2017-04-03 MED ORDER — MIDAZOLAM HCL 2 MG/2ML IJ SOLN
INTRAMUSCULAR | Status: AC
  Filled 2017-04-03: qty 2

## 2017-04-03 MED ORDER — METOCLOPRAMIDE HCL 5 MG/ML IJ SOLN
5.0000 mg | Freq: Three times a day (TID) | INTRAMUSCULAR | Status: DC | PRN
Start: 2017-04-03 — End: 2017-04-04

## 2017-04-03 MED ORDER — PROPOFOL 10 MG/ML IV BOLUS
INTRAVENOUS | Status: AC
  Filled 2017-04-03: qty 40

## 2017-04-03 MED ORDER — FENTANYL CITRATE (PF) 100 MCG/2ML IJ SOLN
100.0000 ug | Freq: Once | INTRAMUSCULAR | Status: AC
  Administered 2017-04-03: 100 ug via INTRAVENOUS

## 2017-04-03 MED ORDER — CEFAZOLIN SODIUM-DEXTROSE 2-4 GM/100ML-% IV SOLN
INTRAVENOUS | Status: AC
  Filled 2017-04-03: qty 100

## 2017-04-03 MED ORDER — ROPIVACAINE HCL 5 MG/ML IJ SOLN
INTRAMUSCULAR | Status: DC | PRN
  Administered 2017-04-03: 150 mg via PERINEURAL

## 2017-04-03 MED ORDER — ACETAMINOPHEN 10 MG/ML IV SOLN
1000.0000 mg | Freq: Once | INTRAVENOUS | Status: AC
  Administered 2017-04-03: 1000 mg via INTRAVENOUS

## 2017-04-03 MED ORDER — FENTANYL CITRATE (PF) 100 MCG/2ML IJ SOLN: 25.0000 ug | INTRAMUSCULAR | Status: DC | PRN

## 2017-04-03 MED ORDER — PRAVASTATIN SODIUM 20 MG PO TABS: 20.0000 mg | ORAL_TABLET | Freq: Every evening | ORAL | Status: DC

## 2017-04-03 MED ORDER — ONDANSETRON HCL 4 MG/2ML IJ SOLN: 4.0000 mg | Freq: Four times a day (QID) | INTRAMUSCULAR | Status: DC | PRN

## 2017-04-03 MED ORDER — BUPIVACAINE LIPOSOME 1.3 % IJ SUSP
20.0000 mL | Freq: Once | INTRAMUSCULAR | Status: DC
  Filled 2017-04-03: qty 20

## 2017-04-03 MED ORDER — SODIUM CHLORIDE 0.9 % IJ SOLN
INTRAMUSCULAR | Status: AC
  Filled 2017-04-03: qty 50

## 2017-04-03 MED ORDER — OXYCODONE HCL 5 MG PO TABS
5.0000 mg | ORAL_TABLET | ORAL | Status: DC | PRN
  Administered 2017-04-04: 5 mg via ORAL
  Filled 2017-04-03: qty 1

## 2017-04-03 MED ORDER — FENTANYL CITRATE (PF) 100 MCG/2ML IJ SOLN
INTRAMUSCULAR | Status: AC
  Filled 2017-04-03: qty 2

## 2017-04-03 MED ORDER — CEFAZOLIN SODIUM-DEXTROSE 2-4 GM/100ML-% IV SOLN
2.0000 g | Freq: Four times a day (QID) | INTRAVENOUS | Status: AC
  Administered 2017-04-03 – 2017-04-04 (×2): 2 g via INTRAVENOUS
  Filled 2017-04-03 (×2): qty 100

## 2017-04-03 MED ORDER — BUPIVACAINE HCL (PF) 0.25 % IJ SOLN
INTRAMUSCULAR | Status: DC | PRN
  Administered 2017-04-03: 30 mL

## 2017-04-03 MED ORDER — MENTHOL 3 MG MT LOZG: 1.0000 | LOZENGE | OROMUCOSAL | Status: DC | PRN

## 2017-04-03 MED ORDER — BISOPROLOL-HYDROCHLOROTHIAZIDE 5-6.25 MG PO TABS
1.0000 | ORAL_TABLET | Freq: Every day | ORAL | Status: DC
  Administered 2017-04-04: 1 via ORAL
  Filled 2017-04-03: qty 1

## 2017-04-03 MED ORDER — LIP MEDEX EX OINT
TOPICAL_OINTMENT | CUTANEOUS | Status: AC
  Filled 2017-04-03: qty 7

## 2017-04-03 MED ORDER — FLUTICASONE PROPIONATE 50 MCG/ACT NA SUSP
2.0000 | Freq: Every day | NASAL | Status: DC | PRN
  Filled 2017-04-03: qty 16

## 2017-04-03 MED ORDER — PROPOFOL 10 MG/ML IV BOLUS
INTRAVENOUS | Status: AC
  Filled 2017-04-03: qty 20

## 2017-04-03 MED ORDER — ONDANSETRON HCL 4 MG/2ML IJ SOLN
INTRAMUSCULAR | Status: AC
  Filled 2017-04-03: qty 2

## 2017-04-03 MED ORDER — DOCUSATE SODIUM 100 MG PO CAPS
100.0000 mg | ORAL_CAPSULE | Freq: Two times a day (BID) | ORAL | Status: DC
  Administered 2017-04-03 – 2017-04-04 (×2): 100 mg via ORAL
  Filled 2017-04-03 (×2): qty 1

## 2017-04-03 MED ORDER — METHOCARBAMOL 1000 MG/10ML IJ SOLN
500.0000 mg | Freq: Four times a day (QID) | INTRAMUSCULAR | Status: DC | PRN
  Administered 2017-04-04: 03:00:00 500 mg via INTRAVENOUS
  Filled 2017-04-03: qty 550

## 2017-04-03 MED ORDER — MORPHINE SULFATE (PF) 4 MG/ML IV SOLN: 1.0000 mg | INTRAVENOUS | Status: DC | PRN

## 2017-04-03 MED ORDER — PHENOL 1.4 % MT LIQD
1.0000 | OROMUCOSAL | Status: DC | PRN
  Filled 2017-04-03: qty 177

## 2017-04-03 MED ORDER — ACETAMINOPHEN 10 MG/ML IV SOLN
INTRAVENOUS | Status: AC
  Filled 2017-04-03: qty 100

## 2017-04-03 MED ORDER — METOCLOPRAMIDE HCL 5 MG PO TABS: 5.0000 mg | ORAL_TABLET | Freq: Three times a day (TID) | ORAL | Status: DC | PRN

## 2017-04-03 MED ORDER — SODIUM CHLORIDE 0.9 % IJ SOLN
INTRAMUSCULAR | Status: DC | PRN
  Administered 2017-04-03: 30 mL

## 2017-04-03 MED ORDER — FENTANYL CITRATE (PF) 100 MCG/2ML IJ SOLN
INTRAMUSCULAR | Status: AC
  Administered 2017-04-03: 100 ug via INTRAVENOUS
  Filled 2017-04-03: qty 2

## 2017-04-03 MED ORDER — PHENYLEPHRINE HCL 10 MG/ML IJ SOLN
INTRAMUSCULAR | Status: DC | PRN
  Administered 2017-04-03: 80 ug via INTRAVENOUS

## 2017-04-03 MED ORDER — PROPOFOL 500 MG/50ML IV EMUL
INTRAVENOUS | Status: DC | PRN
  Administered 2017-04-03: 40 ug/kg/min via INTRAVENOUS

## 2017-04-03 MED ORDER — CEFAZOLIN SODIUM-DEXTROSE 2-4 GM/100ML-% IV SOLN
2.0000 g | INTRAVENOUS | Status: AC
  Administered 2017-04-03: 2 g via INTRAVENOUS

## 2017-04-03 MED ORDER — BUPIVACAINE LIPOSOME 1.3 % IJ SUSP
INTRAMUSCULAR | Status: DC | PRN
  Administered 2017-04-03: 20 mL

## 2017-04-03 MED ORDER — PHENYLEPHRINE 40 MCG/ML (10ML) SYRINGE FOR IV PUSH (FOR BLOOD PRESSURE SUPPORT)
PREFILLED_SYRINGE | INTRAVENOUS | Status: AC
  Filled 2017-04-03: qty 10

## 2017-04-03 MED ORDER — ONDANSETRON HCL 4 MG PO TABS: 4.0000 mg | ORAL_TABLET | Freq: Four times a day (QID) | ORAL | Status: DC | PRN

## 2017-04-03 MED ORDER — DEXAMETHASONE SODIUM PHOSPHATE 10 MG/ML IJ SOLN
INTRAMUSCULAR | Status: AC
  Filled 2017-04-03: qty 1

## 2017-04-03 MED ORDER — PROMETHAZINE HCL 25 MG/ML IJ SOLN: 6.2500 mg | INTRAMUSCULAR | Status: DC | PRN

## 2017-04-03 MED ORDER — 0.9 % SODIUM CHLORIDE (POUR BTL) OPTIME
TOPICAL | Status: DC | PRN
  Administered 2017-04-03: 1000 mL

## 2017-04-03 MED ORDER — DIPHENHYDRAMINE HCL 12.5 MG/5ML PO ELIX: 12.5000 mg | ORAL_SOLUTION | ORAL | Status: DC | PRN

## 2017-04-03 MED ORDER — TRANEXAMIC ACID 1000 MG/10ML IV SOLN
1000.0000 mg | INTRAVENOUS | Status: AC
  Administered 2017-04-03: 1000 mg via INTRAVENOUS
  Filled 2017-04-03: qty 1100

## 2017-04-03 MED ORDER — POLYETHYLENE GLYCOL 3350 17 G PO PACK: 17.0000 g | PACK | Freq: Every day | ORAL | Status: DC | PRN

## 2017-04-03 MED ORDER — MIDAZOLAM HCL 2 MG/2ML IJ SOLN
INTRAMUSCULAR | Status: AC
  Administered 2017-04-03: 2 mg via INTRAVENOUS
  Filled 2017-04-03: qty 2

## 2017-04-03 MED ORDER — ACETAMINOPHEN 325 MG PO TABS: 650.0000 mg | ORAL_TABLET | ORAL | Status: DC | PRN

## 2017-04-03 MED ORDER — ONDANSETRON HCL 4 MG/2ML IJ SOLN
INTRAMUSCULAR | Status: DC | PRN
  Administered 2017-04-03: 4 mg via INTRAVENOUS

## 2017-04-03 MED ORDER — SODIUM CHLORIDE 0.9 % IR SOLN
Status: DC | PRN
  Administered 2017-04-03: 1000 mL

## 2017-04-03 MED ORDER — RIVAROXABAN 10 MG PO TABS
10.0000 mg | ORAL_TABLET | Freq: Every day | ORAL | Status: DC
  Administered 2017-04-04: 10:00:00 10 mg via ORAL
  Filled 2017-04-03: qty 1

## 2017-04-03 MED ORDER — MIDAZOLAM HCL 2 MG/2ML IJ SOLN
2.0000 mg | Freq: Once | INTRAMUSCULAR | Status: AC
  Administered 2017-04-03: 2 mg via INTRAVENOUS

## 2017-04-03 MED ORDER — METHOCARBAMOL 500 MG PO TABS
500.0000 mg | ORAL_TABLET | Freq: Four times a day (QID) | ORAL | Status: DC | PRN
  Administered 2017-04-04: 500 mg via ORAL
  Filled 2017-04-03: qty 1

## 2017-04-03 MED ORDER — STERILE WATER FOR IRRIGATION IR SOLN
Status: DC | PRN
  Administered 2017-04-03: 2000 mL

## 2017-04-03 MED ORDER — BISACODYL 10 MG RE SUPP
10.0000 mg | Freq: Every day | RECTAL | Status: DC | PRN
Start: 2017-04-03 — End: 2017-04-04

## 2017-04-03 MED ORDER — SODIUM CHLORIDE 0.9 % IJ SOLN
INTRAMUSCULAR | Status: AC
  Filled 2017-04-03: qty 10

## 2017-04-03 MED ORDER — ACETAMINOPHEN 650 MG RE SUPP: 650.0000 mg | RECTAL | Status: DC | PRN

## 2017-04-03 MED ORDER — LACTATED RINGERS IV SOLN
INTRAVENOUS | Status: DC
  Administered 2017-04-03 (×3): via INTRAVENOUS

## 2017-04-03 SURGICAL SUPPLY — 52 items
BAG ZIPLOCK 12X15 (MISCELLANEOUS) ×3 IMPLANT
BANDAGE ACE 6X5 VEL STRL LF (GAUZE/BANDAGES/DRESSINGS) ×3 IMPLANT
BLADE SAW RECIPROCATING 77.5 (BLADE) ×3 IMPLANT
BLADE SAW SGTL 13.0X1.19X90.0M (BLADE) ×3 IMPLANT
BOWL SMART MIX CTS (DISPOSABLE) ×3 IMPLANT
BUR OVAL CARBIDE 4.0 (BURR) ×3 IMPLANT
CEMENT HV SMART SET (Cement) ×3 IMPLANT
CLOSURE STERI-STRIP 1/4X4 (GAUZE/BANDAGES/DRESSINGS) ×3 IMPLANT
CLOSURE WOUND 1/2 X4 (GAUZE/BANDAGES/DRESSINGS) ×2
CLOTH BEACON ORANGE TIMEOUT ST (SAFETY) ×3 IMPLANT
COMP FEMUR SZ2 IBAL LT MED (Knees) ×3 IMPLANT
COMPONENT FEMUR SZ2 IBALLT MED (Knees) ×1 IMPLANT
COVER SURGICAL LIGHT HANDLE (MISCELLANEOUS) ×3 IMPLANT
CUFF TOURN SGL QUICK 34 (TOURNIQUET CUFF) ×2
CUFF TRNQT CYL 34X4X40X1 (TOURNIQUET CUFF) ×1 IMPLANT
DRSG ADAPTIC 3X8 NADH LF (GAUZE/BANDAGES/DRESSINGS) ×3 IMPLANT
DRSG PAD ABDOMINAL 8X10 ST (GAUZE/BANDAGES/DRESSINGS) ×3 IMPLANT
DURAPREP 26ML APPLICATOR (WOUND CARE) ×3 IMPLANT
ELECT REM PT RETURN 15FT ADLT (MISCELLANEOUS) ×3 IMPLANT
EVACUATOR 1/8 PVC DRAIN (DRAIN) ×3 IMPLANT
GAUZE SPONGE 4X4 12PLY STRL (GAUZE/BANDAGES/DRESSINGS) ×3 IMPLANT
GAUZE SPONGE 4X4 16PLY XRAY LF (GAUZE/BANDAGES/DRESSINGS) ×3 IMPLANT
GLOVE BIO SURGEON STRL SZ7.5 (GLOVE) ×3 IMPLANT
GLOVE BIO SURGEON STRL SZ8 (GLOVE) ×3 IMPLANT
GLOVE BIOGEL PI IND STRL 7.5 (GLOVE) ×3 IMPLANT
GLOVE BIOGEL PI IND STRL 8 (GLOVE) ×2 IMPLANT
GLOVE BIOGEL PI INDICATOR 7.5 (GLOVE) ×6
GLOVE BIOGEL PI INDICATOR 8 (GLOVE) ×4
GLOVE SURG SS PI 7.0 STRL IVOR (GLOVE) ×3 IMPLANT
GOWN STRL REUS W/TWL LRG LVL3 (GOWN DISPOSABLE) ×3 IMPLANT
GOWN STRL REUS W/TWL XL LVL3 (GOWN DISPOSABLE) ×6 IMPLANT
HANDPIECE INTERPULSE COAX TIP (DISPOSABLE) ×2
IMMOBILIZER KNEE 20 (SOFTGOODS) ×3
IMMOBILIZER KNEE 20 THIGH 36 (SOFTGOODS) ×1 IMPLANT
MANIFOLD NEPTUNE II (INSTRUMENTS) ×3 IMPLANT
PACK TOTAL KNEE CUSTOM (KITS) ×3 IMPLANT
PAD ABD 8X10 STRL (GAUZE/BANDAGES/DRESSINGS) ×3 IMPLANT
PADDING CAST COTTON 6X4 STRL (CAST SUPPLIES) ×6 IMPLANT
POSITIONER SURGICAL ARM (MISCELLANEOUS) ×3 IMPLANT
SET HNDPC FAN SPRY TIP SCT (DISPOSABLE) ×1 IMPLANT
STRIP CLOSURE SKIN 1/2X4 (GAUZE/BANDAGES/DRESSINGS) ×4 IMPLANT
SUT MNCRL AB 4-0 PS2 18 (SUTURE) ×3 IMPLANT
SUT STRATAFIX 0 PDS 27 VIOLET (SUTURE) ×3
SUT VIC AB 2-0 CT1 27 (SUTURE) ×4
SUT VIC AB 2-0 CT1 TAPERPNT 27 (SUTURE) ×2 IMPLANT
SUTURE STRATFX 0 PDS 27 VIOLET (SUTURE) ×1 IMPLANT
SYR 50ML LL SCALE MARK (SYRINGE) ×3 IMPLANT
TIBIAL TRAY IBAL UKA SZ2 LT ME (Knees) ×2 IMPLANT
TRAY TIBIAL IBAL UKA SZ2 LT ME (Knees) ×1 IMPLANT
WRAP KNEE MAXI GEL POST OP (GAUZE/BANDAGES/DRESSINGS) ×3 IMPLANT
tibial bearing implant size 2 (9 mm thickness) iBa ×2 IMPLANT
tibial bearing implant size 2 (9mm thickness) iBal ×3 IMPLANT

## 2017-04-03 NOTE — Anesthesia Preprocedure Evaluation (Signed)
Anesthesia Evaluation  Patient identified by MRN, date of birth, ID band Patient awake    Reviewed: Allergy & Precautions, NPO status , Patient's Chart, lab work & pertinent test results  History of Anesthesia Complications Negative for: history of anesthetic complications  Airway Mallampati: II  TM Distance: >3 FB Neck ROM: Full    Dental no notable dental hx. (+) Dental Advisory Given   Pulmonary neg pulmonary ROS, former smoker,    Pulmonary exam normal        Cardiovascular hypertension, negative cardio ROS Normal cardiovascular exam     Neuro/Psych negative neurological ROS  negative psych ROS   GI/Hepatic Neg liver ROS, GERD  Controlled,  Endo/Other  negative endocrine ROS  Renal/GU negative Renal ROS     Musculoskeletal negative musculoskeletal ROS (+)   Abdominal   Peds  Hematology negative hematology ROS (+)   Anesthesia Other Findings Day of surgery medications reviewed with the patient.  Reproductive/Obstetrics                             Anesthesia Physical Anesthesia Plan  ASA: II  Anesthesia Plan: Spinal   Post-op Pain Management:  [facility] for Post-op pain   Induction:   PONV Risk Score and Plan: 2 and Ondansetron and Propofol infusion  Airway Management Planned: Natural Airway and Simple Face Mask  Additional Equipment:   Intra-op Plan:   Post-operative Plan:   Informed Consent: I have reviewed the patients History and Physical, chart, labs and discussed the procedure including the risks, benefits and alternatives for the proposed anesthesia with the patient or authorized representative who has indicated his/her understanding and acceptance.   Dental advisory given  Plan Discussed with: CRNA, Anesthesiologist and Surgeon  Anesthesia Plan Comments:         Anesthesia Quick Evaluation

## 2017-04-03 NOTE — Discharge Instructions (Addendum)
Dr. Gaynelle Arabian Total Joint Specialist [facility] [address], [place], [place] [address] [phone]  UNI KNEE REPLACEMENT POSTOPERATIVE DIRECTIONS   Knee Rehabilitation, Guidelines Following Surgery  Results after knee surgery are often greatly improved when you follow the exercise, range of motion and muscle strengthening exercises prescribed by your doctor. Safety measures are also important to protect the knee from further injury. Any time any of these exercises cause you to have increased pain or swelling in your knee joint, decrease the amount until you are comfortable again and slowly increase them. If you have problems or questions, call your caregiver or physical therapist for advice.   HOME CARE INSTRUCTIONS  Remove items at home which could result in a fall. This includes throw rugs or furniture in walking pathways.   ICE to the affected knee every three hours for 30 minutes at a time and then as needed for pain and swelling.  Continue to use ice on the knee for pain and swelling from surgery. You may notice swelling that will progress down to the foot and ankle.  This is normal after surgery.  Elevate the leg when you are not up walking on it.    Continue to use the breathing machine which will help keep your temperature down.  It is common for your temperature to cycle up and down following surgery, especially at night when you are not up moving around and exerting yourself.  The breathing machine keeps your lungs expanded and your temperature down.  Do not place pillow under knee, focus on keeping the knee straight while resting  DIET You may resume your previous home diet once your are discharged from the hospital.  DRESSING / WOUND CARE / SHOWERING You may shower 3 days after surgery, but keep the wounds dry during showering.  You may use an occlusive plastic wrap (Press'n Seal for example), NO SOAKING/SUBMERGING IN THE BATHTUB.  If the  bandage gets wet, change with a clean dry gauze.  If the incision gets wet, pat the wound dry with a clean towel. Leave the surgical dressing on the knee for 48 hours.  May remove the dressing on the second day.  Remove the Ace Wrap along with the cotton padding.  Leave the steri-strip bandaids in place along the incision on the skin.  Cover the incision each day with some dry gauze and paper tape. You may start showering once you are discharged home but do not submerge the incision under water. Just pat the incision dry and apply a dry gauze dressing on daily. Change the surgical dressing daily and reapply a dry dressing each time.  ACTIVITY Walk with your walker as instructed. Use walker as long as suggested by your caregivers. Avoid periods of inactivity such as sitting longer than an hour when not asleep. This helps prevent blood clots.  You may resume a sexual relationship in one month or when given the OK by your doctor.  You may return to work once you are cleared by your doctor.  Do not drive a car for 6 weeks or until released by you surgeon.  Do not drive while taking narcotics.  WEIGHT BEARING Weight bearing as tolerated with assist device (walker, cane, etc) as directed, use it as long as suggested by your surgeon or therapist, typically at least 4-6 weeks.  POSTOPERATIVE CONSTIPATION PROTOCOL Constipation - defined medically as fewer than three stools per week and severe constipation as less than one stool per week.  One of the most common issues patients have following surgery is constipation.  Even if you have a regular bowel pattern at home, your normal regimen is likely to be disrupted due to multiple reasons following surgery.  Combination of anesthesia, postoperative narcotics, change in appetite and fluid intake all can affect your bowels.  In order to avoid complications following surgery, here are some recommendations in order to help you during your recovery  period.  Colace (docusate) - Pick up an over-the-counter form of Colace or another stool softener and take twice a day as long as you are requiring postoperative pain medications.  Take with a full glass of water daily.  If you experience loose stools or diarrhea, hold the colace until you stool forms back up.  If your symptoms do not get better within 1 week or if they get worse, check with your doctor.  Dulcolax (bisacodyl) - Pick up over-the-counter and take as directed by the product packaging as needed to assist with the movement of your bowels.  Take with a full glass of water.  Use this product as needed if not relieved by Colace only.   MiraLax (polyethylene glycol) - Pick up over-the-counter to have on hand.  MiraLax is a solution that will increase the amount of water in your bowels to assist with bowel movements.  Take as directed and can mix with a glass of water, juice, soda, coffee, or tea.  Take if you go more than two days without a movement. Do not use MiraLax more than once per day. Call your doctor if you are still constipated or irregular after using this medication for 7 days in a row.  If you continue to have problems with postoperative constipation, please contact the office for further assistance and recommendations.  If you experience "the worst abdominal pain ever" or develop nausea or vomiting, please contact the office immediatly for further recommendations for treatment.  ITCHING  If you experience itching with your medications, try taking only a single pain pill, or even half a pain pill at a time.  You can also use Benadryl over the counter for itching or also to help with sleep.   TED HOSE STOCKINGS Wear the elastic stockings on both legs for three weeks following surgery during the day but you may remove then at night for sleeping.  MEDICATIONS See your medication summary on the After Visit Summary that the nursing staff will review with you prior to discharge.   You may have some home medications which will be placed on hold until you complete the course of blood thinner medication.  It is important for you to complete the blood thinner medication as prescribed by your surgeon.  Continue your approved medications as instructed at time of discharge.  PRECAUTIONS If you experience chest pain or shortness of breath - call 911 immediately for transfer to the hospital emergency department.  If you develop a fever greater that 101 F, purulent drainage from wound, increased redness or drainage from wound, foul odor from the wound/dressing, or calf pain - CONTACT YOUR SURGEON.                                                   FOLLOW-UP APPOINTMENTS Make sure you keep all of your appointments after your operation with your surgeon and caregivers. You should call  the office at the above phone number and make an appointment for approximately two weeks after the date of your surgery or on the date instructed by your surgeon outlined in the "After Visit Summary".  RANGE OF MOTION AND STRENGTHENING EXERCISES  Rehabilitation of the knee is important following a knee injury or an operation. After just a few days of immobilization, the muscles of the thigh which control the knee become weakened and shrink (atrophy). Knee exercises are designed to build up the tone and strength of the thigh muscles and to improve knee motion. Often times heat used for twenty to thirty minutes before working out will loosen up your tissues and help with improving the range of motion but do not use heat for the first two weeks following surgery. These exercises can be done on a training (exercise) mat, on the floor, on a table or on a bed. Use what ever works the best and is most comfortable for you Knee exercises include:  Leg Lifts - While your knee is still immobilized in a splint or cast, you can do straight leg raises. Lift the leg to 60 degrees, hold for 3 sec, and slowly lower the leg. Repeat  10-20 times 2-3 times daily. Perform this exercise against resistance later as your knee gets better.  Quad and Hamstring Sets - Tighten up the muscle on the front of the thigh (Quad) and hold for 5-10 sec. Repeat this 10-20 times hourly. Hamstring sets are done by pushing the foot backward against an object and holding for 5-10 sec. Repeat as with quad sets.   Leg Slides: Lying on your back, slowly slide your foot toward your buttocks, bending your knee up off the floor (only go as far as is comfortable). Then slowly slide your foot back down until your leg is flat on the floor again.  Angel Wings: Lying on your back spread your legs to the side as far apart as you can without causing discomfort.  A rehabilitation program following serious knee injuries can speed recovery and prevent re-injury in the future due to weakened muscles. Contact your doctor or a physical therapist for more information on knee rehabilitation.   IF YOU ARE TRANSFERRED TO A SKILLED REHAB FACILITY If the patient is transferred to a skilled rehab facility following release from the hospital, a list of the current medications will be sent to the facility for the patient to continue.  When discharged from the skilled rehab facility, please have the facility set up the patient's [place] prior to being released. Also, the skilled facility will be responsible for providing the patient with their medications at time of release from the facility to include their pain medication, the muscle relaxants, and their blood thinner medication. If the patient is still at the rehab facility at time of the two week follow up appointment, the skilled rehab facility will also need to assist the patient in arranging follow up appointment in our office and any transportation needs.  MAKE SURE YOU:  Understand these instructions.  Get help right away if you are not doing well or get worse.    Pick up stool softner and laxative  for home use following surgery while on pain medications. Do not submerge incision under water. Please use good hand washing techniques while changing dressing each day. May shower starting three days after surgery. Please use a clean towel to pat the incision dry following showers. Continue to use ice for pain and swelling  after surgery. Do not use any lotions or creams on the incision until instructed by your surgeon.  Xarelto 10 mg daily for ten days, then change to Aspirin 325 mg daily for two weeks, then reduce back to then home dose of Baby Aspirin 81 mg daily.   Information on my medicine - XARELTO (Rivaroxaban)   Why was Xarelto prescribed for you? Xarelto was prescribed for you to reduce the risk of blood clots forming after orthopedic surgery. The medical term for these abnormal blood clots is venous thromboembolism (VTE).  What do you need to know about xarelto ? Take your Xarelto ONCE DAILY at the same time every day. You may take it either with or without food.  If you have difficulty swallowing the tablet whole, you may crush it and mix in applesauce just prior to taking your dose.  Take Xarelto exactly as prescribed by your doctor and DO NOT stop taking Xarelto without talking to the doctor who prescribed the medication.  Stopping without other VTE prevention medication to take the place of Xarelto may increase your risk of developing a clot.  After discharge, you should have regular check-up appointments with your healthcare provider that is prescribing your Xarelto.    What do you do if you miss a dose? If you miss a dose, take it as soon as you remember on the same day then continue your regularly scheduled once daily regimen the next day. Do not take two doses of Xarelto on the same day.   Important Safety Information A possible side effect of Xarelto is bleeding. You should call your healthcare provider right away if you experience any of the  following: ? Bleeding from an injury or your nose that does not stop. ? Unusual colored urine (red or dark brown) or unusual colored stools (red or black). ? Unusual bruising for unknown reasons. ? A serious fall or if you hit your head (even if there is no bleeding).  Some medicines may interact with Xarelto and might increase your risk of bleeding while on Xarelto. To help avoid this, consult your healthcare provider or pharmacist prior to using any new prescription or non-prescription medications, including herbals, vitamins, non-steroidal anti-inflammatory drugs (NSAIDs) and supplements.  This website has more information on Xarelto: [email or web address]

## 2017-04-03 NOTE — Anesthesia Procedure Notes (Signed)
Anesthesia Regional Block: Adductor canal block   Pre-Anesthetic Checklist: ,, timeout performed, Correct Patient, Correct Site, Correct Laterality, Correct Procedure, Correct Position, site marked, Risks and benefits discussed,  Surgical consent,  Pre-op evaluation,  At surgeon's request and post-op pain management  Laterality: Left  Prep: chloraprep       Needles:  Injection technique: Single-shot  Needle Type: Stimulator Needle - 80     Needle Length: 10cm  Needle Gauge: 21     Additional Needles:   Narrative:  Start time: 04/03/2017 3:22 PM End time: 04/03/2017 3:32 PM Injection made incrementally with aspirations every 5 mL.  Performed by: Personally

## 2017-04-03 NOTE — Anesthesia Postprocedure Evaluation (Signed)
Anesthesia Post Note  Patient: Kim Fuller  Procedure(s) Performed: Left knee medial unicompartmental arthroplasty (Left Knee)     Patient location during evaluation: PACU Anesthesia Type: Spinal Level of consciousness: awake and alert Pain management: pain level controlled Vital Signs Assessment: post-procedure vital signs reviewed and stable Respiratory status: spontaneous breathing and respiratory function stable Cardiovascular status: blood pressure returned to baseline and stable Postop Assessment: spinal receding Anesthetic complications: no    Last Vitals:  Vitals:   04/03/17 1900 04/03/17 1915  BP: (!) 106/91 105/75  Pulse: (!) 51 (!) 56  Resp: 12 19  Temp:  36.4 C  SpO2: 95% 98%    Last Pain:  Vitals:   04/03/17 1830  TempSrc:   PainSc: 0-No pain    LLE Motor Response: Purposeful movement (04/03/17 1915) LLE Sensation: Numbness;Tingling (04/03/17 1915) RLE Motor Response: Purposeful movement (04/03/17 1915) RLE Sensation: Numbness;Tingling (04/03/17 1915) L Sensory Level: L4-Anterior knee, lower leg (04/03/17 1915) R Sensory Level: S1-Sole of foot, small toes (04/03/17 1915)  Kim Fuller

## 2017-04-03 NOTE — Op Note (Signed)
OPERATIVE REPORT-UNICOMPARTMENTAL ARTHROPLASTY  PREOPERATIVE DIAGNOSIS: Medial compartment osteoarthritis, Left knee  POSTOPERATIVE DIAGNOSIS: Medial compartment osteoarthritis, Left knee  PROCEDURE:Left knee medial unicompartmental arthroplasty. (Arthrex)  SURGEON: Gaynelle Arabian, MD   ASSISTANT: Arlee Muslim, PA-C  ANESTHESIA:  Adductor canal block and spinal.   ESTIMATED BLOOD LOSS: Minimal.   DRAINS: Hemovac x1.   TOURNIQUET TIME:   Total Tourniquet Time Documented: Thigh (Left) - 40 minutes Total: Thigh (Left) - 40 minutes    COMPLICATIONS: None.   CONDITION: Stable to recovery.   BRIEF CLINICAL NOTE:Kim Fuller is a 71 y.o. female, who has  significant isolated medial compartment arthritis of the Left knee. The patient has had nonoperative management including injections of cortisone and viscous supplements. Unfortunately, the pain persists.  Radiograph showed isolated medial compartment bone-on-bone arthritis  with normal-appearing patellofemoral and lateral compartments. The patient presents now for left knee unicompartmental arthroplasty.   PROCEDURE IN DETAIL: After successful administration of  Adductor canal block and spinal anesthetic, a tourniquet was placed high on the  Left thigh and the Left lower extremity prepped and draped in usual sterile fashion. Extremity was wrapped in an Esmarch, knee flexed, and tourniquet inflated to 300 mmHg.       A midline incision was made with a 10 blade through subcutaneous  tissue to the extensor mechanism. A fresh blade was used to make a  medial parapatellar arthrotomy. Soft tissue on the proximal medial  tibia subperiosteally elevated to the joint line with a knife and into  the semimembranosus bursa with a Cobb elevator. The patella was  subluxed laterally, and the knee flexed 90 degrees. The ACL was intact.  The marginal osteophytes on the medial femur and tibia were removed with  a rongeur. The medial  meniscus was also removed. The extramedullary tibial cutting guide was placed referencing Proximally at the medial aspect of the tibial tubercle and distally along the 2nd metatarsal axis. 6 degrees of posterior slope was dialed in and the block was pinned to remove 4 mm from the medial tibial surface.The cut is made with an oscillating saw and the cut bone removed.      The 8 mm spacer was then placed with the knee in flexion and needed to go up to 9 mm for a stable fit in flexion and extension. The distal femoral cutting guide was attached to the spacer in extension and pinned to make the distal femur cut with an oscillating saw.  The posterior cutting block is placed onto the spacer in flexion and pinned. The posterior cut is made with an oscillating saw  And bone removed. The trial size 2 is placed and is most appropriate. The femoral preparation is completed with the chamfer cut and drilling of the 2 lug holes. The trial size 2 femur is placed with excellent fit. The 9 mm spacer is placed and there is excellent balance through full range of motion.  The trial and the spacer are removed and tibia addressed.      The tibial sizer is placed and size 2 is most appropriate. The proximal tibia is prepared with the drill holes and keel for the size 2. The size 2 implant is placed with excellent fit. The trials are removed and cut bone surfaces prepared with pulsatile lavage. The cement is mixed and once ready for implantation The size 2 tibia and size 2 femur are cemented into place and all extruded cement removed. The 9 mm insert is then placed into the tibial tray  and locked into position. The knee is placed through a full range of motion with excellent stability.           I then injected the extensor mechanism, periosteum of  the femur and subcu tissues, a total of 20 mL of Exparel mixed with 30  mL of saline. Wound was copiously irrigated with saline solution, and the arthrotomy closed over a  Hemovac drain with a running #1 Stratofix  suture. The subcutaneous was closed with  interrupted 2-0 Vicryl and subcuticular running 4-0 Monocryl. The drain  was hooked to suction. Incision cleaned and dried and Steri-Strips and  a bulky sterile dressing applied. The tourniquet was released after a  total time of 40 minutes. This was done after closing the extensor  mechanism. The wound was closed and a bulky sterile dressing was  applied. The operative limb was placed into a knee immobilizer, and the patient awakened and transported to recovery room in stable condition.       Please note that a surgical assistant was a medical necessity for this  procedure in order to perform it in a safe and expeditious manner.  Assistance was necessary for retracting vital ligaments, neurovascular  structures, as well as for proper positioning of the limb to allow for  appropriate bone cuts and appropriate placement of the prosthesis.    [name] Hriday Stai, MD

## 2017-04-03 NOTE — Anesthesia Procedure Notes (Signed)
Spinal  Patient location during procedure: OR Start time: 04/03/2017 4:26 PM End time: 04/03/2017 4:36 PM Staffing Anesthesiologist: Duane Boston, MD Performed: anesthesiologist  Preanesthetic Checklist Completed: patient identified, surgical consent, pre-op evaluation, timeout performed, IV checked, risks and benefits discussed and monitors and equipment checked Spinal Block Patient position: sitting Prep: DuraPrep Patient monitoring: cardiac monitor, continuous pulse ox and blood pressure Approach: midline Location: L2-3 Injection technique: single-shot Needle Needle type: Pencan  Needle gauge: 24 G Needle length: 9 cm Additional Notes Functioning IV was confirmed and monitors were applied. Sterile prep and drape, including hand hygiene and sterile gloves were used. The patient was positioned and the spine was prepped. The skin was anesthetized with lidocaine.  Free flow of clear CSF was obtained prior to injecting local anesthetic into the CSF.  The spinal needle aspirated freely following injection.  The needle was carefully withdrawn.  The patient tolerated the procedure well.

## 2017-04-03 NOTE — Progress Notes (Signed)
Assisted Dr. Singer with left, ultrasound guided, adductor canal block. Side rails up, monitors on throughout procedure. See vital signs in flow sheet. Tolerated Procedure well.  

## 2017-04-03 NOTE — Interval H&P Note (Signed)
History and Physical Interval Note:  92/03/9416 4:08 PM  Kim Fuller  has presented today for surgery, with the diagnosis of Left knee medial compartment osteoarthritis   The various methods of treatment have been discussed with the patient and family. After consideration of risks, benefits and other options for treatment, the patient has consented to  Procedure(s): Left knee medial unicompartmental arthroplasty (Left) as a surgical intervention .  The patient's history has been reviewed, patient examined, no change in status, stable for surgery.  I have reviewed the patient's chart and labs.  Questions were answered to the patient's satisfaction.     Pilar Plate Georgeann Brinkman

## 2017-04-03 NOTE — Transfer of Care (Signed)
Immediate Anesthesia Transfer of Care Note  Patient: Kim Fuller  Procedure(s) Performed: Left knee medial unicompartmental arthroplasty (Left Knee)  Patient Location: PACU  Anesthesia Type:Spinal  Level of Consciousness: awake, alert , oriented and patient cooperative  Airway & Oxygen Therapy: Patient Spontanous Breathing and Patient connected to face mask oxygen  Post-op Assessment: Report given to RN and Post -op Vital signs reviewed and stable  Post vital signs: stable  Last Vitals:  Vitals:   04/03/17 1539 04/03/17 1544  BP: 121/61 (!) 115/56  Pulse: (!) 51 (!) 56  Resp: 14 14  Temp:    SpO2: 100% 100%    Last Pain:  Vitals:   04/03/17 1424  TempSrc: Oral         Complications: No apparent anesthesia complications

## 2017-04-04 ENCOUNTER — Encounter (HOSPITAL_COMMUNITY): Payer: Self-pay | Admitting: Orthopedic Surgery

## 2017-04-04 DIAGNOSIS — M1712 Unilateral primary osteoarthritis, left knee: Secondary | ICD-10-CM | POA: Diagnosis not present

## 2017-04-04 DIAGNOSIS — I341 Nonrheumatic mitral (valve) prolapse: Secondary | ICD-10-CM | POA: Diagnosis not present

## 2017-04-04 DIAGNOSIS — E785 Hyperlipidemia, unspecified: Secondary | ICD-10-CM | POA: Diagnosis not present

## 2017-04-04 DIAGNOSIS — Z87891 Personal history of nicotine dependence: Secondary | ICD-10-CM | POA: Diagnosis not present

## 2017-04-04 DIAGNOSIS — Z7982 Long term (current) use of aspirin: Secondary | ICD-10-CM | POA: Diagnosis not present

## 2017-04-04 DIAGNOSIS — M25762 Osteophyte, left knee: Secondary | ICD-10-CM | POA: Diagnosis not present

## 2017-04-04 LAB — CBC
HCT: 39.4 % (ref 36.0–46.0)
Hemoglobin: 13.2 g/dL (ref 12.0–15.0)
MCH: 32.1 pg (ref 26.0–34.0)
MCHC: 33.5 g/dL (ref 30.0–36.0)
MCV: 95.9 fL (ref 78.0–100.0)
PLATELETS: 182 10*3/uL (ref 150–400)
RBC: 4.11 MIL/uL (ref 3.87–5.11)
RDW: 12.2 % (ref 11.5–15.5)
WBC: 11.9 10*3/uL — ABNORMAL HIGH (ref 4.0–10.5)

## 2017-04-04 LAB — BASIC METABOLIC PANEL
Anion gap: 7 (ref 5–15)
BUN: 9 mg/dL (ref 6–20)
CALCIUM: 8.9 mg/dL (ref 8.9–10.3)
CO2: 26 mmol/L (ref 22–32)
CREATININE: 0.63 mg/dL (ref 0.44–1.00)
Chloride: 106 mmol/L (ref 101–111)
GFR calc non Af Amer: >60 mL/min (ref >60)
Glucose, Bld: 190 mg/dL — ABNORMAL HIGH (ref 65–99)
Potassium: 3.5 mmol/L (ref 3.5–5.1)
SODIUM: 139 mmol/L (ref 135–145)

## 2017-04-04 MED ORDER — TIZANIDINE HCL 4 MG PO TABS: 4.0000 mg | ORAL_TABLET | Freq: Three times a day (TID) | ORAL | 0 refills | Status: DC

## 2017-04-04 MED ORDER — OXYCODONE HCL 5 MG PO TABS: 5.0000 mg | ORAL_TABLET | ORAL | 0 refills | Status: DC | PRN

## 2017-04-04 MED ORDER — RIVAROXABAN 10 MG PO TABS: 10.0000 mg | ORAL_TABLET | Freq: Every day | ORAL | 0 refills | Status: DC

## 2017-04-04 NOTE — Discharge Summary (Signed)
Physician Discharge Summary   Patient ID: Kim Fuller MRN: [id number] DOB/AGE: November 25, 1945 71 y.o.  Admit date: 04/03/2017 Discharge date: 04-04-2017  Primary Diagnosis:  Medial compartment osteoarthritis, Left knee   Admission Diagnoses:  Past Medical History:  Diagnosis Date  . Abnormal finding on Pap smear    x1  . Diverticulosis   . GERD (gastroesophageal reflux disease)   . Hyperlipidemia   . Hypertension   . Osteopenia    -1.5 @ femoral neck 11/2007  . Reactive airway disease    years ago ; severe asthma attack "bornchitis asthma " per patient, no issues since    Discharge Diagnoses:   Principal Problem:   OA (osteoarthritis) of knee  Estimated body mass index is 36.58 kg/m as calculated from the following:   Height as of this encounter: 5' 2"  (1.575 m).   Weight as of this encounter: 90.7 kg (200 lb).  Procedure:  Procedure(s) (LRB): Left knee medial unicompartmental arthroplasty (Left)   Consults: None  HPI: Kim Fuller is a 71 y.o. female, who has  significant isolated medial compartment arthritis of the Left knee. The patient has had nonoperative management including injections of cortisone and viscous supplements. Unfortunately, the pain persists.  Radiograph showed isolated medial compartment bone-on-bone arthritis  with normal-appearing patellofemoral and lateral compartments. The patient presents now for left knee unicompartmental arthroplasty.    Laboratory Data: Admission on 04/03/2017  Component Date Value Ref Range Status  . WBC 04/04/2017 11.9* 4.0 - 10.5 K/uL Final  . RBC 04/04/2017 4.11  3.87 - 5.11 MIL/uL Final  . Hemoglobin 04/04/2017 13.2  12.0 - 15.0 g/dL Final  . HCT 04/04/2017 39.4  36.0 - 46.0 % Final  . MCV 04/04/2017 95.9  78.0 - 100.0 fL Final  . MCH 04/04/2017 32.1  26.0 - 34.0 pg Final  . MCHC 04/04/2017 33.5  30.0 - 36.0 g/dL Final  . RDW 04/04/2017 12.2  11.5 - 15.5 % Final  . Platelets 04/04/2017 182  150 - 400 K/uL  Final  . Sodium 04/04/2017 139  135 - 145 mmol/L Final  . Potassium 04/04/2017 3.5  3.5 - 5.1 mmol/L Final  . Chloride 04/04/2017 106  101 - 111 mmol/L Final  . CO2 04/04/2017 26  22 - 32 mmol/L Final  . Glucose, Bld 04/04/2017 190* 65 - 99 mg/dL Final  . BUN 04/04/2017 9  6 - 20 mg/dL Final  . Creatinine, Ser 04/04/2017 0.63  0.44 - 1.00 mg/dL Final  . Calcium 04/04/2017 8.9  8.9 - 10.3 mg/dL Final  . GFR calc non Af Amer 04/04/2017 >60  >60 mL/min Final  . GFR calc Af Amer 04/04/2017 >60  >60 mL/min Final   Comment: (NOTE) The eGFR has been calculated using the CKD EPI equation. This calculation has not been validated in all clinical situations. eGFR's persistently <60 mL/min signify possible Chronic Kidney Disease.   Georgiann Hahn gap 04/04/2017 7  5 - 15 Final  Hospital Outpatient Visit on 03/27/2017  Component Date Value Ref Range Status  . MRSA, PCR 03/27/2017 NEGATIVE  NEGATIVE Final  . Staphylococcus aureus 03/27/2017 NEGATIVE  NEGATIVE Final   Comment: (NOTE) The Xpert SA Assay (FDA approved for NASAL specimens in patients 23 years of age and older), is one component of a comprehensive surveillance program. It is not intended to diagnose infection nor to guide or monitor treatment.   Marland Kitchen aPTT 03/27/2017 30  24 - 36 seconds Final  . WBC 03/27/2017 6.3  4.0 -  10.5 K/uL Final  . RBC 03/27/2017 4.29  3.87 - 5.11 MIL/uL Final  . Hemoglobin 03/27/2017 13.7  12.0 - 15.0 g/dL Final  . HCT 03/27/2017 42.4  36.0 - 46.0 % Final  . MCV 03/27/2017 98.8  78.0 - 100.0 fL Final  . MCH 03/27/2017 31.9  26.0 - 34.0 pg Final  . MCHC 03/27/2017 32.3  30.0 - 36.0 g/dL Final  . RDW 03/27/2017 12.5  11.5 - 15.5 % Final  . Platelets 03/27/2017 177  150 - 400 K/uL Final  . Sodium 03/27/2017 141  135 - 145 mmol/L Final  . Potassium 03/27/2017 4.2  3.5 - 5.1 mmol/L Final  . Chloride 03/27/2017 102  101 - 111 mmol/L Final  . CO2 03/27/2017 32  22 - 32 mmol/L Final  . Glucose, Bld 03/27/2017 95  65 -  99 mg/dL Final  . BUN 03/27/2017 18  6 - 20 mg/dL Final  . Creatinine, Ser 03/27/2017 0.69  0.44 - 1.00 mg/dL Final  . Calcium 03/27/2017 9.5  8.9 - 10.3 mg/dL Final  . Total Protein 03/27/2017 7.7  6.5 - 8.1 g/dL Final  . Albumin 03/27/2017 4.3  3.5 - 5.0 g/dL Final  . AST 03/27/2017 25  15 - 41 U/L Final  . ALT 03/27/2017 21  14 - 54 U/L Final  . Alkaline Phosphatase 03/27/2017 55  38 - 126 U/L Final  . Total Bilirubin 03/27/2017 0.9  0.3 - 1.2 mg/dL Final  . GFR calc non Af Amer 03/27/2017 >60  >60 mL/min Final  . GFR calc Af Amer 03/27/2017 >60  >60 mL/min Final   Comment: (NOTE) The eGFR has been calculated using the CKD EPI equation. This calculation has not been validated in all clinical situations. eGFR's persistently <60 mL/min signify possible Chronic Kidney Disease.   . Anion gap 03/27/2017 7  5 - 15 Final  . Prothrombin Time 03/27/2017 13.4  11.4 - 15.2 seconds Final  . INR 03/27/2017 1.03   Final  . ABO/RH(D) 03/27/2017 O POS   Final  . Antibody Screen 03/27/2017 NEG   Final  . Sample Expiration 03/27/2017 04/06/2017   Final  . Extend sample reason 03/27/2017 NO TRANSFUSIONS OR PREGNANCY IN THE PAST 3 MONTHS   Final  . ABO/RH(D) 03/27/2017 O POS   Final  Appointment on 03/25/2017  Component Date Value Ref Range Status  . Cholesterol 03/25/2017 179  0 - 200 mg/dL Final   ATP III Classification       Desirable:  < 200 mg/dL               Borderline High:  200 - 239 mg/dL          High:  > = 240 mg/dL  . Triglycerides 03/25/2017 216.0* 0.0 - 149.0 mg/dL Final   Normal:  <150 mg/dLBorderline High:  150 - 199 mg/dL  . HDL 03/25/2017 45.60  >39.00 mg/dL Final  . VLDL 03/25/2017 43.2* 0.0 - 40.0 mg/dL Final  . Total CHOL/HDL Ratio 03/25/2017 4   Final                  Men          Women1/2 Average Risk     3.4          3.3Average Risk          5.0          4.42X Average Risk          9.6  7.13X Average Risk          15.0          11.0                      . NonHDL  03/25/2017 133.70   Final   NOTE:  Non-HDL goal should be 30 mg/dL higher than patient's LDL goal (i.e. LDL goal of < 70 mg/dL, would have non-HDL goal of < 100 mg/dL)  . Hgb A1c MFr Bld 03/25/2017 5.6  4.6 - 6.5 % Final   Glycemic Control Guidelines for People with Diabetes:Non Diabetic:  <6%Goal of Therapy: <7%Additional Action Suggested:  >8%   . Direct LDL 03/25/2017 92.0  mg/dL Final   Optimal:  <100 mg/dLNear or Above Optimal:  100-129 mg/dLBorderline High:  130-159 mg/dLHigh:  160-189 mg/dLVery High:  >190 mg/dL     X-Rays:No results found.  EKG: Orders placed or performed in visit on 03/25/17  . EKG 12-Lead     Hospital Course: Kim Fuller is a 71 y.o. who was admitted to [facility]. They were brought to the operating room on 04/03/2017 and underwent Procedure(s): Left knee medial unicompartmental arthroplasty.  Patient tolerated the procedure well and was later transferred to the recovery room and then to the orthopaedic floor for postoperative care.  They were given PO and IV analgesics for pain control following their surgery.  They were given 24 hours of postoperative antibiotics of  Anti-infectives (From admission, onward)   Start     Dose/Rate Route Frequency Ordered Stop   04/03/17 2200  ceFAZolin (ANCEF) IVPB 2g/100 mL premix     2 g 200 mL/hr over 30 Minutes Intravenous Every 6 hours 04/03/17 1952 04/04/17 0544   04/03/17 1442  ceFAZolin (ANCEF) 2-4 GM/100ML-% IVPB    Comments:  Bridget Hartshorn   : cabinet override      04/03/17 1442 04/03/17 1634   04/03/17 1415  ceFAZolin (ANCEF) IVPB 2g/100 mL premix     2 g 200 mL/hr over 30 Minutes Intravenous On call to O.R. 04/03/17 1405 04/03/17 1654     and started on DVT prophylaxis in the form of Xarelto.   PT and OT were ordered for postop therapy protocol.  Discharge planning consulted to help with postop disposition and equipment needs.  Patient had a good night on the evening of surgery.  They started to get up  OOB with therapy on day one. Hemovac drain was pulled without difficulty.  Patient was seen in rounds on day one and it was felt that as long as they did well with the remaining sessions of therapy that they would be ready to go home.  Arrangements were made and they were setup to go home on POD 1.  Discharge home - Straight to Outpatient Therapy at [facility] in [place], [place] Follow up - in 2 weeks Activity - WBAT Dressing - May remove the surgical dressing tomorrow at home and then apply a dry gauze dressing daily. May shower three days following surgery but do not submerge the incision under water. Disposition - Home Condition Upon Discharge - Stable D/C Meds - See DC Summary DVT Prophylaxis Xarelto 10 mg daily for ten days, then change to Aspirin 325 mg daily for two weeks, then reduce to Baby Aspirin 81 mg daily back on home dose.   Discharge Instructions    Call MD / Call 911   Complete by:  As  directed    If you experience chest pain or shortness of breath, CALL 911 and be transported to the hospital emergency room.  If you develope a fever above 101 F, pus (white drainage) or increased drainage or redness at the wound, or calf pain, call your surgeon's office.   Change dressing   Complete by:  As directed    Change dressing daily with sterile 4 x 4 inch gauze dressing and apply TED hose. Do not submerge the incision under water.   Constipation Prevention   Complete by:  As directed    Drink plenty of fluids.  Prune juice may be helpful.  You may use a stool softener, such as Colace (over the counter) 100 mg twice a day.  Use MiraLax (over the counter) for constipation as needed.   Diet - low sodium heart healthy   Complete by:  As directed    Diet Carb Modified   Complete by:  As directed    Discharge instructions   Complete by:  As directed    DVT Prophylaxis Xarelto 10 mg daily for ten days, then change to Aspirin 325 mg daily for two weeks, then  reduce to Baby Aspirin 81 mg daily back on home dose.  Pick up stool softner and laxative for home use following surgery while on pain medications. Do not submerge incision under water. Please use good hand washing techniques while changing dressing each day. May shower starting three days after surgery. Please use a clean towel to pat the incision dry following showers. Continue to use ice for pain and swelling after surgery. Do not use any lotions or creams on the incision until instructed by your surgeon.  Wear both TED hose on both legs during the day every day for three weeks, but may remove the TED hose at night at home.  Postoperative Constipation Protocol   Constipation - defined medically as fewer than three stools per week and severe constipation as less than one stool per week.  One of the most common issues patients have following surgery is constipation.  Even if you have a regular bowel pattern at home, your normal regimen is likely to be disrupted due to multiple reasons following surgery.  Combination of anesthesia, postoperative narcotics, change in appetite and fluid intake all can affect your bowels.  In order to avoid complications following surgery, here are some recommendations in order to help you during your recovery period.  Colace (docusate) - Pick up an over-the-counter form of Colace or another stool softener and take twice a day as long as you are requiring postoperative pain medications.  Take with a full glass of water daily.  If you experience loose stools or diarrhea, hold the colace until you stool forms back up.  If your symptoms do not get better within 1 week or if they get worse, check with your doctor.  Dulcolax (bisacodyl) - Pick up over-the-counter and take as directed by the product packaging as needed to assist with the movement of your bowels.  Take with a full glass of water.  Use this product as needed if not relieved by Colace only.   MiraLax  (polyethylene glycol) - Pick up over-the-counter to have on hand.  MiraLax is a solution that will increase the amount of water in your bowels to assist with bowel movements.  Take as directed and can mix with a glass of water, juice, soda, coffee, or tea.  Take if you go more than two days without  a movement. Do not use MiraLax more than once per day. Call your doctor if you are still constipated or irregular after using this medication for 7 days in a row.  If you continue to have problems with postoperative constipation, please contact the office for further assistance and recommendations.  If you experience "the worst abdominal pain ever" or develop nausea or vomiting, please contact the office immediatly for further recommendations for treatment.    Do not put a pillow under the knee. Place it under the heel.   Complete by:  As directed    Do not sit on low chairs, stoools or toilet seats, as it may be difficult to get up from low surfaces   Complete by:  As directed    Driving restrictions   Complete by:  As directed    No driving until released by the physician.   Increase activity slowly as tolerated   Complete by:  As directed    Lifting restrictions   Complete by:  As directed    No lifting until released by the physician.   Patient may shower   Complete by:  As directed    You may shower without a dressing once there is no drainage.  Do not wash over the wound.  If drainage remains, do not shower until drainage stops.   TED hose   Complete by:  As directed    Use stockings (TED hose) for 3 weeks on both leg(s).  You may remove them at night for sleeping.   Weight bearing as tolerated   Complete by:  As directed    Laterality:  left   Extremity:  Lower     Allergies as of 04/04/2017      Reactions   Sulfonamide Derivatives    Rash Because of a history of documented adverse serious drug reaction;Medi Alert bracelet  is recommended   Tramadol Hcl    REACTION: cold sweats,  weak, fatigue      Medication List    STOP taking these medications   aspirin 81 MG chewable tablet   CALTRATE 600 PO   cholecalciferol 1000 units tablet Commonly known as:  VITAMIN D   Fish Oil 1000 MG Caps   MULTIVITAMIN PO   TART CHERRY ADVANCED PO   Turmeric 500 MG Caps   vitamin E 400 UNIT capsule     TAKE these medications   benazepril 40 MG tablet Commonly known as:  LOTENSIN Take 0.5 tablets (20 mg total) by mouth daily. TAKE AS DIRECTED 1/2 TO 1 TABLET BY MOUTH EVERY DAY TO MAINTAIN BLOOD PRESSURE AVERAGE OF 130/85.   bisoprolol-hydrochlorothiazide 5-6.25 MG tablet Commonly known as:  ZIAC TAKE 1 TABLET BY MOUTH ONCE DAILY.   fluticasone 50 MCG/ACT nasal spray Commonly known as:  FLONASE Place 2 sprays into both nostrils daily. What changed:    when to take this  reasons to take this   omeprazole 20 MG capsule Commonly known as:  PRILOSEC Take 20 mg daily as needed by mouth.   oxyCODONE 5 MG immediate release tablet Commonly known as:  Oxy IR/ROXICODONE Take 1-2 tablets (5-10 mg total) by mouth every 4 (four) hours as needed for moderate pain or severe pain.   pravastatin 20 MG tablet Commonly known as:  PRAVACHOL Take 1 tablet (20 mg total) by mouth daily. What changed:  when to take this   rivaroxaban 10 MG Tabs tablet Commonly known as:  XARELTO Take 1 tablet (10 mg total) by mouth  daily with breakfast. Xarelto 10 mg daily for ten days, then change to Aspirin 325 mg daily for two weeks, then reduce back to then home dose of Baby Aspirin 81 mg daily.   SYSTANE BALANCE 0.6 % Soln Generic drug:  Propylene Glycol Place 1 drop 3 (three) times daily as needed into both eyes (dry eyes).   tiZANidine 4 MG tablet Commonly known as:  ZANAFLEX Take 1 tablet (4 mg total) by mouth 3 (three) times daily.            Discharge Care Instructions  (From admission, onward)        Start     Ordered   04/04/17 0000  Weight bearing as tolerated      Question Answer Comment  Laterality left   Extremity Lower      04/04/17 0929   04/04/17 0000  Change dressing    Comments:  Change dressing daily with sterile 4 x 4 inch gauze dressing and apply TED hose. Do not submerge the incision under water.   04/04/17 9179     Follow-up Information    Gaynelle Arabian, MD. Schedule an appointment as soon as possible for a visit on 04/16/2017.   Specialty:  Orthopedic Surgery Contact information: [address] [place] [address] [phone]           Signed: Arlee Muslim, PA-C Orthopaedic Surgery 04/04/2017, 9:30 AM

## 2017-04-04 NOTE — Progress Notes (Signed)
Spoke with patient at bedside. Confirmed plan for OP PT, already arranged. Has RW but requesting 3n1. Declines the shower chair. Contacted [facility] to deliver to room.  [phone]

## 2017-04-04 NOTE — Care Management Obs Status (Signed)
[facility] NOTIFICATION   Patient Details  Name: PESSY DELAMAR MRN: [id number] Date of Birth: 01-09-1946   Medicare Observation Status Notification Given:  Yes    Guadalupe Maple, RN 04/04/2017, 11:58 AM

## 2017-04-04 NOTE — Progress Notes (Addendum)
   Subjective: 1 Day Post-Op Procedure(s) (LRB): Left knee medial unicompartmental arthroplasty (Left) Patient reports pain as mild.   Patient seen in rounds by Dr. Wynelle Link. Patient is well, but has had some minor complaints of pain in the knee, requiring pain medications Patient is ready to go home today.  Objective: Vital signs in last 24 hours: Temp:  [97.5 F (36.4 C)-98.1 F (36.7 C)] 98 F (36.7 C) (11/29 0906) Pulse Rate:  [50-80] 78 (11/29 0906) Resp:  [9-24] 16 (11/29 0906) BP: (105-145)/(44-91) 120/55 (11/29 0906) SpO2:  [89 %-100 %] 91 % (11/29 0906) Weight:  [90.7 kg (200 lb)] 90.7 kg (200 lb) (11/28 1453)  Intake/Output from previous day:  Intake/Output Summary (Last 24 hours) at 04/04/2017 0920 Last data filed at 04/04/2017 0846 Gross per 24 hour  Intake 3435 ml  Output 2335 ml  Net 1100 ml    Intake/Output this shift: Total I/O In: 360 [P.O.:360] Out: 175 [Urine:175]  Labs: Recent Labs    04/04/17 0540  HGB 13.2   Recent Labs    04/04/17 0540  WBC 11.9*  RBC 4.11  HCT 39.4  PLT 182   Recent Labs    04/04/17 0540  NA 139  K 3.5  CL 106  CO2 26  BUN 9  CREATININE 0.63  GLUCOSE 190*  CALCIUM 8.9   No results for input(s): LABPT, INR in the last 72 hours.  EXAM: General - Patient is Alert and Appropriate Extremity - Neurovascular intact Sensation intact distally Intact pulses distally Dorsiflexion/Plantar flexion intact Dressing - clean, dry Motor Function - intact, moving foot and toes well on exam.  Hemovac pulled without difficulty.  Assessment/Plan: 1 Day Post-Op Procedure(s) (LRB): Left knee medial unicompartmental arthroplasty (Left) Procedure(s) (LRB): Left knee medial unicompartmental arthroplasty (Left) Past Medical History:  Diagnosis Date  . Abnormal finding on Pap smear    x1  . Diverticulosis   . GERD (gastroesophageal reflux disease)   . Hyperlipidemia   . Hypertension   . Osteopenia    -1.5 @ femoral neck  11/2007  . Reactive airway disease    years ago ; severe asthma attack "bornchitis asthma " per patient, no issues since    Principal Problem:   OA (osteoarthritis) of knee  Estimated body mass index is 36.58 kg/m as calculated from the following:   Height as of this encounter: 5\' 2"  (1.575 m).   Weight as of this encounter: 90.7 kg (200 lb). Advance diet Up with therapy Discharge home - Straight to Outpatient Therapy at [facility] in [place], [place] Follow up - in 2 weeks Activity - WBAT Dressing - May remove the surgical dressing tomorrow at home and then apply a dry gauze dressing daily. May shower three days following surgery but do not submerge the incision under water. Disposition - Home Condition Upon Discharge - Stable D/C Meds - See DC Summary DVT Prophylaxis Xarelto 10 mg daily for ten days, then change to Aspirin 325 mg daily for two weeks, then reduce to Baby Aspirin 81 mg daily back on home dose.  Arlee Muslim, PA-C Orthopaedic Surgery 04/04/2017, 9:20 AM

## 2017-04-04 NOTE — Evaluation (Signed)
Occupational Therapy Evaluation Patient Details Name: Kim Fuller MRN: [id number] DOB: 04-14-1946 Today's Date: 04/04/2017    History of Present Illness Left knee medial unicompartmental arthroplasty (Left)   Clinical Impression   OT education complete    Follow Up Recommendations  No OT follow up    Equipment Recommendations  3 in 1 bedside commode;Tub/shower seat       Precautions / Restrictions Precautions Precautions: Knee Restrictions Weight Bearing Restrictions: No      Mobility Bed Mobility               General bed mobility comments: pt in chair  Transfers Overall transfer level: Needs assistance Equipment used: Rolling walker (2 wheeled) Transfers: Stand Pivot Transfers;Sit to/from Stand Sit to Stand: Min guard Stand pivot transfers: Min guard                ADL either performed or assessed with clinical judgement   ADL Overall ADL's : Needs assistance/impaired Eating/Feeding: Set up;Sitting   Grooming: Set up;Sitting   Upper Body Bathing: Set up;Sitting   Lower Body Bathing: Minimal assistance;Sit to/from stand;Cueing for safety;Cueing for sequencing   Upper Body Dressing : Set up;Sitting   Lower Body Dressing: Minimal assistance;Cueing for safety;Cueing for sequencing;Sit to/from stand   Toilet Transfer: Supervision/safety;RW;Comfort height toilet;Ambulation   Toileting- Clothing Manipulation and Hygiene: Supervision/safety;Sit to/from stand;Cueing for safety   Tub/ Shower Transfer: Walk-in shower;Min guard;Rolling walker;Shower seat   Functional mobility during ADLs: Cueing for safety;Cueing for sequencing;Min guard       Vision Baseline Vision/History: No visual deficits Patient Visual Report: No change from baseline              Pertinent Vitals/Pain Pain Assessment: 0-10 Pain Score: 2  Pain Location:  l knee Pain Descriptors / Indicators: Sore Pain Intervention(s): Limited activity within patient's  tolerance;Monitored during session     Hand Dominance     Extremity/Trunk Assessment Upper Extremity Assessment Upper Extremity Assessment: Overall WFL for tasks assessed           Communication Communication Communication: No difficulties   Cognition Arousal/Alertness: Awake/alert Behavior During Therapy: WFL for tasks assessed/performed Overall Cognitive Status: Within Functional Limits for tasks assessed                                                Home Living Family/patient expects to be discharged to:: Private residence Living Arrangements: Spouse/significant other Available Help at Discharge: Family Type of Home: House       Home Layout: Two level Alternate Level Stairs-Number of Steps: 12   Bathroom Shower/Tub: Occupational psychologist: Standard     Home Equipment: Environmental consultant - 2 wheels           OT Goals(Current goals can be found in the care plan section) Acute Rehab OT Goals Patient Stated Goal: home  OT Goal Formulation: With patient  OT Frequency:      AM-PAC PT "6 Clicks" Daily Activity     Outcome Measure Help from another person eating meals?: None Help from another person taking care of personal grooming?: None Help from another person toileting, which includes using toliet, bedpan, or urinal?: A Little Help from another person bathing (including washing, rinsing, drying)?: None Help from another person to put on and taking off regular upper body clothing?: A Little Help from another person  to put on and taking off regular lower body clothing?: A Little 6 Click Score: 21   End of Session Equipment Utilized During Treatment: Rolling walker Nurse Communication: Mobility status  Activity Tolerance: Patient tolerated treatment well Patient left: in chair;with call bell/phone within reach                   Time: 1045-1104 OT Time Calculation (min): 19 min Charges:  OT General Charges $OT Visit: 1 Visit OT  Evaluation $OT Eval Low Complexity: 1 Low G-Codes: OT G-codes **NOT FOR INPATIENT CLASS** Functional Assessment Tool Used: Clinical judgement Functional Limitation: Self care Self Care Current Status (Y6415): At least 20 percent but less than 40 percent impaired, limited or restricted Self Care Goal Status (A3094): At least 1 percent but less than 20 percent impaired, limited or restricted Self Care Discharge Status [phone]): At least 20 percent but less than 40 percent impaired, limited or restricted   Kari Baars, [place]  Payton Mccallum D 04/04/2017, 11:24 AM

## 2017-04-04 NOTE — Progress Notes (Signed)
Physical Therapy Treatment Patient Details Name: Kim Fuller MRN: [id number] DOB: [birth year] Today's Date: 04/04/2017    History of Present Illness Pt admitted for Left knee medial unicompartmental arthroplasty on 04/03/17    PT Comments    Pt ambulated again in hallway and practiced one step.  Pt with decreased balance attempting step initially so pt performed again (without LOB).  Family to assist pt with steps at home for safety.  Pt eager to d/c home.  HEP handout provided and pt had no further questions.    Follow Up Recommendations  Outpatient PT;DC plan and follow up therapy as arranged by surgeon     Equipment Recommendations  None recommended by PT    Recommendations for Other Services       Precautions / Restrictions Precautions Precautions: Knee Required Braces or Orthoses: Knee Immobilizer - Left Restrictions Other Position/Activity Restrictions: WBAT    Mobility  Bed Mobility Overal bed mobility: Needs Assistance Bed Mobility: Supine to Sit     Supine to sit: Supervision     General bed mobility comments: increased time and effort, UEs assisted L LE over EOB  Transfers Overall transfer level: Needs assistance Equipment used: Rolling walker (2 wheeled) Transfers: Sit to/from Stand Sit to Stand: Min guard         General transfer comment: verbal cues for UE and LE positioning  Ambulation/Gait Ambulation/Gait assistance: Min guard Ambulation Distance (Feet): 180 Feet Assistive device: Rolling walker (2 wheeled) Gait Pattern/deviations: Step-to pattern;Decreased stance time - left;Antalgic     General Gait Details: verbal cues for sequence, RW positioning, step length   Stairs Stairs: Yes   Stair Management: Step to pattern;Forwards;With walker Number of Stairs: 1 General stair comments: verbal cues for sequence and safety, assist required for balance as pt attempted to step up without feet close to step, improved with second  performance, reminded pt to slow down (also verbally reviewed with family)  Wheelchair Mobility    Modified Rankin (Stroke Patients Only)       Balance                                            Cognition Arousal/Alertness: Awake/alert Behavior During Therapy: WFL for tasks assessed/performed Overall Cognitive Status: Within Functional Limits for tasks assessed                                        Exercises     General Comments        Pertinent Vitals/Pain Pain Assessment: 0-10 Pain Score: 2  Pain Location: left knee Pain Descriptors / Indicators: Sore;Aching Pain Intervention(s): Limited activity within patient's tolerance;Monitored during session    Home Living Family/patient expects to be discharged to:: Private residence Living Arrangements: Spouse/significant other Available Help at Discharge: Family Type of Home: House Home Access: Stairs to enter   Home Layout: Two level;Able to live on main level with bedroom/bathroom Home Equipment: Walker - 2 wheels      Prior Function Level of Independence: Independent          PT Goals (current goals can now be found in the care plan section) Acute Rehab PT Goals PT Goal Formulation: With patient Time For Goal Achievement: 04/08/17 Potential to Achieve Goals: Good Progress towards PT goals: Progressing toward goals  Frequency    7X/week      PT Plan Current plan remains appropriate    Co-evaluation              AM-PAC PT "6 Clicks" Daily Activity  Outcome Measure  Difficulty turning over in bed (including adjusting bedclothes, sheets and blankets)?: A Little Difficulty moving from lying on back to sitting on the side of the bed? : A Little Difficulty sitting down on and standing up from a chair with arms (e.g., wheelchair, bedside commode, etc,.)?: A Little Help needed moving to and from a bed to chair (including a wheelchair)?: A Little Help needed  walking in hospital room?: A Little Help needed climbing 3-5 steps with a railing? : A Little 6 Click Score: 18    End of Session Equipment Utilized During Treatment: Gait belt;Left knee immobilizer Activity Tolerance: Patient tolerated treatment well Patient left: in chair;with call bell/phone within reach;with family/visitor present Nurse Communication: Mobility status PT Visit Diagnosis: Other abnormalities of gait and mobility (R26.89)     Time: 1355-1413 PT Time Calculation (min) (ACUTE ONLY): 18 min  Charges:  $Gait Training: 8-22 mins                    G Codes:  Functional Assessment Tool Used: AM-PAC 6 Clicks Basic Mobility;Clinical judgement Functional Limitation: Mobility: Walking and moving around Mobility: Walking and Moving Around Current Status (S0109): At least 20 percent but less than 40 percent impaired, limited or restricted Mobility: Walking and Moving Around Goal Status [phone]): At least 1 percent but less than 20 percent impaired, limited or restricted    Carmelia Bake, PT, DPT 04/04/2017 Pager: [phone]   York Ram E 04/04/2017, 3:51 PM

## 2017-04-04 NOTE — Evaluation (Addendum)
Physical Therapy Evaluation Patient Details Name: Kim Fuller MRN: [id number] DOB: 16-Apr-1946 Today's Date: 04/04/2017   History of Present Illness  Pt admitted for Left knee medial unicompartmental arthroplasty on 04/03/17  Clinical Impression  Pt is s/p L unicompartmental arthroplasty resulting in the deficits listed below (see PT Problem List).  Pt will benefit from skilled PT to increase their independence and safety with mobility to allow discharge to the venue listed below.  Pt ambulated in hallway and performed LE exercises POD #1.  Pt to likely d/c home later today.  Will return to practice steps.     Follow Up Recommendations Outpatient PT;DC plan and follow up therapy as arranged by surgeon    Equipment Recommendations  None recommended by PT    Recommendations for Other Services       Precautions / Restrictions Precautions Precautions: Knee Required Braces or Orthoses: Knee Immobilizer - Left Restrictions Weight Bearing Restrictions: No Other Position/Activity Restrictions: WBAT      Mobility  Bed Mobility Overal bed mobility: Needs Assistance Bed Mobility: Supine to Sit     Supine to sit: Min guard     General bed mobility comments: pt in chair  Transfers Overall transfer level: Needs assistance Equipment used: Rolling walker (2 wheeled) Transfers: Sit to/from Stand Sit to Stand: Min guard Stand pivot transfers: Min guard       General transfer comment: verbal cues for UE and LE positioning  Ambulation/Gait Ambulation/Gait assistance: Min guard Ambulation Distance (Feet): 160 Feet Assistive device: Rolling walker (2 wheeled) Gait Pattern/deviations: Step-to pattern;Decreased stance time - left;Antalgic     General Gait Details: verbal cues for sequence, RW positioning, step length  Stairs            Wheelchair Mobility    Modified Rankin (Stroke Patients Only)       Balance                                              Pertinent Vitals/Pain Pain Assessment: 0-10 Pain Score: 3  Pain Location: left knee Pain Descriptors / Indicators: Sore;Aching Pain Intervention(s): Repositioned;Limited activity within patient's tolerance;Monitored during session;Ice applied    Home Living Family/patient expects to be discharged to:: Private residence Living Arrangements: Spouse/significant other Available Help at Discharge: Family Type of Home: House Home Access: Stairs to enter   Technical brewer of Steps: 1 Home Layout: Two level;Able to live on main level with bedroom/bathroom Home Equipment: Walker - 2 wheels      Prior Function Level of Independence: Independent               Hand Dominance        Extremity/Trunk Assessment   Upper Extremity Assessment Upper Extremity Assessment: Overall WFL for tasks assessed    Lower Extremity Assessment Lower Extremity Assessment: LLE deficits/detail LLE Deficits / Details: unable to perform SLR without lag, L knee AAROM knee flexion 45*       Communication   Communication: No difficulties  Cognition Arousal/Alertness: Awake/alert Behavior During Therapy: WFL for tasks assessed/performed Overall Cognitive Status: Within Functional Limits for tasks assessed                                        General Comments      Exercises  Total Joint Exercises Ankle Circles/Pumps: AROM;Both;10 reps Quad Sets: AROM;Both;10 reps Short Arc QuadSinclair Ship;Left;10 reps Heel Slides: AAROM;10 reps;Left Hip ABduction/ADduction: AROM;10 reps;Left Straight Leg Raises: Left;AAROM;10 reps   Assessment/Plan    PT Assessment Patient needs continued PT services  PT Problem List Decreased strength;Decreased mobility;Decreased range of motion;Decreased knowledge of use of DME;Decreased knowledge of precautions;Pain       PT Treatment Interventions Gait training;Therapeutic exercise;Functional mobility training;Stair  training;Patient/family education;DME instruction;Therapeutic activities    PT Goals (Current goals can be found in the Care Plan section)  Acute Rehab PT Goals Patient Stated Goal: home  PT Goal Formulation: With patient Time For Goal Achievement: 04/08/17 Potential to Achieve Goals: Good    Frequency 7X/week   Barriers to discharge        Co-evaluation               AM-PAC PT "6 Clicks" Daily Activity  Outcome Measure Difficulty turning over in bed (including adjusting bedclothes, sheets and blankets)?: A Little Difficulty moving from lying on back to sitting on the side of the bed? : A Little Difficulty sitting down on and standing up from a chair with arms (e.g., wheelchair, bedside commode, etc,.)?: A Little Help needed moving to and from a bed to chair (including a wheelchair)?: A Little Help needed walking in hospital room?: A Little Help needed climbing 3-5 steps with a railing? : A Lot 6 Click Score: 17    End of Session Equipment Utilized During Treatment: Gait belt;Left knee immobilizer Activity Tolerance: Patient tolerated treatment well Patient left: in chair;with call bell/phone within reach Nurse Communication: Mobility status PT Visit Diagnosis: Other abnormalities of gait and mobility (R26.89)    Time: 7680-8811 PT Time Calculation (min) (ACUTE ONLY): 14 min   Charges:   PT Evaluation $PT Eval Low Complexity: 1 Low     PT G Codes:   PT G-Codes **NOT FOR INPATIENT CLASS** Functional Assessment Tool Used: AM-PAC 6 Clicks Basic Mobility;Clinical judgement Functional Limitation: Mobility: Walking and moving around Mobility: Walking and Moving Around Current Status (S3159): At least 20 percent but less than 40 percent impaired, limited or restricted Mobility: Walking and Moving Around Goal Status [phone]): At least 1 percent but less than 20 percent impaired, limited or restricted    Carmelia Bake, PT, DPT 04/04/2017 Pager:  [phone]   York Ram E 04/04/2017, 12:29 PM

## 2017-04-05 ENCOUNTER — Telehealth: Payer: Self-pay

## 2017-04-05 DIAGNOSIS — R262 Difficulty in walking, not elsewhere classified: Secondary | ICD-10-CM | POA: Diagnosis not present

## 2017-04-05 DIAGNOSIS — M25662 Stiffness of left knee, not elsewhere classified: Secondary | ICD-10-CM | POA: Diagnosis not present

## 2017-04-05 DIAGNOSIS — M25562 Pain in left knee: Secondary | ICD-10-CM | POA: Diagnosis not present

## 2017-04-05 DIAGNOSIS — M6281 Muscle weakness (generalized): Secondary | ICD-10-CM | POA: Diagnosis not present

## 2017-04-05 NOTE — Telephone Encounter (Signed)
Pt on TCM list. Dc'ed on 04/03/2017 after admission for left knee replacement.   Radiograph showed isolated medial compartment bone-on-bone arthritis  with normal-appearing patellofemoral and lateral compartments. The patient presents now for left knee unicompartmental arthroplasty.    Pt to follow up with ortho.

## 2017-04-08 DIAGNOSIS — M25562 Pain in left knee: Secondary | ICD-10-CM | POA: Diagnosis not present

## 2017-04-08 DIAGNOSIS — M25662 Stiffness of left knee, not elsewhere classified: Secondary | ICD-10-CM | POA: Diagnosis not present

## 2017-04-08 DIAGNOSIS — M6281 Muscle weakness (generalized): Secondary | ICD-10-CM | POA: Diagnosis not present

## 2017-04-08 DIAGNOSIS — R262 Difficulty in walking, not elsewhere classified: Secondary | ICD-10-CM | POA: Diagnosis not present

## 2017-04-12 DIAGNOSIS — M25562 Pain in left knee: Secondary | ICD-10-CM | POA: Diagnosis not present

## 2017-04-12 DIAGNOSIS — M6281 Muscle weakness (generalized): Secondary | ICD-10-CM | POA: Diagnosis not present

## 2017-04-12 DIAGNOSIS — M25662 Stiffness of left knee, not elsewhere classified: Secondary | ICD-10-CM | POA: Diagnosis not present

## 2017-04-12 DIAGNOSIS — R262 Difficulty in walking, not elsewhere classified: Secondary | ICD-10-CM | POA: Diagnosis not present

## 2017-04-16 DIAGNOSIS — M1712 Unilateral primary osteoarthritis, left knee: Secondary | ICD-10-CM | POA: Diagnosis not present

## 2017-04-17 DIAGNOSIS — M25562 Pain in left knee: Secondary | ICD-10-CM | POA: Diagnosis not present

## 2017-04-17 DIAGNOSIS — R262 Difficulty in walking, not elsewhere classified: Secondary | ICD-10-CM | POA: Diagnosis not present

## 2017-04-17 DIAGNOSIS — M6281 Muscle weakness (generalized): Secondary | ICD-10-CM | POA: Diagnosis not present

## 2017-04-17 DIAGNOSIS — M25662 Stiffness of left knee, not elsewhere classified: Secondary | ICD-10-CM | POA: Diagnosis not present

## 2017-04-19 DIAGNOSIS — M6281 Muscle weakness (generalized): Secondary | ICD-10-CM | POA: Diagnosis not present

## 2017-04-19 DIAGNOSIS — R262 Difficulty in walking, not elsewhere classified: Secondary | ICD-10-CM | POA: Diagnosis not present

## 2017-04-19 DIAGNOSIS — M25662 Stiffness of left knee, not elsewhere classified: Secondary | ICD-10-CM | POA: Diagnosis not present

## 2017-04-19 DIAGNOSIS — M25562 Pain in left knee: Secondary | ICD-10-CM | POA: Diagnosis not present

## 2017-04-24 ENCOUNTER — Encounter: Payer: Self-pay | Admitting: Internal Medicine

## 2017-04-24 ENCOUNTER — Other Ambulatory Visit: Payer: Self-pay

## 2017-04-24 DIAGNOSIS — R262 Difficulty in walking, not elsewhere classified: Secondary | ICD-10-CM | POA: Diagnosis not present

## 2017-04-24 DIAGNOSIS — M25662 Stiffness of left knee, not elsewhere classified: Secondary | ICD-10-CM | POA: Diagnosis not present

## 2017-04-24 DIAGNOSIS — M6281 Muscle weakness (generalized): Secondary | ICD-10-CM | POA: Diagnosis not present

## 2017-04-24 DIAGNOSIS — M25562 Pain in left knee: Secondary | ICD-10-CM | POA: Diagnosis not present

## 2017-04-24 MED ORDER — PRAVASTATIN SODIUM 20 MG PO TABS: 20.0000 mg | ORAL_TABLET | Freq: Every day | ORAL | 3 refills | Status: DC

## 2017-04-26 DIAGNOSIS — M25662 Stiffness of left knee, not elsewhere classified: Secondary | ICD-10-CM | POA: Diagnosis not present

## 2017-04-26 DIAGNOSIS — M25562 Pain in left knee: Secondary | ICD-10-CM | POA: Diagnosis not present

## 2017-04-26 DIAGNOSIS — M6281 Muscle weakness (generalized): Secondary | ICD-10-CM | POA: Diagnosis not present

## 2017-04-26 DIAGNOSIS — R262 Difficulty in walking, not elsewhere classified: Secondary | ICD-10-CM | POA: Diagnosis not present

## 2017-05-01 DIAGNOSIS — M6281 Muscle weakness (generalized): Secondary | ICD-10-CM | POA: Diagnosis not present

## 2017-05-01 DIAGNOSIS — M25662 Stiffness of left knee, not elsewhere classified: Secondary | ICD-10-CM | POA: Diagnosis not present

## 2017-05-01 DIAGNOSIS — R262 Difficulty in walking, not elsewhere classified: Secondary | ICD-10-CM | POA: Diagnosis not present

## 2017-05-01 DIAGNOSIS — M25562 Pain in left knee: Secondary | ICD-10-CM | POA: Diagnosis not present

## 2017-05-03 DIAGNOSIS — M6281 Muscle weakness (generalized): Secondary | ICD-10-CM | POA: Diagnosis not present

## 2017-05-03 DIAGNOSIS — M25562 Pain in left knee: Secondary | ICD-10-CM | POA: Diagnosis not present

## 2017-05-03 DIAGNOSIS — R262 Difficulty in walking, not elsewhere classified: Secondary | ICD-10-CM | POA: Diagnosis not present

## 2017-05-03 DIAGNOSIS — M25662 Stiffness of left knee, not elsewhere classified: Secondary | ICD-10-CM | POA: Diagnosis not present

## 2017-05-06 DIAGNOSIS — M6281 Muscle weakness (generalized): Secondary | ICD-10-CM | POA: Diagnosis not present

## 2017-05-06 DIAGNOSIS — M25562 Pain in left knee: Secondary | ICD-10-CM | POA: Diagnosis not present

## 2017-05-06 DIAGNOSIS — M25662 Stiffness of left knee, not elsewhere classified: Secondary | ICD-10-CM | POA: Diagnosis not present

## 2017-05-06 DIAGNOSIS — R262 Difficulty in walking, not elsewhere classified: Secondary | ICD-10-CM | POA: Diagnosis not present

## 2017-05-10 DIAGNOSIS — M6281 Muscle weakness (generalized): Secondary | ICD-10-CM | POA: Diagnosis not present

## 2017-05-10 DIAGNOSIS — M25562 Pain in left knee: Secondary | ICD-10-CM | POA: Diagnosis not present

## 2017-05-10 DIAGNOSIS — M25662 Stiffness of left knee, not elsewhere classified: Secondary | ICD-10-CM | POA: Diagnosis not present

## 2017-05-10 DIAGNOSIS — R262 Difficulty in walking, not elsewhere classified: Secondary | ICD-10-CM | POA: Diagnosis not present

## 2017-05-13 DIAGNOSIS — M25562 Pain in left knee: Secondary | ICD-10-CM | POA: Diagnosis not present

## 2017-05-13 DIAGNOSIS — M25662 Stiffness of left knee, not elsewhere classified: Secondary | ICD-10-CM | POA: Diagnosis not present

## 2017-05-13 DIAGNOSIS — M6281 Muscle weakness (generalized): Secondary | ICD-10-CM | POA: Diagnosis not present

## 2017-05-13 DIAGNOSIS — R262 Difficulty in walking, not elsewhere classified: Secondary | ICD-10-CM | POA: Diagnosis not present

## 2017-05-14 DIAGNOSIS — Z96651 Presence of right artificial knee joint: Secondary | ICD-10-CM | POA: Diagnosis not present

## 2017-05-16 ENCOUNTER — Other Ambulatory Visit: Payer: Self-pay | Admitting: Obstetrics

## 2017-05-16 DIAGNOSIS — Z1231 Encounter for screening mammogram for malignant neoplasm of breast: Secondary | ICD-10-CM

## 2017-05-17 DIAGNOSIS — M25662 Stiffness of left knee, not elsewhere classified: Secondary | ICD-10-CM | POA: Diagnosis not present

## 2017-05-17 DIAGNOSIS — M25562 Pain in left knee: Secondary | ICD-10-CM | POA: Diagnosis not present

## 2017-05-17 DIAGNOSIS — M6281 Muscle weakness (generalized): Secondary | ICD-10-CM | POA: Diagnosis not present

## 2017-05-17 DIAGNOSIS — R262 Difficulty in walking, not elsewhere classified: Secondary | ICD-10-CM | POA: Diagnosis not present

## 2017-05-20 DIAGNOSIS — R262 Difficulty in walking, not elsewhere classified: Secondary | ICD-10-CM | POA: Diagnosis not present

## 2017-05-20 DIAGNOSIS — M6281 Muscle weakness (generalized): Secondary | ICD-10-CM | POA: Diagnosis not present

## 2017-05-20 DIAGNOSIS — M25562 Pain in left knee: Secondary | ICD-10-CM | POA: Diagnosis not present

## 2017-05-20 DIAGNOSIS — M25662 Stiffness of left knee, not elsewhere classified: Secondary | ICD-10-CM | POA: Diagnosis not present

## 2017-05-24 DIAGNOSIS — M25562 Pain in left knee: Secondary | ICD-10-CM | POA: Diagnosis not present

## 2017-05-24 DIAGNOSIS — M6281 Muscle weakness (generalized): Secondary | ICD-10-CM | POA: Diagnosis not present

## 2017-05-24 DIAGNOSIS — R262 Difficulty in walking, not elsewhere classified: Secondary | ICD-10-CM | POA: Diagnosis not present

## 2017-05-24 DIAGNOSIS — M25662 Stiffness of left knee, not elsewhere classified: Secondary | ICD-10-CM | POA: Diagnosis not present

## 2017-05-29 DIAGNOSIS — M25662 Stiffness of left knee, not elsewhere classified: Secondary | ICD-10-CM | POA: Diagnosis not present

## 2017-05-29 DIAGNOSIS — M25562 Pain in left knee: Secondary | ICD-10-CM | POA: Diagnosis not present

## 2017-05-29 DIAGNOSIS — R262 Difficulty in walking, not elsewhere classified: Secondary | ICD-10-CM | POA: Diagnosis not present

## 2017-05-29 DIAGNOSIS — M6281 Muscle weakness (generalized): Secondary | ICD-10-CM | POA: Diagnosis not present

## 2017-05-30 DIAGNOSIS — R262 Difficulty in walking, not elsewhere classified: Secondary | ICD-10-CM | POA: Diagnosis not present

## 2017-05-30 DIAGNOSIS — M25662 Stiffness of left knee, not elsewhere classified: Secondary | ICD-10-CM | POA: Diagnosis not present

## 2017-05-30 DIAGNOSIS — M25562 Pain in left knee: Secondary | ICD-10-CM | POA: Diagnosis not present

## 2017-05-30 DIAGNOSIS — M6281 Muscle weakness (generalized): Secondary | ICD-10-CM | POA: Diagnosis not present

## 2017-06-14 DIAGNOSIS — D1801 Hemangioma of skin and subcutaneous tissue: Secondary | ICD-10-CM | POA: Diagnosis not present

## 2017-06-14 DIAGNOSIS — L814 Other melanin hyperpigmentation: Secondary | ICD-10-CM | POA: Diagnosis not present

## 2017-06-14 DIAGNOSIS — D2271 Melanocytic nevi of right lower limb, including hip: Secondary | ICD-10-CM | POA: Diagnosis not present

## 2017-06-14 DIAGNOSIS — D2239 Melanocytic nevi of other parts of face: Secondary | ICD-10-CM | POA: Diagnosis not present

## 2017-06-14 DIAGNOSIS — D2261 Melanocytic nevi of right upper limb, including shoulder: Secondary | ICD-10-CM | POA: Diagnosis not present

## 2017-06-14 DIAGNOSIS — L821 Other seborrheic keratosis: Secondary | ICD-10-CM | POA: Diagnosis not present

## 2017-06-14 DIAGNOSIS — L738 Other specified follicular disorders: Secondary | ICD-10-CM | POA: Diagnosis not present

## 2017-06-14 DIAGNOSIS — D2272 Melanocytic nevi of left lower limb, including hip: Secondary | ICD-10-CM | POA: Diagnosis not present

## 2017-06-14 DIAGNOSIS — D2262 Melanocytic nevi of left upper limb, including shoulder: Secondary | ICD-10-CM | POA: Diagnosis not present

## 2017-06-14 DIAGNOSIS — D225 Melanocytic nevi of trunk: Secondary | ICD-10-CM | POA: Diagnosis not present

## 2017-06-18 ENCOUNTER — Ambulatory Visit
Admission: RE | Admit: 2017-06-18 | Discharge: 2017-06-18 | Disposition: A | Discharge status: Home / Self Care | Payer: Medicare Other | Source: Ambulatory Visit | Attending: Obstetrics | Admitting: Obstetrics

## 2017-06-18 DIAGNOSIS — Z1231 Encounter for screening mammogram for malignant neoplasm of breast: Secondary | ICD-10-CM | POA: Diagnosis not present

## 2017-06-30 NOTE — Progress Notes (Signed)
[name] [place], [place] [address] Phone: [phone] Subjective:    I'm seeing this patient by the request  of:    CC: Finger pain follow-up  LOV:[id number]  Kim Fuller is a 72 y.o. female coming in with complaint of finger pain.  Was seen 7 months ago.  Diagnosed with a trigger finger.  Patient was given an injection.  Patient states that the middle finger on the right hand began getting painful in December. She is unable to grasp items as her pain has increased since December.     Past Medical History:  Diagnosis Date  . Abnormal finding on Pap smear    x1  . Diverticulosis   . GERD (gastroesophageal reflux disease)   . Hyperlipidemia   . Hypertension   . Osteopenia    -1.5 @ femoral neck 11/2007  . Reactive airway disease    years ago ; severe asthma attack "bornchitis asthma " per patient, no issues since    Past Surgical History:  Procedure Laterality Date  . CARDIOVASCULAR STRESS TEST  03/05/06  . CARPAL TUNNEL RELEASE  2009    bilaterally  . CATARACT EXTRACTION, BILATERAL     Dr Gershon Crane  . COLONOSCOPY W/ POLYPECTOMY  1998   Tics @ 2004 & 2012; Dr Carlean Purl  . HAND SURGERY     Trigger thumb  . PARTIAL KNEE ARTHROPLASTY Left 04/03/2017   Procedure: Left knee medial unicompartmental arthroplasty;  Surgeon: Gaynelle Arabian, MD;  Location: [facility];  Service: Orthopedics;  Laterality: Left;  Adductor canal block  . TUBAL LIGATION     Social History   Socioeconomic History  . Marital status: Married    Spouse name: None  . Number of children: None  . Years of education: None  . Highest education level: None  Social Needs  . Financial resource strain: Not hard at all  . Food insecurity - worry: Never true  . Food insecurity - inability: Never true  . Transportation needs - medical: No  . Transportation needs - non-medical: No  Occupational History  . Occupation: Systems analyst  Tobacco Use  . Smoking status:  Former Smoker    Last attempt to quit: 05/07/1970    Years since quitting: 47.1  . Smokeless tobacco: Never Used  . Tobacco comment: Smoked as a teen  1962-1972,only up to 3 cigarettes/ day  Substance and Sexual Activity  . Alcohol use: Yes    Comment: occas  . Drug use: No  . Sexual activity: None  Other Topics Concern  . None  Social History Narrative  . None   Allergies  Allergen Reactions  . Sulfonamide Derivatives     Rash Because of a history of documented adverse serious drug reaction;Medi Alert bracelet  is recommended  . Tramadol Hcl     REACTION: cold sweats, weak, fatigue   Family History  Problem Relation Age of Onset  . Asthma Sister   . Hypertension Sister   . Hypertension Father   . Heart attack Father        MI > 41  . Prostate cancer Father   . Lung cancer Sister        smoker  . Melanoma Sister   . Osteoporosis Sister   . Glaucoma Sister        X 2  . Diverticulosis Sister        2 sisters  . Lung cancer Sister   . Colon polyps Sister  2 sisters with polyps  . Diverticulitis Sister        1 sister  . Hypertension Mother   . Hypertension Brother   . Skin cancer Sister   . Diabetes Neg Hx   . Stroke Neg Hx      Past medical history, social, surgical and family history all reviewed in electronic medical record.  No pertanent information unless stated regarding to the chief complaint.   Review of Systems:Review of systems updated and as accurate as of 07/02/17  No headache, visual changes, nausea, vomiting, diarrhea, constipation, dizziness, abdominal pain, skin rash, fevers, chills, night sweats, weight loss, swollen lymph nodes, body aches, joint swelling,chest pain, shortness of breath, mood changes.  Positive muscle aches  Objective  Blood pressure (!) 150/90, pulse (!) 57, weight 201 lb 9.6 oz (91.4 kg), SpO2 96 %. Systems examined below as of 07/02/17   General: No apparent distress alert and oriented x3 mood and affect normal,  dressed appropriately.  HEENT: Pupils equal, extraocular movements intact  Respiratory: Patient's speak in full sentences and does not appear short of breath  Cardiovascular: No lower extremity edema, non tender, no erythema  Skin: Warm dry intact with no signs of infection or rash on extremities or on axial skeleton.  Abdomen: Soft nontender  Neuro: Cranial nerves II through XII are intact, neurovascularly intact in all extremities with 2+ DTRs and 2+ pulses.  Lymph: No lymphadenopathy of posterior or anterior cervical chain or axillae bilaterally.  Gait normal with good balance and coordination.  MSK:  Non tender with full range of motion and good stability and symmetric strength and tone of shoulders, elbows, wrist, hip, and ankles bilaterally.  Arthritic changes but patient does have a left knee replacement that is new since previous visit Right hand exam shows the patient does have a trigger nodule at the A2 pulley of the third finger.  Severely tender to palpation.  Procedure: Real-time Ultrasound Guided Injection of trigger nodule right middle finger Device: GE Logiq Q7 Ultrasound guided injection is preferred based studies that show increased duration, increased effect, greater accuracy, decreased procedural pain, increased response rate, and decreased cost with ultrasound guided versus blind injection.  Verbal informed consent obtained.  Time-out conducted.  Noted no overlying erythema, induration, or other signs of local infection.  Skin prepped in a sterile fashion.  Local anesthesia: Topical Ethyl chloride.  With sterile technique and under real time ultrasound guidance: With a 25-gauge half inch needle was injected with 0.5 cc of 0.5% Marcaine and 0.5 cc of Kenalog 40 mg/mL Completed without difficulty  Pain immediately resolved suggesting accurate placement of the medication.  Advised to call if fevers/chills, erythema, induration, drainage, or persistent bleeding.  Images  permanently stored and available for review in the ultrasound unit.  Impression: Technically successful ultrasound guided injection.    Impression and Recommendations:     This case required medical decision making of moderate complexity.      Note: This dictation was prepared with Dragon dictation along with smaller phrase technology. Any transcriptional errors that result from this process are unintentional.

## 2017-07-02 ENCOUNTER — Ambulatory Visit (INDEPENDENT_AMBULATORY_CARE_PROVIDER_SITE_OTHER): Payer: Medicare Other | Admitting: Family Medicine

## 2017-07-02 ENCOUNTER — Ambulatory Visit: Payer: Self-pay

## 2017-07-02 ENCOUNTER — Encounter: Payer: Self-pay | Admitting: Family Medicine

## 2017-07-02 VITALS — BP 150/90 | HR 57 | Wt 201.6 lb

## 2017-07-02 DIAGNOSIS — M65331 Trigger finger, right middle finger: Secondary | ICD-10-CM | POA: Insufficient documentation

## 2017-07-02 DIAGNOSIS — M79641 Pain in right hand: Secondary | ICD-10-CM

## 2017-07-02 NOTE — Assessment & Plan Note (Signed)
Injected today.  Tolerated the procedure well.  Discussed icing regimen and home exercises.  Discussed which activities of doing which wants to avoid.

## 2017-07-02 NOTE — Patient Instructions (Signed)
Good to see you  Kim Fuller is your friend.  Stay active.  You should do great  MAybe see me again in 3 months but otherwise see me when you need me

## 2017-07-08 DIAGNOSIS — Z961 Presence of intraocular lens: Secondary | ICD-10-CM | POA: Diagnosis not present

## 2017-08-30 ENCOUNTER — Other Ambulatory Visit: Payer: Self-pay | Admitting: Internal Medicine

## 2017-10-17 ENCOUNTER — Other Ambulatory Visit: Payer: Self-pay | Admitting: Internal Medicine

## 2018-01-10 NOTE — Progress Notes (Signed)
[name] [place], [place] [address] Phone: [phone] Subjective:     I [name] am serving as a Education administrator for Dr. [name].   CC: Right hand pain  VZC:[id number]  Kim Fuller is a 72 y.o. female coming in with complaint of right hand pain. States that she is in a lot of pain. Wants an injection today. States that it is getting stuck at night.  Rates the severity pain is 8 out of 10 when it opens.  Affecting daily activities, affecting grip strength. Patient has been seen before and was given injection in February of this year.  Responded well at that time.  Worsening pain is only been the last month.    Past Medical History:  Diagnosis Date  . Abnormal finding on Pap smear    x1  . Diverticulosis   . GERD (gastroesophageal reflux disease)   . Hyperlipidemia   . Hypertension   . Osteopenia    -1.5 @ femoral neck 11/2007  . Reactive airway disease    years ago ; severe asthma attack "bornchitis asthma " per patient, no issues since    Past Surgical History:  Procedure Laterality Date  . CARDIOVASCULAR STRESS TEST  03/05/06  . CARPAL TUNNEL RELEASE  2009    bilaterally  . CATARACT EXTRACTION, BILATERAL     Dr Gershon Crane  . COLONOSCOPY W/ POLYPECTOMY  1998   Tics @ 2004 & 2012; Dr Carlean Purl  . HAND SURGERY     Trigger thumb  . PARTIAL KNEE ARTHROPLASTY Left 04/03/2017   Procedure: Left knee medial unicompartmental arthroplasty;  Surgeon: Gaynelle Arabian, MD;  Location: [facility];  Service: Orthopedics;  Laterality: Left;  Adductor canal block  . TUBAL LIGATION     Social History   Socioeconomic History  . Marital status: Married    Spouse name: Not on file  . Number of children: Not on file  . Years of education: Not on file  . Highest education level: Not on file  Occupational History  . Occupation: Runner, broadcasting/film/video  . Financial resource strain: Not hard at all  . Food insecurity:    Worry: Never  true    Inability: Never true  . Transportation needs:    Medical: No    Non-medical: No  Tobacco Use  . Smoking status: Former Smoker    Last attempt to quit: 05/07/1970    Years since quitting: 47.7  . Smokeless tobacco: Never Used  . Tobacco comment: Smoked as a teen  1962-1972,only up to 3 cigarettes/ day  Substance and Sexual Activity  . Alcohol use: Yes    Comment: occas  . Drug use: No  . Sexual activity: Not on file  Lifestyle  . Physical activity:    Days per week: 0 days    Minutes per session: 0 min  . Stress: Not at all  Relationships  . Social connections:    Talks on phone: More than three times a week    Gets together: More than three times a week    Attends religious service: Not on file    Active member of club or organization: Not on file    Attends meetings of clubs or organizations: Not on file    Relationship status: Married  Other Topics Concern  . Not on file  Social History Narrative  . Not on file   Allergies  Allergen Reactions  . Sulfonamide Derivatives  Rash Because of a history of documented adverse serious drug reaction;Medi Alert bracelet  is recommended  . Tramadol Hcl     REACTION: cold sweats, weak, fatigue   Family History  Problem Relation Age of Onset  . Asthma Sister   . Hypertension Sister   . Hypertension Father   . Heart attack Father        MI > 15  . Prostate cancer Father   . Lung cancer Sister        smoker  . Melanoma Sister   . Osteoporosis Sister   . Glaucoma Sister        X 2  . Diverticulosis Sister        2 sisters  . Lung cancer Sister   . Colon polyps Sister        2 sisters with polyps  . Diverticulitis Sister        1 sister  . Hypertension Mother   . Hypertension Brother   . Skin cancer Sister   . Diabetes Neg Hx   . Stroke Neg Hx      Current Outpatient Medications (Cardiovascular):  .  benazepril (LOTENSIN) 40 MG tablet, Take 0.5 tablets (20 mg total) by mouth daily. TAKE AS DIRECTED 1/2  TO 1 TABLET BY MOUTH EVERY DAY TO MAINTAIN BLOOD PRESSURE AVERAGE OF 130/85. .  bisoprolol-hydrochlorothiazide (ZIAC) 5-6.25 MG tablet, TAKE 1 TABLET BY MOUTH ONCE DAILY .  pravastatin (PRAVACHOL) 20 MG tablet, Take 1 tablet (20 mg total) by mouth daily.  Current Outpatient Medications (Respiratory):  .  fluticasone (FLONASE) 50 MCG/ACT nasal spray, USE TWO SPRAY(S) IN EACH NOSTRIL DAILY  Current Outpatient Medications (Analgesics):  .  oxyCODONE (OXY IR/ROXICODONE) 5 MG immediate release tablet, Take 1-2 tablets (5-10 mg total) by mouth every 4 (four) hours as needed for moderate pain or severe pain. (Patient taking differently: Take 1-2 tablets (5-10 mg total) by mouth every 4 (four) hours as needed for moderate pain or severe pain.)  Current Outpatient Medications (Hematological):  .  rivaroxaban (XARELTO) 10 MG TABS tablet, Take 1 tablet (10 mg total) by mouth daily with breakfast. Xarelto 10 mg daily for ten days, then change to Aspirin 325 mg daily for two weeks, then reduce back to then home dose of Baby Aspirin 81 mg daily.  Current Outpatient Medications (Other):  .  omeprazole (PRILOSEC) 20 MG capsule, Take 20 mg by mouth daily as needed.   Marland Kitchen  Propylene Glycol (SYSTANE BALANCE) 0.6 % SOLN, Place 1 drop 3 (three) times daily as needed into both eyes (dry eyes). Marland Kitchen  tiZANidine (ZANAFLEX) 4 MG tablet, Take 1 tablet (4 mg total) by mouth 3 (three) times daily.    Past medical history, social, surgical and family history all reviewed in electronic medical record.  No pertanent information unless stated regarding to the chief complaint.   Review of Systems:  No headache, visual changes, nausea, vomiting, diarrhea, constipation, dizziness, abdominal pain, skin rash, fevers, chills, night sweats, weight loss, swollen lymph nodes, body aches, joint swelling, chest pain, shortness of breath, mood changes.  Positive muscle aches  Objective  Blood pressure 122/70, pulse (!) 50, height 5\' 2"   (1.575 m), weight 200 lb (90.7 kg), SpO2 92 %.    General: No apparent distress alert and oriented x3 mood and affect normal, dressed appropriately.  HEENT: Pupils equal, extraocular movements intact  Respiratory: Patient's speak in full sentences and does not appear short of breath  Cardiovascular: No lower extremity edema, non  tender, no erythema  Skin: Warm dry intact with no signs of infection or rash on extremities or on axial skeleton.  Abdomen: Soft nontender  Neuro: Cranial nerves II through XII are intact, neurovascularly intact in all extremities with 2+ DTRs and 2+ pulses.  Lymph: No lymphadenopathy of posterior or anterior cervical chain or axillae bilaterally.  Gait normal with good balance and coordination.  MSK:  tender with mild limited range of motion and good stability and symmetric strength and tone of shoulders, elbows, wrist, hip, knee and ankles bilaterally.  Pain exam on the right side shows the patient does have a trigger nodule at the A2 pulley of the flexor tendon sheath of the middle finger.  Severely tender to palpation.  Neurovascularly intact distally.  No triggering noted today.  Procedure: Real-time Ultrasound Guided Injection of right flexor tendon sheath of the third finger on the right side Device: GE Logiq Q7 Ultrasound guided injection is preferred based studies that show increased duration, increased effect, greater accuracy, decreased procedural pain, increased response rate, and decreased cost with ultrasound guided versus blind injection.  Verbal informed consent obtained.  Time-out conducted.  Noted no overlying erythema, induration, or other signs of local infection.  Skin prepped in a sterile fashion.  Local anesthesia: Topical Ethyl chloride.  With sterile technique and under real time ultrasound guidance: With a 25-gauge half inch needle injected with 0.5 cc of 0.5% Marcaine and 0.5 cc of Kenalog 40 mg/mL Completed without difficulty  Pain  immediately resolved suggesting accurate placement of the medication.  Advised to call if fevers/chills, erythema, induration, drainage, or persistent bleeding.  Images permanently stored and available for review in the ultrasound unit.  Impression: Technically successful ultrasound guided injection.     Impression and Recommendations:     This case required medical decision making of moderate complexity. The above documentation has been reviewed and is accurate and complete Lyndal Pulley, DO       Note: This dictation was prepared with Dragon dictation along with smaller phrase technology. Any transcriptional errors that result from this process are unintentional.

## 2018-01-13 ENCOUNTER — Ambulatory Visit (INDEPENDENT_AMBULATORY_CARE_PROVIDER_SITE_OTHER): Payer: Medicare Other | Admitting: Family Medicine

## 2018-01-13 ENCOUNTER — Encounter: Payer: Self-pay | Admitting: Family Medicine

## 2018-01-13 ENCOUNTER — Ambulatory Visit: Payer: Self-pay

## 2018-01-13 VITALS — BP 122/70 | HR 50 | Ht 62.0 in | Wt 200.0 lb

## 2018-01-13 DIAGNOSIS — M65331 Trigger finger, right middle finger: Secondary | ICD-10-CM | POA: Diagnosis not present

## 2018-01-13 DIAGNOSIS — M79644 Pain in right finger(s): Secondary | ICD-10-CM | POA: Diagnosis not present

## 2018-01-13 NOTE — Assessment & Plan Note (Signed)
Repeat injection given today.  7 months since previous injection.  Discussed bracing at night and topical anti-inflammatories.  Follow-up again in 4 weeks for further evaluation

## 2018-01-13 NOTE — Patient Instructions (Signed)
Good to see you  Kim Fuller is your friend.  Stay active.  Wear the brace at night for 1-2 weeks.  Should get better quickly  Make an appointment in 6 weeks just in case.

## 2018-02-17 ENCOUNTER — Other Ambulatory Visit: Payer: Self-pay | Admitting: Internal Medicine

## 2018-02-19 ENCOUNTER — Other Ambulatory Visit: Payer: Self-pay | Admitting: Internal Medicine

## 2018-02-24 ENCOUNTER — Ambulatory Visit: Payer: Medicare Other | Admitting: Family Medicine

## 2018-02-25 DIAGNOSIS — Z23 Encounter for immunization: Secondary | ICD-10-CM | POA: Diagnosis not present

## 2018-03-18 DIAGNOSIS — Z96652 Presence of left artificial knee joint: Secondary | ICD-10-CM | POA: Diagnosis not present

## 2018-03-18 DIAGNOSIS — Z471 Aftercare following joint replacement surgery: Secondary | ICD-10-CM | POA: Diagnosis not present

## 2018-04-01 NOTE — Progress Notes (Signed)
Subjective:   Kim Fuller is a 72 y.o. female who presents for Medicare Annual (Subsequent) preventive examination.  Review of Systems:  No ROS.  Medicare Wellness Visit. Additional risk factors are reflected in the social history.  Cardiac Risk Factors include: advanced age (>34men, >59 women);dyslipidemia;hypertension Sleep patterns: feels rested on waking, gets up 1-2 times nightly to void and sleeps 7 hours nightly.    Home Safety/Smoke Alarms: Feels safe in home. Smoke alarms in place.  Living environment; residence and Firearm Safety: 1-story house/ trailer, no firearms Lives with husband, no needs for DME, good support system. Seat Belt Safety/Bike Helmet: Wears seat belt.     Objective:     Vitals: BP 121/62   Pulse (!) 45   Resp 17   Ht 5\' 2"  (1.575 m)   Wt 195 lb (88.5 kg)   LMP  (LMP Unknown)   SpO2 97%   BMI 35.67 kg/m   Body mass index is 35.67 kg/m.  Advanced Directives 04/02/2018 04/03/2017 03/27/2017 03/27/2017  Does Patient Have a Medical Advance Directive? No No No No  Would patient like information on creating a medical advance directive? No - Patient declined No - Patient declined No - Patient declined Yes (ED - Information included in AVS)    Tobacco Social History   Tobacco Use  Smoking Status Former Smoker  . Last attempt to quit: 05/07/1970  . Years since quitting: 47.9  Smokeless Tobacco Never Used  Tobacco Comment   Smoked as a teen  1962-1972,only up to 3 cigarettes/ day     Counseling given: Not Answered Comment: Smoked as a teen  1962-1972,only up to 3 cigarettes/ day  Past Medical History:  Diagnosis Date  . Abnormal finding on Pap smear    x1  . Diverticulosis   . GERD (gastroesophageal reflux disease)   . Hyperlipidemia   . Hypertension   . Osteopenia    -1.5 @ femoral neck 11/2007  . Reactive airway disease    years ago ; severe asthma attack "bornchitis asthma " per patient, no issues since    Past Surgical History:    Procedure Laterality Date  . CARDIOVASCULAR STRESS TEST  03/05/06  . CARPAL TUNNEL RELEASE  2009    bilaterally  . CATARACT EXTRACTION, BILATERAL     Dr Gershon Crane  . COLONOSCOPY W/ POLYPECTOMY  1998   Tics @ 2004 & 2012; Dr Carlean Purl  . HAND SURGERY     Trigger thumb  . PARTIAL KNEE ARTHROPLASTY Left 04/03/2017   Procedure: Left knee medial unicompartmental arthroplasty;  Surgeon: Gaynelle Arabian, MD;  Location: [facility];  Service: Orthopedics;  Laterality: Left;  Adductor canal block  . TUBAL LIGATION     Family History  Problem Relation Age of Onset  . Asthma Sister   . Hypertension Sister   . Hypertension Father   . Heart attack Father        MI > 73  . Prostate cancer Father   . Lung cancer Sister        smoker  . Melanoma Sister   . Osteoporosis Sister   . Glaucoma Sister        X 2  . Diverticulosis Sister        2 sisters  . Lung cancer Sister   . Colon polyps Sister        2 sisters with polyps  . Diverticulitis Sister        1 sister  . Hypertension Mother   .  Hypertension Brother   . Skin cancer Sister   . Diabetes Neg Hx   . Stroke Neg Hx    Social History   Socioeconomic History  . Marital status: Married    Spouse name: Not on file  . Number of children: 2  . Years of education: Not on file  . Highest education level: Not on file  Occupational History  . Occupation: retired  Scientific laboratory technician  . Financial resource strain: Not hard at all  . Food insecurity:    Worry: Never true    Inability: Never true  . Transportation needs:    Medical: No    Non-medical: No  Tobacco Use  . Smoking status: Former Smoker    Last attempt to quit: 05/07/1970    Years since quitting: 47.9  . Smokeless tobacco: Never Used  . Tobacco comment: Smoked as a teen  1962-1972,only up to 3 cigarettes/ day  Substance and Sexual Activity  . Alcohol use: Yes    Comment: occas  . Drug use: No  . Sexual activity: Not Currently  Lifestyle  . Physical activity:    Days per week:  5 days    Minutes per session: 50 min  . Stress: Not at all  Relationships  . Social connections:    Talks on phone: More than three times a week    Gets together: More than three times a week    Attends religious service: More than 4 times per year    Active member of club or organization: Not on file    Attends meetings of clubs or organizations: More than 4 times per year    Relationship status: Married  Other Topics Concern  . Not on file  Social History Narrative  . Not on file    Outpatient Encounter Medications as of 04/02/2018  Medication Sig  . aspirin 81 MG chewable tablet Chew 81 mg by mouth daily.  . benazepril (LOTENSIN) 40 MG tablet TAKE ONE-HALF TO ONE TABLET BY MOUTH ONCE DAILY TO MAINTAIN BLOOD PRESSURE AVERAGE OF 130/85 AS DIRECTED  . bisoprolol-hydrochlorothiazide (ZIAC) 5-6.25 MG tablet TAKE 1 TABLET BY MOUTH ONCE DAILY  . calcium carbonate (OSCAL) 1500 (600 Ca) MG TABS tablet Take 600 mg of elemental calcium by mouth 2 (two) times daily with a meal.  . cholecalciferol (VITAMIN D3) 10 MCG (400 UNIT) TABS tablet Take 400 Units by mouth.  . esomeprazole (NEXIUM) 10 MG packet Take 10 mg by mouth daily before breakfast.  . fluticasone (FLONASE) 50 MCG/ACT nasal spray USE TWO SPRAY(S) IN EACH NOSTRIL DAILY  . Multiple Vitamins-Minerals (CENTRUM SILVER ULTRA WOMENS PO) Take 1 tablet by mouth daily.  . Omega-3 Fatty Acids (FISH OIL) 1000 MG CAPS Take 1,000 mg by mouth 2 (two) times daily.  . pravastatin (PRAVACHOL) 20 MG tablet Take 1 tablet (20 mg total) by mouth daily.  Marland Kitchen Propylene Glycol (SYSTANE BALANCE) 0.6 % SOLN Place 1 drop 3 (three) times daily as needed into both eyes (dry eyes).  . vitamin E 400 UNIT capsule Take 400 Units by mouth daily.  . [DISCONTINUED] omeprazole (PRILOSEC) 20 MG capsule Take 20 mg by mouth daily as needed.    . [DISCONTINUED] oxyCODONE (OXY IR/ROXICODONE) 5 MG immediate release tablet Take 1-2 tablets (5-10 mg total) by mouth every 4  (four) hours as needed for moderate pain or severe pain. (Patient not taking: Reported on 04/02/2018)  . [DISCONTINUED] rivaroxaban (XARELTO) 10 MG TABS tablet Take 1 tablet (10 mg total) by mouth  daily with breakfast. Xarelto 10 mg daily for ten days, then change to Aspirin 325 mg daily for two weeks, then reduce back to then home dose of Baby Aspirin 81 mg daily. (Patient not taking: Reported on 04/02/2018)  . [DISCONTINUED] tiZANidine (ZANAFLEX) 4 MG tablet Take 1 tablet (4 mg total) by mouth 3 (three) times daily. (Patient not taking: Reported on 04/02/2018)   No facility-administered encounter medications on file as of 04/02/2018.     Activities of Daily Living In your present state of health, do you have any difficulty performing the following activities: 04/02/2018 04/03/2017  Hearing? N N  Vision? N N  Difficulty concentrating or making decisions? N N  Walking or climbing stairs? N Y  Dressing or bathing? N N  Doing errands, shopping? N N  Preparing Food and eating ? N -  Using the Toilet? N -  In the past six months, have you accidently leaked urine? N -  Do you have problems with loss of bowel control? N -  Managing your Medications? N -  Managing your Finances? N -  Housekeeping or managing your Housekeeping? N -  Some recent data might be hidden    Patient Care Team: Hoyt Koch, MD as PCP - General (Internal Medicine)    Assessment:   This is a routine wellness examination for Laylonie. Physical assessment deferred to PCP.   Exercise Activities and Dietary recommendations Current Exercise Habits: Home exercise routine;Structured exercise class, Type of exercise: walking;strength training/weights(water aerobics), Time (Minutes): 50, Frequency (Times/Week): 5, Weekly Exercise (Minutes/Week): 250, Intensity: Mild, Exercise limited by: None identified  Diet (meal preparation, eat out, water intake, caffeinated beverages, dairy products, fruits and vegetables): in  general, a "healthy" diet  , well balanced. eats a variety of fruits and vegetables daily, limits salt, fat/cholesterol, sugar,carbohydrates,caffeine, drinks 6-8 glasses of water daily.  Participating in [employer]    . patient stated     Increase water intake, I will set out water so I can see it to remind me to drink. Exercise more to help with weight loss.    . Patient Stated     I want to continue to stay on the [employer] program, exercise, stay socially active.       Fall Risk Fall Risk  04/02/2018 03/27/2017 03/25/2017 11/23/2016 06/23/2015  Falls in the past year? 0 No No No Yes  Comment - - - Emmi Telephone Survey: data to providers prior to load -  Number falls in past yr: - - - - 1  Injury with Fall? - - - - Yes   Depression Screen PHQ 2/9 Scores 04/02/2018 03/27/2017 03/25/2017 06/23/2015  PHQ - 2 Score 0 0 0 0  PHQ- 9 Score - 0 - -     Cognitive Function MMSE - Mini Mental State Exam 03/27/2017  Orientation to time 5  Orientation to Place 5  Registration 3  Attention/ Calculation 5  Recall 2  Language- name 2 objects 2  Language- repeat 1  Language- follow 3 step command 3  Language- read & follow direction 1  Write a sentence 1  Copy design 1  Total score 29       Ad8 score reviewed for issues:  Issues making decisions: no  Less interest in hobbies / activities: no  Repeats questions, stories (family complaining): no  Trouble using ordinary gadgets (microwave, computer, phone):no  Forgets the month or year: no  Mismanaging finances: no  Remembering appts:  no  Daily problems with thinking and/or memory: no Ad8 score is= 0  Immunization History  Administered Date(s) Administered  . Influenza Whole 02/05/2003  . Influenza, High Dose Seasonal PF 02/05/2014, 02/04/2016, 02/25/2018  . Influenza,inj,Quad PF,6+ Mos 02/17/2015  . Influenza-Unspecified 03/07/2013, 02/11/2017  . Pneumococcal Conjugate-13 03/12/2015  . Pneumococcal  Polysaccharide-23 03/20/2007, 01/29/2013  . Td 05/08/1999, 06/09/2009  . Zoster 03/03/2014   Screening Tests Health Maintenance  Topic Date Due  . TETANUS/TDAP  06/10/2019  . MAMMOGRAM  06/19/2019  . COLONOSCOPY  07/26/2020  . INFLUENZA VACCINE  Completed  . DEXA SCAN  Completed  . Hepatitis C Screening  Completed  . PNA vac Low Risk Adult  Completed      Plan:      Continue doing brain stimulating activities (puzzles, reading, adult coloring books, staying active) to keep memory sharp.   Continue to eat heart healthy diet (full of fruits, vegetables, whole grains, lean protein, water--limit salt, fat, and sugar intake) and increase physical activity as tolerated.  I have personally reviewed and noted the following in the patient's chart:   . Medical and social history . Use of alcohol, tobacco or illicit drugs  . Current medications and supplements . Functional ability and status . Nutritional status . Physical activity . Advanced directives . List of other physicians . Vitals . Screenings to include cognitive, depression, and falls . Referrals and appointments  In addition, I have reviewed and discussed with patient certain preventive protocols, quality metrics, and best practice recommendations. A written personalized care plan for preventive services as well as general preventive health recommendations were provided to patient.     Michiel Cowboy, RN  04/02/2018

## 2018-04-02 ENCOUNTER — Ambulatory Visit (INDEPENDENT_AMBULATORY_CARE_PROVIDER_SITE_OTHER): Payer: Medicare Other | Admitting: *Deleted

## 2018-04-02 VITALS — BP 121/62 | HR 45 | Resp 17 | Ht 62.0 in | Wt 195.0 lb

## 2018-04-02 DIAGNOSIS — Z0000 Encounter for general adult medical examination without abnormal findings: Secondary | ICD-10-CM | POA: Diagnosis not present

## 2018-04-02 NOTE — Patient Instructions (Signed)
Continue doing brain stimulating activities (puzzles, reading, adult coloring books, staying active) to keep memory sharp.   Continue to eat heart healthy diet (full of fruits, vegetables, whole grains, lean protein, water--limit salt, fat, and sugar intake) and increase physical activity as tolerated.   Ms. Kim Fuller , Thank you for taking time to come for your Medicare Wellness Visit. I appreciate your ongoing commitment to your health goals. Please review the following plan we discussed and let me know if I can assist you in the future.   These are the goals we discussed: Goals    . patient stated     Increase water intake, I will set out water so I can see it to remind me to drink. Exercise more to help with weight loss.    . Patient Stated     I want to continue to stay on the [employer] program, exercise, stay socially active.       This is a list of the screening recommended for you and due dates:  Health Maintenance  Topic Date Due  . Tetanus Vaccine  06/10/2019  . Mammogram  06/19/2019  . Colon Cancer Screening  07/26/2020  . Flu Shot  Completed  . DEXA scan (bone density measurement)  Completed  .  Hepatitis C: One time screening is recommended by Center for Disease Control  (CDC) for  adults born from [birth year] through 1966.   Completed  . Pneumonia vaccines  Completed   Health Maintenance, Female Adopting a healthy lifestyle and getting preventive care can go a long way to promote health and wellness. Talk with your health care provider about what schedule of regular examinations is right for you. This is a good chance for you to check in with your provider about disease prevention and staying healthy. In between checkups, there are plenty of things you can do on your own. Experts have done a lot of research about which lifestyle changes and preventive measures are most likely to keep you healthy. Ask your health care provider for more information. Weight and diet Eat a healthy  diet  Be sure to include plenty of vegetables, fruits, low-fat dairy products, and lean protein.  Do not eat a lot of foods high in solid fats, added sugars, or salt.  Get regular exercise. This is one of the most important things you can do for your health. ? Most adults should exercise for at least 150 minutes each week. The exercise should increase your heart rate and make you sweat (moderate-intensity exercise). ? Most adults should also do strengthening exercises at least twice a week. This is in addition to the moderate-intensity exercise.  Maintain a healthy weight  Body mass index (BMI) is a measurement that can be used to identify possible weight problems. It estimates body fat based on height and weight. Your health care provider can help determine your BMI and help you achieve or maintain a healthy weight.  For females 33 years of age and older: ? A BMI below 18.5 is considered underweight. ? A BMI of 18.5 to 24.9 is normal. ? A BMI of 25 to 29.9 is considered overweight. ? A BMI of 30 and above is considered obese.  Watch levels of cholesterol and blood lipids  You should start having your blood tested for lipids and cholesterol at 73 years of age, then have this test every 5 years.  You may need to have your cholesterol levels checked more often if: ? Your lipid or cholesterol levels  are high. ? You are older than 73 years of age. ? You are at high risk for heart disease.  Cancer screening Lung Cancer  Lung cancer screening is recommended for adults 4-16 years old who are at high risk for lung cancer because of a history of smoking.  A yearly low-dose CT scan of the lungs is recommended for people who: ? Currently smoke. ? Have quit within the past 15 years. ? Have at least a 30-pack-year history of smoking. A pack year is smoking an average of one pack of cigarettes a day for 1 year.  Yearly screening should continue until it has been 15 years since you  quit.  Yearly screening should stop if you develop a health problem that would prevent you from having lung cancer treatment.  Breast Cancer  Practice breast self-awareness. This means understanding how your breasts normally appear and feel.  It also means doing regular breast self-exams. Let your health care provider know about any changes, no matter how small.  If you are in your 20s or 30s, you should have a clinical breast exam (CBE) by a health care provider every 1-3 years as part of a regular health exam.  If you are 23 or older, have a CBE every year. Also consider having a breast X-ray (mammogram) every year.  If you have a family history of breast cancer, talk to your health care provider about genetic screening.  If you are at high risk for breast cancer, talk to your health care provider about having an MRI and a mammogram every year.  Breast cancer gene (BRCA) assessment is recommended for women who have family members with BRCA-related cancers. BRCA-related cancers include: ? Breast. ? Ovarian. ? Tubal. ? Peritoneal cancers.  Results of the assessment will determine the need for genetic counseling and BRCA1 and BRCA2 testing.  Cervical Cancer Your health care provider may recommend that you be screened regularly for cancer of the pelvic organs (ovaries, uterus, and vagina). This screening involves a pelvic examination, including checking for microscopic changes to the surface of your cervix (Pap test). You may be encouraged to have this screening done every 3 years, beginning at age 31.  For women ages 11-65, health care providers may recommend pelvic exams and Pap testing every 3 years, or they may recommend the Pap and pelvic exam, combined with testing for human papilloma virus (HPV), every 5 years. Some types of HPV increase your risk of cervical cancer. Testing for HPV may also be done on women of any age with unclear Pap test results.  Other health care providers  may not recommend any screening for nonpregnant women who are considered low risk for pelvic cancer and who do not have symptoms. Ask your health care provider if a screening pelvic exam is right for you.  If you have had past treatment for cervical cancer or a condition that could lead to cancer, you need Pap tests and screening for cancer for at least 20 years after your treatment. If Pap tests have been discontinued, your risk factors (such as having a new sexual partner) need to be reassessed to determine if screening should resume. Some women have medical problems that increase the chance of getting cervical cancer. In these cases, your health care provider may recommend more frequent screening and Pap tests.  Colorectal Cancer  This type of cancer can be detected and often prevented.  Routine colorectal cancer screening usually begins at 73 years of age and continues through  73 years of age.  Your health care provider may recommend screening at an earlier age if you have risk factors for colon cancer.  Your health care provider may also recommend using home test kits to check for hidden blood in the stool.  A small camera at the end of a tube can be used to examine your colon directly (sigmoidoscopy or colonoscopy). This is done to check for the earliest forms of colorectal cancer.  Routine screening usually begins at age 52.  Direct examination of the colon should be repeated every 5-10 years through 73 years of age. However, you may need to be screened more often if early forms of precancerous polyps or small growths are found.  Skin Cancer  Check your skin from head to toe regularly.  Tell your health care provider about any new moles or changes in moles, especially if there is a change in a mole's shape or color.  Also tell your health care provider if you have a mole that is larger than the size of a pencil eraser.  Always use sunscreen. Apply sunscreen liberally and repeatedly  throughout the day.  Protect yourself by wearing long sleeves, pants, a wide-brimmed hat, and sunglasses whenever you are outside.  Heart disease, diabetes, and high blood pressure  High blood pressure causes heart disease and increases the risk of stroke. High blood pressure is more likely to develop in: ? People who have blood pressure in the high end of the normal range (130-139/85-89 mm Hg). ? People who are overweight or obese. ? People who are African American.  If you are 25-73 years of age, have your blood pressure checked every 3-5 years. If you are 43 years of age or older, have your blood pressure checked every year. You should have your blood pressure measured twice-once when you are at a hospital or clinic, and once when you are not at a hospital or clinic. Record the average of the two measurements. To check your blood pressure when you are not at a hospital or clinic, you can use: ? An automated blood pressure machine at a pharmacy. ? A home blood pressure monitor.  If you are between 88 years and 33 years old, ask your health care provider if you should take aspirin to prevent strokes.  Have regular diabetes screenings. This involves taking a blood sample to check your fasting blood sugar level. ? If you are at a normal weight and have a low risk for diabetes, have this test once every three years after 73 years of age. ? If you are overweight and have a high risk for diabetes, consider being tested at a younger age or more often. Preventing infection Hepatitis B  If you have a higher risk for hepatitis B, you should be screened for this virus. You are considered at high risk for hepatitis B if: ? You were born in a country where hepatitis B is common. Ask your health care provider which countries are considered high risk. ? Your parents were born in a high-risk country, and you have not been immunized against hepatitis B (hepatitis B vaccine). ? You have HIV or AIDS. ? You  use needles to inject street drugs. ? You live with someone who has hepatitis B. ? You have had sex with someone who has hepatitis B. ? You get hemodialysis treatment. ? You take certain medicines for conditions, including cancer, organ transplantation, and autoimmune conditions.  Hepatitis C  Blood testing is recommended for: ?  Everyone born from [birth year] through 1966. ? Anyone with known risk factors for hepatitis C.  Sexually transmitted infections (STIs)  You should be screened for sexually transmitted infections (STIs) including gonorrhea and chlamydia if: ? You are sexually active and are younger than 73 years of age. ? You are older than 73 years of age and your health care provider tells you that you are at risk for this type of infection. ? Your sexual activity has changed since you were last screened and you are at an increased risk for chlamydia or gonorrhea. Ask your health care provider if you are at risk.  If you do not have HIV, but are at risk, it may be recommended that you take a prescription medicine daily to prevent HIV infection. This is called pre-exposure prophylaxis (PrEP). You are considered at risk if: ? You are sexually active and do not regularly use condoms or know the HIV status of your partner(s). ? You take drugs by injection. ? You are sexually active with a partner who has HIV.  Talk with your health care provider about whether you are at high risk of being infected with HIV. If you choose to begin PrEP, you should first be tested for HIV. You should then be tested every 3 months for as long as you are taking PrEP. Pregnancy  If you are premenopausal and you may become pregnant, ask your health care provider about preconception counseling.  If you may become pregnant, take 400 to 800 micrograms (mcg) of folic acid every day.  If you want to prevent pregnancy, talk to your health care provider about birth control (contraception). Osteoporosis and  menopause  Osteoporosis is a disease in which the bones lose minerals and strength with aging. This can result in serious bone fractures. Your risk for osteoporosis can be identified using a bone density scan.  If you are 45 years of age or older, or if you are at risk for osteoporosis and fractures, ask your health care provider if you should be screened.  Ask your health care provider whether you should take a calcium or vitamin D supplement to lower your risk for osteoporosis.  Menopause may have certain physical symptoms and risks.  Hormone replacement therapy may reduce some of these symptoms and risks. Talk to your health care provider about whether hormone replacement therapy is right for you. Follow these instructions at home:  Schedule regular health, dental, and eye exams.  Stay current with your immunizations.  Do not use any tobacco products including cigarettes, chewing tobacco, or electronic cigarettes.  If you are pregnant, do not drink alcohol.  If you are breastfeeding, limit how much and how often you drink alcohol.  Limit alcohol intake to no more than 1 drink per day for nonpregnant women. One drink equals 12 ounces of beer, 5 ounces of Alexanderia Gorby, or 1 ounces of hard liquor.  Do not use street drugs.  Do not share needles.  Ask your health care provider for help if you need support or information about quitting drugs.  Tell your health care provider if you often feel depressed.  Tell your health care provider if you have ever been abused or do not feel safe at home. This information is not intended to replace advice given to you by your health care provider. Make sure you discuss any questions you have with your health care provider. Document Released: 11/06/2010 Document Revised: 09/29/2015 Document Reviewed: 01/25/2015 Elsevier Interactive Patient Education  [employer].

## 2018-04-08 NOTE — Progress Notes (Signed)
Medical screening examination/treatment/procedure(s) were performed by non-physician practitioner and as supervising physician I was immediately available for consultation/collaboration. I agree with above. Elizabeth A Crawford, MD 

## 2018-04-21 ENCOUNTER — Other Ambulatory Visit (INDEPENDENT_AMBULATORY_CARE_PROVIDER_SITE_OTHER): Payer: Medicare Other

## 2018-04-21 ENCOUNTER — Ambulatory Visit (INDEPENDENT_AMBULATORY_CARE_PROVIDER_SITE_OTHER): Payer: Medicare Other | Admitting: Internal Medicine

## 2018-04-21 ENCOUNTER — Encounter: Payer: Self-pay | Admitting: Internal Medicine

## 2018-04-21 VITALS — BP 136/80 | HR 47 | Temp 97.7°F | Ht 62.0 in | Wt 194.0 lb

## 2018-04-21 DIAGNOSIS — E782 Mixed hyperlipidemia: Secondary | ICD-10-CM

## 2018-04-21 DIAGNOSIS — K219 Gastro-esophageal reflux disease without esophagitis: Secondary | ICD-10-CM

## 2018-04-21 DIAGNOSIS — I10 Essential (primary) hypertension: Secondary | ICD-10-CM

## 2018-04-21 LAB — CBC
HCT: 42.3 % (ref 36.0–46.0)
HEMOGLOBIN: 14.2 g/dL (ref 12.0–15.0)
MCHC: 33.6 g/dL (ref 30.0–36.0)
MCV: 96.8 fl (ref 78.0–100.0)
PLATELETS: 186 10*3/uL (ref 150.0–400.0)
RBC: 4.37 Mil/uL (ref 3.87–5.11)
RDW: 12.8 % (ref 11.5–15.5)
WBC: 5.9 10*3/uL (ref 4.0–10.5)

## 2018-04-21 LAB — COMPREHENSIVE METABOLIC PANEL
ALK PHOS: 52 U/L (ref 39–117)
ALT: 13 U/L (ref 0–35)
AST: 17 U/L (ref 0–37)
Albumin: 4.2 g/dL (ref 3.5–5.2)
BUN: 17 mg/dL (ref 6–23)
CHLORIDE: 101 meq/L (ref 96–112)
CO2: 33 mEq/L — ABNORMAL HIGH (ref 19–32)
Calcium: 9.8 mg/dL (ref 8.4–10.5)
Creatinine, Ser: 0.71 mg/dL (ref 0.40–1.20)
GFR: 85.75 mL/min (ref >60.00)
GLUCOSE: 98 mg/dL (ref 70–99)
POTASSIUM: 4.2 meq/L (ref 3.5–5.1)
Sodium: 140 mEq/L (ref 135–145)
TOTAL PROTEIN: 7.4 g/dL (ref 6.0–8.3)
Total Bilirubin: 0.8 mg/dL (ref 0.2–1.2)

## 2018-04-21 LAB — LIPID PANEL
Cholesterol: 172 mg/dL (ref 0–200)
HDL: 47.8 mg/dL (ref >39.00)
NONHDL: 124.18
TRIGLYCERIDES: 233 mg/dL — AB (ref 0.0–149.0)
Total CHOL/HDL Ratio: 4
VLDL: 46.6 mg/dL — AB (ref 0.0–40.0)

## 2018-04-21 LAB — LDL CHOLESTEROL, DIRECT: Direct LDL: 89 mg/dL

## 2018-04-21 MED ORDER — BENAZEPRIL HCL 20 MG PO TABS: 20.0000 mg | ORAL_TABLET | Freq: Every day | ORAL | 3 refills | Status: DC

## 2018-04-21 MED ORDER — BISOPROLOL-HYDROCHLOROTHIAZIDE 5-6.25 MG PO TABS: 1.0000 | ORAL_TABLET | Freq: Every day | ORAL | 3 refills | Status: DC

## 2018-04-21 MED ORDER — FLUTICASONE PROPIONATE 50 MCG/ACT NA SUSP: NASAL | 6 refills | Status: DC

## 2018-04-21 MED ORDER — PRAVASTATIN SODIUM 20 MG PO TABS: 20.0000 mg | ORAL_TABLET | Freq: Every day | ORAL | 3 refills | Status: DC

## 2018-04-21 NOTE — Progress Notes (Signed)
   Subjective:    Patient ID: Kim Fuller, female    DOB: 1945/12/16, 72 y.o.   MRN: [id number]  HPI The patient is a 72 YO female coming in for follow up of her blood pressure (taking benazepril and bisoprolol and hctz, BP at goal, denies side effects, denies headaches or chest pains), cholesterol (taking pravastatin 20 mg daily, denies stroke symptoms or chest pain, denies side effects), and GERD (taking nexium at times, she only takes it when she has symptoms, denies blood in stool, denies pain, tries to avoid eating late at night).   Review of Systems  Constitutional: Negative.   HENT: Negative.   Eyes: Negative.   Respiratory: Negative for cough, chest tightness and shortness of breath.   Cardiovascular: Negative for chest pain, palpitations and leg swelling.  Gastrointestinal: Negative for abdominal distention, abdominal pain, constipation, diarrhea, nausea and vomiting.  Musculoskeletal: Negative.   Skin: Negative.   Neurological: Negative.   Psychiatric/Behavioral: Negative.       Objective:   Physical Exam Constitutional:      Appearance: She is well-developed.  HENT:     Head: Normocephalic and atraumatic.  Neck:     Musculoskeletal: Normal range of motion.  Cardiovascular:     Rate and Rhythm: Normal rate and regular rhythm.  Pulmonary:     Effort: Pulmonary effort is normal. No respiratory distress.     Breath sounds: Normal breath sounds. No wheezing or rales.  Abdominal:     General: Bowel sounds are normal. There is no distension.     Palpations: Abdomen is soft.     Tenderness: There is no abdominal tenderness. There is no rebound.  Skin:    General: Skin is warm and dry.  Neurological:     Mental Status: She is alert and oriented to person, place, and time.     Coordination: Coordination normal.    Vitals:   04/21/18 0915  BP: 136/80  Pulse: (!) 47  Temp: 97.7 F (36.5 C)  TempSrc: Oral  SpO2: 93%  Weight: 194 lb (88 kg)  Height: 5\' 2"  (1.575 m)       Assessment & Plan:

## 2018-04-21 NOTE — Patient Instructions (Signed)

## 2018-04-21 NOTE — Assessment & Plan Note (Signed)
Taking benazepril (dose reduced as she is only taking half 40 mg pill and 20 mg pill is easier) and bisoprolol/hctz, checking CMP and adjust as needed.

## 2018-04-21 NOTE — Assessment & Plan Note (Signed)
Taking nexium as needed. Not daily. Can refill if needed.

## 2018-04-21 NOTE — Assessment & Plan Note (Signed)
Checking lipid panel and adjust pravastatin 20 mg daily.  

## 2018-05-09 ENCOUNTER — Other Ambulatory Visit: Payer: Self-pay | Admitting: Obstetrics

## 2018-05-09 DIAGNOSIS — Z1231 Encounter for screening mammogram for malignant neoplasm of breast: Secondary | ICD-10-CM

## 2018-06-19 ENCOUNTER — Ambulatory Visit
Admission: RE | Admit: 2018-06-19 | Discharge: 2018-06-19 | Disposition: A | Discharge status: Home / Self Care | Payer: Medicare Other | Source: Ambulatory Visit | Attending: Obstetrics | Admitting: Obstetrics

## 2018-06-19 DIAGNOSIS — Z1231 Encounter for screening mammogram for malignant neoplasm of breast: Secondary | ICD-10-CM | POA: Diagnosis not present

## 2018-07-10 DIAGNOSIS — Z961 Presence of intraocular lens: Secondary | ICD-10-CM | POA: Diagnosis not present

## 2018-12-03 DIAGNOSIS — D2261 Melanocytic nevi of right upper limb, including shoulder: Secondary | ICD-10-CM | POA: Diagnosis not present

## 2018-12-03 DIAGNOSIS — L814 Other melanin hyperpigmentation: Secondary | ICD-10-CM | POA: Diagnosis not present

## 2018-12-03 DIAGNOSIS — L821 Other seborrheic keratosis: Secondary | ICD-10-CM | POA: Diagnosis not present

## 2018-12-03 DIAGNOSIS — D2262 Melanocytic nevi of left upper limb, including shoulder: Secondary | ICD-10-CM | POA: Diagnosis not present

## 2018-12-03 DIAGNOSIS — D2271 Melanocytic nevi of right lower limb, including hip: Secondary | ICD-10-CM | POA: Diagnosis not present

## 2018-12-03 DIAGNOSIS — D225 Melanocytic nevi of trunk: Secondary | ICD-10-CM | POA: Diagnosis not present

## 2018-12-03 DIAGNOSIS — L918 Other hypertrophic disorders of the skin: Secondary | ICD-10-CM | POA: Diagnosis not present

## 2019-01-30 ENCOUNTER — Other Ambulatory Visit: Payer: Self-pay

## 2019-01-30 ENCOUNTER — Encounter: Payer: Self-pay | Admitting: Family Medicine

## 2019-01-30 ENCOUNTER — Ambulatory Visit (INDEPENDENT_AMBULATORY_CARE_PROVIDER_SITE_OTHER): Payer: Medicare Other | Admitting: Family Medicine

## 2019-01-30 ENCOUNTER — Ambulatory Visit: Payer: Self-pay

## 2019-01-30 VITALS — BP 142/70 | HR 68 | Ht 62.0 in | Wt 216.0 lb

## 2019-01-30 DIAGNOSIS — M65331 Trigger finger, right middle finger: Secondary | ICD-10-CM

## 2019-01-30 DIAGNOSIS — M79644 Pain in right finger(s): Secondary | ICD-10-CM | POA: Diagnosis not present

## 2019-01-30 NOTE — Assessment & Plan Note (Signed)
Repeat injection given.  Discussed icing regimen and home exercises, which activities to do which wants to avoid.  Patient will increase activity slowly over the course the next several weeks.  Follow-up again in 4 to 8 weeks if necessary otherwise can repeat the injections every 3 months.  Last injection 1 year of improvement.

## 2019-01-30 NOTE — Progress Notes (Signed)
[name] [address], [place] [address] Phone: [phone] Subjective:   I Kim Fuller am serving as a Education administrator for Dr. [name].  I'm seeing this patient by the request  of:    CC: Finger pain follow-up  [id number]   01/13/2018 Repeat injection given today.  7 months since previous injection.  Discussed bracing at night and topical anti-inflammatories.  Follow-up again in 4 weeks for further evaluation  Update [id number] Kim Fuller is a 73 y.o. female coming in with complaint of right middle finger pain. States the finger is painful. Worse first thing in the morning.  Middle finger.  Patient was seen a year ago. Starting to trigger again.  Rates the severity of pain when it occurs is 8 out of 10     Past Medical History:  Diagnosis Date  . Abnormal finding on Pap smear    x1  . Diverticulosis   . GERD (gastroesophageal reflux disease)   . Hyperlipidemia   . Hypertension   . Osteopenia    -1.5 @ femoral neck 11/2007  . Reactive airway disease    years ago ; severe asthma attack "bornchitis asthma " per patient, no issues since    Past Surgical History:  Procedure Laterality Date  . CARDIOVASCULAR STRESS TEST  03/05/06  . CARPAL TUNNEL RELEASE  2009    bilaterally  . CATARACT EXTRACTION, BILATERAL     Dr Gershon Crane  . COLONOSCOPY W/ POLYPECTOMY  1998   Tics @ 2004 & 2012; Dr Carlean Purl  . HAND SURGERY     Trigger thumb  . PARTIAL KNEE ARTHROPLASTY Left 04/03/2017   Procedure: Left knee medial unicompartmental arthroplasty;  Surgeon: Gaynelle Arabian, MD;  Location: [facility];  Service: Orthopedics;  Laterality: Left;  Adductor canal block  . TUBAL LIGATION     Social History   Socioeconomic History  . Marital status: Married    Spouse name: Not on file  . Number of children: 2  . Years of education: Not on file  . Highest education level: Not on file  Occupational History  . Occupation: retired  Scientific laboratory technician  .  Financial resource strain: Not hard at all  . Food insecurity    Worry: Never true    Inability: Never true  . Transportation needs    Medical: No    Non-medical: No  Tobacco Use  . Smoking status: Former Smoker    Quit date: 05/07/1970    Years since quitting: 48.7  . Smokeless tobacco: Never Used  . Tobacco comment: Smoked as a teen  1962-1972,only up to 3 cigarettes/ day  Substance and Sexual Activity  . Alcohol use: Yes    Comment: occas  . Drug use: No  . Sexual activity: Not Currently  Lifestyle  . Physical activity    Days per week: 5 days    Minutes per session: 50 min  . Stress: Not at all  Relationships  . Social connections    Talks on phone: More than three times a week    Gets together: More than three times a week    Attends religious service: More than 4 times per year    Active member of club or organization: Not on file    Attends meetings of clubs or organizations: More than 4 times per year    Relationship status: Married  Other Topics Concern  . Not on file  Social History Narrative  . Not on file  Allergies  Allergen Reactions  . Sulfonamide Derivatives     Rash Because of a history of documented adverse serious drug reaction;Medi Alert bracelet  is recommended  . Tramadol Hcl     REACTION: cold sweats, weak, fatigue   Family History  Problem Relation Age of Onset  . Asthma Sister   . Hypertension Sister   . Hypertension Father   . Heart attack Father        MI > 77  . Prostate cancer Father   . Lung cancer Sister        smoker  . Melanoma Sister   . Osteoporosis Sister   . Glaucoma Sister        X 2  . Diverticulosis Sister        2 sisters  . Lung cancer Sister   . Colon polyps Sister        2 sisters with polyps  . Diverticulitis Sister        1 sister  . Hypertension Mother   . Hypertension Brother   . Skin cancer Sister   . Diabetes Neg Hx   . Stroke Neg Hx   . Breast cancer Neg Hx      Current Outpatient Medications  (Cardiovascular):  .  benazepril (LOTENSIN) 20 MG tablet, Take 1 tablet (20 mg total) by mouth daily. .  bisoprolol-hydrochlorothiazide (ZIAC) 5-6.25 MG tablet, Take 1 tablet by mouth daily. .  pravastatin (PRAVACHOL) 20 MG tablet, Take 1 tablet (20 mg total) by mouth daily.  Current Outpatient Medications (Respiratory):  .  fluticasone (FLONASE) 50 MCG/ACT nasal spray, USE TWO SPRAY(S) IN EACH NOSTRIL DAILY  Current Outpatient Medications (Analgesics):  .  aspirin 81 MG chewable tablet, Chew 81 mg by mouth daily.   Current Outpatient Medications (Other):  .  calcium carbonate (OSCAL) 1500 (600 Ca) MG TABS tablet, Take 600 mg of elemental calcium by mouth 2 (two) times daily with a meal. .  cholecalciferol (VITAMIN D3) 10 MCG (400 UNIT) TABS tablet, Take 400 Units by mouth. .  esomeprazole (NEXIUM) 10 MG packet, Take 10 mg by mouth daily before breakfast. .  Multiple Vitamins-Minerals (CENTRUM SILVER ULTRA WOMENS PO), Take 1 tablet by mouth daily. .  Omega-3 Fatty Acids (FISH OIL) 1000 MG CAPS, Take 1,000 mg by mouth 2 (two) times daily. Marland Kitchen  Propylene Glycol (SYSTANE BALANCE) 0.6 % SOLN, Place 1 drop 3 (three) times daily as needed into both eyes (dry eyes). .  vitamin E 400 UNIT capsule, Take 400 Units by mouth daily.    Past medical history, social, surgical and family history all reviewed in electronic medical record.  No pertanent information unless stated regarding to the chief complaint.   Review of Systems:  No headache, visual changes, nausea, vomiting, diarrhea, constipation, dizziness, abdominal pain, skin rash, fevers, chills, night sweats, weight loss, swollen lymph nodes, body aches, joint swelling, muscle aches, chest pain, shortness of breath, mood changes.   Objective  Blood pressure (!) 142/70, pulse 68, height 5\' 2"  (1.575 m), weight 216 lb (98 kg), SpO2 93 %. Systems examined below as of    General: No apparent distress alert and oriented x3 mood and affect normal,  dressed appropriately.  HEENT: Pupils equal, extraocular movements intact  Respiratory: Patient's speak in full sentences and does not appear short of breath  Cardiovascular: No lower extremity edema, non tender, no erythema  Skin: Warm dry intact with no signs of infection or rash on extremities or on axial skeleton.  Abdomen: Soft nontender  Neuro: Cranial nerves II through XII are intact, neurovascularly intact in all extremities with 2+ DTRs and 2+ pulses.  Lymph: No lymphadenopathy of posterior or anterior cervical chain or axillae bilaterally.  Gait normal with good balance and coordination.  MSK:  Non tender with full range of motion and good stability and symmetric strength and tone of shoulders, elbows, wrist, hip, knee and ankles bilaterally.  Mild arthritic changes of multiple joints  Right hand patient does have triggering of the A2 pulley.  Seems to be tender to palpation.  Mild nodule noted that is severely tender. Does have full range of motion though.  Procedure: Real-time Ultrasound Guided Injection of right middle flexor tendon sheath Device: GE Logiq Q7 Ultrasound guided injection is preferred based studies that show increased duration, increased effect, greater accuracy, decreased procedural pain, increased response rate, and decreased cost with ultrasound guided versus blind injection.  Verbal informed consent obtained.  Time-out conducted.  Noted no overlying erythema, induration, or other signs of local infection.  Skin prepped in a sterile fashion.  Local anesthesia: Topical Ethyl chloride.  With sterile technique and under real time ultrasound guidance: With a 25-gauge half inch needle injected with 0.5 cc of 0.5% Marcaine and 0.5 cc of Kenalog 40 mg/mL Completed without difficulty  Pain immediately resolved suggesting accurate placement of the medication.  Advised to call if fevers/chills, erythema, induration, drainage, or persistent bleeding.  Images permanently  stored and available for review in the ultrasound unit.  Impression: Technically successful ultrasound guided injection.    Impression and Recommendations:     This case required medical decision making of moderate complexity. The above documentation has been reviewed and is accurate and complete Lyndal Pulley, DO       Note: This dictation was prepared with Dragon dictation along with smaller phrase technology. Any transcriptional errors that result from this process are unintentional.

## 2019-01-30 NOTE — Patient Instructions (Signed)
Good to see you   

## 2019-02-04 DIAGNOSIS — Z23 Encounter for immunization: Secondary | ICD-10-CM | POA: Diagnosis not present

## 2019-03-31 ENCOUNTER — Other Ambulatory Visit: Payer: Self-pay

## 2019-03-31 DIAGNOSIS — Z01419 Encounter for gynecological examination (general) (routine) without abnormal findings: Secondary | ICD-10-CM | POA: Diagnosis not present

## 2019-04-10 NOTE — Progress Notes (Signed)
Subjective:   Kim Fuller is a 73 y.o. female who presents for Medicare Annual (Subsequent) preventive examination.  This visit occurred during the SARS-CoV-2 public health emergency.  Safety protocols were in place, including screening questions prior to the visit, additional usage of staff PPE, and extensive cleaning of exam room while observing appropriate contact time as indicated for disinfecting solutions.   Review of Systems:   Cardiac Risk Factors include: advanced age (>3men, >15 women);obesity (BMI >30kg/m2) Sleep patterns: feels rested on waking, gets up 0-2 times nightly to void and sleeps 6-7 hours nightly.    Home Safety/Smoke Alarms: Feels safe in home. Smoke alarms in place.  Living environment; residence and Firearm Safety: 2-story house. Lives with husband, no needs for DME, good support system Seat Belt Safety/Bike Helmet: Wears seat belt.      Objective:     Vitals: BP (!) 142/88   Pulse (!) 54   Resp 17   Ht 5\' 2"  (1.575 m)   Wt 217 lb (98.4 kg)   LMP  (LMP Unknown)   SpO2 97%   BMI 39.69 kg/m   Body mass index is 39.69 kg/m.  Advanced Directives 04/14/2019 04/02/2018 04/03/2017 03/27/2017 03/27/2017  Does Patient Have a Medical Advance Directive? No No No No No  Would patient like information on creating a medical advance directive? No - Patient declined No - Patient declined No - Patient declined No - Patient declined Yes (ED - Information included in AVS)    Tobacco Social History   Tobacco Use  Smoking Status Former Smoker  . Quit date: 05/07/1970  . Years since quitting: 48.9  Smokeless Tobacco Never Used  Tobacco Comment   Smoked as a teen  1962-1972,only up to 3 cigarettes/ day     Counseling given: Not Answered Comment: Smoked as a teen  1962-1972,only up to 3 cigarettes/ day  Past Medical History:  Diagnosis Date  . Abnormal finding on Pap smear    x1  . Diverticulosis   . GERD (gastroesophageal reflux disease)   . Hyperlipidemia    . Hypertension   . Osteopenia    -1.5 @ femoral neck 11/2007  . Reactive airway disease    years ago ; severe asthma attack "bornchitis asthma " per patient, no issues since    Past Surgical History:  Procedure Laterality Date  . CARDIOVASCULAR STRESS TEST  03/05/06  . CARPAL TUNNEL RELEASE  2009    bilaterally  . CATARACT EXTRACTION, BILATERAL     Dr Gershon Crane  . COLONOSCOPY W/ POLYPECTOMY  1998   Tics @ 2004 & 2012; Dr Carlean Purl  . HAND SURGERY     Trigger thumb  . PARTIAL KNEE ARTHROPLASTY Left 04/03/2017   Procedure: Left knee medial unicompartmental arthroplasty;  Surgeon: Gaynelle Arabian, MD;  Location: [facility];  Service: Orthopedics;  Laterality: Left;  Adductor canal block  . TRIGGER FINGER RELEASE Right   . TUBAL LIGATION     Family History  Problem Relation Age of Onset  . Asthma Sister   . Hypertension Sister   . Hypertension Father   . Heart attack Father        MI > 31  . Prostate cancer Father   . Lung cancer Sister        smoker  . Melanoma Sister   . Osteoporosis Sister   . Glaucoma Sister        X 2  . Diverticulosis Sister        2 sisters  .  Lung cancer Sister   . Colon polyps Sister        2 sisters with polyps  . Diverticulitis Sister        1 sister  . Hypertension Mother   . Hypertension Brother   . Skin cancer Sister   . Diabetes Neg Hx   . Stroke Neg Hx   . Breast cancer Neg Hx    Social History   Socioeconomic History  . Marital status: Married    Spouse name: Not on file  . Number of children: 2  . Years of education: Not on file  . Highest education level: Not on file  Occupational History  . Occupation: retired  Scientific laboratory technician  . Financial resource strain: Not hard at all  . Food insecurity    Worry: Never true    Inability: Never true  . Transportation needs    Medical: No    Non-medical: No  Tobacco Use  . Smoking status: Former Smoker    Quit date: 05/07/1970    Years since quitting: 48.9  . Smokeless tobacco: Never Used   . Tobacco comment: Smoked as a teen  1962-1972,only up to 3 cigarettes/ day  Substance and Sexual Activity  . Alcohol use: Yes    Comment: occas  . Drug use: No  . Sexual activity: Not Currently  Lifestyle  . Physical activity    Days per week: 0 days    Minutes per session: 0 min  . Stress: Not at all  Relationships  . Social connections    Talks on phone: More than three times a week    Gets together: More than three times a week    Attends religious service: More than 4 times per year    Active member of club or organization: Not on file    Attends meetings of clubs or organizations: More than 4 times per year    Relationship status: Married  Other Topics Concern  . Not on file  Social History Narrative  . Not on file    Outpatient Encounter Medications as of 04/14/2019  Medication Sig  . acetaminophen (TYLENOL) 325 MG tablet Take 650 mg by mouth every 6 (six) hours as needed.  Marland Kitchen aspirin 81 MG chewable tablet Chew 81 mg by mouth daily.  . benazepril (LOTENSIN) 20 MG tablet Take 1 tablet (20 mg total) by mouth daily.  . bisoprolol-hydrochlorothiazide (ZIAC) 5-6.25 MG tablet Take 1 tablet by mouth daily.  . calcium carbonate (OSCAL) 1500 (600 Ca) MG TABS tablet Take 600 mg of elemental calcium by mouth 2 (two) times daily with a meal.  . cholecalciferol (VITAMIN D3) 10 MCG (400 UNIT) TABS tablet Take 400 Units by mouth.  . esomeprazole (NEXIUM) 10 MG packet Take 10 mg by mouth daily before breakfast.  . fluticasone (FLONASE) 50 MCG/ACT nasal spray USE TWO SPRAY(S) IN EACH NOSTRIL DAILY  . ibuprofen (ADVIL) 200 MG tablet Take 200 mg by mouth every 6 (six) hours as needed.  . Multiple Vitamins-Minerals (CENTRUM SILVER ULTRA WOMENS PO) Take 1 tablet by mouth daily.  . Omega-3 Fatty Acids (FISH OIL) 1000 MG CAPS Take 1,000 mg by mouth 2 (two) times daily.  . pravastatin (PRAVACHOL) 20 MG tablet Take 1 tablet (20 mg total) by mouth daily.  Marland Kitchen Propylene Glycol (SYSTANE BALANCE) 0.6  % SOLN Place 1 drop 3 (three) times daily as needed into both eyes (dry eyes).  . vitamin E 400 UNIT capsule Take 400 Units by mouth daily.  No facility-administered encounter medications on file as of 04/14/2019.     Activities of Daily Living In your present state of health, do you have any difficulty performing the following activities: 04/14/2019  Hearing? N  Vision? N  Difficulty concentrating or making decisions? N  Walking or climbing stairs? N  Dressing or bathing? N  Doing errands, shopping? N  Preparing Food and eating ? N  Using the Toilet? N  In the past six months, have you accidently leaked urine? N  Do you have problems with loss of bowel control? N  Managing your Medications? N  Managing your Finances? N  Housekeeping or managing your Housekeeping? N  Some recent data might be hidden    Patient Care Team: Hoyt Koch, MD as PCP - General (Internal Medicine)    Assessment:   This is a routine wellness examination for Nadiah. Physical assessment deferred to PCP.  Exercise Activities and Dietary recommendations Current Exercise Habits: The patient does not participate in regular exercise at present, Exercise limited by: orthopedic condition(s) Senior exercise TV program resource was provided  Diet (meal preparation, eat out, water intake, caffeinated beverages, dairy products, fruits and vegetables): in general, a "healthy" diet  , well balanced.   Reviewed heart healthy diet. Encouraged patient to increase daily water and healthy fluid intake.  Goals    . patient stated     Increase water intake, I will set out water so I can see it to remind me to drink. Exercise more to help with weight loss.    . Patient Stated     I want to continue to stay on the [employer] program, exercise, stay socially active.       Fall Risk Fall Risk  04/14/2019 04/02/2018 03/27/2017 03/25/2017 11/23/2016  Falls in the past year? 0 0 No No No  Comment - - - - Emmi Telephone  Survey: data to providers prior to load  Number falls in past yr: 0 - - - -  Injury with Fall? 0 - - - -  Risk for fall due to : Impaired balance/gait - - - -   Is the patient's home free of loose throw rugs in walkways, pet beds, electrical cords, etc?   yes      Grab bars in the bathroom? yes      Handrails on the stairs?   yes      Adequate lighting?   yes   Depression Screen PHQ 2/9 Scores 04/14/2019 04/02/2018 03/27/2017 03/25/2017  PHQ - 2 Score 0 0 0 0  PHQ- 9 Score - - 0 -     Cognitive Function MMSE - Mini Mental State Exam 03/27/2017  Orientation to time 5  Orientation to Place 5  Registration 3  Attention/ Calculation 5  Recall 2  Language- name 2 objects 2  Language- repeat 1  Language- follow 3 step command 3  Language- read & follow direction 1  Write a sentence 1  Copy design 1  Total score 29       Ad8 score reviewed for issues:  Issues making decisions: no  Less interest in hobbies / activities: no  Repeats questions, stories (family complaining): no  Trouble using ordinary gadgets (microwave, computer, phone):no  Forgets the month or year: no  Mismanaging finances: no  Remembering appts: no  Daily problems with thinking and/or memory: no Ad8 score is= 0  Immunization History  Administered Date(s) Administered  . Influenza Whole 02/05/2003  . Influenza, High Dose Seasonal  PF 02/05/2014, 02/04/2016, 02/25/2018  . Influenza,inj,Quad PF,6+ Mos 02/17/2015  . Influenza-Unspecified 03/07/2013, 02/11/2017  . Pneumococcal Conjugate-13 03/12/2015  . Pneumococcal Polysaccharide-23 03/20/2007, 01/29/2013  . Td 05/08/1999, 06/09/2009  . Zoster 03/03/2014   Screening Tests Health Maintenance  Topic Date Due  . TETANUS/TDAP  06/10/2019  . MAMMOGRAM  06/19/2020  . COLONOSCOPY  07/26/2020  . INFLUENZA VACCINE  Completed  . DEXA SCAN  Completed  . Hepatitis C Screening  Completed  . PNA vac Low Risk Adult  Completed      Plan:    Reviewed  health maintenance screenings with patient today and relevant education, vaccines, and/or referrals were provided.   I have personally reviewed and noted the following in the patient's chart:   . Medical and social history . Use of alcohol, tobacco or illicit drugs  . Current medications and supplements . Functional ability and status . Nutritional status . Physical activity . Advanced directives . List of other physicians . Vitals . Screenings to include cognitive, depression, and falls . Referrals and appointments  In addition, I have reviewed and discussed with patient certain preventive protocols, quality metrics, and best practice recommendations. A written personalized care plan for preventive services as well as general preventive health recommendations were provided to patient.     Michiel Cowboy, RN  04/14/2019

## 2019-04-14 ENCOUNTER — Ambulatory Visit (INDEPENDENT_AMBULATORY_CARE_PROVIDER_SITE_OTHER): Payer: Medicare Other | Admitting: *Deleted

## 2019-04-14 ENCOUNTER — Other Ambulatory Visit: Payer: Self-pay

## 2019-04-14 VITALS — BP 142/88 | HR 54 | Resp 17 | Ht 62.0 in | Wt 217.0 lb

## 2019-04-14 DIAGNOSIS — Z0000 Encounter for general adult medical examination without abnormal findings: Secondary | ICD-10-CM

## 2019-04-14 NOTE — Progress Notes (Signed)
Medical screening examination/treatment/procedure(s) were performed by non-physician practitioner and as supervising physician I was immediately available for consultation/collaboration. I agree with above. Elizabeth A Crawford, MD 

## 2019-04-14 NOTE — Patient Instructions (Addendum)
If you cannot attend class in person, you can still exercise at home. Video taped versions of [facility] classes are shown on [employer] (GTN) at 8 am and 1 pm Mondays through Fridays. You can also purchase a copy of the [facility] DVD by calling [place] (GTN) [employer]. GTN is available on [employer] channel 13 with a digital cable box and on [employer] channel 31. GTN is also available on [employer], channel 99. To view GTN, go to channel 99, press OK, select [place], then select GTN to start the channel.   Continue to eat heart healthy diet (full of fruits, vegetables, whole grains, lean protein, water--limit salt, fat, and sugar intake) and increase physical activity as tolerated.  Continue doing brain stimulating activities (puzzles, reading, adult coloring books, staying active) to keep memory sharp.    Kim Fuller , Thank you for taking time to come for your Medicare Wellness Visit. I appreciate your ongoing commitment to your health goals. Please review the following plan we discussed and let me know if I can assist you in the future.   These are the goals we discussed: Goals    . patient stated     Increase water intake, I will set out water so I can see it to remind me to drink. Exercise more to help with weight loss.    . Patient Stated     I want to continue to stay on the [facility] program, exercise, stay socially active.       This is a list of the screening recommended for you and due dates:  Health Maintenance  Topic Date Due  . Tetanus Vaccine  06/10/2019  . Mammogram  06/19/2020  . Colon Cancer Screening  07/26/2020  . Flu Shot  Completed  . DEXA scan (bone density measurement)  Completed  .  Hepatitis C: One time screening is recommended by Center for Disease Control  (CDC) for  adults born from [birth year] through 1965.   Completed  . Pneumonia vaccines  Completed    Preventive Care 42 Years and Older, Female Preventive care  refers to lifestyle choices and visits with your health care provider that can promote health and wellness. This includes:  A yearly physical exam. This is also called an annual well check.  Regular dental and eye exams.  Immunizations.  Screening for certain conditions.  Healthy lifestyle choices, such as diet and exercise. What can I expect for my preventive care visit? Physical exam Your health care provider will check:  Height and weight. These may be used to calculate body mass index (BMI), which is a measurement that tells if you are at a healthy weight.  Heart rate and blood pressure.  Your skin for abnormal spots. Counseling Your health care provider may ask you questions about:  Alcohol, tobacco, and drug use.  Emotional well-being.  Home and relationship well-being.  Sexual activity.  Eating habits.  History of falls.  Memory and ability to understand (cognition).  Work and work Statistician.  Pregnancy and menstrual history. What immunizations do I need?  Influenza (flu) vaccine  This is recommended every year. Tetanus, diphtheria, and pertussis (Tdap) vaccine  You may need a Td booster every 10 years. Varicella (chickenpox) vaccine  You may need this vaccine if you have not already been vaccinated. Zoster (shingles) vaccine  You may need this after age 47. Pneumococcal conjugate (PCV13) vaccine  One dose is recommended after age 78. Pneumococcal polysaccharide (PPSV23) vaccine  One  dose is recommended after age 48. Measles, mumps, and rubella (MMR) vaccine  You may need at least one dose of MMR if you were born in 1957 or later. You may also need a second dose. Meningococcal conjugate (MenACWY) vaccine  You may need this if you have certain conditions. Hepatitis A vaccine  You may need this if you have certain conditions or if you travel or work in places where you may be exposed to hepatitis A. Hepatitis B vaccine  You may need this  if you have certain conditions or if you travel or work in places where you may be exposed to hepatitis B. Haemophilus influenzae type b (Hib) vaccine  You may need this if you have certain conditions. You may receive vaccines as individual doses or as more than one vaccine together in one shot (combination vaccines). Talk with your health care provider about the risks and benefits of combination vaccines. What tests do I need? Blood tests  Lipid and cholesterol levels. These may be checked every 5 years, or more frequently depending on your overall health.  Hepatitis C test.  Hepatitis B test. Screening  Lung cancer screening. You may have this screening every year starting at age 59 if you have a 30-pack-year history of smoking and currently smoke or have quit within the past 15 years.  Colorectal cancer screening. All adults should have this screening starting at age 32 and continuing until age 76. Your health care provider may recommend screening at age 14 if you are at increased risk. You will have tests every 1-10 years, depending on your results and the type of screening test.  Diabetes screening. This is done by checking your blood sugar (glucose) after you have not eaten for a while (fasting). You may have this done every 1-3 years.  Mammogram. This may be done every 1-2 years. Talk with your health care provider about how often you should have regular mammograms.  BRCA-related cancer screening. This may be done if you have a family history of breast, ovarian, tubal, or peritoneal cancers. Other tests  Sexually transmitted disease (STD) testing.  Bone density scan. This is done to screen for osteoporosis. You may have this done starting at age 47. Follow these instructions at home: Eating and drinking  Eat a diet that includes fresh fruits and vegetables, whole grains, lean protein, and low-fat dairy products. Limit your intake of foods with high amounts of sugar, saturated  fats, and salt.  Take vitamin and mineral supplements as recommended by your health care provider.  Do not drink alcohol if your health care provider tells you not to drink.  If you drink alcohol: ? Limit how much you have to 0-1 drink a day. ? Be aware of how much alcohol is in your drink. In the [place] one drink equals one 12 oz bottle of beer (355 mL), one 5 oz glass of Keylen Uzelac (148 mL), or one 1 oz glass of hard liquor (44 mL). Lifestyle  Take daily care of your teeth and gums.  Stay active. Exercise for at least 30 minutes on 5 or more days each week.  Do not use any products that contain nicotine or tobacco, such as cigarettes, e-cigarettes, and chewing tobacco. If you need help quitting, ask your health care provider.  If you are sexually active, practice safe sex. Use a condom or other form of protection in order to prevent STIs (sexually transmitted infections).  Talk with your health care provider about taking a low-dose  aspirin or statin. What's next?  Go to your health care provider once a year for a well check visit.  Ask your health care provider how often you should have your eyes and teeth checked.  Stay up to date on all vaccines. This information is not intended to replace advice given to you by your health care provider. Make sure you discuss any questions you have with your health care provider. Document Released: 05/20/2015 Document Revised: 04/17/2018 Document Reviewed: 04/17/2018 Elsevier Patient Education  2020 [employer].

## 2019-04-24 ENCOUNTER — Encounter: Payer: Self-pay | Admitting: Internal Medicine

## 2019-04-24 ENCOUNTER — Other Ambulatory Visit: Payer: Self-pay

## 2019-04-24 ENCOUNTER — Other Ambulatory Visit (INDEPENDENT_AMBULATORY_CARE_PROVIDER_SITE_OTHER): Payer: Medicare Other

## 2019-04-24 ENCOUNTER — Ambulatory Visit (INDEPENDENT_AMBULATORY_CARE_PROVIDER_SITE_OTHER): Payer: Medicare Other | Admitting: Internal Medicine

## 2019-04-24 VITALS — BP 130/80 | HR 54 | Temp 98.0°F | Ht 62.0 in | Wt 219.0 lb

## 2019-04-24 DIAGNOSIS — R7301 Impaired fasting glucose: Secondary | ICD-10-CM

## 2019-04-24 DIAGNOSIS — K219 Gastro-esophageal reflux disease without esophagitis: Secondary | ICD-10-CM

## 2019-04-24 DIAGNOSIS — M8580 Other specified disorders of bone density and structure, unspecified site: Secondary | ICD-10-CM

## 2019-04-24 DIAGNOSIS — E782 Mixed hyperlipidemia: Secondary | ICD-10-CM | POA: Diagnosis not present

## 2019-04-24 DIAGNOSIS — I10 Essential (primary) hypertension: Secondary | ICD-10-CM | POA: Diagnosis not present

## 2019-04-24 LAB — COMPREHENSIVE METABOLIC PANEL
ALT: 23 U/L (ref 0–35)
AST: 22 U/L (ref 0–37)
Albumin: 4.2 g/dL (ref 3.5–5.2)
Alkaline Phosphatase: 56 U/L (ref 39–117)
BUN: 18 mg/dL (ref 6–23)
CO2: 32 mEq/L (ref 19–32)
Calcium: 10 mg/dL (ref 8.4–10.5)
Chloride: 102 mEq/L (ref 96–112)
Creatinine, Ser: 0.78 mg/dL (ref 0.40–1.20)
GFR: 72.18 mL/min (ref >60.00)
Glucose, Bld: 93 mg/dL (ref 70–99)
Potassium: 4.4 mEq/L (ref 3.5–5.1)
Sodium: 139 mEq/L (ref 135–145)
Total Bilirubin: 0.6 mg/dL (ref 0.2–1.2)
Total Protein: 7.4 g/dL (ref 6.0–8.3)

## 2019-04-24 LAB — CBC
HCT: 41.8 % (ref 36.0–46.0)
Hemoglobin: 14.1 g/dL (ref 12.0–15.0)
MCHC: 33.7 g/dL (ref 30.0–36.0)
MCV: 98.7 fl (ref 78.0–100.0)
Platelets: 181 10*3/uL (ref 150.0–400.0)
RBC: 4.24 Mil/uL (ref 3.87–5.11)
RDW: 12.6 % (ref 11.5–15.5)
WBC: 5.9 10*3/uL (ref 4.0–10.5)

## 2019-04-24 LAB — LIPID PANEL
Cholesterol: 183 mg/dL (ref 0–200)
HDL: 49 mg/dL (ref >39.00)
NonHDL: 134.39
Total CHOL/HDL Ratio: 4
Triglycerides: 242 mg/dL — ABNORMAL HIGH (ref 0.0–149.0)
VLDL: 48.4 mg/dL — ABNORMAL HIGH (ref 0.0–40.0)

## 2019-04-24 LAB — LDL CHOLESTEROL, DIRECT: Direct LDL: 86 mg/dL

## 2019-04-24 LAB — HEMOGLOBIN A1C: Hgb A1c MFr Bld: 5.7 % (ref 4.6–6.5)

## 2019-04-24 MED ORDER — PRAVASTATIN SODIUM 20 MG PO TABS: 20.0000 mg | ORAL_TABLET | Freq: Every day | ORAL | 3 refills | Status: DC

## 2019-04-24 MED ORDER — BISOPROLOL-HYDROCHLOROTHIAZIDE 5-6.25 MG PO TABS: 1.0000 | ORAL_TABLET | Freq: Every day | ORAL | 3 refills | Status: DC

## 2019-04-24 MED ORDER — BENAZEPRIL HCL 40 MG PO TABS: 20.0000 mg | ORAL_TABLET | Freq: Every day | ORAL | 3 refills | Status: DC

## 2019-04-24 NOTE — Progress Notes (Signed)
   Subjective:   Patient ID: Kim Fuller, female    DOB: 1945/11/19, 73 y.o.   MRN: [id number]  HPI The patient is a 73 YO female coming in for follow up blood pressure (taking benazepril and bisoprolol/hctz and BP at goal, denies side effects, denies chest pains or headaches) and cholesterol (taking pravastatin 20 mg daily, denies chest pains or stroke symptoms, denies side effects) and osteopenia (last DEXA 2017, not doing weight bearing exercise at this time, denies any new fractures).   Review of Systems  Constitutional: Negative.   HENT: Negative.   Eyes: Negative.   Respiratory: Negative for cough, chest tightness and shortness of breath.   Cardiovascular: Negative for chest pain, palpitations and leg swelling.  Gastrointestinal: Negative for abdominal distention, abdominal pain, constipation, diarrhea, nausea and vomiting.  Musculoskeletal: Negative.   Skin: Negative.   Neurological: Negative.   Psychiatric/Behavioral: Negative.     Objective:  Physical Exam Constitutional:      Appearance: She is well-developed.  HENT:     Head: Normocephalic and atraumatic.  Cardiovascular:     Rate and Rhythm: Normal rate and regular rhythm.  Pulmonary:     Effort: Pulmonary effort is normal. No respiratory distress.     Breath sounds: Normal breath sounds. No wheezing or rales.  Abdominal:     General: Bowel sounds are normal. There is no distension.     Palpations: Abdomen is soft.     Tenderness: There is no abdominal tenderness. There is no rebound.  Musculoskeletal:     Cervical back: Normal range of motion.  Skin:    General: Skin is warm and dry.  Neurological:     Mental Status: She is alert and oriented to person, place, and time.     Coordination: Coordination normal.     Vitals:   04/24/19 0849  BP: 130/80  Pulse: (!) 54  Temp: 98 F (36.7 C)  TempSrc: Oral  SpO2: 96%  Weight: 219 lb (99.3 kg)  Height: 5\' 2"  (1.575 m)    This visit occurred during the  SARS-CoV-2 public health emergency.  Safety protocols were in place, including screening questions prior to the visit, additional usage of staff PPE, and extensive cleaning of exam room while observing appropriate contact time as indicated for disinfecting solutions.   Assessment & Plan:

## 2019-04-24 NOTE — Assessment & Plan Note (Signed)
BMI 40.06. Weight is up about 15 pounds since last year. She is not exercising due to gyms closed. Advised to start back when safe.

## 2019-04-24 NOTE — Assessment & Plan Note (Signed)
Takes rare nexium otc.

## 2019-04-24 NOTE — Assessment & Plan Note (Signed)
Due for DEXA and we decide to wait until pandemic resolved or she has vaccine. Counseled about weight bearing exercise like walking to help.

## 2019-04-24 NOTE — Assessment & Plan Note (Signed)
Checking lipid panel and adjust pravastatin 20 mg daily as needed. 

## 2019-04-24 NOTE — Patient Instructions (Signed)
Health Maintenance, Female Adopting a healthy lifestyle and getting preventive care are important in promoting health and wellness. Ask your health care provider about:  The right schedule for you to have regular tests and exams.  Things you can do on your own to prevent diseases and keep yourself healthy. What should I know about diet, weight, and exercise? Eat a healthy diet   Eat a diet that includes plenty of vegetables, fruits, low-fat dairy products, and lean protein.  Do not eat a lot of foods that are high in solid fats, added sugars, or sodium. Maintain a healthy weight Body mass index (BMI) is used to identify weight problems. It estimates body fat based on height and weight. Your health care provider can help determine your BMI and help you achieve or maintain a healthy weight. Get regular exercise Get regular exercise. This is one of the most important things you can do for your health. Most adults should:  Exercise for at least 150 minutes each week. The exercise should increase your heart rate and make you sweat (moderate-intensity exercise).  Do strengthening exercises at least twice a week. This is in addition to the moderate-intensity exercise.  Spend less time sitting. Even light physical activity can be beneficial. Watch cholesterol and blood lipids Have your blood tested for lipids and cholesterol at 74 years of age, then have this test every 5 years. Have your cholesterol levels checked more often if:  Your lipid or cholesterol levels are high.  You are older than 74 years of age.  You are at high risk for heart disease. What should I know about cancer screening? Depending on your health history and family history, you may need to have cancer screening at various ages. This may include screening for:  Breast cancer.  Cervical cancer.  Colorectal cancer.  Skin cancer.  Lung cancer. What should I know about heart disease, diabetes, and high blood  pressure? Blood pressure and heart disease  High blood pressure causes heart disease and increases the risk of stroke. This is more likely to develop in people who have high blood pressure readings, are of African descent, or are overweight.  Have your blood pressure checked: ? Every 3-5 years if you are 18-39 years of age. ? Every year if you are 40 years old or older. Diabetes Have regular diabetes screenings. This checks your fasting blood sugar level. Have the screening done:  Once every three years after age 40 if you are at a normal weight and have a low risk for diabetes.  More often and at a younger age if you are overweight or have a high risk for diabetes. What should I know about preventing infection? Hepatitis B If you have a higher risk for hepatitis B, you should be screened for this virus. Talk with your health care provider to find out if you are at risk for hepatitis B infection. Hepatitis C Testing is recommended for:  Everyone born from 1945 through 1965.  Anyone with known risk factors for hepatitis C. Sexually transmitted infections (STIs)  Get screened for STIs, including gonorrhea and chlamydia, if: ? You are sexually active and are younger than 74 years of age. ? You are older than 74 years of age and your health care provider tells you that you are at risk for this type of infection. ? Your sexual activity has changed since you were last screened, and you are at increased risk for chlamydia or gonorrhea. Ask your health care provider if   you are at risk.  Ask your health care provider about whether you are at high risk for HIV. Your health care provider may recommend a prescription medicine to help prevent HIV infection. If you choose to take medicine to prevent HIV, you should first get tested for HIV. You should then be tested every 3 months for as long as you are taking the medicine. Pregnancy  If you are about to stop having your period (premenopausal) and  you may become pregnant, seek counseling before you get pregnant.  Take 400 to 800 micrograms (mcg) of folic acid every day if you become pregnant.  Ask for birth control (contraception) if you want to prevent pregnancy. Osteoporosis and menopause Osteoporosis is a disease in which the bones lose minerals and strength with aging. This can result in bone fractures. If you are 65 years old or older, or if you are at risk for osteoporosis and fractures, ask your health care provider if you should:  Be screened for bone loss.  Take a calcium or vitamin D supplement to lower your risk of fractures.  Be given hormone replacement therapy (HRT) to treat symptoms of menopause. Follow these instructions at home: Lifestyle  Do not use any products that contain nicotine or tobacco, such as cigarettes, e-cigarettes, and chewing tobacco. If you need help quitting, ask your health care provider.  Do not use street drugs.  Do not share needles.  Ask your health care provider for help if you need support or information about quitting drugs. Alcohol use  Do not drink alcohol if: ? Your health care provider tells you not to drink. ? You are pregnant, may be pregnant, or are planning to become pregnant.  If you drink alcohol: ? Limit how much you use to 0-1 drink a day. ? Limit intake if you are breastfeeding.  Be aware of how much alcohol is in your drink. In the [place] one drink equals one 12 oz bottle of beer (355 mL), one 5 oz glass of wine (148 mL), or one 1 oz glass of hard liquor (44 mL). General instructions  Schedule regular health, dental, and eye exams.  Stay current with your vaccines.  Tell your health care provider if: ? You often feel depressed. ? You have ever been abused or do not feel safe at home. Summary  Adopting a healthy lifestyle and getting preventive care are important in promoting health and wellness.  Follow your health care provider's instructions about healthy  diet, exercising, and getting tested or screened for diseases.  Follow your health care provider's instructions on monitoring your cholesterol and blood pressure. This information is not intended to replace advice given to you by your health care provider. Make sure you discuss any questions you have with your health care provider. Document Released: 11/06/2010 Document Revised: 04/16/2018 Document Reviewed: 04/16/2018 Elsevier Patient Education  2020 Elsevier Inc.  

## 2019-04-24 NOTE — Assessment & Plan Note (Signed)
Taking benazepril and bisoprolol/hctz. Checking CMP and adjust as needed. BP at goal.

## 2019-05-19 ENCOUNTER — Other Ambulatory Visit: Payer: Self-pay | Admitting: Obstetrics

## 2019-05-19 DIAGNOSIS — Z1231 Encounter for screening mammogram for malignant neoplasm of breast: Secondary | ICD-10-CM

## 2019-06-26 ENCOUNTER — Ambulatory Visit: Payer: Medicare Other

## 2019-07-06 ENCOUNTER — Other Ambulatory Visit: Payer: Self-pay

## 2019-07-06 ENCOUNTER — Ambulatory Visit (INDEPENDENT_AMBULATORY_CARE_PROVIDER_SITE_OTHER): Payer: Medicare Other | Admitting: Internal Medicine

## 2019-07-06 ENCOUNTER — Encounter: Payer: Self-pay | Admitting: Internal Medicine

## 2019-07-06 VITALS — BP 156/94 | HR 64 | Temp 98.9°F | Ht 62.0 in | Wt 218.6 lb

## 2019-07-06 DIAGNOSIS — M545 Low back pain, unspecified: Secondary | ICD-10-CM | POA: Insufficient documentation

## 2019-07-06 DIAGNOSIS — R399 Unspecified symptoms and signs involving the genitourinary system: Secondary | ICD-10-CM

## 2019-07-06 LAB — POC URINALSYSI DIPSTICK (AUTOMATED)
Bilirubin, UA: NEGATIVE
Blood, UA: NEGATIVE
Glucose, UA: NEGATIVE
Ketones, UA: NEGATIVE
Leukocytes, UA: NEGATIVE
Nitrite, UA: NEGATIVE
Protein, UA: NEGATIVE
Spec Grav, UA: 1.01 (ref 1.010–1.025)
Urobilinogen, UA: NEGATIVE E.U./dL — AB
pH, UA: 8 (ref 5.0–8.0)

## 2019-07-06 MED ORDER — KETOROLAC TROMETHAMINE 30 MG/ML IJ SOLN: 30.0000 mg | Freq: Once | INTRAMUSCULAR | Status: DC

## 2019-07-06 MED ORDER — KETOROLAC TROMETHAMINE 30 MG/ML IJ SOLN
30.0000 mg | Freq: Once | INTRAMUSCULAR | Status: AC
  Administered 2019-07-06: 30 mg via INTRAMUSCULAR

## 2019-07-06 MED ORDER — TIZANIDINE HCL 2 MG PO CAPS: 2.0000 mg | ORAL_CAPSULE | Freq: Two times a day (BID) | ORAL | 0 refills | Status: DC | PRN

## 2019-07-06 MED ORDER — METHYLPREDNISOLONE ACETATE 40 MG/ML IJ SUSP
40.0000 mg | Freq: Once | INTRAMUSCULAR | Status: AC
  Administered 2019-07-06: 40 mg via INTRAMUSCULAR

## 2019-07-06 NOTE — Progress Notes (Signed)
   Subjective:   Patient ID: Kim Fuller, female    DOB: 02/24/1946, 74 y.o.   MRN: [id number]  HPI The patient is a 74 YO female coming in for lower back pain. Started a long time ago but worse in the last week, overall it is worsening. Pain 10/10 sharp which did not last more than 30 minutes, denies blood in urine, burning with urination. Denies prior kidney stones. Has had UTI without burning before so is worried about that. Has tried ibuprofen but not in last 12 hours. Denies fevers or chills. Denies nausea or vomiting. Pain is okay control with ibuprofen. Tylenol does not hep the pain at all. Denies injury or lifting recently. No radiation of pain.   Review of Systems  Constitutional: Negative.   HENT: Negative.   Eyes: Negative.   Respiratory: Negative for cough, chest tightness and shortness of breath.   Cardiovascular: Negative for chest pain, palpitations and leg swelling.  Gastrointestinal: Negative for abdominal distention, abdominal pain, constipation, diarrhea, nausea and vomiting.  Musculoskeletal: Positive for back pain and myalgias.  Skin: Negative.   Neurological: Negative.   Psychiatric/Behavioral: Negative.     Objective:  Physical Exam Constitutional:      Appearance: She is well-developed.  HENT:     Head: Normocephalic and atraumatic.  Cardiovascular:     Rate and Rhythm: Normal rate and regular rhythm.  Pulmonary:     Effort: Pulmonary effort is normal. No respiratory distress.     Breath sounds: Normal breath sounds. No wheezing or rales.  Abdominal:     General: Bowel sounds are normal. There is no distension.     Palpations: Abdomen is soft.     Tenderness: There is no abdominal tenderness. There is no rebound.     Comments: Minimal tenderness llq  Musculoskeletal:        General: Tenderness present.     Cervical back: Normal range of motion.     Comments: Left flank/thoracic region pain, not worse with palpation and no radiation  Skin:    General:  Skin is warm and dry.  Neurological:     Mental Status: She is alert and oriented to person, place, and time.     Coordination: Coordination normal.     Vitals:   07/06/19 1033  BP: (!) 156/94  Pulse: 64  Temp: 98.9 F (37.2 C)  TempSrc: Oral  SpO2: 97%  Weight: 218 lb 9.6 oz (99.2 kg)  Height: 5\' 2"  (1.575 m)    This visit occurred during the SARS-CoV-2 public health emergency.  Safety protocols were in place, including screening questions prior to the visit, additional usage of staff PPE, and extensive cleaning of exam room while observing appropriate contact time as indicated for disinfecting solutions.   Assessment & Plan:  Depo-medrol 40 mg IM and toradol 30 mg IM given at visit  Visit time 20 minutes in face to face communication with patient and coordination of care, additional 10 minutes spent in record review, coordination or care, ordering tests, communicating/referring to other healthcare professionals, documenting in medical records all on the same day of the visit for total time 30 minutes spent on the visit.

## 2019-07-06 NOTE — Assessment & Plan Note (Signed)
Given toradol 30 mg IM and depo-medrol 40 mg IM at visit. U/A done in office not consistent with infection and no Hg to suggest renal stone. Prior imaging reviewed and 2013 CT abdomen/pelvis did show left renal stone non-obstructive but she has never had clinical kidney stones. If no improvement in 2-3 days needs CT abdomen/pelvis without to rule out renal stone causing pain.

## 2019-07-06 NOTE — Patient Instructions (Signed)
We have sent in a muscle relaxer called tizandine to use if needed up to twice a day.   The shots we have given you today should help. Let us know if 2-3 days if not feeling better or if getting worse.

## 2019-07-20 DIAGNOSIS — Z961 Presence of intraocular lens: Secondary | ICD-10-CM | POA: Diagnosis not present

## 2019-07-31 ENCOUNTER — Ambulatory Visit: Payer: Medicare Other

## 2019-08-19 ENCOUNTER — Other Ambulatory Visit: Payer: Self-pay

## 2019-08-19 ENCOUNTER — Ambulatory Visit
Admission: RE | Admit: 2019-08-19 | Discharge: 2019-08-19 | Disposition: A | Discharge status: Home / Self Care | Payer: Medicare Other | Source: Ambulatory Visit | Attending: Obstetrics | Admitting: Obstetrics

## 2019-08-19 DIAGNOSIS — Z1231 Encounter for screening mammogram for malignant neoplasm of breast: Secondary | ICD-10-CM

## 2019-08-20 ENCOUNTER — Other Ambulatory Visit: Payer: Self-pay | Admitting: Obstetrics

## 2019-08-20 DIAGNOSIS — R928 Other abnormal and inconclusive findings on diagnostic imaging of breast: Secondary | ICD-10-CM

## 2019-08-24 ENCOUNTER — Other Ambulatory Visit: Payer: Self-pay

## 2019-08-24 ENCOUNTER — Ambulatory Visit
Admission: RE | Admit: 2019-08-24 | Discharge: 2019-08-24 | Disposition: A | Discharge status: Home / Self Care | Payer: Medicare Other | Source: Ambulatory Visit | Attending: Obstetrics | Admitting: Obstetrics

## 2019-08-24 ENCOUNTER — Other Ambulatory Visit: Payer: Self-pay | Admitting: Obstetrics

## 2019-08-24 DIAGNOSIS — R928 Other abnormal and inconclusive findings on diagnostic imaging of breast: Secondary | ICD-10-CM

## 2019-08-24 DIAGNOSIS — R922 Inconclusive mammogram: Secondary | ICD-10-CM | POA: Diagnosis not present

## 2019-08-24 DIAGNOSIS — N6321 Unspecified lump in the left breast, upper outer quadrant: Secondary | ICD-10-CM | POA: Diagnosis not present

## 2019-09-01 ENCOUNTER — Other Ambulatory Visit: Payer: Self-pay

## 2019-09-01 ENCOUNTER — Ambulatory Visit
Admission: RE | Admit: 2019-09-01 | Discharge: 2019-09-01 | Disposition: A | Discharge status: Home / Self Care | Payer: Medicare Other | Source: Ambulatory Visit | Attending: Obstetrics | Admitting: Obstetrics

## 2019-09-01 DIAGNOSIS — N6321 Unspecified lump in the left breast, upper outer quadrant: Secondary | ICD-10-CM | POA: Diagnosis not present

## 2019-09-01 DIAGNOSIS — R928 Other abnormal and inconclusive findings on diagnostic imaging of breast: Secondary | ICD-10-CM

## 2019-09-01 DIAGNOSIS — C50412 Malignant neoplasm of upper-outer quadrant of left female breast: Secondary | ICD-10-CM | POA: Diagnosis not present

## 2019-09-02 ENCOUNTER — Encounter: Payer: Self-pay | Admitting: *Deleted

## 2019-09-03 ENCOUNTER — Other Ambulatory Visit: Payer: Medicare Other

## 2019-09-03 ENCOUNTER — Telehealth: Payer: Self-pay | Admitting: Hematology

## 2019-09-03 NOTE — Telephone Encounter (Signed)
Spoke to patient to confirm afternoon [facility] appointment for 5/5, packet will be mailed to patient

## 2019-09-08 ENCOUNTER — Other Ambulatory Visit: Payer: Self-pay | Admitting: *Deleted

## 2019-09-08 DIAGNOSIS — C50412 Malignant neoplasm of upper-outer quadrant of left female breast: Secondary | ICD-10-CM

## 2019-09-08 DIAGNOSIS — Z171 Estrogen receptor negative status [ER-]: Secondary | ICD-10-CM

## 2019-09-08 DIAGNOSIS — Z853 Personal history of malignant neoplasm of breast: Secondary | ICD-10-CM | POA: Insufficient documentation

## 2019-09-08 NOTE — Progress Notes (Signed)
[facility] NOTE  Patient Care Team: Hoyt Koch, MD as PCP - General (Internal Medicine) Mauro Kaufmann, RN as Oncology Nurse Navigator Rockwell Germany, RN as Oncology Nurse Navigator Donnie Mesa, MD as Consulting Physician (General Surgery) Nicholas Lose, MD as Consulting Physician (Hematology and Oncology) Kyung Rudd, MD as Consulting Physician (Radiation Oncology)  CHIEF COMPLAINTS/PURPOSE OF CONSULTATION:  Newly diagnosed breast cancer  HISTORY OF PRESENTING ILLNESS:  Kim Fuller 74 y.o. female is here because of recent diagnosis of triple negative invasive ductal carcinoma of the left breast. Screening mammogram on 08/19/19 detected a left breast asymmetry. Diagnostic mammogram and [place] on 08/24/19 showed a 0.5cm mass at the 2 o'clock position, with no axillary adenopathy. Biopsy on 09/01/19 showed invasive ductal carcinoma, grade 3, HER-2 negative (0), ER/PR negative, Ki67 50%. She presents to the clinic today for initial evaluation and discussion of treatment options.   I reviewed her records extensively and collaborated the history with the patient.  SUMMARY OF ONCOLOGIC HISTORY: Oncology History  Malignant neoplasm of upper-outer quadrant of left breast in female, estrogen receptor negative ([place])  09/08/2019 Initial Diagnosis   Screening mammogram detected a left breast asymmetry. Diagnostic mammogram and US showed a 0.5cm mass, 2 o'clock position, with no axillary adenopathy. Biopsy showed IDC, grade 3, HER-2 - (0), ER/PR -, Ki67 50%.   09/09/2019 Cancer Staging   Staging form: Breast, AJCC 8th Edition - Clinical stage from 09/09/2019: Stage IB (cT1a, cN0, cM0, G3, ER-, PR-, HER2-) - Signed by Nicholas Lose, MD on 09/09/2019     MEDICAL HISTORY:  Past Medical History:  Diagnosis Date  . Abnormal finding on Pap smear    x1  . Diverticulosis   . GERD (gastroesophageal reflux disease)   . Hyperlipidemia   . Hypertension   . Osteopenia     -1.5 @ femoral neck 11/2007  . Reactive airway disease    years ago ; severe asthma attack "bornchitis asthma " per patient, no issues since     SURGICAL HISTORY: Past Surgical History:  Procedure Laterality Date  . CARDIOVASCULAR STRESS TEST  03/05/06  . CARPAL TUNNEL RELEASE  2009    bilaterally  . CATARACT EXTRACTION, BILATERAL     Dr Gershon Crane  . COLONOSCOPY W/ POLYPECTOMY  1998   Tics @ 2004 & 2012; Dr Carlean Purl  . HAND SURGERY     Trigger thumb  . PARTIAL KNEE ARTHROPLASTY Left 04/03/2017   Procedure: Left knee medial unicompartmental arthroplasty;  Surgeon: Gaynelle Arabian, MD;  Location: [facility];  Service: Orthopedics;  Laterality: Left;  Adductor canal block  . TRIGGER FINGER RELEASE Right   . TUBAL LIGATION      SOCIAL HISTORY: Social History   Socioeconomic History  . Marital status: Married    Spouse name: Not on file  . Number of children: 2  . Years of education: Not on file  . Highest education level: Not on file  Occupational History  . Occupation: retired  Tobacco Use  . Smoking status: Former Smoker    Quit date: 05/07/1970    Years since quitting: 49.3  . Smokeless tobacco: Never Used  . Tobacco comment: Smoked as a teen  1962-1972,only up to 3 cigarettes/ day  Substance and Sexual Activity  . Alcohol use: Yes    Comment: occas  . Drug use: No  . Sexual activity: Not Currently  Other Topics Concern  . Not on file  Social History Narrative  . Not on file  Social Determinants of Health   Financial Resource Strain:   . Difficulty of Paying Living Expenses:   Food Insecurity:   . Worried About Charity fundraiser in the Last Year:   . Arboriculturist in the Last Year:   Transportation Needs:   . Film/video editor (Medical):   Marland Kitchen Lack of Transportation (Non-Medical):   Physical Activity: Inactive  . Days of Exercise per Week: 0 days  . Minutes of Exercise per Session: 0 min  Stress:   . Feeling of Stress :   Social Connections:   .  Frequency of Communication with Friends and Family:   . Frequency of Social Gatherings with Friends and Family:   . Attends Religious Services:   . Active Member of Clubs or Organizations:   . Attends Archivist Meetings:   Marland Kitchen Marital Status:   Intimate Partner Violence:   . Fear of Current or Ex-Partner:   . Emotionally Abused:   Marland Kitchen Physically Abused:   . Sexually Abused:     FAMILY HISTORY: Family History  Problem Relation Age of Onset  . Asthma Sister   . Hypertension Sister   . Hypertension Father   . Heart attack Father        MI > 68  . Prostate cancer Father   . Lung cancer Father   . Lung cancer Sister        smoker  . Melanoma Sister   . Osteoporosis Sister   . Glaucoma Sister        X 2  . Diverticulosis Sister        2 sisters  . Lung cancer Sister   . Colon polyps Sister        2 sisters with polyps  . Diverticulitis Sister        1 sister  . Hypertension Mother   . Hypertension Brother   . Skin cancer Sister   . Diabetes Neg Hx   . Stroke Neg Hx   . Breast cancer Neg Hx     ALLERGIES:  is allergic to sulfonamide derivatives; tramadol hcl; and tramadol hcl.  MEDICATIONS:  Current Outpatient Medications  Medication Sig Dispense Refill  . aspirin 81 MG chewable tablet Chew 81 mg by mouth daily.    . benazepril (LOTENSIN) 40 MG tablet Take 0.5 tablets (20 mg total) by mouth daily. 45 tablet 3  . bisoprolol-hydrochlorothiazide (ZIAC) 5-6.25 MG tablet Take 1 tablet by mouth daily. 90 tablet 3  . pravastatin (PRAVACHOL) 20 MG tablet Take 1 tablet (20 mg total) by mouth daily. 90 tablet 3  . calcium carbonate (OSCAL) 1500 (600 Ca) MG TABS tablet Take 600 mg of elemental calcium by mouth 2 (two) times daily with a meal.    . cholecalciferol (VITAMIN D3) 10 MCG (400 UNIT) TABS tablet Take 400 Units by mouth.    . esomeprazole (NEXIUM) 10 MG packet Take 10 mg by mouth daily before breakfast.    . fluticasone (FLONASE) 50 MCG/ACT nasal spray USE TWO  SPRAY(S) IN EACH NOSTRIL DAILY 48 g 6  . ibuprofen (ADVIL) 200 MG tablet Take 200 mg by mouth every 6 (six) hours as needed.    . Multiple Vitamins-Minerals (CENTRUM SILVER ULTRA WOMENS PO) Take 1 tablet by mouth daily.    . Omega-3 Fatty Acids (FISH OIL) 1000 MG CAPS Take 1,000 mg by mouth 2 (two) times daily.    Marland Kitchen Propylene Glycol (SYSTANE BALANCE) 0.6 % SOLN Place 1 drop 3 (  three) times daily as needed into both eyes (dry eyes).    . tizanidine (ZANAFLEX) 2 MG capsule Take 1 capsule (2 mg total) by mouth 2 (two) times daily as needed for muscle spasms. 30 capsule 0  . vitamin E 400 UNIT capsule Take 400 Units by mouth daily.     No current facility-administered medications for this visit.    REVIEW OF SYSTEMS:   Constitutional: Denies fevers, chills or abnormal night sweats Eyes: Denies blurriness of vision, double vision or watery eyes Ears, nose, mouth, throat, and face: Denies mucositis or sore throat Respiratory: Denies cough, dyspnea or wheezes Cardiovascular: Denies palpitation, chest discomfort or lower extremity swelling Gastrointestinal:  Denies nausea, heartburn or change in bowel habits Skin: Denies abnormal skin rashes Lymphatics: Denies new lymphadenopathy or easy bruising Neurological:Denies numbness, tingling or new weaknesses Behavioral/Psych: Mood is stable, no new changes  Breast: Denies any palpable lumps or discharge All other systems were reviewed with the patient and are negative.  PHYSICAL EXAMINATION: ECOG PERFORMANCE STATUS: 1 - Symptomatic but completely ambulatory  Vitals:   09/09/19 0901  BP: 139/71  Pulse: (!) 56  Resp: 18  Temp: 98.5 F (36.9 C)  SpO2: 96%   Filed Weights   09/09/19 0901  Weight: 213 lb 9.6 oz (96.9 kg)    GENERAL:alert, no distress and comfortable SKIN: skin color, texture, turgor are normal, no rashes or significant lesions EYES: normal, conjunctiva are pink and non-injected, sclera clear OROPHARYNX:no exudate, no  erythema and lips, buccal mucosa, and tongue normal  NECK: supple, thyroid normal size, non-tender, without nodularity LYMPH:  no palpable lymphadenopathy in the cervical, axillary or inguinal LUNGS: clear to auscultation and percussion with normal breathing effort HEART: regular rate & rhythm and no murmurs and no lower extremity edema ABDOMEN:abdomen soft, non-tender and normal bowel sounds Musculoskeletal:no cyanosis of digits and no clubbing  PSYCH: alert & oriented x 3 with fluent speech NEURO: no focal motor/sensory deficits BREAST: No palpable nodules in breast. No palpable axillary or supraclavicular lymphadenopathy (exam performed in the presence of a chaperone)   LABORATORY DATA:  I have reviewed the data as listed Lab Results  Component Value Date   WBC 7.1 09/09/2019   HGB 13.9 09/09/2019   HCT 42.8 09/09/2019   MCV 100.7 (H) 09/09/2019   PLT 169 09/09/2019   Lab Results  Component Value Date   NA 144 09/09/2019   K 3.8 09/09/2019   CL 102 09/09/2019   CO2 33 (H) 09/09/2019    RADIOGRAPHIC STUDIES: I have personally reviewed the radiological reports and agreed with the findings in the report.  ASSESSMENT AND PLAN:  Malignant neoplasm of upper-outer quadrant of left breast in female, estrogen receptor negative ([place]) 09/01/2019: Screening mammogram detected a left breast asymmetry. Diagnostic mammogram and US showed a 0.5cm mass, 2 o'clock position, with no axillary adenopathy. Biopsy showed IDC, grade 3, HER-2 - (0), ER/PR -, Ki67 50%. T1 a N0 stage Ib clinical stage  Pathology and radiology counseling: Discussed with the patient, the details of pathology including the type of breast cancer,the clinical staging, the significance of ER, PR and HER-2/neu receptors and the implications for treatment. After reviewing the pathology in detail, we proceeded to discuss the different treatment options between surgery, radiation, chemotherapy, antiestrogen  therapies.  Recommendation: 1.  Breast conserving surgery with sentinel lymph node biopsy 2.  Adjuvant chemotherapy based upon final size and patient's performance status 3.  Adjuvant radiation therapy  Return to clinic after surgery to  discuss pathology report and to finalize the adjuvant treatment plan.   All questions were answered. The patient knows to call the clinic with any problems, questions or concerns.   Rulon Eisenmenger, MD 09/09/2019    I, Molly Dorshimer, am acting as scribe for Nicholas Lose, MD.  I have reviewed the above documentation for accuracy and completeness, and I agree with the above.

## 2019-09-09 ENCOUNTER — Other Ambulatory Visit: Payer: Self-pay

## 2019-09-09 ENCOUNTER — Encounter: Payer: Self-pay | Admitting: Licensed Clinical Social Worker

## 2019-09-09 ENCOUNTER — Inpatient Hospital Stay: Payer: Medicare Other | Attending: Hematology and Oncology | Admitting: Hematology and Oncology

## 2019-09-09 ENCOUNTER — Encounter: Payer: Self-pay | Admitting: Physical Therapy

## 2019-09-09 ENCOUNTER — Ambulatory Visit: Payer: Self-pay | Admitting: Surgery

## 2019-09-09 ENCOUNTER — Ambulatory Visit: Payer: Medicare Other | Attending: Surgery | Admitting: Physical Therapy

## 2019-09-09 ENCOUNTER — Ambulatory Visit
Admission: RE | Admit: 2019-09-09 | Discharge: 2019-09-09 | Disposition: A | Discharge status: Home / Self Care | Payer: Medicare Other | Source: Ambulatory Visit | Attending: Radiation Oncology | Admitting: Radiation Oncology

## 2019-09-09 ENCOUNTER — Inpatient Hospital Stay: Payer: Medicare Other

## 2019-09-09 ENCOUNTER — Encounter: Payer: Self-pay | Admitting: *Deleted

## 2019-09-09 ENCOUNTER — Encounter: Payer: Self-pay | Admitting: Hematology and Oncology

## 2019-09-09 DIAGNOSIS — Z79899 Other long term (current) drug therapy: Secondary | ICD-10-CM | POA: Diagnosis not present

## 2019-09-09 DIAGNOSIS — M8580 Other specified disorders of bone density and structure, unspecified site: Secondary | ICD-10-CM | POA: Diagnosis not present

## 2019-09-09 DIAGNOSIS — C50412 Malignant neoplasm of upper-outer quadrant of left female breast: Secondary | ICD-10-CM

## 2019-09-09 DIAGNOSIS — Z87891 Personal history of nicotine dependence: Secondary | ICD-10-CM | POA: Insufficient documentation

## 2019-09-09 DIAGNOSIS — E785 Hyperlipidemia, unspecified: Secondary | ICD-10-CM | POA: Diagnosis not present

## 2019-09-09 DIAGNOSIS — Z171 Estrogen receptor negative status [ER-]: Secondary | ICD-10-CM

## 2019-09-09 DIAGNOSIS — J45909 Unspecified asthma, uncomplicated: Secondary | ICD-10-CM | POA: Insufficient documentation

## 2019-09-09 DIAGNOSIS — M549 Dorsalgia, unspecified: Secondary | ICD-10-CM

## 2019-09-09 DIAGNOSIS — R293 Abnormal posture: Secondary | ICD-10-CM | POA: Insufficient documentation

## 2019-09-09 DIAGNOSIS — K219 Gastro-esophageal reflux disease without esophagitis: Secondary | ICD-10-CM | POA: Diagnosis not present

## 2019-09-09 DIAGNOSIS — C50912 Malignant neoplasm of unspecified site of left female breast: Secondary | ICD-10-CM | POA: Diagnosis not present

## 2019-09-09 DIAGNOSIS — I10 Essential (primary) hypertension: Secondary | ICD-10-CM | POA: Insufficient documentation

## 2019-09-09 DIAGNOSIS — Z7982 Long term (current) use of aspirin: Secondary | ICD-10-CM | POA: Diagnosis not present

## 2019-09-09 LAB — CMP (CANCER CENTER ONLY)
ALT: 17 U/L (ref 0–44)
AST: 18 U/L (ref 15–41)
Albumin: 3.7 g/dL (ref 3.5–5.0)
Alkaline Phosphatase: 53 U/L (ref 38–126)
Anion gap: 9 (ref 5–15)
BUN: 15 mg/dL (ref 8–23)
CO2: 33 mmol/L — ABNORMAL HIGH (ref 22–32)
Calcium: 10.4 mg/dL — ABNORMAL HIGH (ref 8.9–10.3)
Chloride: 102 mmol/L (ref 98–111)
Creatinine: 0.79 mg/dL (ref 0.44–1.00)
GFR, Est AFR Am: >60 mL/min (ref >60)
GFR, Estimated: >60 mL/min (ref >60)
Glucose, Bld: 89 mg/dL (ref 70–99)
Potassium: 3.8 mmol/L (ref 3.5–5.1)
Sodium: 144 mmol/L (ref 135–145)
Total Bilirubin: 0.6 mg/dL (ref 0.3–1.2)
Total Protein: 7.5 g/dL (ref 6.5–8.1)

## 2019-09-09 LAB — CBC WITH DIFFERENTIAL (CANCER CENTER ONLY)
Abs Immature Granulocytes: 0.01 10*3/uL (ref 0.00–0.07)
Basophils Absolute: 0.1 10*3/uL (ref 0.0–0.1)
Basophils Relative: 1 %
Eosinophils Absolute: 0.1 10*3/uL (ref 0.0–0.5)
Eosinophils Relative: 1 %
HCT: 42.8 % (ref 36.0–46.0)
Hemoglobin: 13.9 g/dL (ref 12.0–15.0)
Immature Granulocytes: 0 %
Lymphocytes Relative: 29 %
Lymphs Abs: 2.1 10*3/uL (ref 0.7–4.0)
MCH: 32.7 pg (ref 26.0–34.0)
MCHC: 32.5 g/dL (ref 30.0–36.0)
MCV: 100.7 fL — ABNORMAL HIGH (ref 80.0–100.0)
Monocytes Absolute: 1 10*3/uL (ref 0.1–1.0)
Monocytes Relative: 14 %
Neutro Abs: 3.9 10*3/uL (ref 1.7–7.7)
Neutrophils Relative %: 55 %
Platelet Count: 169 10*3/uL (ref 150–400)
RBC: 4.25 MIL/uL (ref 3.87–5.11)
RDW: 12.5 % (ref 11.5–15.5)
WBC Count: 7.1 10*3/uL (ref 4.0–10.5)
nRBC: 0 % (ref 0.0–0.2)

## 2019-09-09 LAB — GENETIC SCREENING ORDER

## 2019-09-09 NOTE — Patient Instructions (Signed)

## 2019-09-09 NOTE — Progress Notes (Signed)
Radiation Oncology         [phone] ________________________________  Name: Kim Fuller        MRN: [id number]  Date of Service: 09/09/2019 DOB: [birth year]  YO:[id number], Real Cons, MD  Hoyt Koch, *     REFERRING PHYSICIAN: Pricilla Holm A, *   DIAGNOSIS: The encounter diagnosis was Malignant neoplasm of upper-outer quadrant of left breast in female, estrogen receptor negative ([place]).   HISTORY OF PRESENT ILLNESS: Kim Fuller is a 74 y.o. female seen in the multidisciplinary breast clinic for a new diagnosis of left breast cancer. The patient was noted to have a screening detected mass in the left breast. Diagnostic imaging revealed a mass in the upper outer quadrant at 2:00 measuring 5 x 4 x 4 mm, and her left axilla was negative for adenopathy. A biopsy of the breast on 09/01/19 revealed a grade 3 invasive ductal carcinoma that was triple negative with a Ki 67 of 50%. She's seen today to discuss treatment recommendations for her cancer.   PREVIOUS RADIATION THERAPY: No   PAST MEDICAL HISTORY:  Past Medical History:  Diagnosis Date  . Abnormal finding on Pap smear    x1  . Diverticulosis   . GERD (gastroesophageal reflux disease)   . Hyperlipidemia   . Hypertension   . Osteopenia    -1.5 @ femoral neck 11/2007  . Reactive airway disease    years ago ; severe asthma attack "bornchitis asthma " per patient, no issues since        PAST SURGICAL HISTORY: Past Surgical History:  Procedure Laterality Date  . CARDIOVASCULAR STRESS TEST  03/05/06  . CARPAL TUNNEL RELEASE  2009    bilaterally  . CATARACT EXTRACTION, BILATERAL     Dr Gershon Crane  . COLONOSCOPY W/ POLYPECTOMY  1998   Tics @ 2004 & 2012; Dr Carlean Purl  . HAND SURGERY     Trigger thumb  . PARTIAL KNEE ARTHROPLASTY Left 04/03/2017   Procedure: Left knee medial unicompartmental arthroplasty;  Surgeon: Gaynelle Arabian, MD;  Location: [facility];  Service: Orthopedics;  Laterality: Left;   Adductor canal block  . TRIGGER FINGER RELEASE Right   . TUBAL LIGATION       FAMILY HISTORY:  Family History  Problem Relation Age of Onset  . Asthma Sister   . Hypertension Sister   . Hypertension Father   . Heart attack Father        MI > 44  . Prostate cancer Father   . Lung cancer Sister        smoker  . Melanoma Sister   . Osteoporosis Sister   . Glaucoma Sister        X 2  . Diverticulosis Sister        2 sisters  . Lung cancer Sister   . Colon polyps Sister        2 sisters with polyps  . Diverticulitis Sister        1 sister  . Hypertension Mother   . Hypertension Brother   . Skin cancer Sister   . Diabetes Neg Hx   . Stroke Neg Hx   . Breast cancer Neg Hx      SOCIAL HISTORY:  reports that she quit smoking about 49 years ago. She has never used smokeless tobacco. She reports current alcohol use. She reports that she does not use drugs. The patient is married and lives in [place]. She is retired and accompanied by her husband.  She is retired from working in a [employer].    ALLERGIES: Sulfonamide derivatives, Tramadol hcl, and Tramadol hcl   MEDICATIONS:  Current Outpatient Medications  Medication Sig Dispense Refill  . acetaminophen (TYLENOL) 325 MG tablet Take 650 mg by mouth every 6 (six) hours as needed.    Marland Kitchen aspirin 81 MG chewable tablet Chew 81 mg by mouth daily.    . benazepril (LOTENSIN) 40 MG tablet Take 0.5 tablets (20 mg total) by mouth daily. 45 tablet 3  . bisoprolol-hydrochlorothiazide (ZIAC) 5-6.25 MG tablet Take 1 tablet by mouth daily. 90 tablet 3  . calcium carbonate (OSCAL) 1500 (600 Ca) MG TABS tablet Take 600 mg of elemental calcium by mouth 2 (two) times daily with a meal.    . cholecalciferol (VITAMIN D3) 10 MCG (400 UNIT) TABS tablet Take 400 Units by mouth.    . esomeprazole (NEXIUM) 10 MG packet Take 10 mg by mouth daily before breakfast.    . fluticasone (FLONASE) 50 MCG/ACT nasal spray USE TWO SPRAY(S) IN EACH  NOSTRIL DAILY 48 g 6  . ibuprofen (ADVIL) 200 MG tablet Take 200 mg by mouth every 6 (six) hours as needed.    . Influenza vac split quadrivalent PF (FLUZONE HIGH-DOSE) 0.5 ML injection Fluzone High-Dose 2018-2019 (PF) 180 mcg/0.5 mL intramuscular syringe  ADM 0.5ML IM UTD    . influenza vaccine adjuvanted (FLUAD QUADRIVALENT) 0.5 ML injection Fluad Quad 2020-2021(67yrup)(PF) 60 mcg (15 mcg x 4)/0.539mIM syringe  PHARMACY ADMINISTERED    . Influenza Virus Vaccine Split SUSP Fluvirin 2014-2015 45 mcg (15 mcg x 3)/0.5 mL intramuscular suspension    . Multiple Vitamins-Minerals (CENTRUM SILVER ULTRA WOMENS PO) Take 1 tablet by mouth daily.    . Omega-3 Fatty Acids (FISH OIL) 1000 MG CAPS Take 1,000 mg by mouth 2 (two) times daily.    . pravastatin (PRAVACHOL) 20 MG tablet Take 1 tablet (20 mg total) by mouth daily. 90 tablet 3  . Propylene Glycol (SYSTANE BALANCE) 0.6 % SOLN Place 1 drop 3 (three) times daily as needed into both eyes (dry eyes).    . tizanidine (ZANAFLEX) 2 MG capsule Take 1 capsule (2 mg total) by mouth 2 (two) times daily as needed for muscle spasms. 30 capsule 0  . vitamin E 400 UNIT capsule Take 400 Units by mouth daily.     No current facility-administered medications for this encounter.     REVIEW OF SYSTEMS: On review of systems, the patient reports that she is doing well overall. She is anxious about the recommendations for her treatment. She reports she's had both Moderna covid vaccines several weeks ago. She denies any chest pain, shortness of breath, cough, fevers, chills, night sweats, unintended weight changes. She denies any bowel or bladder disturbances, and denies abdominal pain, nausea or vomiting. She denies any new musculoskeletal or joint aches or pains. A complete review of systems is obtained and is otherwise negative.     PHYSICAL EXAM:  Wt Readings from Last 3 Encounters:  07/06/19 218 lb 9.6 oz (99.2 kg)  04/24/19 219 lb (99.3 kg)  04/14/19 217 lb  (98.4 kg)   Temp Readings from Last 3 Encounters:  07/06/19 98.9 F (37.2 C) (Oral)  04/24/19 98 F (36.7 C) (Oral)  04/21/18 97.7 F (36.5 C) (Oral)   BP Readings from Last 3 Encounters:  07/06/19 (!) 156/94  04/24/19 130/80  04/14/19 (!) 142/88   Pulse Readings from Last 3 Encounters:  07/06/19 64  04/24/19 (!) 54  04/14/19 (!) 54    In general this is a well appearing caucasian female in no acute distress. She's alert and oriented x4 and appropriate throughout the examination. Cardiopulmonary assessment is negative for acute distress and she exhibits normal effort. Bilateral breast exam is deferred.    ECOG = 0  0 - Asymptomatic (Fully active, able to carry on all predisease activities without restriction)  1 - Symptomatic but completely ambulatory (Restricted in physically strenuous activity but ambulatory and able to carry out work of a light or sedentary nature. For example, light housework, office work)  2 - Symptomatic, <50% in bed during the day (Ambulatory and capable of all self care but unable to carry out any work activities. Up and about more than 50% of waking hours)  3 - Symptomatic, >50% in bed, but not bedbound (Capable of only limited self-care, confined to bed or chair 50% or more of waking hours)  4 - Bedbound (Completely disabled. Cannot carry on any self-care. Totally confined to bed or chair)  5 - Death   Eustace Pen MM, Creech RH, Tormey DC, et al. [phone]). "Toxicity and response criteria of the [facility]". Rachel Oncol. 5 (6): 649-55    LABORATORY DATA:  Lab Results  Component Value Date   WBC 5.9 04/24/2019   HGB 14.1 04/24/2019   HCT 41.8 04/24/2019   MCV 98.7 04/24/2019   PLT 181.0 04/24/2019   Lab Results  Component Value Date   NA 139 04/24/2019   K 4.4 04/24/2019   CL 102 04/24/2019   CO2 32 04/24/2019   Lab Results  Component Value Date   ALT 23 04/24/2019   AST 22 04/24/2019   ALKPHOS 56 04/24/2019    BILITOT 0.6 04/24/2019      RADIOGRAPHY: US BREAST LTD UNI LEFT INC AXILLA  Result Date: 08/24/2019 CLINICAL DATA:  74 year old female recalled from screening mammogram dated 08/19/2019 for a possible left breast asymmetry. EXAM: DIGITAL DIAGNOSTIC LEFT MAMMOGRAM WITH CAD AND TOMO ULTRASOUND LEFT BREAST COMPARISON:  Previous exam(s). ACR Breast Density Category c: The breast tissue is heterogeneously dense, which may obscure small masses. FINDINGS: A focal asymmetry persists in the upper outer left breast at posterior depth. There is suggestion of mild associated distortion. Further evaluation with ultrasound was performed. Mammographic images were processed with CAD. Targeted ultrasound is performed, showing an irregular, hypoechoic mass at the 2 o'clock position 11 cm from the nipple. It measures 5 x 4 x 4 mm. There is no internal vascularity. This correlates well with the size and location of the mammographic abnormality. Evaluation of the left axilla demonstrates no suspicious lymphadenopathy. IMPRESSION: 1. Indeterminate left breast mass. Recommendation is for ultrasound-guided biopsy with careful attention to post clip films to ensure mammographic correlation. 2. No suspicious left axillary lymphadenopathy. RECOMMENDATION: Ultrasound-guided biopsy of the left breast with careful attention on post clip films to ensure correlation with the mammographically identified focal asymmetry. I have discussed the findings and recommendations with the patient. If applicable, a reminder letter will be sent to the patient regarding the next appointment. BI-RADS CATEGORY  4: Suspicious. Electronically Signed   By: Kristopher Oppenheim M.D.   On: 08/24/2019 11:28   MM DIAG BREAST TOMO UNI LEFT  Result Date: 08/24/2019 CLINICAL DATA:  74 year old female recalled from screening mammogram dated 08/19/2019 for a possible left breast asymmetry. EXAM: DIGITAL DIAGNOSTIC LEFT MAMMOGRAM WITH CAD AND TOMO ULTRASOUND LEFT BREAST  COMPARISON:  Previous exam(s). ACR Breast Density Category c:  The breast tissue is heterogeneously dense, which may obscure small masses. FINDINGS: A focal asymmetry persists in the upper outer left breast at posterior depth. There is suggestion of mild associated distortion. Further evaluation with ultrasound was performed. Mammographic images were processed with CAD. Targeted ultrasound is performed, showing an irregular, hypoechoic mass at the 2 o'clock position 11 cm from the nipple. It measures 5 x 4 x 4 mm. There is no internal vascularity. This correlates well with the size and location of the mammographic abnormality. Evaluation of the left axilla demonstrates no suspicious lymphadenopathy. IMPRESSION: 1. Indeterminate left breast mass. Recommendation is for ultrasound-guided biopsy with careful attention to post clip films to ensure mammographic correlation. 2. No suspicious left axillary lymphadenopathy. RECOMMENDATION: Ultrasound-guided biopsy of the left breast with careful attention on post clip films to ensure correlation with the mammographically identified focal asymmetry. I have discussed the findings and recommendations with the patient. If applicable, a reminder letter will be sent to the patient regarding the next appointment. BI-RADS CATEGORY  4: Suspicious. Electronically Signed   By: Kristopher Oppenheim M.D.   On: 08/24/2019 11:28   MM 3D SCREEN BREAST BILATERAL  Result Date: 08/19/2019 CLINICAL DATA:  Screening. EXAM: DIGITAL SCREENING BILATERAL MAMMOGRAM WITH TOMO AND CAD COMPARISON:  Previous exam(s). ACR Breast Density Category c: The breast tissue is heterogeneously dense, which may obscure small masses. FINDINGS: In the left breast, a possible asymmetry warrants further evaluation. In the right breast, no findings suspicious for malignancy. Images were processed with CAD. IMPRESSION: Further evaluation is suggested for possible asymmetry in the left breast. RECOMMENDATION: Diagnostic  mammogram and possibly ultrasound of the left breast. (Code:[id number]) The patient will be contacted regarding the findings, and additional imaging will be scheduled. BI-RADS CATEGORY  0: Incomplete. Need additional imaging evaluation and/or prior mammograms for comparison. Electronically Signed   By: Lovey Newcomer M.D.   On: 08/19/2019 13:39   MM CLIP PLACEMENT LEFT  Result Date: 09/01/2019 CLINICAL DATA:  Evaluate RIBBON clip placement following ultrasound-guided LEFT breast biopsy. EXAM: DIAGNOSTIC LEFT MAMMOGRAM POST ULTRASOUND BIOPSY COMPARISON:  Previous exam(s). FINDINGS: Mammographic images were obtained following ultrasound guided biopsy of the 0.5 cm mass at the 2 o'clock position of the LEFT breast. The RIBBON biopsy marking clip is in expected position at the site of biopsy and corresponds to the mammographic asymmetry. IMPRESSION: Appropriate positioning of the RIBBON shaped biopsy marking clip at the site of biopsy in the UPPER OUTER LEFT breast. This corresponds to the asymmetry identified mammographically. Final Assessment: Post Procedure Mammograms for Marker Placement Electronically Signed   By: Margarette Canada M.D.   On: 09/01/2019 09:23   [place] LT BREAST BX W LOC DEV 1ST LESION IMG BX SPEC US GUIDE  Addendum Date: 09/02/2019   ADDENDUM REPORT: 09/02/2019 13:44 ADDENDUM: Pathology revealed GRADE III INVASIVE DUCTAL CARCINOMA, DUCTAL CARCINOMA IN SITU of the LEFT breast, upper outer, 2 o'clock position. This was found to be concordant by Dr. Hassan Rowan. Pathology results were discussed with the patient by telephone. The patient reported doing well after the biopsy with tenderness at the site. Post biopsy instructions and care were reviewed and questions were answered. The patient was encouraged to call [facility] for any additional concerns. The patient was referred to [facility] on Sep 09, 2019.  Pathology results reported by Stacie Acres RN on 09/02/2019. Electronically Signed   By: Cleatis Polka.D.  On: 09/02/2019 13:44   Result Date: 09/02/2019 CLINICAL DATA:  74 year old female for tissue sampling of 0.5 cm UPPER-OUTER LEFT breast mass. EXAM: ULTRASOUND GUIDED LEFT BREAST CORE NEEDLE BIOPSY COMPARISON:  Previous exam(s). FINDINGS: I met with the patient and we discussed the procedure of ultrasound-guided biopsy, including benefits and alternatives. We discussed the high likelihood of a successful procedure. We discussed the risks of the procedure, including infection, bleeding, tissue injury, clip migration, and inadequate sampling. Informed written consent was given. The usual time-out protocol was performed immediately prior to the procedure. Lesion quadrant: UPPER-OUTER LEFT breast Using sterile technique and 1% Lidocaine as local anesthetic, under direct ultrasound visualization, a 14 gauge spring-loaded device was used to perform biopsy of the 0.5 cm mass at the 2 o'clock position of the LEFT breast 11 cm from the nipple using a SUPERIOR approach. At the conclusion of the procedure a RIBBON tissue marker clip was deployed into the biopsy cavity. Follow up 2 view mammogram was performed and dictated separately. IMPRESSION: Ultrasound guided biopsy of 0.5 cm UPPER-OUTER LEFT breast mass. No apparent complications. Electronically Signed: By: Margarette Canada M.D. On: 09/01/2019 09:20       IMPRESSION/PLAN: 1. Stage IB, [id number] grade 3 triple negative invasive ductal carcinoma of the left breast. Dr. Lisbeth Renshaw discusses the pathology findings and reviews the nature of left breast disease. The consensus from the breast conference includes breast conservation with lumpectomy with  sentinel node biopsy. Depending on the size of the final tumor measurements rendered by pathology, she may benefit from chemotherapy. Once this has been clarified, we would meet back with the patient to discuss whole breast  radiotherapy as it would be recommended to reduce the risks of local recurrence.  We discussed the risks, benefits, short, and long term effects of radiotherapy, and the patient is interested in proceeding. Dr. Lisbeth Renshaw discusses the delivery and logistics of radiotherapy and anticipates a course of 4-6 1/2 weeks of radiotherapy. We will see her back about 2 weeks after surgery if there is no role for systemic therapy, or a few weeks after completion of chemotherapy if indicated to discuss proceeding with simulation process.   In a visit lasting 60 minutes, greater than 50% of the time was spent face to face reviewing her case, as well as in preparation of, discussing, and coordinating the patient's care.  The above documentation reflects my direct findings during this shared patient visit. Please see the separate note by Dr. Lisbeth Renshaw on this date for the remainder of the patient's plan of care.    Carola Rhine, PAC

## 2019-09-09 NOTE — Progress Notes (Signed)
Clinical Social Work [facility] Psychosocial Distress Screening [place]   Patient completed distress screening protocol and scored a 8 on the Psychosocial Distress Thermometer which indicates severe distress. Clinical Education officer, museum met with patient and patient's Spouse, Patrick Jupiter, in [facility] to assess for distress and other psychosocial needs.  Patient stated she was feeling overwhelmed but felt better after meeting with the treatment team and getting more information on her treatment plan.  Has good support from her husband, sister, and two children.   CSW and patient discussed common feeling and emotions when being diagnosed with cancer, and the importance of support during treatment.  CSW informed patient of the support team and support services at [facility].  CSW provided contact information and encouraged patient to call with any questions or concerns.    Distress Screen: ONCBCN DISTRESS SCREENING 09/09/2019  Screening Type Initial Screening  Distress experienced in past week (1-10) 8  Emotional problem type Adjusting to illness;Feeling hopeless  Spiritual/Religous concerns type Facing my mortality  Information Concerns Type Lack of info about diagnosis  Physical Problem type Pain     [place]

## 2019-09-09 NOTE — Assessment & Plan Note (Addendum)
09/01/2019: Screening mammogram detected a left breast asymmetry. Diagnostic mammogram and US showed a 0.5cm mass, 2 o'clock position, with no axillary adenopathy. Biopsy showed IDC, grade 3, HER-2 - (0), ER/PR -, Ki67 50%. T1 a N0 stage Ib clinical stage  Pathology and radiology counseling: Discussed with the patient, the details of pathology including the type of breast cancer,the clinical staging, the significance of ER, PR and HER-2/neu receptors and the implications for treatment. After reviewing the pathology in detail, we proceeded to discuss the different treatment options between surgery, radiation, chemotherapy, antiestrogen therapies.  Recommendation: 1.  Breast conserving surgery with sentinel lymph node biopsy 2.  Adjuvant chemotherapy based upon final size and patient's performance status 3.  Adjuvant radiation therapy  Return to clinic after surgery to discuss pathology report and to finalize the adjuvant treatment plan.

## 2019-09-09 NOTE — H&P (Signed)
History of Present Illness Kim Fuller. Kim Rasmussen MD; 09/09/2019 11:20 AM) The patient is a 74 year old female who presents with breast cancer. Kim Fuller  This is a 74 year old female in reasonably good health who presents after recent routine screening mammogram. This showed some left asymmetry. Further work-up revealed a 0.5 cm invasive ductal carcinoma with some associated DCIS, triple negative, Ki-67 50%. The tumor is located at 0200 on the left 11 cmfn. She presents now to discuss further management.  Negative family history for breast cancer  CLINICAL DATA: Screening.  EXAM: DIGITAL SCREENING BILATERAL MAMMOGRAM WITH TOMO AND CAD  COMPARISON: Previous exam(s).  ACR Breast Density Category c: The breast tissue is heterogeneously dense, which may obscure small masses.  FINDINGS: In the left breast, a possible asymmetry warrants further evaluation. In the right breast, no findings suspicious for malignancy. Images were processed with CAD.  IMPRESSION: Further evaluation is suggested for possible asymmetry in the left breast.  RECOMMENDATION: Diagnostic mammogram and possibly ultrasound of the left breast. (Code:[id number])  The patient will be contacted regarding the findings, and additional imaging will be scheduled.  BI-RADS CATEGORY 0: Incomplete. Need additional imaging evaluation and/or prior mammograms for comparison.   Electronically Signed By: Kim Fuller M.D. On: 08/19/2019 13:39  CLINICAL DATA: 74 year old female recalled from screening mammogram dated 08/19/2019 for a possible left breast asymmetry.  EXAM: DIGITAL DIAGNOSTIC LEFT MAMMOGRAM WITH CAD AND TOMO  ULTRASOUND LEFT BREAST  COMPARISON: Previous exam(s).  ACR Breast Density Category c: The breast tissue is heterogeneously dense, which may obscure small masses.  FINDINGS: A focal asymmetry persists in the upper outer left breast at posterior depth. There is  suggestion of mild associated distortion. Further evaluation with ultrasound was performed.  Mammographic images were processed with CAD.  Targeted ultrasound is performed, showing an irregular, hypoechoic mass at the 2 o'clock position 11 cm from the nipple. It measures 5 x 4 x 4 mm. There is no internal vascularity. This correlates well with the size and location of the mammographic abnormality.  Evaluation of the left axilla demonstrates no suspicious lymphadenopathy.  IMPRESSION: 1. Indeterminate left breast mass. Recommendation is for ultrasound-guided biopsy with careful attention to post clip films to ensure mammographic correlation. 2. No suspicious left axillary lymphadenopathy.  RECOMMENDATION: Ultrasound-guided biopsy of the left breast with careful attention on post clip films to ensure correlation with the mammographically identified focal asymmetry.  I have discussed the findings and recommendations with the patient. If applicable, a reminder letter will be sent to the patient regarding the next appointment.  BI-RADS CATEGORY 4: Suspicious.   Electronically Signed By: Kim Fuller M.D. On: 08/24/2019 11:28  CLINICAL DATA: 74 year old female for tissue sampling of 0.5 cm UPPER-OUTER LEFT breast mass.  EXAM: ULTRASOUND GUIDED LEFT BREAST CORE NEEDLE BIOPSY  COMPARISON: Previous exam(s).  FINDINGS: I met with the patient and we discussed the procedure of ultrasound-guided biopsy, including benefits and alternatives. We discussed the high likelihood of a successful procedure. We discussed the risks of the procedure, including infection, bleeding, tissue injury, clip migration, and inadequate sampling. Informed written consent was given. The usual time-out protocol was performed immediately prior to the procedure.  Lesion quadrant: UPPER-OUTER LEFT breast  Using sterile technique and 1% Lidocaine as local anesthetic, under direct ultrasound  visualization, a 14 gauge spring-loaded device was used to perform biopsy of the 0.5 cm mass at the 2 o'clock position of the LEFT breast 11 cm from the  nipple using a SUPERIOR approach. At the conclusion of the procedure a RIBBON tissue marker clip was deployed into the biopsy cavity. Follow up 2 view mammogram was performed and dictated separately.  IMPRESSION: Ultrasound guided biopsy of 0.5 cm UPPER-OUTER LEFT breast mass. No apparent complications.  Electronically Signed: By: Kim Fuller M.D. On: 09/01/2019 09:20   FINAL for Kim Fuller, Kim Fuller ([id number]) Diagnosis Breast, left, needle core biopsy, upper outer, 2 o'clock position - INVASIVE DUCTAL CARCINOMA. - DUCTAL CARCINOMA IN SITU. - SEE COMMENT. Microscopic Comment The carcinoma appears grade 3. The longest span of tumor is 0.5 cm. A breast prognostic profile will be performed and the results reported separately. Dr. Vicente Males has reviewed the case and concurs with this interpretation. The results are called to The Kim Fuller on 09/02/2019. (JBK:gt, 09/02/2019) Kim Cutter MD Pathologist, Electronic Signature (Case signed 09/02/2019)  PROGNOSTIC INDICATORS Results: IMMUNOHISTOCHEMICAL AND MORPHOMETRIC ANALYSIS PERFORMED MANUALLY The tumor cells are NEGATIVE for Her2 (0). Estrogen Receptor: 0%, NEGATIVE Progesterone Receptor: 0%, NEGATIVE Proliferation Marker Ki67: 50% COMMENT: The negative hormone receptor study(ies) in this case has an internal positive control. REFERENCE RANGE ESTROGEN RECEPTOR NEGATIVE 0% POSITIVE =>1% REFERENCE RANGE PROGESTERONE RECEPTOR NEGATIVE 0% POSITIVE =>1% All controls stained appropriately Kim Cutter MD Pathologist, Electronic Signature ( Signed 09/03/2019)    Past Surgical History Kim Slipper, RN; 09/09/2019 7:52 AM) Breast Biopsy Left. Cataract Surgery Bilateral. Colon Polyp Removal - Colonoscopy Knee Surgery Left. Oral Surgery  Diagnostic  Studies History Kim Slipper, RN; 09/09/2019 7:52 AM) Colonoscopy 5-10 years ago Mammogram within last year Pap Smear >5 years ago  Allergies Kim Key K. Kholton Coate, MD; 09/09/2019 11:17 AM) traMADol HCl *ANALGESICS - OPIOID* Cold sweats, weak, fatigue Sulfa Antibiotics Rash.  Medication History Kim Fuller. Kim Nodal, MD; 09/09/2019 11:17 AM) Medications Reconciled Tylenol (325MG Tablet, Oral) Active. Aspirin (81MG Tablet DR, Oral) Active. Lotensin (40MG Tablet, Oral) Active. Ziac (5-6.25MG Tablet, Oral) Active. Calcium Carbonate (1500 (600 Ca)MG Tablet, Oral) Active. Vitamin D3 (10 MCG(400 UNIT) Tablet, Oral) Active. NexIUM (10MG Packet, Oral) Active. Flonase (50MCG/DOSE Inhaler, Nasal) Active. Multiple Vitamin (1 (one) Oral) Active. Omega 3 (1000MG Capsule, Oral) Active. Pravachol (20MG Tablet, Oral) Active. Systane Complete (0.6% Solution, Ophthalmic) Active. Zanaflex (2MG Capsule, Oral) Active. Vitamin E (400UNIT Capsule, Oral) Active.  Social History Kim Slipper, RN; 09/09/2019 7:52 AM) Alcohol use Occasional alcohol use. Caffeine use Carbonated beverages. No drug use Tobacco use Former smoker.  Family History Kim Slipper, RN; 09/09/2019 7:52 AM) Arthritis Father, Mother. Heart Disease Father. Hypertension Father, Mother. Ischemic Bowel Disease Mother. Melanoma Sister. Respiratory Condition Father, Mother, Sister.  Pregnancy / Birth History Kim Slipper, RN; 09/09/2019 7:52 AM) Age at menarche 57 years. Age of menopause 9-50 Contraceptive History Intrauterine device. Gravida 2 Maternal age 40-20 Para 2  Other Problems Kim Slipper, RN; 09/09/2019 7:52 AM) Arthritis Back Pain Diverticulosis Gastroesophageal Reflux Disease High blood pressure Hypercholesterolemia     Review of Systems Kim Slipper RN; 09/09/2019 7:52 AM) General Present- Weight Gain. Not Present- Appetite Loss, Chills, Fatigue, Fever, Night Sweats and Weight  Loss. Skin Present- Change in Wart/Mole. Not Present- Dryness, Hives, Jaundice, New Lesions, Non-Healing Wounds, Rash and Ulcer. HEENT Present- Seasonal Allergies. Not Present- Earache, Hearing Loss, Hoarseness, Nose Bleed, Oral Ulcers, Ringing in the Ears, Sinus Pain, Sore Throat, Visual Disturbances, Wears glasses/contact lenses and Yellow Eyes. Respiratory Not Present- Bloody sputum, Chronic Cough, Difficulty Breathing, Snoring and Wheezing. Cardiovascular Present- Swelling of Extremities. Not Present- Chest Pain, Difficulty Breathing Lying Down, Leg Cramps, Palpitations, Rapid  Heart Rate and Shortness of Breath. Gastrointestinal Present- Bloating and Excessive gas. Not Present- Abdominal Pain, Bloody Stool, Change in Bowel Habits, Chronic diarrhea, Constipation, Difficulty Swallowing, Gets full quickly at meals, Hemorrhoids, Indigestion, Nausea, Rectal Pain and Vomiting. Female Genitourinary Not Present- Frequency, Nocturia, Painful Urination, Pelvic Pain and Urgency. Musculoskeletal Present- Back Pain and Muscle Pain. Not Present- Joint Pain, Joint Stiffness, Muscle Weakness and Swelling of Extremities. Neurological Present- Numbness. Not Present- Decreased Memory, Fainting, Headaches, Seizures, Tingling, Tremor, Trouble walking and Weakness. Psychiatric Present- Change in Sleep Pattern and Frequent crying. Not Present- Anxiety, Bipolar, Depression and Fearful. Endocrine Present- Hot flashes. Not Present- Cold Intolerance, Excessive Hunger, Hair Changes, Heat Intolerance and New Diabetes. Hematology Present- Blood Thinners. Not Present- Easy Bruising, Excessive bleeding, Gland problems, HIV and Persistent Infections.   Physical Exam Kim Key K. Rolando Hessling MD; 09/09/2019 11:20 AM)  The physical exam findings are as follows: Note:Constitutional: WDWN in NAD, conversant, no obvious deformities; Eyes: Pupils equal, round; sclera anicteric; moist conjunctiva; no lid lag HENT: Oral mucosa moist; good  dentition Neck: No masses palpated, trachea midline; no thyromegaly Lungs: CTA bilaterally; normal respiratory effort Breasts: symmetric, no nipple changes or discharge; no axillary lymphadenopathy on either side; no palpable masses on either side, Slight skin irritation at the biopsy site near the left axilla CV: Regular rate and rhythm; no murmurs; extremities well-perfused with no edema Abd: +bowel sounds, soft, non-tender, no palpable organomegaly; no palpable hernias Musc: Gait is slightly hampered by some left lower back pain; no apparent clubbing or cyanosis in extremities Lymphatic: No palpable cervical or axillary lymphadenopathy Skin: Warm, dry; no sign of jaundice Psychiatric - alert and oriented x 4; calm mood and affect    Assessment & Plan Kim Key K. Raylie Maddison MD; 09/09/2019 11:22 AM)  INVASIVE DUCTAL CARCINOMA OF LEFT BREAST (C50.912) Impression: IDC with DCIS, triple negative 50% L 0200 11 cmfn Measures 0.5 cm  Current Plans Schedule for Surgery - Left radioactive seed localized lumpectomy with sentinel lymph node biopsy. The surgical procedure has been discussed with the patient. Potential risks, benefits, alternative treatments, and expected outcomes have been explained. All of the patient's questions at this time have been answered. The likelihood of reaching the patient's treatment goal is good. The patient understand the proposed surgical procedure and wishes to proceed.   This will be followed by radiation. Chemotherapy will be determined by her final pathology.  Kim Fuller. Georgette Dover, MD, [facility]  General/ Trauma Surgery   09/09/2019 11:23 AM

## 2019-09-09 NOTE — Therapy (Signed)
[place], [place], [address] Phone: [phone]   Fax:  [phone]  Physical Therapy Evaluation  Patient Details  Name: Kim Fuller MRN: [id number] Date of Birth: [birth year] Referring Provider (PT): Dr. Donnie Mesa   Encounter Date: 09/09/2019  PT End of Session - 09/09/19 1313    Visit Number  1    Number of Visits  2    Date for PT Re-Evaluation  11/04/19    PT Start Time  8250    PT Stop Time  1028   Also saw pt from 1043-1107 for a total of 34 minutes   PT Time Calculation (min)  10 min    Activity Tolerance  Patient tolerated treatment well    Behavior During Therapy  [facility] for tasks assessed/performed       Past Medical History:  Diagnosis Date  . Abnormal finding on Pap smear    x1  . Diverticulosis   . GERD (gastroesophageal reflux disease)   . Hyperlipidemia   . Hypertension   . Osteopenia    -1.5 @ femoral neck 11/2007  . Reactive airway disease    years ago ; severe asthma attack "bornchitis asthma " per patient, no issues since     Past Surgical History:  Procedure Laterality Date  . CARDIOVASCULAR STRESS TEST  03/05/06  . CARPAL TUNNEL RELEASE  2009    bilaterally  . CATARACT EXTRACTION, BILATERAL     Dr Gershon Crane  . COLONOSCOPY W/ POLYPECTOMY  1998   Tics @ 2004 & 2012; Dr Carlean Purl  . HAND SURGERY     Trigger thumb  . PARTIAL KNEE ARTHROPLASTY Left 04/03/2017   Procedure: Left knee medial unicompartmental arthroplasty;  Surgeon: Gaynelle Arabian, MD;  Location: [facility];  Service: Orthopedics;  Laterality: Left;  Adductor canal block  . TRIGGER FINGER RELEASE Right   . TUBAL LIGATION      There were no vitals filed for this visit.   Subjective Assessment - 09/09/19 1113    Subjective  Patient reports she is here today to be seen by her medical team for her newly diagnosed left breast cancer.    Patient is accompained by:  Family member    Pertinent History   Patient was diagnosed on 08/19/2019 with left triple negative invasive ductal carcinoma breast cancer. It measures 5 mm and is located in the upper outer quadrant. It is grade III with a Ki67 of 50%. She had a partial left knee replacement in 2018 and some chronic low back pain.    Patient Stated Goals  Reduce lymphedema risk and learn post op shoulder ROM HEP    Currently in Pain?  Yes    Pain Score  4     Pain Location  Back    Pain Orientation  Lower    Pain Descriptors / Indicators  Aching    Pain Type  Chronic pain    Pain Onset  More than a month ago    Pain Frequency  Intermittent    Aggravating Factors   Sit to stand and walking    Pain Relieving Factors  Sitting         [facility] PT Assessment - 09/09/19 0001      Assessment   Medical Diagnosis  Left breast cancer    Referring Provider (PT)  Dr. Donnie Mesa    Onset Date/Surgical Date  08/19/19    Hand Dominance  Right    Prior Therapy  None  Precautions   Precautions  Other (comment)    Precaution Comments  active cancer      Restrictions   Weight Bearing Restrictions  No      Balance Screen   Has the patient fallen in the past 6 months  No    Has the patient had a decrease in activity level because of a fear of falling?   No    Is the patient reluctant to leave their home because of a fear of falling?   No      Home Environment   Living Environment  Private residence    Living Arrangements  Spouse/significant other    Available Help at Discharge  Family      Prior Function   Level of [place]  Retired    Leisure  She stopped walking for exercise due to back pain      Cognition   Overall Cognitive Status  Within Functional Limits for tasks assessed      Posture/Postural Control   Posture/Postural Control  Postural limitations    Postural Limitations  Rounded Shoulders;Forward head      ROM / Strength   AROM / PROM / Strength  AROM;Strength      AROM   Overall AROM  Comments  All cervical AROM limited 25% except flexion is WNL    AROM Assessment Site  Shoulder    Right/Left Shoulder  Right;Left    Right Shoulder Extension  50 Degrees    Right Shoulder Flexion  158 Degrees    Right Shoulder ABduction  156 Degrees    Right Shoulder Internal Rotation  80 Degrees    Right Shoulder External Rotation  77 Degrees    Left Shoulder Extension  44 Degrees    Left Shoulder Flexion  144 Degrees    Left Shoulder ABduction  147 Degrees    Left Shoulder Internal Rotation  80 Degrees    Left Shoulder External Rotation  80 Degrees      Strength   Overall Strength  Within functional limits for tasks performed        LYMPHEDEMA/ONCOLOGY QUESTIONNAIRE - 09/09/19 1244      Type   Cancer Type  Left breast cancer      Lymphedema Assessments   Lymphedema Assessments  Upper extremities      Right Upper Extremity Lymphedema   10 cm Proximal to Olecranon Process  31.8 cm    Olecranon Process  25.4 cm    10 cm Proximal to Ulnar Styloid Process  23.5 cm    Just Proximal to Ulnar Styloid Process  16.2 cm    Across Hand at [employer]  20 cm    At [place] of 2nd Digit  6.6 cm      Left Upper Extremity Lymphedema   10 cm Proximal to Olecranon Process  33.3 cm    Olecranon Process  27 cm    10 cm Proximal to Ulnar Styloid Process  24.5 cm    Just Proximal to Ulnar Styloid Process  16.3 cm    Across Hand at [employer]  19.3 cm    At [place] of 2nd Digit  6.6 cm          Quick Dash - 09/09/19 0001    Open a tight or new jar  Mild difficulty    Do heavy household chores (wash walls, wash floors)  Mild difficulty    Carry a shopping bag or  briefcase  No difficulty    Wash your back  Mild difficulty    Use a knife to cut food  No difficulty    Recreational activities in which you take some force or impact through your arm, shoulder, or hand (golf, hammering, tennis)  No difficulty    During the past week, to what extent has your arm, shoulder or hand problem  interfered with your normal social activities with family, friends, neighbors, or groups?  Not at all    During the past week, to what extent has your arm, shoulder or hand problem limited your work or other regular daily activities  Slightly    Arm, shoulder, or hand pain.  Mild    Tingling (pins and needles) in your arm, shoulder, or hand  Mild    Difficulty Sleeping  No difficulty    DASH Score  13.64 %        Objective measurements completed on examination: See above findings.      Patient was instructed today in a home exercise program today for post op shoulder range of motion. These included active assist shoulder flexion in sitting, scapular retraction, wall walking with shoulder abduction, and hands behind head external rotation.  She was encouraged to do these twice a day, holding 3 seconds and repeating 5 times when permitted by her physician.      PT Education - 09/09/19 1312    Education Details  Lymphedema risk reduction and post op shoulder ROM HEP    Person(s) Educated  Patient;Spouse    Methods  Explanation;Demonstration;Handout    Comprehension  Returned demonstration;Verbalized understanding          PT Long Term Goals - 09/09/19 1325      PT LONG TERM GOAL #1   Title  Patient will demonstrate she has regained full shoulder ROM and function post operatively compared ot baselines.    Time  8    Period  Weeks    Status  New    Target Date  11/04/19      Breast Clinic Goals - 09/09/19 1324      Patient will be able to verbalize understanding of pertinent lymphedema risk reduction practices relevant to her diagnosis specifically related to skin care.   Time  1    Period  Days    Status  Achieved      Patient will be able to return demonstrate and/or verbalize understanding of the post-op home exercise program related to regaining shoulder range of motion.   Time  1    Period  Days    Status  Achieved      Patient will be able to verbalize  understanding of the importance of attending the postoperative After Breast Cancer Class for further lymphedema risk reduction education and therapeutic exercise.   Time  1    Period  Days    Status  Achieved            Plan - 09/09/19 1314    Clinical Impression Statement  Patient was diagnosed on 08/19/2019 with left triple negative invasive ductal carcinoma breast cancer. It measures 5 mm and is located in the upper outer quadrant. It is grade III with a Ki67 of 50%. She had a partial left knee replacement in 2018 and some chronic low back pain. Her multidisciplinary medical team met prior to her assessments to determine a recommended treatment plan. She is planning to have a left lumpectomy and sentinel node biopsy followed by  chemotherapy if final pathology shows the mass is > 5 mm, then radiation. She will benefit from a post op PT reassessment to determine needs, followed be L-Dex screenings every 3 months for 2 years.    Stability/Clinical Decision Making  Stable/Uncomplicated    Clinical Decision Making  Low    Rehab Potential  Excellent    PT Frequency  --   Eval and 1 f/u visit followed by L-Dex screenings every 3 months for 2 years   PT Treatment/Interventions  ADLs/Self Care Home Management;Therapeutic exercise;Patient/family education    PT Next Visit Plan  Will reassess 3-4 weeks post op to determine needs    PT Home Exercise Plan  Post op shoulder ROM HEP    Recommended Other Services  PT for back pain; going to [facility] in [place] and Agree with Plan of Care  Family member/caregiver;Patient    Family Member Consulted  Husband       Patient will benefit from skilled therapeutic intervention in order to improve the following deficits and impairments:  Postural dysfunction, Decreased range of motion, Decreased knowledge of precautions, Impaired UE functional use, Pain  Visit Diagnosis: Malignant neoplasm of upper-outer quadrant of left breast in female,  estrogen receptor negative ([place]) - Plan: PT plan of care cert/re-cert  Abnormal posture - Plan: PT plan of care cert/re-cert   The patient was assessed using the L-Dex machine today to produce a lymphedema index baseline score. The patient will be reassessed on a regular basis (typically every 3 months) to obtain new L-Dex scores. If the score is > 6.5 points away from his/her baseline score indicating onset of subclinical lymphedema, it will be recommended to wear a compression garment for 4 weeks, 12 hours per day and then be reassessed. If the score continues to be > 6.5 points from baseline at reassessment, we will initiate lymphedema treatment. Assessing in this manner has a 95% rate of preventing clinically significant lymphedema.  Patient will follow up at outpatient cancer rehab 3-4 weeks following surgery.  If the patient requires physical therapy at that time, a specific plan will be dictated and sent to the referring physician for approval. The patient was educated today on appropriate basic range of motion exercises to begin post operatively and the importance of attending the After Breast Cancer class following surgery.  Patient was educated today on lymphedema risk reduction practices as it pertains to recommendations that will benefit the patient immediately following surgery.  She verbalized good understanding.      Problem List Patient Active Problem List   Diagnosis Date Noted  . Malignant neoplasm of upper-outer quadrant of left breast in female, estrogen receptor negative ([place]) 09/08/2019  . Acute left-sided low back pain without sciatica 07/06/2019  . Morbid obesity ([place]) 04/24/2019  . Trigger finger, right middle finger 07/02/2017  . Chronic shoulder bursitis, left 11/19/2016  . OA (osteoarthritis) of knee 11/18/2015  . Routine general medical examination at a health care facility 02/20/2015  . GERD 12/31/2007  . Osteopenia 10/31/2007  . HYPERLIPIDEMIA 03/20/2007  .  Essential hypertension 03/20/2007   Annia Friendly, PT 09/09/19 1:28 PM  [place], [place], [address] Phone: [phone]   Fax:  [phone]  Name: Kim Fuller MRN: [id number] Date of Birth: Sep 20, 1945

## 2019-09-09 NOTE — H&P (View-Only) (Signed)
History of Present Illness Kim Fuller. Kim Edling MD; 09/09/2019 11:20 AM) The patient is a 74 year old female who presents with breast cancer. Kim Fuller  This is a 74 year old female in reasonably good health who presents after recent routine screening mammogram. This showed some left asymmetry. Further work-up revealed a 0.5 cm invasive ductal carcinoma with some associated DCIS, triple negative, Ki-67 50%. The tumor is located at 0200 on the left 11 cmfn. She presents now to discuss further management.  Negative family history for breast cancer  CLINICAL DATA: Screening.  EXAM: DIGITAL SCREENING BILATERAL MAMMOGRAM WITH TOMO AND CAD  COMPARISON: Previous exam(s).  ACR Breast Density Category c: The breast tissue is heterogeneously dense, which may obscure small masses.  FINDINGS: In the left breast, a possible asymmetry warrants further evaluation. In the right breast, no findings suspicious for malignancy. Images were processed with CAD.  IMPRESSION: Further evaluation is suggested for possible asymmetry in the left breast.  RECOMMENDATION: Diagnostic mammogram and possibly ultrasound of the left breast. (Code:[id number])  The patient will be contacted regarding the findings, and additional imaging will be scheduled.  BI-RADS CATEGORY 0: Incomplete. Need additional imaging evaluation and/or prior mammograms for comparison.   Electronically Signed By: Kim Fuller M.D. On: 08/19/2019 13:39  CLINICAL DATA: 74 year old female recalled from screening mammogram dated 08/19/2019 for a possible left breast asymmetry.  EXAM: DIGITAL DIAGNOSTIC LEFT MAMMOGRAM WITH CAD AND TOMO  ULTRASOUND LEFT BREAST  COMPARISON: Previous exam(s).  ACR Breast Density Category c: The breast tissue is heterogeneously dense, which may obscure small masses.  FINDINGS: A focal asymmetry persists in the upper outer left breast at posterior depth. There is  suggestion of mild associated distortion. Further evaluation with ultrasound was performed.  Mammographic images were processed with CAD.  Targeted ultrasound is performed, showing an irregular, hypoechoic mass at the 2 o'clock position 11 cm from the nipple. It measures 5 x 4 x 4 mm. There is no internal vascularity. This correlates well with the size and location of the mammographic abnormality.  Evaluation of the left axilla demonstrates no suspicious lymphadenopathy.  IMPRESSION: 1. Indeterminate left breast mass. Recommendation is for ultrasound-guided biopsy with careful attention to post clip films to ensure mammographic correlation. 2. No suspicious left axillary lymphadenopathy.  RECOMMENDATION: Ultrasound-guided biopsy of the left breast with careful attention on post clip films to ensure correlation with the mammographically identified focal asymmetry.  I have discussed the findings and recommendations with the patient. If applicable, a reminder letter will be sent to the patient regarding the next appointment.  BI-RADS CATEGORY 4: Suspicious.   Electronically Signed By: Kim Fuller M.D. On: 08/24/2019 11:28  CLINICAL DATA: 74 year old female for tissue sampling of 0.5 cm UPPER-OUTER LEFT breast mass.  EXAM: ULTRASOUND GUIDED LEFT BREAST CORE NEEDLE BIOPSY  COMPARISON: Previous exam(s).  FINDINGS: I met with the patient and we discussed the procedure of ultrasound-guided biopsy, including benefits and alternatives. We discussed the high likelihood of a successful procedure. We discussed the risks of the procedure, including infection, bleeding, tissue injury, clip migration, and inadequate sampling. Informed written consent was given. The usual time-out protocol was performed immediately prior to the procedure.  Lesion quadrant: UPPER-OUTER LEFT breast  Using sterile technique and 1% Lidocaine as local anesthetic, under direct ultrasound  visualization, a 14 gauge spring-loaded device was used to perform biopsy of the 0.5 cm mass at the 2 o'clock position of the LEFT breast 11 cm from the  nipple using a SUPERIOR approach. At the conclusion of the procedure a RIBBON tissue marker clip was deployed into the biopsy cavity. Follow up 2 view mammogram was performed and dictated separately.  IMPRESSION: Ultrasound guided biopsy of 0.5 cm UPPER-OUTER LEFT breast mass. No apparent complications.  Electronically Signed: By: Kim Fuller M.D. On: 09/01/2019 09:20   FINAL for Kim Fuller ([id number]) Diagnosis Breast, left, needle core biopsy, upper outer, 2 o'clock position - INVASIVE DUCTAL CARCINOMA. - DUCTAL CARCINOMA IN SITU. - SEE COMMENT. Microscopic Comment The carcinoma appears grade 3. The longest span of tumor is 0.5 cm. A breast prognostic profile will be performed and the results reported separately. Kim Fuller has reviewed the case and concurs with this interpretation. The results are called to [facility] on 09/02/2019. (JBK:gt, 09/02/2019) Kim Cutter MD Pathologist, Electronic Signature (Case signed 09/02/2019)  PROGNOSTIC INDICATORS Results: IMMUNOHISTOCHEMICAL AND MORPHOMETRIC ANALYSIS PERFORMED MANUALLY The tumor cells are NEGATIVE for Her2 (0). Estrogen Receptor: 0%, NEGATIVE Progesterone Receptor: 0%, NEGATIVE Proliferation Marker Ki67: 50% COMMENT: The negative hormone receptor study(ies) in this case has an internal positive control. REFERENCE RANGE ESTROGEN RECEPTOR NEGATIVE 0% POSITIVE =>1% REFERENCE RANGE PROGESTERONE RECEPTOR NEGATIVE 0% POSITIVE =>1% All controls stained appropriately Kim Cutter MD Pathologist, Electronic Signature ( Signed 09/03/2019)    Past Surgical History Kim Slipper, RN; 09/09/2019 7:52 AM) Breast Biopsy Left. Cataract Surgery Bilateral. Colon Polyp Removal - Colonoscopy Knee Surgery Left. Oral Surgery  Diagnostic  Studies History Kim Slipper, RN; 09/09/2019 7:52 AM) Colonoscopy 5-10 years ago Mammogram within last year Pap Smear >5 years ago  Allergies Kim Key K. Adisen Bennion, MD; 09/09/2019 11:17 AM) traMADol HCl *ANALGESICS - OPIOID* Cold sweats, weak, fatigue Sulfa Antibiotics Rash.  Medication History Kim Fuller. Kim Weitman, MD; 09/09/2019 11:17 AM) Medications Reconciled Tylenol (325MG Tablet, Oral) Active. Aspirin (81MG Tablet DR, Oral) Active. Lotensin (40MG Tablet, Oral) Active. Ziac (5-6.25MG Tablet, Oral) Active. Calcium Carbonate (1500 (600 Ca)MG Tablet, Oral) Active. Vitamin D3 (10 MCG(400 UNIT) Tablet, Oral) Active. NexIUM (10MG Packet, Oral) Active. Flonase (50MCG/DOSE Inhaler, Nasal) Active. Multiple Vitamin (1 (one) Oral) Active. Omega 3 (1000MG Capsule, Oral) Active. Pravachol (20MG Tablet, Oral) Active. Systane Complete (0.6% Solution, Ophthalmic) Active. Zanaflex (2MG Capsule, Oral) Active. Vitamin E (400UNIT Capsule, Oral) Active.  Social History Kim Slipper, RN; 09/09/2019 7:52 AM) Alcohol use Occasional alcohol use. Caffeine use Carbonated beverages. No drug use Tobacco use Former smoker.  Family History Kim Slipper, RN; 09/09/2019 7:52 AM) Arthritis Father, Mother. Heart Disease Father. Hypertension Father, Mother. Ischemic Bowel Disease Mother. Melanoma Sister. Respiratory Condition Father, Mother, Sister.  Pregnancy / Birth History Kim Slipper, RN; 09/09/2019 7:52 AM) Age at menarche 57 years. Age of menopause 9-50 Contraceptive History Intrauterine device. Gravida 2 Maternal age 40-20 Para 2  Other Problems Kim Slipper, RN; 09/09/2019 7:52 AM) Arthritis Back Pain Diverticulosis Gastroesophageal Reflux Disease High blood pressure Hypercholesterolemia     Review of Systems Kim Slipper RN; 09/09/2019 7:52 AM) General Present- Weight Gain. Not Present- Appetite Loss, Chills, Fatigue, Fever, Night Sweats and Weight  Loss. Skin Present- Change in Wart/Mole. Not Present- Dryness, Hives, Jaundice, New Lesions, Non-Healing Wounds, Rash and Ulcer. HEENT Present- Seasonal Allergies. Not Present- Earache, Hearing Loss, Hoarseness, Nose Bleed, Oral Ulcers, Ringing in the Ears, Sinus Pain, Sore Throat, Visual Disturbances, Wears glasses/contact lenses and Yellow Eyes. Respiratory Not Present- Bloody sputum, Chronic Cough, Difficulty Breathing, Snoring and Wheezing. Cardiovascular Present- Swelling of Extremities. Not Present- Chest Pain, Difficulty Breathing Lying Down, Leg Cramps, Palpitations, Rapid  Heart Rate and Shortness of Breath. Gastrointestinal Present- Bloating and Excessive gas. Not Present- Abdominal Pain, Bloody Stool, Change in Bowel Habits, Chronic diarrhea, Constipation, Difficulty Swallowing, Gets full quickly at meals, Hemorrhoids, Indigestion, Nausea, Rectal Pain and Vomiting. Female Genitourinary Not Present- Frequency, Nocturia, Painful Urination, Pelvic Pain and Urgency. Musculoskeletal Present- Back Pain and Muscle Pain. Not Present- Joint Pain, Joint Stiffness, Muscle Weakness and Swelling of Extremities. Neurological Present- Numbness. Not Present- Decreased Memory, Fainting, Headaches, Seizures, Tingling, Tremor, Trouble walking and Weakness. Psychiatric Present- Change in Sleep Pattern and Frequent crying. Not Present- Anxiety, Bipolar, Depression and Fearful. Endocrine Present- Hot flashes. Not Present- Cold Intolerance, Excessive Hunger, Hair Changes, Heat Intolerance and New Diabetes. Hematology Present- Blood Thinners. Not Present- Easy Bruising, Excessive bleeding, Gland problems, HIV and Persistent Infections.   Physical Exam Kim Key K. Shelitha Magley MD; 09/09/2019 11:20 AM)  The physical exam findings are as follows: Note:Constitutional: WDWN in NAD, conversant, no obvious deformities; Eyes: Pupils equal, round; sclera anicteric; moist conjunctiva; no lid lag HENT: Oral mucosa moist; good  dentition Neck: No masses palpated, trachea midline; no thyromegaly Lungs: CTA bilaterally; normal respiratory effort Breasts: symmetric, no nipple changes or discharge; no axillary lymphadenopathy on either side; no palpable masses on either side, Slight skin irritation at the biopsy site near the left axilla CV: Regular rate and rhythm; no murmurs; extremities well-perfused with no edema Abd: +bowel sounds, soft, non-tender, no palpable organomegaly; no palpable hernias Musc: Gait is slightly hampered by some left lower back pain; no apparent clubbing or cyanosis in extremities Lymphatic: No palpable cervical or axillary lymphadenopathy Skin: Warm, dry; no sign of jaundice Psychiatric - alert and oriented x 4; calm mood and affect    Assessment & Plan Kim Key K. Fatemah Pourciau MD; 09/09/2019 11:22 AM)  INVASIVE DUCTAL CARCINOMA OF LEFT BREAST (C50.912) Impression: IDC with DCIS, triple negative 50% L 0200 11 cmfn Measures 0.5 cm  Current Plans Schedule for Surgery - Left radioactive seed localized lumpectomy with sentinel lymph node biopsy. The surgical procedure has been discussed with the patient. Potential risks, benefits, alternative treatments, and expected outcomes have been explained. All of the patient's questions at this time have been answered. The likelihood of reaching the patient's treatment goal is good. The patient understand the proposed surgical procedure and wishes to proceed.   This will be followed by radiation. Chemotherapy will be determined by her final pathology.  Kim Fuller. Georgette Dover, MD, [facility]  General/ Trauma Surgery   09/09/2019 11:23 AM

## 2019-09-09 NOTE — H&P (View-Only) (Signed)
History of Present Illness Kim Fuller. Kim Stanco MD; 09/09/2019 11:20 AM) The patient is a 74 year old female who presents with breast cancer. Kim Fuller  This is a 74 year old female in reasonably good health who presents after recent routine screening mammogram. This showed some left asymmetry. Further work-up revealed a 0.5 cm invasive ductal carcinoma with some associated DCIS, triple negative, Ki-67 50%. The tumor is located at 0200 on the left 11 cmfn. She presents now to discuss further management.  Negative family history for breast cancer  CLINICAL DATA: Screening.  EXAM: DIGITAL SCREENING BILATERAL MAMMOGRAM WITH TOMO AND CAD  COMPARISON: Previous exam(s).  ACR Breast Density Category c: The breast tissue is heterogeneously dense, which may obscure small masses.  FINDINGS: In the left breast, a possible asymmetry warrants further evaluation. In the right breast, no findings suspicious for malignancy. Images were processed with CAD.  IMPRESSION: Further evaluation is suggested for possible asymmetry in the left breast.  RECOMMENDATION: Diagnostic mammogram and possibly ultrasound of the left breast. (Code:[id number])  The patient will be contacted regarding the findings, and additional imaging will be scheduled.  BI-RADS CATEGORY 0: Incomplete. Need additional imaging evaluation and/or prior mammograms for comparison.   Electronically Signed By: Lovey Newcomer M.D. On: 08/19/2019 13:39  CLINICAL DATA: 74 year old female recalled from screening mammogram dated 08/19/2019 for a possible left breast asymmetry.  EXAM: DIGITAL DIAGNOSTIC LEFT MAMMOGRAM WITH CAD AND TOMO  ULTRASOUND LEFT BREAST  COMPARISON: Previous exam(s).  ACR Breast Density Category c: The breast tissue is heterogeneously dense, which may obscure small masses.  FINDINGS: A focal asymmetry persists in the upper outer left breast at posterior depth. There is  suggestion of mild associated distortion. Further evaluation with ultrasound was performed.  Mammographic images were processed with CAD.  Targeted ultrasound is performed, showing an irregular, hypoechoic mass at the 2 o'clock position 11 cm from the nipple. It measures 5 x 4 x 4 mm. There is no internal vascularity. This correlates well with the size and location of the mammographic abnormality.  Evaluation of the left axilla demonstrates no suspicious lymphadenopathy.  IMPRESSION: 1. Indeterminate left breast mass. Recommendation is for ultrasound-guided biopsy with careful attention to post clip films to ensure mammographic correlation. 2. No suspicious left axillary lymphadenopathy.  RECOMMENDATION: Ultrasound-guided biopsy of the left breast with careful attention on post clip films to ensure correlation with the mammographically identified focal asymmetry.  I have discussed the findings and recommendations with the patient. If applicable, a reminder letter will be sent to the patient regarding the next appointment.  BI-RADS CATEGORY 4: Suspicious.   Electronically Signed By: Kristopher Oppenheim M.D. On: 08/24/2019 11:28  CLINICAL DATA: 74 year old female for tissue sampling of 0.5 cm UPPER-OUTER LEFT breast mass.  EXAM: ULTRASOUND GUIDED LEFT BREAST CORE NEEDLE BIOPSY  COMPARISON: Previous exam(s).  FINDINGS: I met with the patient and we discussed the procedure of ultrasound-guided biopsy, including benefits and alternatives. We discussed the high likelihood of a successful procedure. We discussed the risks of the procedure, including infection, bleeding, tissue injury, clip migration, and inadequate sampling. Informed written consent was given. The usual time-out protocol was performed immediately prior to the procedure.  Lesion quadrant: UPPER-OUTER LEFT breast  Using sterile technique and 1% Lidocaine as local anesthetic, under direct ultrasound  visualization, a 14 gauge spring-loaded device was used to perform biopsy of the 0.5 cm mass at the 2 o'clock position of the LEFT breast 11 cm from the  nipple using a SUPERIOR approach. At the conclusion of the procedure a RIBBON tissue marker clip was deployed into the biopsy cavity. Follow up 2 view mammogram was performed and dictated separately.  IMPRESSION: Ultrasound guided biopsy of 0.5 cm UPPER-OUTER LEFT breast mass. No apparent complications.  Electronically Signed: By: Margarette Canada M.D. On: 09/01/2019 09:20   FINAL for Kim Fuller, Kim Fuller ([id number]) Diagnosis Breast, left, needle core biopsy, upper outer, 2 o'clock position - INVASIVE DUCTAL CARCINOMA. - DUCTAL CARCINOMA IN SITU. - SEE COMMENT. Microscopic Comment The carcinoma appears grade 3. The longest span of tumor is 0.5 cm. A breast prognostic profile will be performed and the results reported separately. Dr. Vicente Males has reviewed the case and concurs with this interpretation. The results are called to [facility] on 09/02/2019. (JBK:gt, 09/02/2019) Kim Cutter MD Pathologist, Electronic Signature (Case signed 09/02/2019)  PROGNOSTIC INDICATORS Results: IMMUNOHISTOCHEMICAL AND MORPHOMETRIC ANALYSIS PERFORMED MANUALLY The tumor cells are NEGATIVE for Her2 (0). Estrogen Receptor: 0%, NEGATIVE Progesterone Receptor: 0%, NEGATIVE Proliferation Marker Ki67: 50% COMMENT: The negative hormone receptor study(ies) in this case has an internal positive control. REFERENCE RANGE ESTROGEN RECEPTOR NEGATIVE 0% POSITIVE =>1% REFERENCE RANGE PROGESTERONE RECEPTOR NEGATIVE 0% POSITIVE =>1% All controls stained appropriately Kim Cutter MD Pathologist, Electronic Signature ( Signed 09/03/2019)    Past Surgical History Kim Slipper, RN; 09/09/2019 7:52 AM) Breast Biopsy Left. Cataract Surgery Bilateral. Colon Polyp Removal - Colonoscopy Knee Surgery Left. Oral Surgery  Diagnostic  Studies History Kim Slipper, RN; 09/09/2019 7:52 AM) Colonoscopy 5-10 years ago Mammogram within last year Pap Smear >5 years ago  Allergies Kim Key K. Cordelle Dahmen, MD; 09/09/2019 11:17 AM) traMADol HCl *ANALGESICS - OPIOID* Cold sweats, weak, fatigue Sulfa Antibiotics Rash.  Medication History Kim Fuller. Kim Goens, MD; 09/09/2019 11:17 AM) Medications Reconciled Tylenol (325MG Tablet, Oral) Active. Aspirin (81MG Tablet DR, Oral) Active. Lotensin (40MG Tablet, Oral) Active. Ziac (5-6.25MG Tablet, Oral) Active. Calcium Carbonate (1500 (600 Ca)MG Tablet, Oral) Active. Vitamin D3 (10 MCG(400 UNIT) Tablet, Oral) Active. NexIUM (10MG Packet, Oral) Active. Flonase (50MCG/DOSE Inhaler, Nasal) Active. Multiple Vitamin (1 (one) Oral) Active. Omega 3 (1000MG Capsule, Oral) Active. Pravachol (20MG Tablet, Oral) Active. Systane Complete (0.6% Solution, Ophthalmic) Active. Zanaflex (2MG Capsule, Oral) Active. Vitamin E (400UNIT Capsule, Oral) Active.  Social History Kim Slipper, RN; 09/09/2019 7:52 AM) Alcohol use Occasional alcohol use. Caffeine use Carbonated beverages. No drug use Tobacco use Former smoker.  Family History Kim Slipper, RN; 09/09/2019 7:52 AM) Arthritis Father, Mother. Heart Disease Father. Hypertension Father, Mother. Ischemic Bowel Disease Mother. Melanoma Sister. Respiratory Condition Father, Mother, Sister.  Pregnancy / Birth History Kim Slipper, RN; 09/09/2019 7:52 AM) Age at menarche 57 years. Age of menopause 9-50 Contraceptive History Intrauterine device. Gravida 2 Maternal age 40-20 Para 2  Other Problems Kim Slipper, RN; 09/09/2019 7:52 AM) Arthritis Back Pain Diverticulosis Gastroesophageal Reflux Disease High blood pressure Hypercholesterolemia     Review of Systems Kim Slipper RN; 09/09/2019 7:52 AM) General Present- Weight Gain. Not Present- Appetite Loss, Chills, Fatigue, Fever, Night Sweats and Weight  Loss. Skin Present- Change in Wart/Mole. Not Present- Dryness, Hives, Jaundice, New Lesions, Non-Healing Wounds, Rash and Ulcer. HEENT Present- Seasonal Allergies. Not Present- Earache, Hearing Loss, Hoarseness, Nose Bleed, Oral Ulcers, Ringing in the Ears, Sinus Pain, Sore Throat, Visual Disturbances, Wears glasses/contact lenses and Yellow Eyes. Respiratory Not Present- Bloody sputum, Chronic Cough, Difficulty Breathing, Snoring and Wheezing. Cardiovascular Present- Swelling of Extremities. Not Present- Chest Pain, Difficulty Breathing Lying Down, Leg Cramps, Palpitations, Rapid  Heart Rate and Shortness of Breath. Gastrointestinal Present- Bloating and Excessive gas. Not Present- Abdominal Pain, Bloody Stool, Change in Bowel Habits, Chronic diarrhea, Constipation, Difficulty Swallowing, Gets full quickly at meals, Hemorrhoids, Indigestion, Nausea, Rectal Pain and Vomiting. Female Genitourinary Not Present- Frequency, Nocturia, Painful Urination, Pelvic Pain and Urgency. Musculoskeletal Present- Back Pain and Muscle Pain. Not Present- Joint Pain, Joint Stiffness, Muscle Weakness and Swelling of Extremities. Neurological Present- Numbness. Not Present- Decreased Memory, Fainting, Headaches, Seizures, Tingling, Tremor, Trouble walking and Weakness. Psychiatric Present- Change in Sleep Pattern and Frequent crying. Not Present- Anxiety, Bipolar, Depression and Fearful. Endocrine Present- Hot flashes. Not Present- Cold Intolerance, Excessive Hunger, Hair Changes, Heat Intolerance and New Diabetes. Hematology Present- Blood Thinners. Not Present- Easy Bruising, Excessive bleeding, Gland problems, HIV and Persistent Infections.   Physical Exam Kim Key K. Reonna Finlayson MD; 09/09/2019 11:20 AM)  The physical exam findings are as follows: Note:Constitutional: WDWN in NAD, conversant, no obvious deformities; Eyes: Pupils equal, round; sclera anicteric; moist conjunctiva; no lid lag HENT: Oral mucosa moist; good  dentition Neck: No masses palpated, trachea midline; no thyromegaly Lungs: CTA bilaterally; normal respiratory effort Breasts: symmetric, no nipple changes or discharge; no axillary lymphadenopathy on either side; no palpable masses on either side, Slight skin irritation at the biopsy site near the left axilla CV: Regular rate and rhythm; no murmurs; extremities well-perfused with no edema Abd: +bowel sounds, soft, non-tender, no palpable organomegaly; no palpable hernias Musc: Gait is slightly hampered by some left lower back pain; no apparent clubbing or cyanosis in extremities Lymphatic: No palpable cervical or axillary lymphadenopathy Skin: Warm, dry; no sign of jaundice Psychiatric - alert and oriented x 4; calm mood and affect    Assessment & Plan Kim Key K. Kameria Canizares MD; 09/09/2019 11:22 AM)  INVASIVE DUCTAL CARCINOMA OF LEFT BREAST (C50.912) Impression: IDC with DCIS, triple negative 50% L 0200 11 cmfn Measures 0.5 cm  Current Plans Schedule for Surgery - Left radioactive seed localized lumpectomy with sentinel lymph node biopsy. The surgical procedure has been discussed with the patient. Potential risks, benefits, alternative treatments, and expected outcomes have been explained. All of the patient's questions at this time have been answered. The likelihood of reaching the patient's treatment goal is good. The patient understand the proposed surgical procedure and wishes to proceed.   This will be followed by radiation. Chemotherapy will be determined by her final pathology.  Kim Fuller. Kim Dover, MD, [facility]  General/ Trauma Surgery   09/09/2019 11:23 AM

## 2019-09-10 ENCOUNTER — Other Ambulatory Visit: Payer: Self-pay | Admitting: Surgery

## 2019-09-10 DIAGNOSIS — C50912 Malignant neoplasm of unspecified site of left female breast: Secondary | ICD-10-CM

## 2019-09-11 ENCOUNTER — Encounter (HOSPITAL_BASED_OUTPATIENT_CLINIC_OR_DEPARTMENT_OTHER): Payer: Self-pay | Admitting: Surgery

## 2019-09-11 ENCOUNTER — Other Ambulatory Visit: Payer: Self-pay

## 2019-09-12 ENCOUNTER — Other Ambulatory Visit (HOSPITAL_COMMUNITY)
Admission: RE | Admit: 2019-09-12 | Discharge: 2019-09-12 | Disposition: A | Discharge status: Home / Self Care | Payer: Medicare Other | Source: Ambulatory Visit | Attending: Surgery | Admitting: Surgery

## 2019-09-12 DIAGNOSIS — Z20822 Contact with and (suspected) exposure to covid-19: Secondary | ICD-10-CM | POA: Insufficient documentation

## 2019-09-12 DIAGNOSIS — Z01812 Encounter for preprocedural laboratory examination: Secondary | ICD-10-CM | POA: Insufficient documentation

## 2019-09-12 LAB — SARS CORONAVIRUS 2 (TAT 6-24 HRS): SARS Coronavirus 2: NEGATIVE

## 2019-09-15 ENCOUNTER — Other Ambulatory Visit: Payer: Self-pay

## 2019-09-15 ENCOUNTER — Telehealth: Payer: Self-pay

## 2019-09-15 ENCOUNTER — Encounter (HOSPITAL_BASED_OUTPATIENT_CLINIC_OR_DEPARTMENT_OTHER)
Admission: RE | Admit: 2019-09-15 | Discharge: 2019-09-15 | Disposition: A | Discharge status: Home / Self Care | Payer: Medicare Other | Source: Ambulatory Visit | Attending: Surgery | Admitting: Surgery

## 2019-09-15 ENCOUNTER — Ambulatory Visit
Admission: RE | Admit: 2019-09-15 | Discharge: 2019-09-15 | Disposition: A | Discharge status: Home / Self Care | Payer: Medicare Other | Source: Ambulatory Visit | Attending: Surgery | Admitting: Surgery

## 2019-09-15 DIAGNOSIS — Z01818 Encounter for other preprocedural examination: Secondary | ICD-10-CM | POA: Diagnosis not present

## 2019-09-15 DIAGNOSIS — C50912 Malignant neoplasm of unspecified site of left female breast: Secondary | ICD-10-CM

## 2019-09-15 DIAGNOSIS — R928 Other abnormal and inconclusive findings on diagnostic imaging of breast: Secondary | ICD-10-CM | POA: Diagnosis not present

## 2019-09-15 LAB — BASIC METABOLIC PANEL
Anion gap: 8 (ref 5–15)
BUN: 15 mg/dL (ref 8–23)
CO2: 29 mmol/L (ref 22–32)
Calcium: 9.3 mg/dL (ref 8.9–10.3)
Chloride: 104 mmol/L (ref 98–111)
Creatinine, Ser: 0.79 mg/dL (ref 0.44–1.00)
GFR calc Af Amer: >60 mL/min (ref >60)
GFR calc non Af Amer: >60 mL/min (ref >60)
Glucose, Bld: 99 mg/dL (ref 70–99)
Potassium: 5.3 mmol/L — ABNORMAL HIGH (ref 3.5–5.1)
Sodium: 141 mmol/L (ref 135–145)

## 2019-09-15 NOTE — Progress Notes (Signed)
      Enhanced Recovery after Surgery for Orthopedics Enhanced Recovery after Surgery is a protocol used to improve the stress on your body and your recovery after surgery.  Patient Instructions  . The night before surgery:  o No food after midnight. ONLY clear liquids after midnight  . The day of surgery (if you do NOT have diabetes):  o Drink ONE (1) Pre-Surgery Clear Ensure as directed.   o This drink was given to you during your hospital  pre-op appointment visit. o The pre-op nurse will instruct you on the time to drink the  Pre-Surgery Ensure depending on your surgery time. o Finish the drink at the designated time by the pre-op nurse.  o Nothing else to drink after completing the  Pre-Surgery Clear Ensure.  . The day of surgery (if you have diabetes): o Drink ONE (1) Gatorade 2 (G2) as directed. o This drink was given to you during your hospital  pre-op appointment visit.  o The pre-op nurse will instruct you on the time to drink the   Gatorade 2 (G2) depending on your surgery time. o Color of the Gatorade may vary. Red is not allowed. o Nothing else to drink after completing the  Gatorade 2 (G2).         If you have questions, please contact your surgeon's office.   BODY WASH-CHG given to pt. With instructions today as well. Pt verbalized understanding

## 2019-09-15 NOTE — Telephone Encounter (Signed)
Nutrition  Patient attended Breast Clinic on 09/09/19 and was given nutrition packet by nurse navigator.    Chart reviewed.   74 year old female with triple negative breast cancer.  Planning surgery, adjuvant chemotherapy and radiation therapy.    Ht: 62 inches Wt: 213 lb BMI: 39  Called to introduce self and service at [facility].  No answer.  Left message with call back number.   Saudia Smyser B. Zenia Resides, [place], [place] Registered Dietitian [phone] (pager)

## 2019-09-16 ENCOUNTER — Ambulatory Visit (HOSPITAL_BASED_OUTPATIENT_CLINIC_OR_DEPARTMENT_OTHER): Payer: Medicare Other | Admitting: Anesthesiology

## 2019-09-16 ENCOUNTER — Other Ambulatory Visit: Payer: Self-pay

## 2019-09-16 ENCOUNTER — Encounter (HOSPITAL_BASED_OUTPATIENT_CLINIC_OR_DEPARTMENT_OTHER): Payer: Self-pay | Admitting: Surgery

## 2019-09-16 ENCOUNTER — Ambulatory Visit
Admission: RE | Admit: 2019-09-16 | Discharge: 2019-09-16 | Disposition: A | Discharge status: Home / Self Care | Payer: Medicare Other | Source: Ambulatory Visit | Attending: Surgery | Admitting: Surgery

## 2019-09-16 ENCOUNTER — Ambulatory Visit (HOSPITAL_BASED_OUTPATIENT_CLINIC_OR_DEPARTMENT_OTHER)
Admission: RE | Admit: 2019-09-16 | Discharge: 2019-09-16 | Disposition: A | Discharge status: Home / Self Care | Payer: Medicare Other | Attending: Surgery | Admitting: Surgery

## 2019-09-16 ENCOUNTER — Encounter (HOSPITAL_BASED_OUTPATIENT_CLINIC_OR_DEPARTMENT_OTHER)
Admission: RE | Disposition: A | Discharge status: Home / Self Care | Payer: Self-pay | Source: Home / Self Care | Attending: Surgery

## 2019-09-16 ENCOUNTER — Ambulatory Visit (HOSPITAL_COMMUNITY)
Admission: RE | Admit: 2019-09-16 | Discharge: 2019-09-16 | Disposition: A | Discharge status: Home / Self Care | Payer: Medicare Other | Source: Ambulatory Visit | Attending: Surgery | Admitting: Surgery

## 2019-09-16 DIAGNOSIS — Z79899 Other long term (current) drug therapy: Secondary | ICD-10-CM | POA: Insufficient documentation

## 2019-09-16 DIAGNOSIS — Z87891 Personal history of nicotine dependence: Secondary | ICD-10-CM | POA: Insufficient documentation

## 2019-09-16 DIAGNOSIS — K219 Gastro-esophageal reflux disease without esophagitis: Secondary | ICD-10-CM | POA: Diagnosis not present

## 2019-09-16 DIAGNOSIS — M1990 Unspecified osteoarthritis, unspecified site: Secondary | ICD-10-CM | POA: Diagnosis not present

## 2019-09-16 DIAGNOSIS — Z882 Allergy status to sulfonamides status: Secondary | ICD-10-CM | POA: Diagnosis not present

## 2019-09-16 DIAGNOSIS — E7800 Pure hypercholesterolemia, unspecified: Secondary | ICD-10-CM | POA: Insufficient documentation

## 2019-09-16 DIAGNOSIS — I10 Essential (primary) hypertension: Secondary | ICD-10-CM | POA: Diagnosis not present

## 2019-09-16 DIAGNOSIS — Z171 Estrogen receptor negative status [ER-]: Secondary | ICD-10-CM | POA: Insufficient documentation

## 2019-09-16 DIAGNOSIS — C50912 Malignant neoplasm of unspecified site of left female breast: Secondary | ICD-10-CM

## 2019-09-16 DIAGNOSIS — C50412 Malignant neoplasm of upper-outer quadrant of left female breast: Secondary | ICD-10-CM | POA: Diagnosis not present

## 2019-09-16 DIAGNOSIS — R928 Other abnormal and inconclusive findings on diagnostic imaging of breast: Secondary | ICD-10-CM | POA: Diagnosis not present

## 2019-09-16 DIAGNOSIS — Z7982 Long term (current) use of aspirin: Secondary | ICD-10-CM | POA: Insufficient documentation

## 2019-09-16 DIAGNOSIS — Z885 Allergy status to narcotic agent status: Secondary | ICD-10-CM | POA: Insufficient documentation

## 2019-09-16 DIAGNOSIS — G8918 Other acute postprocedural pain: Secondary | ICD-10-CM | POA: Diagnosis not present

## 2019-09-16 DIAGNOSIS — E785 Hyperlipidemia, unspecified: Secondary | ICD-10-CM | POA: Diagnosis not present

## 2019-09-16 HISTORY — PX: BREAST LUMPECTOMY WITH RADIOACTIVE SEED AND SENTINEL LYMPH NODE BIOPSY: SHX6550

## 2019-09-16 SURGERY — BREAST LUMPECTOMY WITH RADIOACTIVE SEED AND SENTINEL LYMPH NODE BIOPSY
Anesthesia: General | Site: Breast | Laterality: Left

## 2019-09-16 MED ORDER — FENTANYL CITRATE (PF) 100 MCG/2ML IJ SOLN
INTRAMUSCULAR | Status: DC | PRN
  Administered 2019-09-16 (×2): 25 ug via INTRAVENOUS

## 2019-09-16 MED ORDER — SODIUM CHLORIDE (PF) 0.9 % IJ SOLN
INTRAVENOUS | Status: DC | PRN
  Administered 2019-09-16: 5 mL via INTRAMUSCULAR

## 2019-09-16 MED ORDER — CHLORHEXIDINE GLUCONATE CLOTH 2 % EX PADS: 6.0000 | MEDICATED_PAD | Freq: Once | CUTANEOUS | Status: DC

## 2019-09-16 MED ORDER — HYDROCODONE-ACETAMINOPHEN 5-325 MG PO TABS
1.0000 | ORAL_TABLET | Freq: Four times a day (QID) | ORAL | 0 refills | Status: DC | PRN
Start: 2019-09-16 — End: 2019-10-15

## 2019-09-16 MED ORDER — FENTANYL CITRATE (PF) 100 MCG/2ML IJ SOLN
100.0000 ug | Freq: Once | INTRAMUSCULAR | Status: AC
  Administered 2019-09-16: 50 ug via INTRAVENOUS

## 2019-09-16 MED ORDER — LACTATED RINGERS IV SOLN: INTRAVENOUS | Status: DC

## 2019-09-16 MED ORDER — FENTANYL CITRATE (PF) 100 MCG/2ML IJ SOLN
INTRAMUSCULAR | Status: AC
  Filled 2019-09-16: qty 2

## 2019-09-16 MED ORDER — DEXAMETHASONE SODIUM PHOSPHATE 10 MG/ML IJ SOLN
INTRAMUSCULAR | Status: DC | PRN
  Administered 2019-09-16: 5 mg via INTRAVENOUS

## 2019-09-16 MED ORDER — MIDAZOLAM HCL 2 MG/2ML IJ SOLN
INTRAMUSCULAR | Status: AC
  Filled 2019-09-16: qty 2

## 2019-09-16 MED ORDER — CEFAZOLIN SODIUM-DEXTROSE 2-4 GM/100ML-% IV SOLN
2.0000 g | INTRAVENOUS | Status: AC
  Administered 2019-09-16: 2 g via INTRAVENOUS

## 2019-09-16 MED ORDER — MIDAZOLAM HCL 2 MG/2ML IJ SOLN
2.0000 mg | Freq: Once | INTRAMUSCULAR | Status: AC
  Administered 2019-09-16: 1 mg via INTRAVENOUS

## 2019-09-16 MED ORDER — DEXAMETHASONE SODIUM PHOSPHATE 10 MG/ML IJ SOLN
INTRAMUSCULAR | Status: AC
  Filled 2019-09-16: qty 1

## 2019-09-16 MED ORDER — ONDANSETRON HCL 4 MG/2ML IJ SOLN
INTRAMUSCULAR | Status: AC
  Filled 2019-09-16: qty 2

## 2019-09-16 MED ORDER — TECHNETIUM TC 99M SULFUR COLLOID FILTERED
1.0000 | Freq: Once | INTRAVENOUS | Status: AC | PRN
  Administered 2019-09-16: 1 via INTRADERMAL

## 2019-09-16 MED ORDER — FENTANYL CITRATE (PF) 100 MCG/2ML IJ SOLN
25.0000 ug | INTRAMUSCULAR | Status: DC | PRN
  Administered 2019-09-16: 25 ug via INTRAVENOUS
  Administered 2019-09-16: 50 ug via INTRAVENOUS

## 2019-09-16 MED ORDER — PHENYLEPHRINE 40 MCG/ML (10ML) SYRINGE FOR IV PUSH (FOR BLOOD PRESSURE SUPPORT)
PREFILLED_SYRINGE | INTRAVENOUS | Status: AC
  Filled 2019-09-16: qty 30

## 2019-09-16 MED ORDER — ONDANSETRON HCL 4 MG/2ML IJ SOLN
INTRAMUSCULAR | Status: DC | PRN
  Administered 2019-09-16: 4 mg via INTRAVENOUS

## 2019-09-16 MED ORDER — ACETAMINOPHEN 325 MG PO TABS: 325.0000 mg | ORAL_TABLET | Freq: Once | ORAL | Status: DC | PRN

## 2019-09-16 MED ORDER — MEPERIDINE HCL 25 MG/ML IJ SOLN: 6.2500 mg | INTRAMUSCULAR | Status: DC | PRN

## 2019-09-16 MED ORDER — ACETAMINOPHEN 160 MG/5ML PO SOLN: 325.0000 mg | Freq: Once | ORAL | Status: DC | PRN

## 2019-09-16 MED ORDER — LIDOCAINE 2% (20 MG/ML) 5 ML SYRINGE
INTRAMUSCULAR | Status: DC | PRN
  Administered 2019-09-16: 60 mg via INTRAVENOUS

## 2019-09-16 MED ORDER — ACETAMINOPHEN 500 MG PO TABS
ORAL_TABLET | ORAL | Status: AC
  Filled 2019-09-16: qty 2

## 2019-09-16 MED ORDER — GABAPENTIN 300 MG PO CAPS
300.0000 mg | ORAL_CAPSULE | ORAL | Status: AC
  Administered 2019-09-16: 300 mg via ORAL

## 2019-09-16 MED ORDER — BUPIVACAINE HCL (PF) 0.25 % IJ SOLN
INTRAMUSCULAR | Status: DC | PRN
  Administered 2019-09-16: 20 mL

## 2019-09-16 MED ORDER — ACETAMINOPHEN 500 MG PO TABS
1000.0000 mg | ORAL_TABLET | ORAL | Status: AC
  Administered 2019-09-16: 1000 mg via ORAL

## 2019-09-16 MED ORDER — PHENYLEPHRINE 40 MCG/ML (10ML) SYRINGE FOR IV PUSH (FOR BLOOD PRESSURE SUPPORT)
PREFILLED_SYRINGE | INTRAVENOUS | Status: DC | PRN
  Administered 2019-09-16: 80 ug via INTRAVENOUS
  Administered 2019-09-16: 40 ug via INTRAVENOUS

## 2019-09-16 MED ORDER — KETOROLAC TROMETHAMINE 30 MG/ML IJ SOLN
INTRAMUSCULAR | Status: DC | PRN
  Administered 2019-09-16: 15 mg via INTRAVENOUS

## 2019-09-16 MED ORDER — PROMETHAZINE HCL 25 MG/ML IJ SOLN: 6.2500 mg | INTRAMUSCULAR | Status: DC | PRN

## 2019-09-16 MED ORDER — GABAPENTIN 300 MG PO CAPS
ORAL_CAPSULE | ORAL | Status: AC
  Filled 2019-09-16: qty 1

## 2019-09-16 MED ORDER — KETOROLAC TROMETHAMINE 30 MG/ML IJ SOLN
INTRAMUSCULAR | Status: AC
  Filled 2019-09-16: qty 1

## 2019-09-16 MED ORDER — PROPOFOL 10 MG/ML IV BOLUS
INTRAVENOUS | Status: AC
  Filled 2019-09-16: qty 20

## 2019-09-16 MED ORDER — CEFAZOLIN SODIUM-DEXTROSE 2-4 GM/100ML-% IV SOLN
INTRAVENOUS | Status: AC
  Filled 2019-09-16: qty 100

## 2019-09-16 MED ORDER — BUPIVACAINE-EPINEPHRINE (PF) 0.5% -1:200000 IJ SOLN
INTRAMUSCULAR | Status: DC | PRN
Start: 2019-09-16 — End: 2019-09-16
  Administered 2019-09-16: 30 mL

## 2019-09-16 MED ORDER — ACETAMINOPHEN 10 MG/ML IV SOLN: 1000.0000 mg | Freq: Once | INTRAVENOUS | Status: DC | PRN

## 2019-09-16 MED ORDER — LIDOCAINE 2% (20 MG/ML) 5 ML SYRINGE
INTRAMUSCULAR | Status: AC
  Filled 2019-09-16: qty 10

## 2019-09-16 MED ORDER — PROPOFOL 10 MG/ML IV BOLUS
INTRAVENOUS | Status: DC | PRN
  Administered 2019-09-16: 200 mg via INTRAVENOUS

## 2019-09-16 SURGICAL SUPPLY — 44 items
APPLIER CLIP 9.375 MED OPEN (MISCELLANEOUS) ×2
BENZOIN TINCTURE PRP APPL 2/3 (GAUZE/BANDAGES/DRESSINGS) ×2 IMPLANT
BLADE HEX COATED 2.75 (ELECTRODE) ×2 IMPLANT
BLADE SURG 15 STRL LF DISP TIS (BLADE) ×1 IMPLANT
BLADE SURG 15 STRL SS (BLADE) ×2
CANISTER SUC SOCK COL 7IN (MISCELLANEOUS) ×2 IMPLANT
CANISTER SUCT 1200ML W/VALVE (MISCELLANEOUS) ×2 IMPLANT
CHLORAPREP W/TINT 26 (MISCELLANEOUS) ×2 IMPLANT
CLIP APPLIE 9.375 MED OPEN (MISCELLANEOUS) ×1 IMPLANT
COVER BACK TABLE 60X90IN (DRAPES) ×2 IMPLANT
COVER MAYO STAND STRL (DRAPES) ×2 IMPLANT
COVER PROBE W GEL 5X96 (DRAPES) ×2 IMPLANT
DRAPE LAPAROSCOPIC ABDOMINAL (DRAPES) ×2 IMPLANT
DRAPE UTILITY XL STRL (DRAPES) ×2 IMPLANT
DRSG TEGADERM 4X4.75 (GAUZE/BANDAGES/DRESSINGS) ×4 IMPLANT
ELECT BLADE 4.0 EZ CLEAN MEGAD (MISCELLANEOUS) ×2
ELECT REM PT RETURN 9FT ADLT (ELECTROSURGICAL) ×2
ELECTRODE BLDE 4.0 EZ CLN MEGD (MISCELLANEOUS) ×1 IMPLANT
ELECTRODE REM PT RTRN 9FT ADLT (ELECTROSURGICAL) ×1 IMPLANT
GAUZE SPONGE 4X4 12PLY STRL LF (GAUZE/BANDAGES/DRESSINGS) ×2 IMPLANT
GLOVE BIO SURGEON STRL SZ7 (GLOVE) ×2 IMPLANT
GLOVE BIOGEL PI IND STRL 7.5 (GLOVE) ×1 IMPLANT
GLOVE BIOGEL PI INDICATOR 7.5 (GLOVE) ×1
GOWN STRL REUS W/ TWL LRG LVL3 (GOWN DISPOSABLE) ×2 IMPLANT
GOWN STRL REUS W/TWL LRG LVL3 (GOWN DISPOSABLE) ×4
KIT MARKER MARGIN INK (KITS) ×2 IMPLANT
NDL SAFETY ECLIPSE 18X1.5 (NEEDLE) ×1 IMPLANT
NEEDLE HYPO 18GX1.5 SHARP (NEEDLE) ×2
NEEDLE HYPO 25X1 1.5 SAFETY (NEEDLE) ×4 IMPLANT
NS IRRIG 1000ML POUR BTL (IV SOLUTION) ×2 IMPLANT
PENCIL SMOKE EVACUATOR (MISCELLANEOUS) ×2 IMPLANT
SET BASIN DAY SURGERY F.S. (CUSTOM PROCEDURE TRAY) ×2 IMPLANT
SLEEVE SCD COMPRESS KNEE MED (MISCELLANEOUS) ×2 IMPLANT
SPONGE LAP 4X18 RFD (DISPOSABLE) ×4 IMPLANT
STRIP CLOSURE SKIN 1/2X4 (GAUZE/BANDAGES/DRESSINGS) ×2 IMPLANT
SUT MON AB 4-0 PC3 18 (SUTURE) ×2 IMPLANT
SUT VIC AB 3-0 SH 27 (SUTURE) ×4
SUT VIC AB 3-0 SH 27X BRD (SUTURE) ×2 IMPLANT
SYR BULB EAR ULCER 3OZ GRN STR (SYRINGE) ×2 IMPLANT
SYR CONTROL 10ML LL (SYRINGE) ×4 IMPLANT
TOWEL GREEN STERILE FF (TOWEL DISPOSABLE) ×2 IMPLANT
TRAY FAXITRON CT DISP (TRAY / TRAY PROCEDURE) ×2 IMPLANT
TUBE CONNECTING 20X1/4 (TUBING) ×2 IMPLANT
YANKAUER SUCT BULB TIP NO VENT (SUCTIONS) ×2 IMPLANT

## 2019-09-16 NOTE — Discharge Instructions (Signed)
[facility] °Office Phone Number [phone] ° °BREAST BIOPSY/ PARTIAL MASTECTOMY: POST OP INSTRUCTIONS ° °Always review your discharge instruction sheet given to you by the facility where your surgery was performed. ° °IF YOU HAVE DISABILITY OR FAMILY LEAVE FORMS, YOU MUST BRING THEM TO THE OFFICE FOR PROCESSING.  DO NOT GIVE THEM TO YOUR DOCTOR. ° °1. A prescription for pain medication may be given to you upon discharge.  Take your pain medication as prescribed, if needed.  If narcotic pain medicine is not needed, then you may take acetaminophen (Tylenol) or ibuprofen (Advil) as needed. °2. Take your usually prescribed medications unless otherwise directed °3. If you need a refill on your pain medication, please contact your pharmacy.  They will contact our office to request authorization.  Prescriptions will not be filled after 5pm or on week-ends. °4. You should eat very light the first 24 hours after surgery, such as soup, crackers, pudding, etc.  Resume your normal diet the day after surgery. °5. Most patients will experience some swelling and bruising in the breast.  Ice packs and a good support bra will help.  Swelling and bruising can take several days to resolve.  °6. It is common to experience some constipation if taking pain medication after surgery.  Increasing fluid intake and taking a stool softener will usually help or prevent this problem from occurring.  A mild laxative (Milk of Magnesia or Miralax) should be taken according to package directions if there are no bowel movements after 48 hours. °7. Unless discharge instructions indicate otherwise, you may remove your bandages 24-48 hours after surgery, and you may shower at that time.  You may have steri-strips (small skin tapes) in place directly over the incision.  These strips should be left on the skin for 7-10 days.  If your surgeon used skin glue on the incision, you may shower in 24 hours.  The glue will flake off over the  next 2-3 weeks.  Any sutures or staples will be removed at the office during your follow-up visit. °8. ACTIVITIES:  You may resume regular daily activities (gradually increasing) beginning the next day.  Wearing a good support bra or sports bra minimizes pain and swelling.  You may have sexual intercourse when it is comfortable. °a. You may drive when you no longer are taking prescription pain medication, you can comfortably wear a seatbelt, and you can safely maneuver your car and apply brakes. °b. RETURN TO WORK:  ______________________________________________________________________________________ °9. You should see your doctor in the office for a follow-up appointment approximately two weeks after your surgery.  Your doctor’s nurse will typically make your follow-up appointment when she calls you with your pathology report.  Expect your pathology report 2-3 business days after your surgery.  You may call to check if you do not hear from us after three days. °10. OTHER INSTRUCTIONS: _______________________________________________________________________________________________ _____________________________________________________________________________________________________________________________________ °_____________________________________________________________________________________________________________________________________ °_____________________________________________________________________________________________________________________________________ ° °WHEN TO CALL YOUR DOCTOR: °1. Fever over 101.0 °2. Nausea and/or vomiting. °3. Extreme swelling or bruising. °4. Continued bleeding from incision. °5. Increased pain, redness, or drainage from the incision. ° °The clinic staff is available to answer your questions during regular business hours.  Please don’t hesitate to call and ask to speak to one of the nurses for clinical concerns.  If you have a medical emergency, go to the nearest  emergency room or call 911.  A surgeon from [facility] is always on call at the hospital. ° °For further questions, please visit [email or web address]  ° ° ° ° °  Post Anesthesia Home Care Instructions ° °Activity: °Get plenty of rest for the remainder of the day. A responsible individual must stay with you for 24 hours following the procedure.  °For the next 24 hours, DO NOT: °-Drive a car °-Operate machinery °-Drink alcoholic beverages °-Take any medication unless instructed by your physician °-Make any legal decisions or sign important papers. ° °Meals: °Start with liquid foods such as gelatin or soup. Progress to regular foods as tolerated. Avoid greasy, spicy, heavy foods. If nausea and/or vomiting occur, drink only clear liquids until the nausea and/or vomiting subsides. Call your physician if vomiting continues. ° °Special Instructions/Symptoms: °Your throat may feel dry or sore from the anesthesia or the breathing tube placed in your throat during surgery. If this causes discomfort, gargle with warm salt water. The discomfort should disappear within 24 hours. ° °If you had a scopolamine patch placed behind your ear for the management of post- operative nausea and/or vomiting: ° °1. The medication in the patch is effective for 72 hours, after which it should be removed.  Wrap patch in a tissue and discard in the trash. Wash hands thoroughly with soap and water. °2. You may remove the patch earlier than 72 hours if you experience unpleasant side effects which may include dry mouth, dizziness or visual disturbances. °3. Avoid touching the patch. Wash your hands with soap and water after contact with the patch. °  ° °

## 2019-09-16 NOTE — Anesthesia Preprocedure Evaluation (Addendum)
Anesthesia Evaluation  Patient identified by MRN, date of birth, ID band Patient awake    Reviewed: Allergy & Precautions, NPO status , Patient's Chart, lab work & pertinent test results  Airway Mallampati: II  TM Distance: >3 FB Neck ROM: Full    Dental  (+) Teeth Intact, Dental Advisory Given   Pulmonary former smoker,    breath sounds clear to auscultation       Cardiovascular hypertension,  Rhythm:Regular Rate:Normal     Neuro/Psych negative neurological ROS  negative psych ROS   GI/Hepatic Neg liver ROS, GERD  ,  Endo/Other  negative endocrine ROS  Renal/GU negative Renal ROS     Musculoskeletal  (+) Arthritis ,   Abdominal (+) + obese,   Peds  Hematology negative hematology ROS (+)   Anesthesia Other Findings   Reproductive/Obstetrics                            Anesthesia Physical Anesthesia Plan  ASA: II  Anesthesia Plan: General   Post-op Pain Management: GA combined w/ Regional for post-op pain   Induction: Intravenous  PONV Risk Score and Plan: 4 or greater and Ondansetron, Dexamethasone and Treatment may vary due to age or medical condition  Airway Management Planned: LMA  Additional Equipment: None  Intra-op Plan:   Post-operative Plan: Extubation in OR  Informed Consent: I have reviewed the patients History and Physical, chart, labs and discussed the procedure including the risks, benefits and alternatives for the proposed anesthesia with the patient or authorized representative who has indicated his/her understanding and acceptance.     Dental advisory given  Plan Discussed with: CRNA  Anesthesia Plan Comments:        Anesthesia Quick Evaluation

## 2019-09-16 NOTE — Transfer of Care (Signed)
Immediate Anesthesia Transfer of Care Note  Patient: Kim Fuller  Procedure(s) Performed: LEFT BREAST LUMPECTOMY WITH RADIOACTIVE SEED AND SENTINEL LYMPH NODE BIOPSY (Left Breast)  Patient Location: PACU  Anesthesia Type:General and Regional  Level of Consciousness: drowsy  Airway & Oxygen Therapy: Patient Spontanous Breathing and Patient connected to face mask oxygen  Post-op Assessment: Report given to RN and Post -op Vital signs reviewed and stable  Post vital signs: Reviewed and stable  Last Vitals:  Vitals Value Taken Time  BP 122/60 09/16/19 1306  Temp    Pulse 78 09/16/19 1308  Resp 13 09/16/19 1308  SpO2 97 % 09/16/19 1308  Vitals shown include unvalidated device data.  Last Pain:  Vitals:   09/16/19 1035  TempSrc:   PainSc: 0-No pain         Complications: No apparent anesthesia complications

## 2019-09-16 NOTE — Interval H&P Note (Signed)
History and Physical Interval Note:  [id number] AM  Kim Fuller  has presented today for surgery, with the diagnosis of LEFT BREAST INVASIVE DUCTAL CARCINOMA.  The various methods of treatment have been discussed with the patient and family. After consideration of risks, benefits and other options for treatment, the patient has consented to  Procedure(s) with comments: LEFT BREAST LUMPECTOMY WITH RADIOACTIVE SEED AND SENTINEL LYMPH NODE BIOPSY (Left) - COMBINED WITH REGIONAL FOR POST OP PAIN as a surgical intervention.  The patient's history has been reviewed, patient examined, no change in status, stable for surgery.  I have reviewed the patient's chart and labs.  Questions were answered to the patient's satisfaction.     [name]

## 2019-09-16 NOTE — Progress Notes (Signed)
Assisted Dr. Hollis with left, ultrasound guided, pectoralis block. Side rails up, monitors on throughout procedure. See vital signs in flow sheet. Tolerated Procedure well. 

## 2019-09-16 NOTE — Anesthesia Procedure Notes (Signed)
Procedure Name: LMA Insertion Date/Time: 09/16/2019 11:27 AM Performed by: Gwyndolyn Saxon, CRNA Pre-anesthesia Checklist: Patient identified, Emergency Drugs available, Suction available and Patient being monitored Patient Re-evaluated:Patient Re-evaluated prior to induction Oxygen Delivery Method: Circle system utilized Preoxygenation: Pre-oxygenation with 100% oxygen Induction Type: IV induction Ventilation: Mask ventilation without difficulty LMA: LMA inserted LMA Size: 4.0 Number of attempts: 1 Placement Confirmation: positive ETCO2 and breath sounds checked- equal and bilateral Tube secured with: Tape Dental Injury: Teeth and Oropharynx as per pre-operative assessment

## 2019-09-16 NOTE — Op Note (Signed)
Pre-op Diagnosis: Left invasive ductal carcinoma Post-op Diagnosis: same Procedure: Left radioactive seed localized lumpectomy, blue dye injection, sentinel lymph node biopsy Surgeon:  Raphael Fitzpatrick K. Anesthesia:  GEN - LMA Indications:   This is a 74 year old female in reasonably good health who presents after recent routine screening mammogram. This showed some left asymmetry. Further work-up revealed a 0.5 cm invasive ductal carcinoma with some associated DCIS, triple negative, Ki-67 50%. The tumor is located at 0200 on the left 11 cmfn.  She presents now for lumpectomy and sentinel lymph node biopsy.  Radioactive seed was placed yesterday by radiology.  She was injected with technetium sulfur colloid in the preoperative area for the sentinel lymph node biopsy.   Description of procedure: The patient is brought to the operating room placed in supine position on the operating room table. After an adequate level of general anesthesia was obtained, her left breast and axilla was prepped with ChloraPrep and draped in sterile fashion. A timeout was taken to ensure the proper patient and proper procedure. We interrogated the breast with the neoprobe. We made a circumareolar incision around the lower side of the nipple after infiltrating with 0.25% Marcaine. Dissection was carried down in the breast tissue with cautery. We used the neoprobe to guide [place] towards the radioactive seed. We excised an area of tissue around the radioactive seed 2 centimeters in diameter. The specimen was removed and was oriented with a paint kit. Specimen mammogram showed the radioactive seed as well as the biopsy clip within the specimen. This was sent for pathologic examination. There is no residual radioactivity within the biopsy cavity. We inspected carefully for hemostasis.  Marking clips were placed to mark the margins of the biopsy cavity.   We then turned our attention to the axilla.  I interrogated the axilla with the  neoprobe.  We made a transverse incision.  I dissected through the subcutaneous tissues with cautery.  Went to the axillary contents.  We identified a total of 3 hot lymph nodes.  The sentinel lymph node #1 contain some blue dye.  The other lymph nodes did not contain any visible blue dye.  These were sent separately for pathologic examination.  The wound was thoroughly irrigated.  Both wounds were closed with a deep layer of 3-0 Vicryl and a subcuticular layer of 4-0 Monocryl. Benzoin Steri-Strips were applied. The patient was then extubated and brought to the recovery room in stable condition. All sponge, instrument, and needle counts are correct.  Imogene Burn. Georgette Dover, MD, [facility]  General/ Trauma Surgery  09/16/2019 12:59 PM

## 2019-09-16 NOTE — Progress Notes (Signed)
Time out was performed with with nuclear medicine.  Waunita Schooner [phone]).  Left breast.

## 2019-09-16 NOTE — Anesthesia Postprocedure Evaluation (Signed)
Anesthesia Post Note  Patient: Kim Fuller  Procedure(s) Performed: LEFT BREAST LUMPECTOMY WITH RADIOACTIVE SEED AND SENTINEL LYMPH NODE BIOPSY (Left Breast)     Patient location during evaluation: PACU Anesthesia Type: General Level of consciousness: awake and alert Pain management: pain level controlled Vital Signs Assessment: post-procedure vital signs reviewed and stable Respiratory status: spontaneous breathing, nonlabored ventilation, respiratory function stable and patient connected to nasal cannula oxygen Cardiovascular status: blood pressure returned to baseline and stable Postop Assessment: no apparent nausea or vomiting Anesthetic complications: no    Last Vitals:  Vitals:   09/16/19 1415 09/16/19 1447  BP: 133/67 135/63  Pulse: 63 79  Resp: (!) 9 12  Temp:  36.8 C  SpO2: 99% 95%    Last Pain:  Vitals:   09/16/19 1447  TempSrc:   PainSc: [place] Jahmad Petrich

## 2019-09-16 NOTE — Anesthesia Procedure Notes (Signed)
Anesthesia Regional Block: Pectoralis block   Pre-Anesthetic Checklist: ,, timeout performed, Correct Patient, Correct Site, Correct Laterality, Correct Procedure, Correct Position, site marked, Risks and benefits discussed,  Surgical consent,  Pre-op evaluation,  At surgeon's request and post-op pain management  Laterality: Left  Prep: chloraprep       Needles:  Injection technique: Single-shot  Needle Type: Echogenic Stimulator Needle     Needle Length: 9cm  Needle Gauge: 21     Additional Needles:   Procedures:,,,, ultrasound used (permanent image in chart),,,,  Narrative:  Start time: 09/16/2019 10:30 AM End time: 09/16/2019 10:35 AM Injection made incrementally with aspirations every 5 mL.  Performed by: Personally  Anesthesiologist: Effie Berkshire, MD  Additional Notes: Patient tolerated the procedure well. Local anesthetic introduced in an incremental fashion under minimal resistance after negative aspirations. No paresthesias were elicited. After completion of the procedure, no acute issues were identified and patient continued to be monitored by RN.

## 2019-09-17 ENCOUNTER — Encounter: Payer: Self-pay | Admitting: *Deleted

## 2019-09-17 ENCOUNTER — Telehealth: Payer: Self-pay | Admitting: Hematology and Oncology

## 2019-09-17 LAB — SURGICAL PATHOLOGY

## 2019-09-17 NOTE — Telephone Encounter (Signed)
Scheduled appt per 5/13 sch message =-pt is aware of appt date and time

## 2019-09-21 ENCOUNTER — Encounter: Payer: Self-pay | Admitting: *Deleted

## 2019-09-23 ENCOUNTER — Telehealth: Payer: Self-pay

## 2019-09-23 NOTE — Telephone Encounter (Signed)
Nutrition Assessment  Reason for Assessment:  Pt attended Breast Clinic and was given nutrition packet by nurse navigator.   ASSESSMENT:  74 year old female with new diagnosis of breast cancer.  S/p lumpectomy.  Possible chemotherapy and radiation planned.    Patient returned RD's call.  Spoke with patient and introduced self and service at [facility].  Patient reports normal appetite.  Has been trying to include more fruits and vegetables in diet to help her loose weight.    Medications:  MVI, Vit D3, VIt E, calcium  Labs: reviewed  Anthropometrics:   Height: 62 inches Weight:  214 lb 15.2 oz BMI: 39   NUTRITION DIAGNOSIS: Food and nutrition related knowledge deficit related to new diagnosis of breast cancer as evidenced by no prior need for nutrition related information.  INTERVENTION:   Discussed briefly packet of information regarding nutritional tips for breast cancer patients.  Questions answered.  Teachback method used.  Contact information provided and patient knows to contact me with questions/concerns.    MONITORING, EVALUATION, and GOAL: Pt will consume a healthy plant based diet to maintain lean body mass throughout treatment.   Myson Levi B. Zenia Resides, [place], [place] Registered Dietitian [phone] (pager)

## 2019-09-23 NOTE — Progress Notes (Signed)
Patient Care Team: Hoyt Koch, MD as PCP - General (Internal Medicine) Mauro Kaufmann, RN as Oncology Nurse Navigator Rockwell Germany, RN as Oncology Nurse Navigator Donnie Mesa, MD as Consulting Physician (General Surgery) Nicholas Lose, MD as Consulting Physician (Hematology and Oncology) Kyung Rudd, MD as Consulting Physician (Radiation Oncology)  DIAGNOSIS:    ICD-10-CM   1. Malignant neoplasm of upper-outer quadrant of left breast in female, estrogen receptor negative ([place])  C50.412    Z17.1     SUMMARY OF ONCOLOGIC HISTORY: Oncology History  Malignant neoplasm of upper-outer quadrant of left breast in female, estrogen receptor negative ([place])  09/08/2019 Initial Diagnosis   Screening mammogram detected a left breast asymmetry. Diagnostic mammogram and US showed a 0.5cm mass, 2 o'clock position, with no axillary adenopathy. Biopsy showed IDC, grade 3, HER-2 - (0), ER/PR -, Ki67 50%.   09/09/2019 Cancer Staging   Staging form: Breast, AJCC 8th Edition - Clinical stage from 09/09/2019: Stage IB (cT1a, cN0, cM0, G3, ER-, PR-, HER2-) - Signed by Nicholas Lose, MD on 09/09/2019   09/16/2019 Surgery   Left lumpectomy (Tsuei): IDC, grade 3, 0.9cm, with high grade DCIS, 4 left axillary lymph nodes negative.     CHIEF COMPLIANT: Follow-up s/p lumpectomy to review pathology  INTERVAL HISTORY: Kim Fuller is a 74 y.o. with above-mentioned history of triple negative left breast cancer. She underwent a left lumpectomy with Dr. Georgette Dover on 09/16/19 for which pathology showed invasive ductal carcinoma, grade 3, 0.9cm, with high grade DCIS, 4 left axillary lymph nodes negative for carcinoma. She presents to the clinic today to review the pathology report and discuss further treatment.   ALLERGIES:  is allergic to sulfonamide derivatives; tramadol hcl; and tramadol hcl.  MEDICATIONS:  Current Outpatient Medications  Medication Sig Dispense Refill  . aspirin 81 MG chewable  tablet Chew 81 mg by mouth daily.    . benazepril (LOTENSIN) 40 MG tablet Take 0.5 tablets (20 mg total) by mouth daily. 45 tablet 3  . bisoprolol-hydrochlorothiazide (ZIAC) 5-6.25 MG tablet Take 1 tablet by mouth daily. 90 tablet 3  . calcium carbonate (OSCAL) 1500 (600 Ca) MG TABS tablet Take 600 mg of elemental calcium by mouth 2 (two) times daily with a meal.    . cholecalciferol (VITAMIN D3) 10 MCG (400 UNIT) TABS tablet Take 400 Units by mouth.    . esomeprazole (NEXIUM) 10 MG packet Take 10 mg by mouth daily before breakfast.    . fluticasone (FLONASE) 50 MCG/ACT nasal spray USE TWO SPRAY(S) IN EACH NOSTRIL DAILY 48 g 6  . HYDROcodone-acetaminophen (NORCO/VICODIN) 5-325 MG tablet Take 1 tablet by mouth every 6 (six) hours as needed for moderate pain. 15 tablet 0  . ibuprofen (ADVIL) 200 MG tablet Take 200 mg by mouth every 6 (six) hours as needed.    . Multiple Vitamins-Minerals (CENTRUM SILVER ULTRA WOMENS PO) Take 1 tablet by mouth daily.    . Omega-3 Fatty Acids (FISH OIL) 1000 MG CAPS Take 1,000 mg by mouth 2 (two) times daily.    . pravastatin (PRAVACHOL) 20 MG tablet Take 1 tablet (20 mg total) by mouth daily. 90 tablet 3  . Propylene Glycol (SYSTANE BALANCE) 0.6 % SOLN Place 1 drop 3 (three) times daily as needed into both eyes (dry eyes).    . tizanidine (ZANAFLEX) 2 MG capsule Take 1 capsule (2 mg total) by mouth 2 (two) times daily as needed for muscle spasms. 30 capsule 0  . vitamin E 400  UNIT capsule Take 400 Units by mouth daily.     No current facility-administered medications for this visit.    PHYSICAL EXAMINATION: ECOG PERFORMANCE STATUS: 1 - Symptomatic but completely ambulatory  Vitals:   09/24/19 1455  BP: (!) 123/59  Pulse: (!) 54  Resp: 18  Temp: 98.7 F (37.1 C)  SpO2: 93%   Filed Weights   09/24/19 1455  Weight: 216 lb 1.6 oz (98 kg)    LABORATORY DATA:  I have reviewed the data as listed CMP Latest Ref Rng & Units 09/15/2019 09/09/2019 04/24/2019    Glucose 70 - 99 mg/dL 99 89 93  BUN 8 - 23 mg/dL _0 Creatinine 0.44 - 1.00 mg/dL 0.79 0.79 0.78  Sodium 135 - 145 mmol/L 141 144 139  Potassium 3.5 - 5.1 mmol/L 5.3(H) 3.8 4.4  Chloride 98 - 111 mmol/L 104 102 102  CO2 22 - 32 mmol/L 29 33(H) 32  Calcium 8.9 - 10.3 mg/dL 9.3 10.4(H) 10.0  Total Protein 6.5 - 8.1 g/dL - 7.5 7.4  Total Bilirubin 0.3 - 1.2 mg/dL - 0.6 0.6  Alkaline Phos 38 - 126 U/L - 53 56  AST 15 - 41 U/L - 18 22  ALT 0 - 44 U/L - 17 23    Lab Results  Component Value Date   WBC 7.1 09/09/2019   HGB 13.9 09/09/2019   HCT 42.8 09/09/2019   MCV 100.7 (H) 09/09/2019   PLT 169 09/09/2019   NEUTROABS 3.9 09/09/2019    ASSESSMENT & PLAN:  Malignant neoplasm of upper-outer quadrant of left breast in female, estrogen receptor negative ([place]) 09/16/2018: Left lumpectomy (Tsuei): IDC, grade 3, 0.9cm, with high grade DCIS, 4 left axillary lymph nodes negative.  Triple negative with a Ki-67 of 50%  Pathology counseling: I discussed the final pathology report of the patient provided  a copy of this report. I discussed the margins as well as lymph node surgeries. We also discussed the final staging along with previously performed ER/PR and HER-2/neu testing.  Treatment plan: 1.  Adjuvant chemotherapy with CMF 2.  Adjuvant radiation therapy  Chemo counseling: I discussed with her that CMF is much more tolerable regimen given her age and her performance status.  Return to clinic in 3 weeks to start chemotherapy. I will request port placement.    No orders of the defined types were placed in this encounter.  The patient has a good understanding of the overall plan. she agrees with it. she will call with any problems that may develop before the next visit here.  Total time spent: 40 mins including face to face time and time spent for planning, charting and coordination of care  Nicholas Lose, MD 09/24/2019  I, Cloyde Reams Dorshimer, am acting as scribe for Dr. Nicholas Lose.  I have reviewed the above documentation for accuracy and completeness, and I agree with the above.

## 2019-09-24 ENCOUNTER — Other Ambulatory Visit: Payer: Self-pay

## 2019-09-24 ENCOUNTER — Inpatient Hospital Stay (HOSPITAL_BASED_OUTPATIENT_CLINIC_OR_DEPARTMENT_OTHER): Payer: Medicare Other | Admitting: Hematology and Oncology

## 2019-09-24 ENCOUNTER — Encounter: Payer: Self-pay | Admitting: *Deleted

## 2019-09-24 VITALS — BP 123/59 | HR 54 | Temp 98.7°F | Resp 18 | Ht 62.0 in | Wt 216.1 lb

## 2019-09-24 DIAGNOSIS — Z171 Estrogen receptor negative status [ER-]: Secondary | ICD-10-CM | POA: Diagnosis not present

## 2019-09-24 DIAGNOSIS — E785 Hyperlipidemia, unspecified: Secondary | ICD-10-CM | POA: Diagnosis not present

## 2019-09-24 DIAGNOSIS — C50412 Malignant neoplasm of upper-outer quadrant of left female breast: Secondary | ICD-10-CM

## 2019-09-24 DIAGNOSIS — K219 Gastro-esophageal reflux disease without esophagitis: Secondary | ICD-10-CM | POA: Diagnosis not present

## 2019-09-24 DIAGNOSIS — I10 Essential (primary) hypertension: Secondary | ICD-10-CM | POA: Diagnosis not present

## 2019-09-24 DIAGNOSIS — M8580 Other specified disorders of bone density and structure, unspecified site: Secondary | ICD-10-CM | POA: Diagnosis not present

## 2019-09-24 MED ORDER — ONDANSETRON HCL 8 MG PO TABS: 8.0000 mg | ORAL_TABLET | Freq: Two times a day (BID) | ORAL | 1 refills | Status: DC | PRN

## 2019-09-24 MED ORDER — PROCHLORPERAZINE MALEATE 10 MG PO TABS: 10.0000 mg | ORAL_TABLET | Freq: Four times a day (QID) | ORAL | 1 refills | Status: DC | PRN

## 2019-09-24 MED ORDER — LIDOCAINE-PRILOCAINE 2.5-2.5 % EX CREA: TOPICAL_CREAM | CUTANEOUS | 3 refills | Status: DC

## 2019-09-24 NOTE — Assessment & Plan Note (Signed)
09/16/2018: Left lumpectomy (Tsuei): IDC, grade 3, 0.9cm, with high grade DCIS, 4 left axillary lymph nodes negative.  Triple negative with a Ki-67 of 50%  Pathology counseling: I discussed the final pathology report of the patient provided  a copy of this report. I discussed the margins as well as lymph node surgeries. We also discussed the final staging along with previously performed ER/PR and HER-2/neu testing.  Treatment plan: 1.  Adjuvant chemotherapy with CMF 2.  Adjuvant radiation therapy  Chemo counseling: I discussed with her that CMF is much more tolerable regimen given her age and her performance status.  Return to clinic in 3 weeks to start chemotherapy. I will request port placement.

## 2019-09-24 NOTE — Progress Notes (Signed)
START OFF PATHWAY REGIMEN - Breast   [id number]:CMF (IV cyclophosphamide) q21 days:   A cycle is every 21 days:     Cyclophosphamide      Methotrexate      Fluorouracil   **Always confirm dose/schedule in your pharmacy ordering system**  Patient Characteristics: Postoperative without Neoadjuvant Therapy (Pathologic Staging), Invasive Disease, Adjuvant Therapy, HER2 Negative/Unknown/Equivocal, ER Negative/Unknown, Node Negative, pT1a-b, N0, Chemotherapy Indicated Therapeutic Status: Postoperative without Neoadjuvant Therapy (Pathologic Staging) AJCC Grade: G3 AJCC N Category: pN0 AJCC M Category: cM0 ER Status: Negative (-) AJCC 8 Stage Grouping: IB HER2 Status: Negative (-) Oncotype Dx Recurrence Score: Not Appropriate AJCC T Category: pT1b PR Status: Negative (-) Intervention Indicated: Chemotherapy Intent of Therapy: Curative Intent, Discussed with Patient

## 2019-09-25 ENCOUNTER — Ambulatory Visit: Payer: Self-pay | Admitting: Surgery

## 2019-09-25 ENCOUNTER — Telehealth: Payer: Self-pay | Admitting: Hematology and Oncology

## 2019-09-25 NOTE — Telephone Encounter (Signed)
Scheduled per 5/20 sch messgae. Pt aware of appts on 5/27.

## 2019-09-26 ENCOUNTER — Other Ambulatory Visit (HOSPITAL_COMMUNITY)
Admission: RE | Admit: 2019-09-26 | Discharge: 2019-09-26 | Disposition: A | Discharge status: Home / Self Care | Payer: Medicare Other | Source: Ambulatory Visit | Attending: Surgery | Admitting: Surgery

## 2019-09-26 DIAGNOSIS — Z20822 Contact with and (suspected) exposure to covid-19: Secondary | ICD-10-CM | POA: Insufficient documentation

## 2019-09-26 DIAGNOSIS — Z01812 Encounter for preprocedural laboratory examination: Secondary | ICD-10-CM | POA: Diagnosis not present

## 2019-09-26 LAB — SARS CORONAVIRUS 2 (TAT 6-24 HRS): SARS Coronavirus 2: NEGATIVE

## 2019-09-28 ENCOUNTER — Encounter (HOSPITAL_COMMUNITY): Payer: Self-pay | Admitting: Surgery

## 2019-09-28 NOTE — Anesthesia Preprocedure Evaluation (Addendum)
Anesthesia Evaluation  Patient identified by MRN, date of birth, ID band Patient awake    Reviewed: Allergy & Precautions, NPO status , Patient's Chart, lab work & pertinent test results, reviewed documented beta blocker date and time   History of Anesthesia Complications Negative for: history of anesthetic complications  Airway Mallampati: III  TM Distance: >3 FB Neck ROM: Full    Dental  (+) Teeth Intact, Dental Advisory Given   Pulmonary neg pulmonary ROS, former smoker,    Pulmonary exam normal        Cardiovascular hypertension, Pt. on medications and Pt. on home beta blockers Normal cardiovascular exam     Neuro/Psych negative neurological ROS  negative psych ROS   GI/Hepatic Neg liver ROS, GERD  ,  Endo/Other  Morbid obesity  Renal/GU negative Renal ROS  negative genitourinary   Musculoskeletal  (+) Arthritis ,   Abdominal   Peds  Hematology negative hematology ROS (+)   Anesthesia Other Findings  Breast cancer  Reproductive/Obstetrics                           Anesthesia Physical Anesthesia Plan  ASA: III  Anesthesia Plan: General   Post-op Pain Management:    Induction: Intravenous  PONV Risk Score and Plan: 3 and Ondansetron, Dexamethasone, Midazolam and Treatment may vary due to age or medical condition  Airway Management Planned: LMA  Additional Equipment: None  Intra-op Plan:   Post-operative Plan: Extubation in OR  Informed Consent: I have reviewed the patients History and Physical, chart, labs and discussed the procedure including the risks, benefits and alternatives for the proposed anesthesia with the patient or authorized representative who has indicated his/her understanding and acceptance.     Dental advisory given  Plan Discussed with: CRNA, Anesthesiologist and Surgeon  Anesthesia Plan Comments:        Anesthesia Quick Evaluation

## 2019-09-29 ENCOUNTER — Ambulatory Visit (HOSPITAL_COMMUNITY): Payer: Medicare Other | Admitting: Anesthesiology

## 2019-09-29 ENCOUNTER — Encounter (HOSPITAL_COMMUNITY)
Admission: RE | Disposition: A | Discharge status: Home / Self Care | Payer: Self-pay | Source: Home / Self Care | Attending: Surgery

## 2019-09-29 ENCOUNTER — Other Ambulatory Visit: Payer: Self-pay

## 2019-09-29 ENCOUNTER — Encounter (HOSPITAL_COMMUNITY): Payer: Self-pay | Admitting: Surgery

## 2019-09-29 ENCOUNTER — Ambulatory Visit (HOSPITAL_COMMUNITY): Payer: Medicare Other

## 2019-09-29 ENCOUNTER — Encounter: Payer: Self-pay | Admitting: *Deleted

## 2019-09-29 ENCOUNTER — Ambulatory Visit (HOSPITAL_COMMUNITY)
Admission: RE | Admit: 2019-09-29 | Discharge: 2019-09-29 | Disposition: A | Discharge status: Home / Self Care | Payer: Medicare Other | Attending: Surgery | Admitting: Surgery

## 2019-09-29 DIAGNOSIS — Z452 Encounter for adjustment and management of vascular access device: Secondary | ICD-10-CM

## 2019-09-29 DIAGNOSIS — Z79899 Other long term (current) drug therapy: Secondary | ICD-10-CM | POA: Insufficient documentation

## 2019-09-29 DIAGNOSIS — Z8249 Family history of ischemic heart disease and other diseases of the circulatory system: Secondary | ICD-10-CM | POA: Insufficient documentation

## 2019-09-29 DIAGNOSIS — Z9841 Cataract extraction status, right eye: Secondary | ICD-10-CM | POA: Insufficient documentation

## 2019-09-29 DIAGNOSIS — M1990 Unspecified osteoarthritis, unspecified site: Secondary | ICD-10-CM | POA: Diagnosis not present

## 2019-09-29 DIAGNOSIS — Z86010 Personal history of colonic polyps: Secondary | ICD-10-CM | POA: Diagnosis not present

## 2019-09-29 DIAGNOSIS — Z882 Allergy status to sulfonamides status: Secondary | ICD-10-CM | POA: Diagnosis not present

## 2019-09-29 DIAGNOSIS — Z87891 Personal history of nicotine dependence: Secondary | ICD-10-CM | POA: Diagnosis not present

## 2019-09-29 DIAGNOSIS — C50412 Malignant neoplasm of upper-outer quadrant of left female breast: Secondary | ICD-10-CM | POA: Insufficient documentation

## 2019-09-29 DIAGNOSIS — Z888 Allergy status to other drugs, medicaments and biological substances status: Secondary | ICD-10-CM | POA: Diagnosis not present

## 2019-09-29 DIAGNOSIS — Z7982 Long term (current) use of aspirin: Secondary | ICD-10-CM | POA: Diagnosis not present

## 2019-09-29 DIAGNOSIS — Z808 Family history of malignant neoplasm of other organs or systems: Secondary | ICD-10-CM | POA: Insufficient documentation

## 2019-09-29 DIAGNOSIS — E7800 Pure hypercholesterolemia, unspecified: Secondary | ICD-10-CM | POA: Diagnosis not present

## 2019-09-29 DIAGNOSIS — K5790 Diverticulosis of intestine, part unspecified, without perforation or abscess without bleeding: Secondary | ICD-10-CM | POA: Diagnosis not present

## 2019-09-29 DIAGNOSIS — Z6839 Body mass index (BMI) 39.0-39.9, adult: Secondary | ICD-10-CM | POA: Diagnosis not present

## 2019-09-29 DIAGNOSIS — Z8261 Family history of arthritis: Secondary | ICD-10-CM | POA: Diagnosis not present

## 2019-09-29 DIAGNOSIS — I10 Essential (primary) hypertension: Secondary | ICD-10-CM | POA: Diagnosis not present

## 2019-09-29 DIAGNOSIS — Z8379 Family history of other diseases of the digestive system: Secondary | ICD-10-CM | POA: Diagnosis not present

## 2019-09-29 DIAGNOSIS — C50912 Malignant neoplasm of unspecified site of left female breast: Secondary | ICD-10-CM | POA: Diagnosis not present

## 2019-09-29 DIAGNOSIS — Z171 Estrogen receptor negative status [ER-]: Secondary | ICD-10-CM | POA: Insufficient documentation

## 2019-09-29 DIAGNOSIS — Z836 Family history of other diseases of the respiratory system: Secondary | ICD-10-CM | POA: Insufficient documentation

## 2019-09-29 DIAGNOSIS — Z9842 Cataract extraction status, left eye: Secondary | ICD-10-CM | POA: Diagnosis not present

## 2019-09-29 DIAGNOSIS — K219 Gastro-esophageal reflux disease without esophagitis: Secondary | ICD-10-CM | POA: Diagnosis not present

## 2019-09-29 DIAGNOSIS — Z95828 Presence of other vascular implants and grafts: Secondary | ICD-10-CM

## 2019-09-29 HISTORY — PX: PORTACATH PLACEMENT: SHX2246

## 2019-09-29 LAB — CBC
HCT: 40.5 % (ref 36.0–46.0)
Hemoglobin: 13.1 g/dL (ref 12.0–15.0)
MCH: 33 pg (ref 26.0–34.0)
MCHC: 32.3 g/dL (ref 30.0–36.0)
MCV: 102 fL — ABNORMAL HIGH (ref 80.0–100.0)
Platelets: 200 10*3/uL (ref 150–400)
RBC: 3.97 MIL/uL (ref 3.87–5.11)
RDW: 12.5 % (ref 11.5–15.5)
WBC: 6.4 10*3/uL (ref 4.0–10.5)
nRBC: 0 % (ref 0.0–0.2)

## 2019-09-29 SURGERY — INSERTION, TUNNELED CENTRAL VENOUS DEVICE, WITH PORT
Anesthesia: General | Site: Chest

## 2019-09-29 MED ORDER — ONDANSETRON HCL 4 MG/2ML IJ SOLN
INTRAMUSCULAR | Status: DC | PRN
  Administered 2019-09-29: 4 mg via INTRAVENOUS

## 2019-09-29 MED ORDER — LACTATED RINGERS IV SOLN: INTRAVENOUS | Status: DC | PRN

## 2019-09-29 MED ORDER — ONDANSETRON HCL 4 MG/2ML IJ SOLN: 4.0000 mg | Freq: Once | INTRAMUSCULAR | Status: DC | PRN

## 2019-09-29 MED ORDER — CEFAZOLIN SODIUM-DEXTROSE 2-4 GM/100ML-% IV SOLN
2.0000 g | INTRAVENOUS | Status: AC
  Administered 2019-09-29: 2 g via INTRAVENOUS

## 2019-09-29 MED ORDER — LIDOCAINE 2% (20 MG/ML) 5 ML SYRINGE
INTRAMUSCULAR | Status: DC | PRN
  Administered 2019-09-29: 100 mg via INTRAVENOUS

## 2019-09-29 MED ORDER — HEPARIN SOD (PORK) LOCK FLUSH 100 UNIT/ML IV SOLN
INTRAVENOUS | Status: AC
  Filled 2019-09-29: qty 5

## 2019-09-29 MED ORDER — PROPOFOL 10 MG/ML IV BOLUS
INTRAVENOUS | Status: AC
  Filled 2019-09-29: qty 20

## 2019-09-29 MED ORDER — OXYCODONE HCL 5 MG PO TABS: 5.0000 mg | ORAL_TABLET | Freq: Once | ORAL | Status: DC | PRN

## 2019-09-29 MED ORDER — MIDAZOLAM HCL 2 MG/2ML IJ SOLN
INTRAMUSCULAR | Status: AC
  Filled 2019-09-29: qty 2

## 2019-09-29 MED ORDER — CEFAZOLIN SODIUM-DEXTROSE 2-4 GM/100ML-% IV SOLN
INTRAVENOUS | Status: AC
  Filled 2019-09-29: qty 100

## 2019-09-29 MED ORDER — DEXAMETHASONE SODIUM PHOSPHATE 10 MG/ML IJ SOLN
INTRAMUSCULAR | Status: DC | PRN
  Administered 2019-09-29: 4 mg via INTRAVENOUS

## 2019-09-29 MED ORDER — CHLORHEXIDINE GLUCONATE 0.12 % MT SOLN
15.0000 mL | Freq: Once | OROMUCOSAL | Status: AC
  Administered 2019-09-29: 15 mL via OROMUCOSAL
  Filled 2019-09-29: qty 15

## 2019-09-29 MED ORDER — MIDAZOLAM HCL 5 MG/5ML IJ SOLN
INTRAMUSCULAR | Status: DC | PRN
  Administered 2019-09-29: 2 mg via INTRAVENOUS

## 2019-09-29 MED ORDER — CHLORHEXIDINE GLUCONATE CLOTH 2 % EX PADS: 6.0000 | MEDICATED_PAD | Freq: Once | CUTANEOUS | Status: DC

## 2019-09-29 MED ORDER — 0.9 % SODIUM CHLORIDE (POUR BTL) OPTIME
TOPICAL | Status: DC | PRN
  Administered 2019-09-29: 1000 mL

## 2019-09-29 MED ORDER — PROPOFOL 10 MG/ML IV BOLUS
INTRAVENOUS | Status: DC | PRN
  Administered 2019-09-29: 140 mg via INTRAVENOUS

## 2019-09-29 MED ORDER — FENTANYL CITRATE (PF) 100 MCG/2ML IJ SOLN
INTRAMUSCULAR | Status: DC | PRN
  Administered 2019-09-29: 50 ug via INTRAVENOUS

## 2019-09-29 MED ORDER — DEXAMETHASONE SODIUM PHOSPHATE 10 MG/ML IJ SOLN
INTRAMUSCULAR | Status: AC
  Filled 2019-09-29: qty 1

## 2019-09-29 MED ORDER — ORAL CARE MOUTH RINSE: 15.0000 mL | Freq: Once | OROMUCOSAL | Status: AC

## 2019-09-29 MED ORDER — ONDANSETRON HCL 4 MG/2ML IJ SOLN
INTRAMUSCULAR | Status: AC
  Filled 2019-09-29: qty 2

## 2019-09-29 MED ORDER — FENTANYL CITRATE (PF) 250 MCG/5ML IJ SOLN
INTRAMUSCULAR | Status: AC
  Filled 2019-09-29: qty 5

## 2019-09-29 MED ORDER — BUPIVACAINE HCL (PF) 0.25 % IJ SOLN
INTRAMUSCULAR | Status: AC
  Filled 2019-09-29: qty 30

## 2019-09-29 MED ORDER — BUPIVACAINE HCL 0.25 % IJ SOLN
INTRAMUSCULAR | Status: DC | PRN
  Administered 2019-09-29: 10 mL

## 2019-09-29 MED ORDER — LIDOCAINE 2% (20 MG/ML) 5 ML SYRINGE
INTRAMUSCULAR | Status: AC
  Filled 2019-09-29: qty 5

## 2019-09-29 MED ORDER — HEPARIN SOD (PORK) LOCK FLUSH 100 UNIT/ML IV SOLN
INTRAVENOUS | Status: DC | PRN
  Administered 2019-09-29: 500 [IU]

## 2019-09-29 MED ORDER — FENTANYL CITRATE (PF) 100 MCG/2ML IJ SOLN: 25.0000 ug | INTRAMUSCULAR | Status: DC | PRN

## 2019-09-29 MED ORDER — SODIUM CHLORIDE 0.9 % IV SOLN
INTRAVENOUS | Status: AC
  Filled 2019-09-29: qty 1.2

## 2019-09-29 MED ORDER — OXYCODONE HCL 5 MG/5ML PO SOLN: 5.0000 mg | Freq: Once | ORAL | Status: DC | PRN

## 2019-09-29 MED ORDER — SODIUM CHLORIDE 0.9 % IV SOLN: INTRAVENOUS | Status: DC | PRN

## 2019-09-29 SURGICAL SUPPLY — 46 items
BAG DECANTER FOR FLEXI CONT (MISCELLANEOUS) ×3 IMPLANT
BENZOIN TINCTURE PRP APPL 2/3 (GAUZE/BANDAGES/DRESSINGS) ×3 IMPLANT
CHLORAPREP W/TINT 10.5 ML (MISCELLANEOUS) ×3 IMPLANT
CLOSURE WOUND 1/2 X4 (GAUZE/BANDAGES/DRESSINGS) ×1
CLOSURE WOUND 1/4X4 (GAUZE/BANDAGES/DRESSINGS) ×1
COVER SURGICAL LIGHT HANDLE (MISCELLANEOUS) ×3 IMPLANT
COVER TRANSDUCER ULTRASND GEL (DISPOSABLE) IMPLANT
COVER WAND RF STERILE (DRAPES) ×3 IMPLANT
DERMABOND ADVANCED (GAUZE/BANDAGES/DRESSINGS) ×2
DERMABOND ADVANCED .7 DNX12 (GAUZE/BANDAGES/DRESSINGS) IMPLANT
DRAPE C-ARM 42X120 X-RAY (DRAPES) ×3 IMPLANT
DRAPE LAPAROSCOPIC ABDOMINAL (DRAPES) ×3 IMPLANT
DRSG TEGADERM 4X4.75 (GAUZE/BANDAGES/DRESSINGS) ×3 IMPLANT
ELECT CAUTERY BLADE 6.4 (BLADE) ×3 IMPLANT
ELECT REM PT RETURN 9FT ADLT (ELECTROSURGICAL) ×3
ELECTRODE REM PT RTRN 9FT ADLT (ELECTROSURGICAL) ×1 IMPLANT
GAUZE 4X4 16PLY RFD (DISPOSABLE) ×3 IMPLANT
GAUZE SPONGE 2X2 8PLY STRL LF (GAUZE/BANDAGES/DRESSINGS) ×1 IMPLANT
GEL ULTRASOUND 20GR AQUASONIC (MISCELLANEOUS) IMPLANT
GLOVE BIO SURGEON STRL SZ7 (GLOVE) ×3 IMPLANT
GLOVE BIOGEL PI IND STRL 7.5 (GLOVE) ×1 IMPLANT
GLOVE BIOGEL PI INDICATOR 7.5 (GLOVE) ×2
GOWN STRL REUS W/ TWL LRG LVL3 (GOWN DISPOSABLE) ×2 IMPLANT
GOWN STRL REUS W/TWL LRG LVL3 (GOWN DISPOSABLE) ×6
INTRODUCER COOK 11FR (CATHETERS) IMPLANT
KIT BASIN OR (CUSTOM PROCEDURE TRAY) ×3 IMPLANT
KIT PORT POWER 8FR ISP CVUE (Port) ×2 IMPLANT
KIT TURNOVER KIT B (KITS) ×3 IMPLANT
NS IRRIG 1000ML POUR BTL (IV SOLUTION) ×3 IMPLANT
PAD ARMBOARD 7.5X6 YLW CONV (MISCELLANEOUS) ×3 IMPLANT
PENCIL BUTTON HOLSTER BLD 10FT (ELECTRODE) ×3 IMPLANT
POSITIONER HEAD DONUT 9IN (MISCELLANEOUS) ×3 IMPLANT
SET INTRODUCER 12FR PACEMAKER (INTRODUCER) IMPLANT
SET SHEATH INTRODUCER 10FR (MISCELLANEOUS) IMPLANT
SHEATH COOK PEEL AWAY SET 9F (SHEATH) IMPLANT
SPONGE GAUZE 2X2 STER 10/PKG (GAUZE/BANDAGES/DRESSINGS) ×2
STRIP CLOSURE SKIN 1/2X4 (GAUZE/BANDAGES/DRESSINGS) ×2 IMPLANT
STRIP CLOSURE SKIN 1/4X4 (GAUZE/BANDAGES/DRESSINGS) ×1 IMPLANT
SUT MNCRL AB 4-0 PS2 18 (SUTURE) ×3 IMPLANT
SUT PROLENE 2 0 SH DA (SUTURE) ×5 IMPLANT
SUT VIC AB 3-0 SH 27 (SUTURE) ×3
SUT VIC AB 3-0 SH 27X BRD (SUTURE) ×1 IMPLANT
SYR 5ML LUER SLIP (SYRINGE) ×3 IMPLANT
TOWEL GREEN STERILE (TOWEL DISPOSABLE) ×3 IMPLANT
TOWEL GREEN STERILE FF (TOWEL DISPOSABLE) ×3 IMPLANT
TRAY LAPAROSCOPIC MC (CUSTOM PROCEDURE TRAY) ×3 IMPLANT

## 2019-09-29 NOTE — Anesthesia Procedure Notes (Signed)
Procedure Name: Intubation Date/Time: 09/29/2019 7:30 AM Performed by: Moshe Salisbury, CRNA Pre-anesthesia Checklist: Patient identified, Emergency Drugs available, Suction available and Patient being monitored Patient Re-evaluated:Patient Re-evaluated prior to induction Oxygen Delivery Method: Circle System Utilized Preoxygenation: Pre-oxygenation with 100% oxygen Induction Type: IV induction Ventilation: Mask ventilation without difficulty LMA: LMA inserted LMA Size: 4.0 Tube type: Oral Number of attempts: 1 Airway Equipment and Method: Stylet Placement Confirmation: ETT inserted through vocal cords under direct vision,  positive ETCO2 and breath sounds checked- equal and bilateral Tube secured with: Tape Dental Injury: Teeth and Oropharynx as per pre-operative assessment

## 2019-09-29 NOTE — Discharge Instructions (Signed)
    PORT-A-CATH: POST OP INSTRUCTIONS  Always review your discharge instruction sheet given to you by the facility where your surgery was performed.   1. A prescription for pain medication may be given to you upon discharge. Take your pain medication as prescribed, if needed. If narcotic pain medicine is not needed, then you make take acetaminophen (Tylenol) or ibuprofen (Advil) as needed.  2. Take your usually prescribed medications unless otherwise directed. 3. If you need a refill on your pain medication, please contact our office. All narcotic pain medicine now requires a paper prescription.  Phoned in and fax refills are no longer allowed by law.  Prescriptions will not be filled after 5 pm or on weekends.  4. You should follow a light diet for the remainder of the day after your procedure. 5. Most patients will experience some mild swelling and/or bruising in the area of the incision. It may take several days to resolve. 6. It is common to experience some constipation if taking pain medication after surgery. Increasing fluid intake and taking a stool softener (such as Colace) will usually help or prevent this problem from occurring. A mild laxative (Milk of Magnesia or Miralax) should be taken according to package directions if there are no bowel movements after 48 hours.  7. Unless discharge instructions indicate otherwise, you may remove your bandages 48 hours after surgery, and you may shower at that time. You may have steri-strips (small white skin tapes) in place directly over the incision.  These strips should be left on the skin for 7-10 days.  If your surgeon used Dermabond (skin glue) on the incision, you may shower in 24 hours.  The glue will flake off over the next 2-3 weeks.  8. If your port is left accessed at the end of surgery (needle left in port), the dressing cannot get wet and should only by changed by a healthcare professional. When the port is no longer accessed (when the  needle has been removed), follow step 7.   9. ACTIVITIES:  Limit activity involving your arms for the next 72 hours. Do no strenuous exercise or activity for 1 week. You may drive when you are no longer taking prescription pain medication, you can comfortably wear a seatbelt, and you can maneuver your car. 10.You may need to see your doctor in the office for a follow-up appointment.  Please       check with your doctor.  11.When you receive a new Port-a-Cath, you will get a product guide and        ID card.  Please keep them in case you need them.  WHEN TO CALL YOUR DOCTOR ([phone]): 1. Fever over 101.0 2. Chills 3. Continued bleeding from incision 4. Increased redness and tenderness at the site 5. Shortness of breath, difficulty breathing   The clinic staff is available to answer your questions during regular business hours. Please don't hesitate to call and ask to speak to one of the nurses or medical assistants for clinical concerns. If you have a medical emergency, go to the nearest emergency room or call 911.  A surgeon from [facility] is always on call at the hospital.     For further information, please visit [email or web address]      

## 2019-09-29 NOTE — Anesthesia Postprocedure Evaluation (Signed)
Anesthesia Post Note  Patient: Kim Fuller  Procedure(s) Performed: INSERTION PORT-A-CATH (N/A Chest)     Patient location during evaluation: PACU Anesthesia Type: General Level of consciousness: awake and alert Pain management: pain level controlled Vital Signs Assessment: post-procedure vital signs reviewed and stable Respiratory status: spontaneous breathing, nonlabored ventilation and respiratory function stable Cardiovascular status: blood pressure returned to baseline and stable Postop Assessment: no apparent nausea or vomiting Anesthetic complications: no    Last Vitals:  Vitals:   09/29/19 0915 09/29/19 0919  BP:  133/67  Pulse: (!) 59 61  Resp: 14 17  Temp:  36.6 C  SpO2: 100% 98%    Last Pain:  Vitals:   09/29/19 0835  TempSrc:   PainSc: Asleep                 Lidia Collum

## 2019-09-29 NOTE — Op Note (Signed)
Diagnosis: Left breast cancer triple negative Postop diagnosis: Same Procedure performed: Right subclavian vein port placement Surgeon:Fatema Rabe K Anayia Eugene Anesthesia: General via LMA Indications: This is a 74 year old female who recently underwent lumpectomy and sentinel lymph node biopsy for invasive ductal carcinoma of the left breast.  Further work-up revealed the need for chemotherapy.  She presents now for port placement.  Description of procedure: The patient is brought to the operating supine position on the operating room table.  A roll was placed behind her shoulders.  Her arms were tucked at her sides.  After an adequate level of general anesthesia was obtained, her right neck and chest were prepped with ChloraPrep and draped sterile fashion.  A timeout was taken to ensure the proper patient and proper procedure.  I interrogated the right side of her neck with the ultrasound.  The internal jugular vein was identified.  We cannulated the internal jugular vein with the 18-gauge needle.  There was good blood return.  I attempted to pass the wire but was unable to advance the wire down the internal jugular vein.  I removed the needle and the wire.  We then repositioned and I attempted a slightly different angle.  Again I was unable to pass the wire into the jugular vein.  Therefore, I moved down to the area below the clavicle.  I was able to easily cannulate the right subclavian vein on the first pass with good blood return.  The wire was easily passed.  Fluoroscopy confirmed that the wire passed on the right side of mediastinum into the right atrium.  The needle was removed.  I created a subcutaneous pocket below the right clavicle.  We assembled the 8 [place] port.  We passed this from the subcutaneous pocket to the insertion site.  The dilator and breakaway sheath were then passed over the wire.  The wire and dilator were removed.  The catheter was advanced through the sheath and the sheath was  removed.  Fluoroscopy confirmed that there were no kinks along the course of the catheter.  The tip of the catheter is over the right atrium.  We were able to aspirate blood easily and were able flushed easily.  The port was secured with 2-0 Prolene sutures.  We instilled concentrated heparin solution.  The incisions were closed with 3-0 Vicryl 4-0 Monocryl.  Benzoin Steri-Strips were applied.  The patient was then extubated and brought to recovery room in stable condition.  All sponge, instrument, and needle counts are correct.  Imogene Burn. Georgette Dover, MD, [facility]  General/ Trauma Surgery   09/29/2019 8:40 AM

## 2019-09-29 NOTE — Interval H&P Note (Signed)
History and Physical Interval Note:  [id number] AM  Kim Fuller  has presented today for surgery, with the diagnosis of LEFT BREAST CANCER.    After discussion with the patient, chemotherapy is planned for this patient.  The various methods of treatment have been discussed with the patient and family. After consideration of risks, benefits and other options for treatment, the patient has consented to  Procedure(s) with comments: [place] (N/A) - LMA as a surgical intervention.  The patient's history has been reviewed, patient examined, no change in status, stable for surgery.  I have reviewed the patient's chart and labs.  Questions were answered to the patient's satisfaction.     [name]

## 2019-09-29 NOTE — Transfer of Care (Signed)
Immediate Anesthesia Transfer of Care Note  Patient: Kim Fuller  Procedure(s) Performed: INSERTION PORT-A-CATH (N/A Chest)  Patient Location: PACU  Anesthesia Type:General  Level of Consciousness: drowsy and patient cooperative  Airway & Oxygen Therapy: Patient Spontanous Breathing and Patient connected to nasal cannula oxygen  Post-op Assessment: Report given to RN and Post -op Vital signs reviewed and stable  Post vital signs: Reviewed and stable  Last Vitals:  Vitals Value Taken Time  BP 121/66 09/29/19 0835  Temp    Pulse 56 09/29/19 0837  Resp 17 09/29/19 0837  SpO2 100 % 09/29/19 0837  Vitals shown include unvalidated device data.  Last Pain:  Vitals:   09/29/19 0607  TempSrc:   PainSc: 0-No pain         Complications: No apparent anesthesia complications

## 2019-10-01 ENCOUNTER — Other Ambulatory Visit: Payer: Self-pay

## 2019-10-01 ENCOUNTER — Inpatient Hospital Stay: Payer: Medicare Other

## 2019-10-07 ENCOUNTER — Telehealth: Payer: Self-pay | Admitting: Hematology and Oncology

## 2019-10-07 ENCOUNTER — Telehealth: Payer: Self-pay

## 2019-10-07 ENCOUNTER — Telehealth: Payer: Self-pay | Admitting: *Deleted

## 2019-10-07 NOTE — Telephone Encounter (Signed)
Received call from pt with complaint of soar throat related to allergies and post nasal drip.  Per MD pt to rinse with warm salt water and to take OTC mucinex DM.  Pt verbalized understanding and appreciative of the advice.

## 2019-10-07 NOTE — Telephone Encounter (Signed)
TC from Pt stating that she had lymph nodes removed from her breast she stated that her was a little red and if she should worry about this. Asked Pt if she had any discharge from her nipple she stated, There is no discharge from the nipple area. She stated that she has been putting and ice pack on it which makes it feel better informed her to continue to put the ice pack if it soothes it.  She stated there was no pain. Informed Pt that she has an appointment with Dr Lindi Adie on 6/8 /21 and if this continues to bother she can give [place] a call to see if she can be seen earlier. Pt verbalized understanding. No further problems or concerns noted.

## 2019-10-07 NOTE — Telephone Encounter (Signed)
Rescheduled appt on 6/17 to 6/212. Provider on pal. Pt aware of appts.

## 2019-10-09 NOTE — Progress Notes (Signed)
Pharmacist Chemotherapy Monitoring - Initial Assessment    Anticipated start date: 10/15/19    Regimen:  . Are orders appropriate based on the patient's diagnosis, regimen, and cycle? Yes . Does the plan date match the patient's scheduled date? Yes . Is the sequencing of drugs appropriate? Yes . Are the premedications appropriate for the patient's regimen? Yes . Prior Authorization for treatment is: Approved o If applicable, is the correct biosimilar selected based on the patient's insurance? not applicable  Organ Function and Labs: Marland Kitchen Are dose adjustments needed based on the patient's renal function, hepatic function, or hematologic function? No . Are appropriate labs ordered prior to the start of patient's treatment? Yes . Other organ system assessment, if indicated: N/A . The following baseline labs, if indicated, have been ordered: N/A  Dose Assessment: . Are the drug doses appropriate? Yes . Are the following correct: o Drug concentrations Yes o IV fluid compatible with drug Yes o Administration routes Yes o Timing of therapy Yes . If applicable, does the patient have documented access for treatment and/or plans for port-a-cath placement? not applicable . If applicable, have lifetime cumulative doses been properly documented and assessed? not applicable Lifetime Dose Tracking  No doses have been documented on this patient for the following tracked chemicals: Doxorubicin, Epirubicin, Idarubicin, Daunorubicin, Mitoxantrone, Bleomycin, Oxaliplatin, Carboplatin, Liposomal Doxorubicin  o   Toxicity Monitoring/Prevention: . The patient has the following take home antiemetics prescribed: Ondansetron and Prochlorperazine . The patient has the following take home medications prescribed: N/A . Medication allergies and previous infusion related reactions, if applicable, have been reviewed and addressed. Yes . The patient's current medication list has been assessed for drug-drug  interactions with their chemotherapy regimen. no significant drug-drug interactions were identified on review.  Order Review: . Are the treatment plan orders signed? Yes . Is the patient scheduled to see a provider prior to their treatment? Yes  I verify that I have reviewed each item in the above checklist and answered each question accordingly.  Brynnley Dayrit K 10/09/2019 10:19 AM

## 2019-10-14 ENCOUNTER — Encounter: Payer: Self-pay | Admitting: Hematology and Oncology

## 2019-10-14 NOTE — Progress Notes (Signed)
Patient Care Team: Hoyt Koch, MD as PCP - General (Internal Medicine) Mauro Kaufmann, RN as Oncology Nurse Navigator Rockwell Germany, RN as Oncology Nurse Navigator Donnie Mesa, MD as Consulting Physician (General Surgery) Nicholas Lose, MD as Consulting Physician (Hematology and Oncology) Kyung Rudd, MD as Consulting Physician (Radiation Oncology)  DIAGNOSIS:    ICD-10-CM   1. Malignant neoplasm of upper-outer quadrant of left breast in female, estrogen receptor negative ([place])  C50.412    Z17.1     SUMMARY OF ONCOLOGIC HISTORY: Oncology History  Malignant neoplasm of upper-outer quadrant of left breast in female, estrogen receptor negative ([place])  09/08/2019 Initial Diagnosis   Screening mammogram detected a left breast asymmetry. Diagnostic mammogram and US showed a 0.5cm mass, 2 o'clock position, with no axillary adenopathy. Biopsy showed IDC, grade 3, HER-2 - (0), ER/PR -, Ki67 50%.   09/09/2019 Cancer Staging   Staging form: Breast, AJCC 8th Edition - Clinical stage from 09/09/2019: Stage IB (cT1a, cN0, cM0, G3, ER-, PR-, HER2-) - Signed by Nicholas Lose, MD on 09/09/2019   09/16/2019 Surgery   Left lumpectomy (Tsuei): IDC, grade 3, 0.9cm, with high grade DCIS, 4 left axillary lymph nodes negative.    Adjuvant Chemotherapy   CMF x 8    Radiation Therapy     09/16/2019 Cancer Staging   Staging form: Breast, AJCC 8th Edition - Pathologic stage from 09/16/2019: Stage IB (pT1b, pN0, cM0, G3, ER-, PR-, HER2-) - Signed by Gardenia Phlegm, NP on 10/07/2019     CHIEF COMPLIANT: Cycle 1 CMF  INTERVAL HISTORY: Kim Fuller is a 74 y.o. with above-mentioned history of triple negative left breast cancer. She underwent a left lumpectomy and is currently on adjuvant chemotherapy with CMF. Her port was inserted by Dr. Georgette Dover on 09/29/19. She presents to the clinic todayfor cycle 1.  Patient complains of the left breast being slightly erythematous for the past few  days and a feeling of marked underneath the scar tissue of the surgical scar.  She does not have any fevers or chills.  The breast itself does not feel any warmer than before.  Therefore we decided to proceed with treatment along with giving antibiotics.  She is anxious about getting started on treatment today.  ALLERGIES:  is allergic to sulfonamide derivatives and tramadol hcl.  MEDICATIONS:  Current Outpatient Medications  Medication Sig Dispense Refill  . acetaminophen (TYLENOL) 500 MG tablet Take 500-1,000 mg by mouth every 6 (six) hours as needed (for pain.).    Marland Kitchen aspirin 81 MG chewable tablet Chew 81 mg by mouth at bedtime.     . benazepril (LOTENSIN) 40 MG tablet Take 0.5 tablets (20 mg total) by mouth daily. 45 tablet 3  . bisoprolol-hydrochlorothiazide (ZIAC) 5-6.25 MG tablet Take 1 tablet by mouth daily. 90 tablet 3  . calcium carbonate (OSCAL) 1500 (600 Ca) MG TABS tablet Take 600 mg of elemental calcium by mouth 2 (two) times daily with a meal.    . cephALEXin (KEFLEX) 500 MG capsule Take 1 capsule (500 mg total) by mouth 2 (two) times daily. 14 capsule 0  . cholecalciferol (VITAMIN D3) 10 MCG (400 UNIT) TABS tablet Take 400 Units by mouth at bedtime.     Marland Kitchen esomeprazole (NEXIUM) 20 MG capsule Take 20 mg by mouth daily as needed (acid reflux/indigestion.).    Marland Kitchen fluticasone (FLONASE) 50 MCG/ACT nasal spray USE TWO SPRAY(S) IN EACH NOSTRIL DAILY (Patient taking differently: Place 1-2 sprays into both nostrils daily  as needed (allergies.). ) 48 g 6  . ibuprofen (ADVIL) 200 MG tablet Take 400 mg by mouth every 8 (eight) hours as needed (pain.).     Marland Kitchen lidocaine-prilocaine (EMLA) cream Apply to affected area once 30 g 3  . Multiple Vitamin (MULTIVITAMIN WITH MINERALS) TABS tablet Take 1 tablet by mouth daily. Centrum Silver    . Omega-3 Fatty Acids (FISH OIL) 1000 MG CAPS Take 1,000 mg by mouth 2 (two) times daily.    . ondansetron (ZOFRAN) 8 MG tablet Take 1 tablet (8 mg total) by mouth 2  (two) times daily as needed for refractory nausea / vomiting. Start on day 3 after chemotherapy. 30 tablet 1  . pravastatin (PRAVACHOL) 20 MG tablet Take 1 tablet (20 mg total) by mouth daily. (Patient taking differently: Take 20 mg by mouth every evening. ) 90 tablet 3  . prochlorperazine (COMPAZINE) 10 MG tablet Take 1 tablet (10 mg total) by mouth every 6 (six) hours as needed (Nausea or vomiting). 30 tablet 1  . Propylene Glycol (SYSTANE BALANCE) 0.6 % SOLN Place 1 drop 3 (three) times daily as needed into both eyes (dry eyes).    . vitamin E 400 UNIT capsule Take 400 Units by mouth daily.     No current facility-administered medications for this visit.    PHYSICAL EXAMINATION: ECOG PERFORMANCE STATUS: 1 - Symptomatic but completely ambulatory  Vitals:   10/15/19 0806  BP: 123/64  Pulse: 63  Resp: 17  Temp: 98.3 F (36.8 C)  SpO2: 95%   Filed Weights   10/15/19 0806  Weight: 213 lb 14.4 oz (97 kg)    LABORATORY DATA:  I have reviewed the data as listed CMP Latest Ref Rng & Units 09/15/2019 09/09/2019 04/24/2019  Glucose 70 - 99 mg/dL 99 89 93  BUN 8 - 23 mg/dL _0 Creatinine 0.44 - 1.00 mg/dL 0.79 0.79 0.78  Sodium 135 - 145 mmol/L 141 144 139  Potassium 3.5 - 5.1 mmol/L 5.3(H) 3.8 4.4  Chloride 98 - 111 mmol/L 104 102 102  CO2 22 - 32 mmol/L 29 33(H) 32  Calcium 8.9 - 10.3 mg/dL 9.3 10.4(H) 10.0  Total Protein 6.5 - 8.1 g/dL - 7.5 7.4  Total Bilirubin 0.3 - 1.2 mg/dL - 0.6 0.6  Alkaline Phos 38 - 126 U/L - 53 56  AST 15 - 41 U/L - 18 22  ALT 0 - 44 U/L - 17 23    Lab Results  Component Value Date   WBC 5.6 10/15/2019   HGB 12.7 10/15/2019   HCT 38.8 10/15/2019   MCV 99.5 10/15/2019   PLT 167 10/15/2019   NEUTROABS 2.9 10/15/2019    ASSESSMENT & PLAN:  Malignant neoplasm of upper-outer quadrant of left breast in female, estrogen receptor negative ([place]) 09/16/2018: Left lumpectomy (Tsuei): IDC, grade 3, 0.9cm, with high grade DCIS, 4 left axillary lymph  nodes negative.  Triple negative with a Ki-67 of 50%  Treatment plan: 1.  Adjuvant chemotherapy with CMF 2.  Adjuvant radiation therapy ------------------------------------------------------------------------------------------------------------------------------------------------------------ Current Treatment: Cycle 1 CMF Labs reviewed Chemo consent obtained, chemo education completed  Breast erythema: There is no fever and it is not any warmer to touch than the opposite side.  Therefore I started her on Keflex 100 twice daily for 7 days. We will proceed with today's chemotherapy as planned.  She is getting a lower dosage of CMF given her age.  RTC in 1 week for tox check    No orders  of the defined types were placed in this encounter.  The patient has a good understanding of the overall plan. she agrees with it. she will call with any problems that may develop before the next visit here.  Total time spent: 30 mins including face to face time and time spent for planning, charting and coordination of care  Nicholas Lose, MD 10/15/2019  I, Cloyde Reams Dorshimer, am acting as scribe for Dr. Nicholas Lose.  I have reviewed the above documentation for accuracy and completeness, and I agree with the above.

## 2019-10-14 NOTE — Progress Notes (Signed)
Called pt to introduce myself as her Arboriculturist.  Pt has 2 insurances so copay assistance shouldn't be needed.  I offered the [employer], went over what it covers and gave her the income requirement.  She would like to apply so she will bring proof of income on 10/15/19.  Once approved I will give her an expense sheet and my card for any questions or concerns she may have in the future.

## 2019-10-15 ENCOUNTER — Inpatient Hospital Stay (HOSPITAL_BASED_OUTPATIENT_CLINIC_OR_DEPARTMENT_OTHER): Payer: Medicare Other | Admitting: Hematology and Oncology

## 2019-10-15 ENCOUNTER — Inpatient Hospital Stay: Payer: Medicare Other

## 2019-10-15 ENCOUNTER — Encounter: Payer: Self-pay | Admitting: *Deleted

## 2019-10-15 ENCOUNTER — Inpatient Hospital Stay: Payer: Medicare Other | Attending: Hematology and Oncology

## 2019-10-15 ENCOUNTER — Other Ambulatory Visit: Payer: Self-pay

## 2019-10-15 ENCOUNTER — Encounter: Payer: Self-pay | Admitting: Hematology and Oncology

## 2019-10-15 DIAGNOSIS — C50412 Malignant neoplasm of upper-outer quadrant of left female breast: Secondary | ICD-10-CM | POA: Diagnosis not present

## 2019-10-15 DIAGNOSIS — Z5111 Encounter for antineoplastic chemotherapy: Secondary | ICD-10-CM | POA: Insufficient documentation

## 2019-10-15 DIAGNOSIS — Z171 Estrogen receptor negative status [ER-]: Secondary | ICD-10-CM

## 2019-10-15 LAB — CMP (CANCER CENTER ONLY)
ALT: 18 U/L (ref 0–44)
AST: 17 U/L (ref 15–41)
Albumin: 3.6 g/dL (ref 3.5–5.0)
Alkaline Phosphatase: 55 U/L (ref 38–126)
Anion gap: 8 (ref 5–15)
BUN: 13 mg/dL (ref 8–23)
CO2: 27 mmol/L (ref 22–32)
Calcium: 9 mg/dL (ref 8.9–10.3)
Chloride: 105 mmol/L (ref 98–111)
Creatinine: 0.78 mg/dL (ref 0.44–1.00)
GFR, Est AFR Am: >60 mL/min (ref >60)
GFR, Estimated: >60 mL/min (ref >60)
Glucose, Bld: 99 mg/dL (ref 70–99)
Potassium: 3.6 mmol/L (ref 3.5–5.1)
Sodium: 140 mmol/L (ref 135–145)
Total Bilirubin: 0.5 mg/dL (ref 0.3–1.2)
Total Protein: 6.9 g/dL (ref 6.5–8.1)

## 2019-10-15 LAB — CBC WITH DIFFERENTIAL (CANCER CENTER ONLY)
Abs Immature Granulocytes: 0.02 10*3/uL (ref 0.00–0.07)
Basophils Absolute: 0.1 10*3/uL (ref 0.0–0.1)
Basophils Relative: 1 %
Eosinophils Absolute: 0.1 10*3/uL (ref 0.0–0.5)
Eosinophils Relative: 2 %
HCT: 38.8 % (ref 36.0–46.0)
Hemoglobin: 12.7 g/dL (ref 12.0–15.0)
Immature Granulocytes: 0 %
Lymphocytes Relative: 31 %
Lymphs Abs: 1.7 10*3/uL (ref 0.7–4.0)
MCH: 32.6 pg (ref 26.0–34.0)
MCHC: 32.7 g/dL (ref 30.0–36.0)
MCV: 99.5 fL (ref 80.0–100.0)
Monocytes Absolute: 0.8 10*3/uL (ref 0.1–1.0)
Monocytes Relative: 14 %
Neutro Abs: 2.9 10*3/uL (ref 1.7–7.7)
Neutrophils Relative %: 52 %
Platelet Count: 167 10*3/uL (ref 150–400)
RBC: 3.9 MIL/uL (ref 3.87–5.11)
RDW: 12.5 % (ref 11.5–15.5)
WBC Count: 5.6 10*3/uL (ref 4.0–10.5)
nRBC: 0 % (ref 0.0–0.2)

## 2019-10-15 MED ORDER — SODIUM CHLORIDE 0.9 % IV SOLN
500.0000 mg/m2 | Freq: Once | INTRAVENOUS | Status: AC
  Administered 2019-10-15: 1040 mg via INTRAVENOUS
  Filled 2019-10-15: qty 52

## 2019-10-15 MED ORDER — PALONOSETRON HCL INJECTION 0.25 MG/5ML
INTRAVENOUS | Status: AC
  Filled 2019-10-15: qty 5

## 2019-10-15 MED ORDER — PALONOSETRON HCL INJECTION 0.25 MG/5ML
0.2500 mg | Freq: Once | INTRAVENOUS | Status: AC
  Administered 2019-10-15: 0.25 mg via INTRAVENOUS

## 2019-10-15 MED ORDER — FLUOROURACIL CHEMO INJECTION 2.5 GM/50ML
500.0000 mg/m2 | Freq: Once | INTRAVENOUS | Status: AC
  Administered 2019-10-15: 1050 mg via INTRAVENOUS
  Filled 2019-10-15: qty 21

## 2019-10-15 MED ORDER — METHOTREXATE SODIUM (PF) CHEMO INJECTION 250 MG/10ML
30.0000 mg/m2 | Freq: Once | INTRAMUSCULAR | Status: AC
  Administered 2019-10-15: 62 mg via INTRAVENOUS
  Filled 2019-10-15: qty 2.48

## 2019-10-15 MED ORDER — SODIUM CHLORIDE 0.9% FLUSH
10.0000 mL | INTRAVENOUS | Status: DC | PRN
  Administered 2019-10-15: 10 mL
  Filled 2019-10-15: qty 10

## 2019-10-15 MED ORDER — SODIUM CHLORIDE 0.9 % IV SOLN
10.0000 mg | Freq: Once | INTRAVENOUS | Status: AC
  Administered 2019-10-15: 10 mg via INTRAVENOUS
  Filled 2019-10-15: qty 10

## 2019-10-15 MED ORDER — SODIUM CHLORIDE 0.9 % IV SOLN
Freq: Once | INTRAVENOUS | Status: AC
  Filled 2019-10-15: qty 250

## 2019-10-15 MED ORDER — HEPARIN SOD (PORK) LOCK FLUSH 100 UNIT/ML IV SOLN
500.0000 [IU] | Freq: Once | INTRAVENOUS | Status: AC | PRN
  Administered 2019-10-15: 500 [IU]
  Filled 2019-10-15: qty 5

## 2019-10-15 MED ORDER — CEPHALEXIN 500 MG PO CAPS
500.0000 mg | ORAL_CAPSULE | Freq: Two times a day (BID) | ORAL | 0 refills | Status: DC
Start: 2019-10-15 — End: 2020-01-08

## 2019-10-15 NOTE — Assessment & Plan Note (Signed)
09/16/2018: Left lumpectomy (Tsuei): IDC, grade 3, 0.9cm, with high grade DCIS, 4 left axillary lymph nodes negative.  Triple negative with a Ki-67 of 50%  Treatment plan: 1.  Adjuvant chemotherapy with CMF 2.  Adjuvant radiation therapy ------------------------------------------------------------------------------------------------------------------------------------------------------------ Current Treatment: Cycle 1 CMF Labs reviewed Chemo consent obtained, chemo education completed RTC in 1 week for tox check

## 2019-10-15 NOTE — Patient Instructions (Signed)
[facility] Discharge Instructions for Patients Receiving Chemotherapy  Today you received the following chemotherapy agents: cyclophosphamide, methotrexate, and fluorouracil.  To help prevent nausea and vomiting after your treatment, we encourage you to take your nausea medication as directed.  If you develop nausea and vomiting that is not controlled by your nausea medication, call the clinic.   BELOW ARE SYMPTOMS THAT SHOULD BE REPORTED IMMEDIATELY:  *FEVER GREATER THAN 100.5 F  *CHILLS WITH OR WITHOUT FEVER  NAUSEA AND VOMITING THAT IS NOT CONTROLLED WITH YOUR NAUSEA MEDICATION  *UNUSUAL SHORTNESS OF BREATH  *UNUSUAL BRUISING OR BLEEDING  TENDERNESS IN MOUTH AND THROAT WITH OR WITHOUT PRESENCE OF ULCERS  *URINARY PROBLEMS  *BOWEL PROBLEMS  UNUSUAL RASH Items with * indicate a potential emergency and should be followed up as soon as possible.  Feel free to call the clinic should you have any questions or concerns. The clinic phone number is [phone].  Please show the [facility] at check-in to the Emergency Department and triage nurse.  Cyclophosphamide Injection What is this medicine? CYCLOPHOSPHAMIDE (sye kloe FOSS fa mide) is a chemotherapy drug. It slows the growth of cancer cells. This medicine is used to treat many types of cancer like lymphoma, myeloma, leukemia, breast cancer, and ovarian cancer, to name a few. This medicine may be used for other purposes; ask your health care provider or pharmacist if you have questions. COMMON BRAND NAME(S): Cytoxan, Neosar What should I tell my health care provider before I take this medicine? They need to know if you have any of these conditions:  heart disease  history of irregular heartbeat  infection  kidney disease  liver disease  low blood counts, like white cells, platelets, or red blood cells  on hemodialysis  recent or ongoing radiation therapy  scarring or thickening of the  lungs  trouble passing urine  an unusual or allergic reaction to cyclophosphamide, other medicines, foods, dyes, or preservatives  pregnant or trying to get pregnant  breast-feeding How should I use this medicine? This drug is usually given as an injection into a vein or muscle or by infusion into a vein. It is administered in a hospital or clinic by a specially trained health care professional. Talk to your pediatrician regarding the use of this medicine in children. Special care may be needed. Overdosage: If you think you have taken too much of this medicine contact a poison control center or emergency room at once. NOTE: This medicine is only for you. Do not share this medicine with others. What if I miss a dose? It is important not to miss your dose. Call your doctor or health care professional if you are unable to keep an appointment. What may interact with this medicine?  amphotericin B  azathioprine  certain antivirals for HIV or hepatitis  certain medicines for blood pressure, heart disease, irregular heart beat  certain medicines that treat or prevent blood clots like warfarin  certain other medicines for cancer  cyclosporine  etanercept  indomethacin  medicines that relax muscles for surgery  medicines to increase blood counts  metronidazole This list may not describe all possible interactions. Give your health care provider a list of all the medicines, herbs, non-prescription drugs, or dietary supplements you use. Also tell them if you smoke, drink alcohol, or use illegal drugs. Some items may interact with your medicine. What should I watch for while using this medicine? Your condition will be monitored carefully while you are receiving this  medicine. You may need blood work done while you are taking this medicine. Drink water or other fluids as directed. Urinate often, even at night. Some products may contain alcohol. Ask your health care professional if  this medicine contains alcohol. Be sure to tell all health care professionals you are taking this medicine. Certain medicines, like metronidazole and disulfiram, can cause an unpleasant reaction when taken with alcohol. The reaction includes flushing, headache, nausea, vomiting, sweating, and increased thirst. The reaction can last from 30 minutes to several hours. Do not become pregnant while taking this medicine or for 1 year after stopping it. Women should inform their health care professional if they wish to become pregnant or think they might be pregnant. Men should not father a child while taking this medicine and for 4 months after stopping it. There is potential for serious side effects to an unborn child. Talk to your health care professional for more information. Do not breast-feed an infant while taking this medicine or for 1 week after stopping it. This medicine has caused ovarian failure in some women. This medicine may make it more difficult to get pregnant. Talk to your health care professional if you are concerned about your fertility. This medicine has caused decreased sperm counts in some men. This may make it more difficult to father a child. Talk to your health care professional if you are concerned about your fertility. Call your health care professional for advice if you get a fever, chills, or sore throat, or other symptoms of a cold or flu. Do not treat yourself. This medicine decreases your body's ability to fight infections. Try to avoid being around people who are sick. Avoid taking medicines that contain aspirin, acetaminophen, ibuprofen, naproxen, or ketoprofen unless instructed by your health care professional. These medicines may hide a fever. Talk to your health care professional about your risk of cancer. You may be more at risk for certain types of cancer if you take this medicine. If you are going to need surgery or other procedure, tell your health care professional that  you are using this medicine. Be careful brushing or flossing your teeth or using a toothpick because you may get an infection or bleed more easily. If you have any dental work done, tell your dentist you are receiving this medicine. What side effects may I notice from receiving this medicine? Side effects that you should report to your doctor or health care professional as soon as possible:  allergic reactions like skin rash, itching or hives, swelling of the face, lips, or tongue  breathing problems  nausea, vomiting  signs and symptoms of bleeding such as bloody or black, tarry stools; red or dark brown urine; spitting up blood or brown material that looks like coffee grounds; red spots on the skin; unusual bruising or bleeding from the eyes, gums, or nose  signs and symptoms of heart failure like fast, irregular heartbeat, sudden weight gain; swelling of the ankles, feet, hands  signs and symptoms of infection like fever; chills; cough; sore throat; pain or trouble passing urine  signs and symptoms of kidney injury like trouble passing urine or change in the amount of urine  signs and symptoms of liver injury like dark yellow or brown urine; general ill feeling or flu-like symptoms; light-colored stools; loss of appetite; nausea; right upper belly pain; unusually weak or tired; yellowing of the eyes or skin Side effects that usually do not require medical attention (report to your doctor or health care  professional if they continue or are bothersome):  confusion  decreased hearing  diarrhea  facial flushing  hair loss  headache  loss of appetite  missed menstrual periods  signs and symptoms of low red blood cells or anemia such as unusually weak or tired; feeling faint or lightheaded; falls  skin discoloration This list may not describe all possible side effects. Call your doctor for medical advice about side effects. You may report side effects to FDA at  1-800-FDA-1088. Where should I keep my medicine? This drug is given in a hospital or clinic and will not be stored at home. NOTE: This sheet is a summary. It may not cover all possible information. If you have questions about this medicine, talk to your doctor, pharmacist, or health care provider.  2020 Elsevier/Gold Standard (2019-01-26 09:53:29)  Methotrexate injection What is this medicine? METHOTREXATE (METH oh TREX ate) is a chemotherapy drug used to treat cancer including breast cancer, leukemia, and lymphoma. This medicine can also be used to treat psoriasis and certain kinds of arthritis. This medicine may be used for other purposes; ask your health care provider or pharmacist if you have questions. What should I tell my health care provider before I take this medicine? They need to know if you have any of these conditions:  fluid in the stomach area or lungs  if you often drink alcohol  infection or immune system problems  kidney disease  liver disease  low blood counts, like low white cell, platelet, or red cell counts  lung disease  radiation therapy  stomach ulcers  ulcerative colitis  an unusual or allergic reaction to methotrexate, other medicines, foods, dyes, or preservatives  pregnant or trying to get pregnant  breast-feeding How should I use this medicine? This medicine is for infusion into a vein or for injection into muscle or into the spinal fluid (whichever applies). It is usually given by a health care professional in a hospital or clinic setting. In rare cases, you might get this medicine at home. You will be taught how to give this medicine. Use exactly as directed. Take your medicine at regular intervals. Do not take your medicine more often than directed. If this medicine is used for arthritis or psoriasis, it should be taken weekly, NOT daily. It is important that you put your used needles and syringes in a special sharps container. Do not put  them in a trash can. If you do not have a sharps container, call your pharmacist or healthcare provider to get one. Talk to your pediatrician regarding the use of this medicine in children. While this drug may be prescribed for children as young as 2 years for selected conditions, precautions do apply. Overdosage: If you think you have taken too much of this medicine contact a poison control center or emergency room at once. NOTE: This medicine is only for you. Do not share this medicine with others. What if I miss a dose? It is important not to miss your dose. Call your doctor or health care professional if you are unable to keep an appointment. If you give yourself the medicine and you miss a dose, talk with your doctor or health care professional. Do not take double or extra doses. What may interact with this medicine? This medicine may interact with the following medications:  acitretin  aspirin or aspirin-like medicines including salicylates  azathioprine  certain antibiotics like chloramphenicol, penicillin, tetracycline  certain medicines for stomach problems like esomeprazole, omeprazole, pantoprazole  cyclosporine  gold  hydroxychloroquine  live virus vaccines  mercaptopurine  NSAIDs, medicines for pain and inflammation, like ibuprofen or naproxen  other cytotoxic agents  penicillamine  phenylbutazone  phenytoin  probenacid  retinoids such as isotretinoin and tretinoin  steroid medicines like prednisone or cortisone  sulfonamides like sulfasalazine and trimethoprim/sulfamethoxazole  theophylline This list may not describe all possible interactions. Give your health care provider a list of all the medicines, herbs, non-prescription drugs, or dietary supplements you use. Also tell them if you smoke, drink alcohol, or use illegal drugs. Some items may interact with your medicine. What should I watch for while using this medicine? Avoid alcoholic drinks. In  some cases, you may be given additional medicines to help with side effects. Follow all directions for their use. This medicine can make you more sensitive to the sun. Keep out of the sun. If you cannot avoid being in the sun, wear protective clothing and use sunscreen. Do not use sun lamps or tanning beds/booths. You may get drowsy or dizzy. Do not drive, use machinery, or do anything that needs mental alertness until you know how this medicine affects you. Do not stand or sit up quickly, especially if you are an older patient. This reduces the risk of dizzy or fainting spells. You may need blood work done while you are taking this medicine. Call your doctor or health care professional for advice if you get a fever, chills or sore throat, or other symptoms of a cold or flu. Do not treat yourself. This drug decreases your body's ability to fight infections. Try to avoid being around people who are sick. This medicine may increase your risk to bruise or bleed. Call your doctor or health care professional if you notice any unusual bleeding. Check with your doctor or health care professional if you get an attack of severe diarrhea, nausea and vomiting, or if you sweat a lot. The loss of too much body fluid can make it dangerous for you to take this medicine. Talk to your doctor about your risk of cancer. You may be more at risk for certain types of cancers if you take this medicine. Both men and women must use effective birth control with this medicine. Do not become pregnant while taking this medicine or until at least 1 normal menstrual cycle has occurred after stopping it. Women should inform their doctor if they wish to become pregnant or think they might be pregnant. Men should not father a child while taking this medicine and for 3 months after stopping it. There is a potential for serious side effects to an unborn child. Talk to your health care professional or pharmacist for more information. Do not  breast-feed an infant while taking this medicine. What side effects may I notice from receiving this medicine? Side effects that you should report to your doctor or health care professional as soon as possible:  allergic reactions like skin rash, itching or hives, swelling of the face, lips, or tongue  back pain  breathing problems or shortness of breath  confusion  diarrhea  dry, nonproductive cough  low blood counts - this medicine may decrease the number of white blood cells, red blood cells and platelets. You may be at increased risk of infections and bleeding  mouth sores  redness, blistering, peeling or loosening of the skin, including inside the mouth  seizures  severe headaches  signs of infection - fever or chills, cough, sore throat, pain or difficulty passing urine  signs and  symptoms of bleeding such as bloody or black, tarry stools; red or dark-brown urine; spitting up blood or brown material that looks like coffee grounds; red spots on the skin; unusual bruising or bleeding from the eye, gums, or nose  signs and symptoms of kidney injury like trouble passing urine or change in the amount of urine  signs and symptoms of liver injury like dark yellow or brown urine; general ill feeling or flu-like symptoms; light-colored stools; loss of appetite; nausea; right upper belly pain; unusually weak or tired; yellowing of the eyes or skin  stiff neck  vomiting Side effects that usually do not require medical attention (report to your doctor or health care professional if they continue or are bothersome):  dizziness  hair loss  headache  stomach pain  upset stomach This list may not describe all possible side effects. Call your doctor for medical advice about side effects. You may report side effects to FDA at 1-800-FDA-1088. Where should I keep my medicine? If you are using this medicine at home, you will be instructed on how to store this medicine. Throw away  any unused medicine after the expiration date on the label. NOTE: This sheet is a summary. It may not cover all possible information. If you have questions about this medicine, talk to your doctor, pharmacist, or health care provider.  2020 Elsevier/Gold Standard (2016-12-13 13:31:42)  Fluorouracil, 5-FU injection What is this medicine? FLUOROURACIL, 5-FU (flure oh YOOR a sil) is a chemotherapy drug. It slows the growth of cancer cells. This medicine is used to treat many types of cancer like breast cancer, colon or rectal cancer, pancreatic cancer, and stomach cancer. This medicine may be used for other purposes; ask your health care provider or pharmacist if you have questions. COMMON BRAND NAME(S): Adrucil What should I tell my health care provider before I take this medicine? They need to know if you have any of these conditions:  blood disorders  dihydropyrimidine dehydrogenase (DPD) deficiency  infection (especially a virus infection such as chickenpox, cold sores, or herpes)  kidney disease  liver disease  malnourished, poor nutrition  recent or ongoing radiation therapy  an unusual or allergic reaction to fluorouracil, other chemotherapy, other medicines, foods, dyes, or preservatives  pregnant or trying to get pregnant  breast-feeding How should I use this medicine? This drug is given as an infusion or injection into a vein. It is administered in a hospital or clinic by a specially trained health care professional. Talk to your pediatrician regarding the use of this medicine in children. Special care may be needed. Overdosage: If you think you have taken too much of this medicine contact a poison control center or emergency room at once. NOTE: This medicine is only for you. Do not share this medicine with others. What if I miss a dose? It is important not to miss your dose. Call your doctor or health care professional if you are unable to keep an appointment. What may  interact with this medicine?  allopurinol  cimetidine  dapsone  digoxin  hydroxyurea  leucovorin  levamisole  medicines for seizures like ethotoin, fosphenytoin, phenytoin  medicines to increase blood counts like filgrastim, pegfilgrastim, sargramostim  medicines that treat or prevent blood clots like warfarin, enoxaparin, and dalteparin  methotrexate  metronidazole  pyrimethamine  some other chemotherapy drugs like busulfan, cisplatin, estramustine, vinblastine  trimethoprim  trimetrexate  vaccines Talk to your doctor or health care professional before taking any of these medicines:  acetaminophen  aspirin  ibuprofen  ketoprofen  naproxen This list may not describe all possible interactions. Give your health care provider a list of all the medicines, herbs, non-prescription drugs, or dietary supplements you use. Also tell them if you smoke, drink alcohol, or use illegal drugs. Some items may interact with your medicine. What should I watch for while using this medicine? Visit your doctor for checks on your progress. This drug may make you feel generally unwell. This is not uncommon, as chemotherapy can affect healthy cells as well as cancer cells. Report any side effects. Continue your course of treatment even though you feel ill unless your doctor tells you to stop. In some cases, you may be given additional medicines to help with side effects. Follow all directions for their use. Call your doctor or health care professional for advice if you get a fever, chills or sore throat, or other symptoms of a cold or flu. Do not treat yourself. This drug decreases your body's ability to fight infections. Try to avoid being around people who are sick. This medicine may increase your risk to bruise or bleed. Call your doctor or health care professional if you notice any unusual bleeding. Be careful brushing and flossing your teeth or using a toothpick because you may get an  infection or bleed more easily. If you have any dental work done, tell your dentist you are receiving this medicine. Avoid taking products that contain aspirin, acetaminophen, ibuprofen, naproxen, or ketoprofen unless instructed by your doctor. These medicines may hide a fever. Do not become pregnant while taking this medicine. Women should inform their doctor if they wish to become pregnant or think they might be pregnant. There is a potential for serious side effects to an unborn child. Talk to your health care professional or pharmacist for more information. Do not breast-feed an infant while taking this medicine. Men should inform their doctor if they wish to father a child. This medicine may lower sperm counts. Do not treat diarrhea with over the counter products. Contact your doctor if you have diarrhea that lasts more than 2 days or if it is severe and watery. This medicine can make you more sensitive to the sun. Keep out of the sun. If you cannot avoid being in the sun, wear protective clothing and use sunscreen. Do not use sun lamps or tanning beds/booths. What side effects may I notice from receiving this medicine? Side effects that you should report to your doctor or health care professional as soon as possible:  allergic reactions like skin rash, itching or hives, swelling of the face, lips, or tongue  low blood counts - this medicine may decrease the number of white blood cells, red blood cells and platelets. You may be at increased risk for infections and bleeding.  signs of infection - fever or chills, cough, sore throat, pain or difficulty passing urine  signs of decreased platelets or bleeding - bruising, pinpoint red spots on the skin, black, tarry stools, blood in the urine  signs of decreased red blood cells - unusually weak or tired, fainting spells, lightheadedness  breathing problems  changes in vision  chest pain  mouth sores  nausea and vomiting  pain, swelling,  redness at site where injected  pain, tingling, numbness in the hands or feet  redness, swelling, or sores on hands or feet  stomach pain  unusual bleeding Side effects that usually do not require medical attention (report to your doctor or health care professional if they  continue or are bothersome):  changes in finger or toe nails  diarrhea  dry or itchy skin  hair loss  headache  loss of appetite  sensitivity of eyes to the light  stomach upset  unusually teary eyes This list may not describe all possible side effects. Call your doctor for medical advice about side effects. You may report side effects to FDA at 1-800-FDA-1088. Where should I keep my medicine? This drug is given in a hospital or clinic and will not be stored at home. NOTE: This sheet is a summary. It may not cover all possible information. If you have questions about this medicine, talk to your doctor, pharmacist, or health care provider.  2020 Elsevier/Gold Standard (2007-08-27 13:53:16)

## 2019-10-15 NOTE — Progress Notes (Signed)
Pt is approved for the $1000 [employer] grant.  

## 2019-10-15 NOTE — Patient Instructions (Signed)

## 2019-10-16 ENCOUNTER — Telehealth: Payer: Self-pay | Admitting: Emergency Medicine

## 2019-10-16 ENCOUNTER — Telehealth: Payer: Self-pay | Admitting: *Deleted

## 2019-10-16 NOTE — Telephone Encounter (Signed)
Received call from pt with complaint of sinus drainage and requesting advice on OTC medication to take.  Per MD pt to take OTC Mucinex DM.  Pt verbalized understanding and appreciative of the advice.

## 2019-10-16 NOTE — Telephone Encounter (Signed)
-----   Message from Jesse Fall, [place] sent at 10/15/2019 11:51 AM EDT ----- Regarding: FW: 1st chemo follow up  ----- Message ----- From: Bo Mcclintock, RN Sent: 10/15/2019  10:43 AM EDT To: Onc Triage Nurse [facility] Subject: 1st chemo follow up                            1st chemo follow up, Dr Lindi Adie. Received [facility].

## 2019-10-16 NOTE — Telephone Encounter (Signed)
Chemo f/u call, spoke with patient.  Pt already spoke with Merleen Nicely RN this am regarding sinus drainage, denies any other issues besides mild fatigue and constipation that is being managed with stool softeners.  Pt aware to f/u as needed with any questions/concerns.

## 2019-10-19 ENCOUNTER — Telehealth: Payer: Self-pay | Admitting: Hematology and Oncology

## 2019-10-19 NOTE — Telephone Encounter (Signed)
Rescheduled 06/21 appointments per 06/09 scheduled message, called patient regarding rescheduled appointments and left a voicemail.

## 2019-10-22 ENCOUNTER — Ambulatory Visit: Payer: Medicare Other | Admitting: Hematology and Oncology

## 2019-10-22 ENCOUNTER — Other Ambulatory Visit: Payer: Medicare Other

## 2019-10-26 ENCOUNTER — Other Ambulatory Visit: Payer: Medicare Other

## 2019-10-26 ENCOUNTER — Ambulatory Visit: Payer: Medicare Other | Admitting: Hematology and Oncology

## 2019-10-26 NOTE — Progress Notes (Signed)
Patient Care Team: Hoyt Koch, MD as PCP - General (Internal Medicine) Mauro Kaufmann, RN as Oncology Nurse Navigator Rockwell Germany, RN as Oncology Nurse Navigator Donnie Mesa, MD as Consulting Physician (General Surgery) Nicholas Lose, MD as Consulting Physician (Hematology and Oncology) Kyung Rudd, MD as Consulting Physician (Radiation Oncology)  DIAGNOSIS:    ICD-10-CM   1. Malignant neoplasm of upper-outer quadrant of left breast in female, estrogen receptor negative ([place])  C50.412    Z17.1     SUMMARY OF ONCOLOGIC HISTORY: Oncology History  Malignant neoplasm of upper-outer quadrant of left breast in female, estrogen receptor negative ([place])  09/08/2019 Initial Diagnosis   Screening mammogram detected a left breast asymmetry. Diagnostic mammogram and US showed a 0.5cm mass, 2 o'clock position, with no axillary adenopathy. Biopsy showed IDC, grade 3, HER-2 - (0), ER/PR -, Ki67 50%.   09/09/2019 Cancer Staging   Staging form: Breast, AJCC 8th Edition - Clinical stage from 09/09/2019: Stage IB (cT1a, cN0, cM0, G3, ER-, PR-, HER2-) - Signed by Nicholas Lose, MD on 09/09/2019   09/16/2019 Surgery   Left lumpectomy (Tsuei): IDC, grade 3, 0.9cm, with high grade DCIS, 4 left axillary lymph nodes negative.    Radiation Therapy     09/16/2019 Cancer Staging   Staging form: Breast, AJCC 8th Edition - Pathologic stage from 09/16/2019: Stage IB (pT1b, pN0, cM0, G3, ER-, PR-, HER2-) - Signed by Gardenia Phlegm, NP on 10/07/2019   10/15/2019 -  Adjuvant Chemotherapy   CMF x 8     CHIEF COMPLIANT: Cycle 1 day 12  INTERVAL HISTORY: Kim Fuller is a 74 y.o. with above-mentioned history of triple negative left breastcancer.She underwent a left lumpectomy and is currently on adjuvant chemotherapy with CMF. She presents to the clinic todayfor cycle 2.  She is tolerating chemotherapy extremely well without any problems or concerns.  She had nausea for couple of days  after chemo but then it resolved after taking nausea medication.  ALLERGIES:  is allergic to sulfonamide derivatives and tramadol hcl.  MEDICATIONS:  Current Outpatient Medications  Medication Sig Dispense Refill  . acetaminophen (TYLENOL) 500 MG tablet Take 500-1,000 mg by mouth every 6 (six) hours as needed (for pain.).    Marland Kitchen aspirin 81 MG chewable tablet Chew 81 mg by mouth at bedtime.     . benazepril (LOTENSIN) 40 MG tablet Take 0.5 tablets (20 mg total) by mouth daily. 45 tablet 3  . bisoprolol-hydrochlorothiazide (ZIAC) 5-6.25 MG tablet Take 1 tablet by mouth daily. 90 tablet 3  . calcium carbonate (OSCAL) 1500 (600 Ca) MG TABS tablet Take 600 mg of elemental calcium by mouth 2 (two) times daily with a meal.    . cephALEXin (KEFLEX) 500 MG capsule Take 1 capsule (500 mg total) by mouth 2 (two) times daily. 14 capsule 0  . cholecalciferol (VITAMIN D3) 10 MCG (400 UNIT) TABS tablet Take 400 Units by mouth at bedtime.     Marland Kitchen esomeprazole (NEXIUM) 20 MG capsule Take 20 mg by mouth daily as needed (acid reflux/indigestion.).    Marland Kitchen fluticasone (FLONASE) 50 MCG/ACT nasal spray USE TWO SPRAY(S) IN EACH NOSTRIL DAILY (Patient taking differently: Place 1-2 sprays into both nostrils daily as needed (allergies.). ) 48 g 6  . ibuprofen (ADVIL) 200 MG tablet Take 400 mg by mouth every 8 (eight) hours as needed (pain.).     Marland Kitchen lidocaine-prilocaine (EMLA) cream Apply to affected area once 30 g 3  . Multiple Vitamin (MULTIVITAMIN WITH  MINERALS) TABS tablet Take 1 tablet by mouth daily. Centrum Silver    . Omega-3 Fatty Acids (FISH OIL) 1000 MG CAPS Take 1,000 mg by mouth 2 (two) times daily.    . ondansetron (ZOFRAN) 8 MG tablet Take 1 tablet (8 mg total) by mouth 2 (two) times daily as needed for refractory nausea / vomiting. Start on day 3 after chemotherapy. 30 tablet 1  . pravastatin (PRAVACHOL) 20 MG tablet Take 1 tablet (20 mg total) by mouth daily. (Patient taking differently: Take 20 mg by mouth every  evening. ) 90 tablet 3  . prochlorperazine (COMPAZINE) 10 MG tablet Take 1 tablet (10 mg total) by mouth every 6 (six) hours as needed (Nausea or vomiting). 30 tablet 1  . Propylene Glycol (SYSTANE BALANCE) 0.6 % SOLN Place 1 drop 3 (three) times daily as needed into both eyes (dry eyes).    . vitamin E 400 UNIT capsule Take 400 Units by mouth daily.     No current facility-administered medications for this visit.    PHYSICAL EXAMINATION: ECOG PERFORMANCE STATUS: 1 - Symptomatic but completely ambulatory  Vitals:   10/27/19 0957  BP: (!) 132/56  Pulse: 61  Resp: 18  Temp: 98.2 F (36.8 C)  SpO2: 95%   Filed Weights   10/27/19 0957  Weight: 212 lb 11.2 oz (96.5 kg)      LABORATORY DATA:  I have reviewed the data as listed CMP Latest Ref Rng & Units 10/15/2019 09/15/2019 09/09/2019  Glucose 70 - 99 mg/dL 99 99 89  BUN 8 - 23 mg/dL _0 Creatinine 0.44 - 1.00 mg/dL 0.78 0.79 0.79  Sodium 135 - 145 mmol/L 140 141 144  Potassium 3.5 - 5.1 mmol/L 3.6 5.3(H) 3.8  Chloride 98 - 111 mmol/L 105 104 102  CO2 22 - 32 mmol/L 27 29 33(H)  Calcium 8.9 - 10.3 mg/dL 9.0 9.3 10.4(H)  Total Protein 6.5 - 8.1 g/dL 6.9 - 7.5  Total Bilirubin 0.3 - 1.2 mg/dL 0.5 - 0.6  Alkaline Phos 38 - 126 U/L 55 - 53  AST 15 - 41 U/L 17 - 18  ALT 0 - 44 U/L 18 - 17    Lab Results  Component Value Date   WBC 5.6 10/15/2019   HGB 12.7 10/15/2019   HCT 38.8 10/15/2019   MCV 99.5 10/15/2019   PLT 167 10/15/2019   NEUTROABS 2.9 10/15/2019    ASSESSMENT & PLAN:  Malignant neoplasm of upper-outer quadrant of left breast in female, estrogen receptor negative ([place]) 09/16/2018:Left lumpectomy (Tsuei): IDC, grade 3, 0.9cm, with high grade DCIS, 4 left axillary lymph nodes negative.Triple negative with a Ki-67 of 50%  Treatment plan: 1.Adjuvant chemotherapy with CMF 2.Adjuvant radiation  therapy ------------------------------------------------------------------------------------------------------------------------------------------------------------ Current Treatment: Cycle 1 day 12 Labs reviewed  Breast erythema:  Resolved  Chemo toxicities: Mild nausea for couple of days after chemo which resolved with nausea medication. No other adverse effect.  Energy levels are excellent. RTC  for cycle 2    No orders of the defined types were placed in this encounter.  The patient has a good understanding of the overall plan. she agrees with it. she will call with any problems that may develop before the next visit here.  Total time spent: 30 mins including face to face time and time spent for planning, charting and coordination of care  Nicholas Lose, MD 10/27/2019  I, Cloyde Reams Dorshimer, am acting as scribe for Dr. Nicholas Lose.  I have reviewed the above  documentation for accuracy and completeness, and I agree with the above.

## 2019-10-27 ENCOUNTER — Encounter: Payer: Self-pay | Admitting: *Deleted

## 2019-10-27 ENCOUNTER — Inpatient Hospital Stay: Payer: Medicare Other

## 2019-10-27 ENCOUNTER — Other Ambulatory Visit: Payer: Self-pay

## 2019-10-27 ENCOUNTER — Inpatient Hospital Stay (HOSPITAL_BASED_OUTPATIENT_CLINIC_OR_DEPARTMENT_OTHER): Payer: Medicare Other | Admitting: Hematology and Oncology

## 2019-10-27 DIAGNOSIS — C50412 Malignant neoplasm of upper-outer quadrant of left female breast: Secondary | ICD-10-CM

## 2019-10-27 DIAGNOSIS — Z95828 Presence of other vascular implants and grafts: Secondary | ICD-10-CM | POA: Insufficient documentation

## 2019-10-27 DIAGNOSIS — Z171 Estrogen receptor negative status [ER-]: Secondary | ICD-10-CM

## 2019-10-27 DIAGNOSIS — Z5111 Encounter for antineoplastic chemotherapy: Secondary | ICD-10-CM | POA: Diagnosis not present

## 2019-10-27 LAB — CMP (CANCER CENTER ONLY)
ALT: 20 U/L (ref 0–44)
AST: 20 U/L (ref 15–41)
Albumin: 3.5 g/dL (ref 3.5–5.0)
Alkaline Phosphatase: 57 U/L (ref 38–126)
Anion gap: 7 (ref 5–15)
BUN: 10 mg/dL (ref 8–23)
CO2: 29 mmol/L (ref 22–32)
Calcium: 9.3 mg/dL (ref 8.9–10.3)
Chloride: 104 mmol/L (ref 98–111)
Creatinine: 0.82 mg/dL (ref 0.44–1.00)
GFR, Est AFR Am: >60 mL/min (ref >60)
GFR, Estimated: >60 mL/min (ref >60)
Glucose, Bld: 123 mg/dL — ABNORMAL HIGH (ref 70–99)
Potassium: 3.8 mmol/L (ref 3.5–5.1)
Sodium: 140 mmol/L (ref 135–145)
Total Bilirubin: 0.5 mg/dL (ref 0.3–1.2)
Total Protein: 6.5 g/dL (ref 6.5–8.1)

## 2019-10-27 LAB — CBC WITH DIFFERENTIAL (CANCER CENTER ONLY)
Abs Immature Granulocytes: 0 10*3/uL (ref 0.00–0.07)
Basophils Absolute: 0 10*3/uL (ref 0.0–0.1)
Basophils Relative: 1 %
Eosinophils Absolute: 0.1 10*3/uL (ref 0.0–0.5)
Eosinophils Relative: 5 %
HCT: 38.3 % (ref 36.0–46.0)
Hemoglobin: 12.4 g/dL (ref 12.0–15.0)
Immature Granulocytes: 0 %
Lymphocytes Relative: 38 %
Lymphs Abs: 1.1 10*3/uL (ref 0.7–4.0)
MCH: 32.6 pg (ref 26.0–34.0)
MCHC: 32.4 g/dL (ref 30.0–36.0)
MCV: 100.8 fL — ABNORMAL HIGH (ref 80.0–100.0)
Monocytes Absolute: 0.3 10*3/uL (ref 0.1–1.0)
Monocytes Relative: 10 %
Neutro Abs: 1.4 10*3/uL — ABNORMAL LOW (ref 1.7–7.7)
Neutrophils Relative %: 46 %
Platelet Count: 160 10*3/uL (ref 150–400)
RBC: 3.8 MIL/uL — ABNORMAL LOW (ref 3.87–5.11)
RDW: 12.8 % (ref 11.5–15.5)
WBC Count: 2.9 10*3/uL — ABNORMAL LOW (ref 4.0–10.5)
nRBC: 0 % (ref 0.0–0.2)

## 2019-10-27 MED ORDER — HEPARIN SOD (PORK) LOCK FLUSH 100 UNIT/ML IV SOLN
500.0000 [IU] | Freq: Once | INTRAVENOUS | Status: AC
  Administered 2019-10-27: 500 [IU]
  Filled 2019-10-27: qty 5

## 2019-10-27 MED ORDER — SODIUM CHLORIDE 0.9% FLUSH
10.0000 mL | Freq: Once | INTRAVENOUS | Status: AC
  Administered 2019-10-27: 10 mL
  Filled 2019-10-27: qty 10

## 2019-10-27 NOTE — Assessment & Plan Note (Signed)
09/16/2018:Left lumpectomy (Tsuei): IDC, grade 3, 0.9cm, with high grade DCIS, 4 left axillary lymph nodes negative.Triple negative with a Ki-67 of 50%  Treatment plan: 1.Adjuvant chemotherapy with CMF 2.Adjuvant radiation therapy ------------------------------------------------------------------------------------------------------------------------------------------------------------ Current Treatment: Cycle 2 CMF Labs reviewed  Breast erythema: There is no fever and it is not any warmer to touch than the opposite side.  Therefore I started her on Keflex 100 twice daily for 7 days.  Chemo toxicities:  RTC in 3 weeks for cycle 3

## 2019-10-29 ENCOUNTER — Telehealth: Payer: Self-pay | Admitting: Hematology and Oncology

## 2019-10-29 NOTE — Telephone Encounter (Signed)
No 6/22 los, no changes made to patient schedule

## 2019-10-30 NOTE — Progress Notes (Signed)
Pharmacist Chemotherapy Monitoring - Follow Up Assessment    I verify that I have reviewed each item in the below checklist:  . Regimen for the patient is scheduled for the appropriate day and plan matches scheduled date. Marland Kitchen Appropriate non-routine labs are ordered dependent on drug ordered. . If applicable, additional medications reviewed and ordered per protocol based on lifetime cumulative doses and/or treatment regimen.   Plan for follow-up and/or issues identified: No . I-vent associated with next due treatment: No . MD and/or nursing notified: No  Kim Fuller 10/30/2019 11:19 AM

## 2019-11-02 ENCOUNTER — Other Ambulatory Visit: Payer: Self-pay

## 2019-11-02 ENCOUNTER — Ambulatory Visit: Payer: Medicare Other | Attending: Surgery | Admitting: Physical Therapy

## 2019-11-02 ENCOUNTER — Encounter: Payer: Self-pay | Admitting: Physical Therapy

## 2019-11-02 DIAGNOSIS — R293 Abnormal posture: Secondary | ICD-10-CM | POA: Diagnosis present

## 2019-11-02 DIAGNOSIS — C50412 Malignant neoplasm of upper-outer quadrant of left female breast: Secondary | ICD-10-CM | POA: Diagnosis present

## 2019-11-02 DIAGNOSIS — Z171 Estrogen receptor negative status [ER-]: Secondary | ICD-10-CM | POA: Insufficient documentation

## 2019-11-02 DIAGNOSIS — Z483 Aftercare following surgery for neoplasm: Secondary | ICD-10-CM | POA: Insufficient documentation

## 2019-11-02 DIAGNOSIS — M25612 Stiffness of left shoulder, not elsewhere classified: Secondary | ICD-10-CM | POA: Diagnosis present

## 2019-11-02 NOTE — Patient Instructions (Addendum)
Closed Chain: Shoulder Flexion / Extension - on Wall    Hands on wall, step backward. Return. Stepping causes shoulder flexion and extension Do _5__ times, holding 5 seconds, _2__ times per day.  http://ss.exer.us/265   Copyright  VHI. All rights reserved.               [facility]         [address], [place] [address]         [phone]         Annia Friendly, PT, CLT   After Breast Cancer Class It is recommended you attend the ABC class to be educated on lymphedema risk reduction. This class is free of charge and lasts for 1 hour. It is a 1-time class. You are scheduled for July 19th at 11:00.   Scar massage Begin gentle scar massage to your incisions using coconut oil or vitamin E cream.   Home exercise Program Begin doing the new exercise I gave you on the wall to get the last few degrees of flexion (reaching overhead).  Follow up PT: It is recommended you return every 3 months for the first 3 years following surgery to be assessed on the SOZO machine for an L-Dex score. This helps prevent clinically significant lymphedema in 95% of patients. These follow up screens are 15 minute appointments that you are not billed for.

## 2019-11-02 NOTE — Therapy (Signed)
[place], [place], [address] Phone: [phone]   Fax:  [phone]  Physical Therapy Treatment  Patient Details  Name: Kim Fuller MRN: [id number] Date of Birth: 02/01/1946 Referring Provider (PT): Dr. Donnie Mesa   Encounter Date: 11/02/2019   PT End of Session - 11/02/19 1114    Visit Number 2    Number of Visits 2    PT Start Time 1694    PT Stop Time 1120    PT Time Calculation (min) 40 min    Activity Tolerance Patient tolerated treatment well    Behavior During Therapy [facility] for tasks assessed/performed           Past Medical History:  Diagnosis Date  . Abnormal finding on Pap smear    x1  . Diverticulosis   . GERD (gastroesophageal reflux disease)   . Hyperlipidemia   . Hypertension   . Osteopenia    -1.5 @ femoral neck 11/2007  . Reactive airway disease    years ago ; severe asthma attack "bornchitis asthma " per patient, no issues since     Past Surgical History:  Procedure Laterality Date  . BREAST LUMPECTOMY WITH RADIOACTIVE SEED AND SENTINEL LYMPH NODE BIOPSY Left 09/16/2019   Procedure: LEFT BREAST LUMPECTOMY WITH RADIOACTIVE SEED AND SENTINEL LYMPH NODE BIOPSY;  Surgeon: Donnie Mesa, MD;  Location: [place];  Service: General;  Laterality: Left;  . CARDIOVASCULAR STRESS TEST  03/05/06  . CARPAL TUNNEL RELEASE  2009    bilaterally  . CATARACT EXTRACTION, BILATERAL     Dr Gershon Crane  . COLONOSCOPY W/ POLYPECTOMY  1998   Tics @ 2004 & 2012; Dr Carlean Purl  . HAND SURGERY     Trigger thumb  . PARTIAL KNEE ARTHROPLASTY Left 04/03/2017   Procedure: Left knee medial unicompartmental arthroplasty;  Surgeon: Gaynelle Arabian, MD;  Location: [facility];  Service: Orthopedics;  Laterality: Left;  Adductor canal block  . PORTACATH PLACEMENT N/A 09/29/2019   Procedure: INSERTION PORT-A-CATH;  Surgeon: Donnie Mesa, MD;  Location: [facility];  Service: General;   Laterality: N/A;  . TRIGGER FINGER RELEASE Right   . TUBAL LIGATION      There were no vitals filed for this visit.   Subjective Assessment - 11/02/19 1048    Subjective Patient reports she underwent a left lumpectomy and sentinel node biopsy (4 negative nodes) on 09/16/2019. She is triple negative so her first chemotherapy treatment was 10/15/2019 and she will undergo 5 more chemo treatments followed by radiation.    Pertinent History Patient was diagnosed on 08/19/2019 with left triple negative invasive ductal carcinoma breast cancer. It is grade III with a Ki67 of 50%. Patient reports she underwent a left lumpectomy and sentinel node biopsy (4 negative nodes) on 09/16/2019. She had a partial left knee replacement in 2018 and some chronic low back pain.    Patient Stated Goals See how my arm is doing and see if my foot could feel better.    Currently in Pain? No/denies              [facility] PT Assessment - 11/02/19 0001      Assessment   Medical Diagnosis s/p left lumpectomy and SLNB    Referring Provider (PT) Dr. Donnie Mesa    Onset Date/Surgical Date 09/16/19    Hand Dominance Right    Prior Therapy Baselines      Precautions   Precautions Other (comment)    Precaution  Comments recent surgery; left arm lymphedema risk      Restrictions   Weight Bearing Restrictions No      Balance Screen   Has the patient fallen in the past 6 months No    Has the patient had a decrease in activity level because of a fear of falling?  No    Is the patient reluctant to leave their home because of a fear of falling?  No      Home Environment   Living Environment Private residence    Living Arrangements Spouse/significant other    Available Help at Discharge Family      Prior Function   Level of Freedom Retired    Leisure Not walking due to right foot pain      Cognition   Overall Cognitive Status Within Functional Limits for tasks assessed       Observation/Other Assessments   Observations Breast and axillary incisions both appear to be well healed. No sign of edema or redness. Mild hard area just inferior to breast incision which is likely scar tissue.      Posture/Postural Control   Posture/Postural Control Postural limitations    Postural Limitations Rounded Shoulders;Forward head      ROM / Strength   AROM / PROM / Strength AROM      AROM   AROM Assessment Site Shoulder    Right/Left Shoulder Left    Left Shoulder Extension 45 Degrees    Left Shoulder Flexion 137 Degrees    Left Shoulder ABduction 146 Degrees    Left Shoulder Internal Rotation 78 Degrees    Left Shoulder External Rotation 76 Degrees      Strength   Overall Strength Within functional limits for tasks performed             LYMPHEDEMA/ONCOLOGY QUESTIONNAIRE - 11/02/19 0001      Type   Cancer Type Left breast cancer      Surgeries   Lumpectomy Date 09/16/19    Sentinel Lymph Node Biopsy Date 09/16/19    Number Lymph Nodes Removed 4      Treatment   Active Chemotherapy Treatment Yes    Date 10/15/19    Past Chemotherapy Treatment No    Active Radiation Treatment No    Past Radiation Treatment No    Current Hormone Treatment No    Past Hormone Therapy No      What other symptoms do you have   Are you Having Heaviness or Tightness Yes    Are you having Pain No    Are you having pitting edema No    Is it Hard or Difficult finding clothes that fit No    Do you have infections Yes    Comments Infection at surgical site; treated with antibiotics    Is there Decreased scar mobility No    Stemmer Sign No      Lymphedema Assessments   Lymphedema Assessments Upper extremities      Right Upper Extremity Lymphedema   10 cm Proximal to Olecranon Process 31.2 cm    Olecranon Process 26 cm    10 cm Proximal to Ulnar Styloid Process 23 cm    Just Proximal to Ulnar Styloid Process 16.1 cm    Across Hand at [employer] 19.3 cm    At [place] of  2nd Digit 6.5 cm      Left Upper Extremity Lymphedema   10 cm Proximal to Olecranon Process 34.5  cm    Olecranon Process 27.2 cm    10 cm Proximal to Ulnar Styloid Process 24.2 cm    Just Proximal to Ulnar Styloid Process 16.4 cm    Across Hand at [employer] 18.9 cm    At [place] of 2nd Digit 6.5 cm              Quick Dash - 11/02/19 0001    Open a tight or new jar Moderate difficulty    Do heavy household chores (wash walls, wash floors) Moderate difficulty    Carry a shopping bag or briefcase Moderate difficulty    Wash your back Moderate difficulty    Use a knife to cut food No difficulty    Recreational activities in which you take some force or impact through your arm, shoulder, or hand (golf, hammering, tennis) Moderate difficulty    During the past week, to what extent has your arm, shoulder or hand problem interfered with your normal social activities with family, friends, neighbors, or groups? Slightly    During the past week, to what extent has your arm, shoulder or hand problem limited your work or other regular daily activities Slightly    Arm, shoulder, or hand pain. None    Tingling (pins and needles) in your arm, shoulder, or hand None    Difficulty Sleeping No difficulty    DASH Score 27.27 %                          PT Education - 11/02/19 1113    Education Details Scar massage; closed chain HEP flexion    Person(s) Educated Patient    Methods Explanation;Demonstration;Handout    Comprehension Returned demonstration;Verbalized understanding               PT Long Term Goals - 11/02/19 1128      PT LONG TERM GOAL #1   Title Patient will demonstrate she has regained full shoulder ROM and function post operatively compared to baselines.    Time 8    Period Weeks    Status Partially Met                 Plan - 11/02/19 1122    Clinical Impression Statement Patient is doing very well s/p left lumpectomy and SLNB on 09/16/2019.  She has regained nearly full ROM (lacking 7 degrees of left shoulder flexion), shows no signs of early lymphedema, and is working to regain normal function. Her incisions appear to be well healed and she was educated today on scar massage. She plans to attend the After Breast Cancer class on 11/23/2019 for education on lymphedema risk reduction. Otherwise she has no current PT needs but will return for L-Dex screenings every 3 months to detect subclinical lymphedema.    PT Treatment/Interventions ADLs/Self Care Home Management;Therapeutic exercise;Patient/family education    PT Next Visit Plan D/C    PT Home Exercise Plan Post op shoulder ROM HEP and closed chain shoulder flexion    Consulted and Agree with Plan of Care Patient           Patient will benefit from skilled therapeutic intervention in order to improve the following deficits and impairments:  Postural dysfunction, Decreased range of motion, Decreased knowledge of precautions, Impaired UE functional use, Pain  Visit Diagnosis: Malignant neoplasm of upper-outer quadrant of left breast in female, estrogen receptor negative ([facility])  Abnormal posture  Aftercare following surgery for neoplasm  Stiffness  of left shoulder, not elsewhere classified     Problem List Patient Active Problem List   Diagnosis Date Noted  . Port-A-Cath in place 10/27/2019  . Malignant neoplasm of upper-outer quadrant of left breast in female, estrogen receptor negative ([place]) 09/08/2019  . Acute left-sided low back pain without sciatica 07/06/2019  . Morbid obesity ([place]) 04/24/2019  . Trigger finger, right middle finger 07/02/2017  . Chronic shoulder bursitis, left 11/19/2016  . OA (osteoarthritis) of knee 11/18/2015  . Routine general medical examination at a health care facility 02/20/2015  . GERD 12/31/2007  . Osteopenia 10/31/2007  . HYPERLIPIDEMIA 03/20/2007  . Essential hypertension 03/20/2007   PHYSICAL THERAPY DISCHARGE SUMMARY  Visits  from Start of Care: 2  Current functional level related to goals / functional outcomes: See above for objective findings.   Remaining deficits: Mild shoulder flexion ROM limitation   Education / Equipment: Lymphedema risk reduction and post op shoulder ROM HEP  Plan: Patient agrees to discharge.  Patient goals were partially met. Patient is being discharged due to being pleased with the current functional level.  ?????         Annia Friendly, [place] 11/02/19 11:30 AM  [place], [place], [address] Phone: [phone]   Fax:  [phone]  Name: Kim Fuller MRN: [id number] Date of Birth: [birth year]

## 2019-11-05 ENCOUNTER — Other Ambulatory Visit: Payer: Self-pay

## 2019-11-05 ENCOUNTER — Inpatient Hospital Stay: Payer: Medicare Other | Attending: Hematology and Oncology

## 2019-11-05 ENCOUNTER — Inpatient Hospital Stay: Payer: Medicare Other

## 2019-11-05 ENCOUNTER — Inpatient Hospital Stay (HOSPITAL_BASED_OUTPATIENT_CLINIC_OR_DEPARTMENT_OTHER): Payer: Medicare Other | Admitting: Adult Health

## 2019-11-05 ENCOUNTER — Encounter: Payer: Self-pay | Admitting: Adult Health

## 2019-11-05 DIAGNOSIS — Z95828 Presence of other vascular implants and grafts: Secondary | ICD-10-CM

## 2019-11-05 DIAGNOSIS — Z171 Estrogen receptor negative status [ER-]: Secondary | ICD-10-CM | POA: Insufficient documentation

## 2019-11-05 DIAGNOSIS — Z5111 Encounter for antineoplastic chemotherapy: Secondary | ICD-10-CM | POA: Diagnosis not present

## 2019-11-05 DIAGNOSIS — C50412 Malignant neoplasm of upper-outer quadrant of left female breast: Secondary | ICD-10-CM

## 2019-11-05 LAB — CBC WITH DIFFERENTIAL (CANCER CENTER ONLY)
Abs Immature Granulocytes: 0.06 10*3/uL (ref 0.00–0.07)
Basophils Absolute: 0.1 10*3/uL (ref 0.0–0.1)
Basophils Relative: 1 %
Eosinophils Absolute: 0.1 10*3/uL (ref 0.0–0.5)
Eosinophils Relative: 2 %
HCT: 40.3 % (ref 36.0–46.0)
Hemoglobin: 12.9 g/dL (ref 12.0–15.0)
Immature Granulocytes: 1 %
Lymphocytes Relative: 38 %
Lymphs Abs: 1.8 10*3/uL (ref 0.7–4.0)
MCH: 32.3 pg (ref 26.0–34.0)
MCHC: 32 g/dL (ref 30.0–36.0)
MCV: 100.8 fL — ABNORMAL HIGH (ref 80.0–100.0)
Monocytes Absolute: 1 10*3/uL (ref 0.1–1.0)
Monocytes Relative: 22 %
Neutro Abs: 1.7 10*3/uL (ref 1.7–7.7)
Neutrophils Relative %: 36 %
Platelet Count: 226 10*3/uL (ref 150–400)
RBC: 4 MIL/uL (ref 3.87–5.11)
RDW: 12.9 % (ref 11.5–15.5)
WBC Count: 4.7 10*3/uL (ref 4.0–10.5)
nRBC: 0 % (ref 0.0–0.2)

## 2019-11-05 LAB — CMP (CANCER CENTER ONLY)
ALT: 19 U/L (ref 0–44)
AST: 20 U/L (ref 15–41)
Albumin: 3.7 g/dL (ref 3.5–5.0)
Alkaline Phosphatase: 64 U/L (ref 38–126)
Anion gap: 11 (ref 5–15)
BUN: 9 mg/dL (ref 8–23)
CO2: 29 mmol/L (ref 22–32)
Calcium: 10.1 mg/dL (ref 8.9–10.3)
Chloride: 102 mmol/L (ref 98–111)
Creatinine: 0.78 mg/dL (ref 0.44–1.00)
GFR, Est AFR Am: >60 mL/min (ref >60)
GFR, Estimated: >60 mL/min (ref >60)
Glucose, Bld: 88 mg/dL (ref 70–99)
Potassium: 3.9 mmol/L (ref 3.5–5.1)
Sodium: 142 mmol/L (ref 135–145)
Total Bilirubin: 0.5 mg/dL (ref 0.3–1.2)
Total Protein: 7.3 g/dL (ref 6.5–8.1)

## 2019-11-05 MED ORDER — FLUOROURACIL CHEMO INJECTION 2.5 GM/50ML
500.0000 mg/m2 | Freq: Once | INTRAVENOUS | Status: AC
  Administered 2019-11-05: 1050 mg via INTRAVENOUS
  Filled 2019-11-05: qty 21

## 2019-11-05 MED ORDER — SODIUM CHLORIDE 0.9% FLUSH
10.0000 mL | Freq: Once | INTRAVENOUS | Status: AC
  Administered 2019-11-05: 10 mL
  Filled 2019-11-05: qty 10

## 2019-11-05 MED ORDER — SODIUM CHLORIDE 0.9 % IV SOLN
Freq: Once | INTRAVENOUS | Status: AC
  Filled 2019-11-05: qty 250

## 2019-11-05 MED ORDER — SODIUM CHLORIDE 0.9% FLUSH
10.0000 mL | INTRAVENOUS | Status: DC | PRN
  Administered 2019-11-05: 10 mL
  Filled 2019-11-05: qty 10

## 2019-11-05 MED ORDER — SODIUM CHLORIDE 0.9 % IV SOLN
500.0000 mg/m2 | Freq: Once | INTRAVENOUS | Status: AC
  Administered 2019-11-05: 1040 mg via INTRAVENOUS
  Filled 2019-11-05: qty 52

## 2019-11-05 MED ORDER — HEPARIN SOD (PORK) LOCK FLUSH 100 UNIT/ML IV SOLN
500.0000 [IU] | Freq: Once | INTRAVENOUS | Status: AC | PRN
  Administered 2019-11-05: 500 [IU]
  Filled 2019-11-05: qty 5

## 2019-11-05 MED ORDER — METHOTREXATE SODIUM (PF) CHEMO INJECTION 250 MG/10ML
30.0000 mg/m2 | Freq: Once | INTRAMUSCULAR | Status: AC
  Administered 2019-11-05: 62 mg via INTRAVENOUS
  Filled 2019-11-05: qty 2.48

## 2019-11-05 MED ORDER — SODIUM CHLORIDE 0.9 % IV SOLN
10.0000 mg | Freq: Once | INTRAVENOUS | Status: AC
  Administered 2019-11-05: 10 mg via INTRAVENOUS
  Filled 2019-11-05: qty 10

## 2019-11-05 MED ORDER — PALONOSETRON HCL INJECTION 0.25 MG/5ML
0.2500 mg | Freq: Once | INTRAVENOUS | Status: AC
  Administered 2019-11-05: 0.25 mg via INTRAVENOUS

## 2019-11-05 MED ORDER — PALONOSETRON HCL INJECTION 0.25 MG/5ML
INTRAVENOUS | Status: AC
  Filled 2019-11-05: qty 5

## 2019-11-05 NOTE — Assessment & Plan Note (Addendum)
09/16/2018:Left lumpectomy (Tsuei): IDC, grade 3, 0.9cm, with high grade DCIS, 4 left axillary lymph nodes negative.Triple negative with a Ki-67 of 50%  Treatment plan: 1.Adjuvant chemotherapy with CMF 2.Adjuvant radiation therapy ------------------------------------------------------------------------------------------------------------------------------------------------------------ Current Treatment: Cycle 2 CMF Labs reviewed  I reviewed her labs with her in detail.  She will proceed with treatment today and will return in 3 weeks for her next cycle.  She has had significant constipation. I recommended stool softener, and miralax.    I recommended she continue with healthy diet and exercise.

## 2019-11-05 NOTE — Progress Notes (Signed)
[facility] Follow up:    Kim Koch, MD [place] [address]   DIAGNOSIS: Cancer Staging Malignant neoplasm of upper-outer quadrant of left breast in female, estrogen receptor negative ([place]) Staging form: Breast, AJCC 8th Edition - Clinical stage from 09/09/2019: Stage IB (cT1a, cN0, cM0, G3, ER-, PR-, HER2-) - Signed by Nicholas Lose, MD on 09/09/2019 - Pathologic stage from 09/16/2019: Stage IB (pT1b, pN0, cM0, G3, ER-, PR-, HER2-) - Signed by Gardenia Phlegm, NP on 10/07/2019   SUMMARY OF ONCOLOGIC HISTORY: Oncology History  Malignant neoplasm of upper-outer quadrant of left breast in female, estrogen receptor negative ([place])  09/08/2019 Initial Diagnosis   Screening mammogram detected a left breast asymmetry. Diagnostic mammogram and US showed a 0.5cm mass, 2 o'clock position, with no axillary adenopathy. Biopsy showed IDC, grade 3, HER-2 - (0), ER/PR -, Ki67 50%.   09/09/2019 Cancer Staging   Staging form: Breast, AJCC 8th Edition - Clinical stage from 09/09/2019: Stage IB (cT1a, cN0, cM0, G3, ER-, PR-, HER2-) - Signed by Nicholas Lose, MD on 09/09/2019   09/16/2019 Surgery   Left lumpectomy (Tsuei): IDC, grade 3, 0.9cm, with high grade DCIS, 4 left axillary lymph nodes negative.    Radiation Therapy     09/16/2019 Cancer Staging   Staging form: Breast, AJCC 8th Edition - Pathologic stage from 09/16/2019: Stage IB (pT1b, pN0, cM0, G3, ER-, PR-, HER2-) - Signed by Gardenia Phlegm, NP on 10/07/2019   10/15/2019 -  Adjuvant Chemotherapy   CMF x 8     CURRENT THERAPY: CMF  INTERVAL HISTORY: Kim Fuller 74 y.o. female returns for evaluation prior to receiving her second cycle of adjuvant chemotherapy with CMF.  She is doing well with chemo.  The main difficulty that she has had has been with increased constipation.  She has a stool softener she is taking daily.  She has also seen PT and was recommended to get coconut oil  for her breast, which she is planning on buying.  She says her activity level is good.  She had a sore foot, and with stretching exercises it has improved.  Kim Fuller is retired and is living with her husband who is also retired.  He works some at the [employer] home.    Patient Active Problem List   Diagnosis Date Noted  . Port-A-Cath in place 10/27/2019  . Malignant neoplasm of upper-outer quadrant of left breast in female, estrogen receptor negative ([place]) 09/08/2019  . Acute left-sided low back pain without sciatica 07/06/2019  . Morbid obesity ([place]) 04/24/2019  . Trigger finger, right middle finger 07/02/2017  . Chronic shoulder bursitis, left 11/19/2016  . OA (osteoarthritis) of knee 11/18/2015  . Routine general medical examination at a health care facility 02/20/2015  . GERD 12/31/2007  . Osteopenia 10/31/2007  . HYPERLIPIDEMIA 03/20/2007  . Essential hypertension 03/20/2007    is allergic to sulfonamide derivatives and tramadol hcl.  MEDICAL HISTORY: Past Medical History:  Diagnosis Date  . Abnormal finding on Pap smear    x1  . Diverticulosis   . GERD (gastroesophageal reflux disease)   . Hyperlipidemia   . Hypertension   . Osteopenia    -1.5 @ femoral neck 11/2007  . Reactive airway disease    years ago ; severe asthma attack "bornchitis asthma " per patient, no issues since     SURGICAL HISTORY: Past Surgical History:  Procedure Laterality Date  . BREAST LUMPECTOMY WITH RADIOACTIVE SEED AND  SENTINEL LYMPH NODE BIOPSY Left 09/16/2019   Procedure: LEFT BREAST LUMPECTOMY WITH RADIOACTIVE SEED AND SENTINEL LYMPH NODE BIOPSY;  Surgeon: Donnie Mesa, MD;  Location: [facility];  Service: General;  Laterality: Left;  . CARDIOVASCULAR STRESS TEST  03/05/06  . CARPAL TUNNEL RELEASE  2009    bilaterally  . CATARACT EXTRACTION, BILATERAL     Dr Gershon Crane  . COLONOSCOPY W/ POLYPECTOMY  1998   Tics @ 2004 & 2012; Dr Carlean Purl  . HAND SURGERY     Trigger thumb   . PARTIAL KNEE ARTHROPLASTY Left 04/03/2017   Procedure: Left knee medial unicompartmental arthroplasty;  Surgeon: Gaynelle Arabian, MD;  Location: [facility];  Service: Orthopedics;  Laterality: Left;  Adductor canal block  . PORTACATH PLACEMENT N/A 09/29/2019   Procedure: INSERTION PORT-A-CATH;  Surgeon: Donnie Mesa, MD;  Location: [facility];  Service: General;  Laterality: N/A;  . TRIGGER FINGER RELEASE Right   . TUBAL LIGATION      SOCIAL HISTORY: Social History   Socioeconomic History  . Marital status: Married    Spouse name: Not on file  . Number of children: 2  . Years of education: Not on file  . Highest education level: Not on file  Occupational History  . Occupation: retired  Tobacco Use  . Smoking status: Former Smoker    Quit date: 05/07/1970    Years since quitting: 49.5  . Smokeless tobacco: Never Used  . Tobacco comment: Smoked as a teen  1962-1972,only up to 3 cigarettes/ day  Vaping Use  . Vaping Use: Never used  Substance and Sexual Activity  . Alcohol use: Yes    Comment: occas  . Drug use: No  . Sexual activity: Not Currently  Other Topics Concern  . Not on file  Social History Narrative  . Not on file   Social Determinants of Health   Financial Resource Strain:   . Difficulty of Paying Living Expenses:   Food Insecurity:   . Worried About Charity fundraiser in the Last Year:   . Arboriculturist in the Last Year:   Transportation Needs:   . Film/video editor (Medical):   Marland Kitchen Lack of Transportation (Non-Medical):   Physical Activity: Inactive  . Days of Exercise per Week: 0 days  . Minutes of Exercise per Session: 0 min  Stress:   . Feeling of Stress :   Social Connections:   . Frequency of Communication with Friends and Family:   . Frequency of Social Gatherings with Friends and Family:   . Attends Religious Services:   . Active Member of Clubs or Organizations:   . Attends Archivist Meetings:   Marland Kitchen Marital Status:   Intimate  Partner Violence:   . Fear of Current or Ex-Partner:   . Emotionally Abused:   Marland Kitchen Physically Abused:   . Sexually Abused:     FAMILY HISTORY: Family History  Problem Relation Age of Onset  . Asthma Sister   . Hypertension Sister   . Hypertension Father   . Heart attack Father        MI > 66  . Prostate cancer Father   . Lung cancer Father   . Lung cancer Sister        smoker  . Melanoma Sister   . Osteoporosis Sister   . Glaucoma Sister        X 2  . Diverticulosis Sister        2 sisters  .  Lung cancer Sister   . Colon polyps Sister        2 sisters with polyps  . Diverticulitis Sister        1 sister  . Hypertension Mother   . Hypertension Brother   . Skin cancer Sister   . Diabetes Neg Hx   . Stroke Neg Hx   . Breast cancer Neg Hx     Review of Systems  Constitutional: Negative for appetite change, chills, fatigue, fever and unexpected weight change.  HENT:   Negative for hearing loss, lump/mass and mouth sores.   Eyes: Negative for eye problems and icterus.  Respiratory: Negative for chest tightness, cough and shortness of breath.   Cardiovascular: Negative for chest pain, leg swelling and palpitations.  Gastrointestinal: Positive for constipation. Negative for abdominal distention, blood in stool, diarrhea, nausea and vomiting.  Endocrine: Negative for hot flashes.  Genitourinary: Negative for difficulty urinating.   Musculoskeletal: Negative for arthralgias.  Skin: Negative for itching and rash.  Neurological: Negative for dizziness.  Hematological: Negative for adenopathy. Does not bruise/bleed easily.  Psychiatric/Behavioral: Negative for depression. The patient is not nervous/anxious.       PHYSICAL EXAMINATION  ECOG PERFORMANCE STATUS: 1 - Symptomatic but completely ambulatory  Vitals:   11/05/19 1333  BP: (!) 131/53  Pulse: 60  Resp: 18  Temp: 98.9 F (37.2 C)  SpO2: 92%    Physical Exam Constitutional:      General: She is not in acute  distress.    Appearance: Normal appearance. She is not toxic-appearing.  HENT:     Head: Normocephalic and atraumatic.  Cardiovascular:     Rate and Rhythm: Normal rate and regular rhythm.     Pulses: Normal pulses.     Heart sounds: Normal heart sounds.  Pulmonary:     Effort: Pulmonary effort is normal.     Breath sounds: Normal breath sounds.  Abdominal:     General: Abdomen is flat. Bowel sounds are normal. There is no distension.     Palpations: Abdomen is soft.  Musculoskeletal:        General: No swelling.  Skin:    General: Skin is warm and dry.     Capillary Refill: Capillary refill takes less than 2 seconds.     Findings: No rash.  Neurological:     General: No focal deficit present.     Mental Status: She is alert.  Psychiatric:        Mood and Affect: Mood normal.        Behavior: Behavior normal.     LABORATORY DATA:  CBC    Component Value Date/Time   WBC 4.7 11/05/2019 1251   WBC 6.4 09/29/2019 0556   RBC 4.00 11/05/2019 1251   HGB 12.9 11/05/2019 1251   HCT 40.3 11/05/2019 1251   PLT 226 11/05/2019 1251   MCV 100.8 (H) 11/05/2019 1251   MCH 32.3 11/05/2019 1251   MCHC 32.0 11/05/2019 1251   RDW 12.9 11/05/2019 1251   LYMPHSABS 1.8 11/05/2019 1251   MONOABS 1.0 11/05/2019 1251   EOSABS 0.1 11/05/2019 1251   BASOSABS 0.1 11/05/2019 1251    CMP     Component Value Date/Time   NA 142 11/05/2019 1251   K 3.9 11/05/2019 1251   CL 102 11/05/2019 1251   CO2 29 11/05/2019 1251   GLUCOSE 88 11/05/2019 1251   BUN 9 11/05/2019 1251   CREATININE 0.78 11/05/2019 1251   CALCIUM 10.1 11/05/2019 1251  PROT 7.3 11/05/2019 1251   ALBUMIN 3.7 11/05/2019 1251   AST 20 11/05/2019 1251   ALT 19 11/05/2019 1251   ALKPHOS 64 11/05/2019 1251   BILITOT 0.5 11/05/2019 1251   GFRNONAA >60 11/05/2019 1251   GFRAA >60 11/05/2019 1251        ASSESSMENT and THERAPY PLAN:   Malignant neoplasm of upper-outer quadrant of left breast in female, estrogen  receptor negative ([place]) 09/16/2018:Left lumpectomy (Tsuei): IDC, grade 3, 0.9cm, with high grade DCIS, 4 left axillary lymph nodes negative.Triple negative with a Ki-67 of 50%  Treatment plan: 1.Adjuvant chemotherapy with CMF 2.Adjuvant radiation therapy ------------------------------------------------------------------------------------------------------------------------------------------------------------ Current Treatment: Cycle 2 CMF Labs reviewed  I reviewed her labs with her in detail.  She will proceed with treatment today and will return in 3 weeks for her next cycle.  She has had significant constipation. I recommended stool softener, and miralax.    I recommended she continue with healthy diet and exercise.    All questions were answered. The patient knows to call the clinic with any problems, questions or concerns. We can certainly see the patient much sooner if necessary.  Total encounter time: 20 minutes*  Wilber Bihari, NP 11/05/19 2:04 PM Medical Oncology and Hematology [facility] [place], [place] [address] Tel. [phone]    Fax. [phone]  *Total Encounter Time as defined by the Centers for Medicare and Medicaid Services includes, in addition to the face-to-face time of a patient visit (documented in the note above) non-face-to-face time: obtaining and reviewing outside history, ordering and reviewing medications, tests or procedures, care coordination (communications with other health care professionals or caregivers) and documentation in the medical record.

## 2019-11-05 NOTE — Patient Instructions (Signed)
[facility] Discharge Instructions for Patients Receiving Chemotherapy  Today you received the following chemotherapy agents: Cytoxan, Methotrexate, and Fluorouracil  To help prevent nausea and vomiting after your treatment, we encourage you to take your nausea medication as directed by your MD.   If you develop nausea and vomiting that is not controlled by your nausea medication, call the clinic.   BELOW ARE SYMPTOMS THAT SHOULD BE REPORTED IMMEDIATELY:  *FEVER GREATER THAN 100.5 F  *CHILLS WITH OR WITHOUT FEVER  NAUSEA AND VOMITING THAT IS NOT CONTROLLED WITH YOUR NAUSEA MEDICATION  *UNUSUAL SHORTNESS OF BREATH  *UNUSUAL BRUISING OR BLEEDING  TENDERNESS IN MOUTH AND THROAT WITH OR WITHOUT PRESENCE OF ULCERS  *URINARY PROBLEMS  *BOWEL PROBLEMS  UNUSUAL RASH Items with * indicate a potential emergency and should be followed up as soon as possible.  Feel free to call the clinic should you have any questions or concerns. The clinic phone number is [phone].  Please show the CHEMO ALERT CARD at check-in to the Emergency Department and triage nurse.   

## 2019-11-06 ENCOUNTER — Telehealth: Payer: Self-pay | Admitting: Adult Health

## 2019-11-06 NOTE — Telephone Encounter (Signed)
No 7/1 los. No changes made to pt's schedule.

## 2019-11-25 NOTE — Progress Notes (Signed)
Patient Care Team: Hoyt Koch, MD as PCP - General (Internal Medicine) Mauro Kaufmann, RN as Oncology Nurse Navigator Rockwell Germany, RN as Oncology Nurse Navigator Donnie Mesa, MD as Consulting Physician (General Surgery) Nicholas Lose, MD as Consulting Physician (Hematology and Oncology) Kyung Rudd, MD as Consulting Physician (Radiation Oncology)  DIAGNOSIS:    ICD-10-CM   1. Malignant neoplasm of upper-outer quadrant of left breast in female, estrogen receptor negative ([place])  C50.412    Z17.1     SUMMARY OF ONCOLOGIC HISTORY: Oncology History  Malignant neoplasm of upper-outer quadrant of left breast in female, estrogen receptor negative ([place])  09/08/2019 Initial Diagnosis   Screening mammogram detected a left breast asymmetry. Diagnostic mammogram and US showed a 0.5cm mass, 2 o'clock position, with no axillary adenopathy. Biopsy showed IDC, grade 3, HER-2 - (0), ER/PR -, Ki67 50%.   09/09/2019 Cancer Staging   Staging form: Breast, AJCC 8th Edition - Clinical stage from 09/09/2019: Stage IB (cT1a, cN0, cM0, G3, ER-, PR-, HER2-) - Signed by Nicholas Lose, MD on 09/09/2019   09/16/2019 Surgery   Left lumpectomy (Tsuei): IDC, grade 3, 0.9cm, with high grade DCIS, 4 left axillary lymph nodes negative.    Radiation Therapy     09/16/2019 Cancer Staging   Staging form: Breast, AJCC 8th Edition - Pathologic stage from 09/16/2019: Stage IB (pT1b, pN0, cM0, G3, ER-, PR-, HER2-) - Signed by Gardenia Phlegm, NP on 10/07/2019   10/15/2019 -  Adjuvant Chemotherapy   CMF x 8     CHIEF COMPLIANT: Cycle 3 CMF  INTERVAL HISTORY: Kim Fuller is a 74 y.o. with above-mentioned history of triple negative left breastcancer who underwent a left lumpectomyand is currently on adjuvant chemotherapy with CMF. She presents to the clinic todayfor cycle 2.   She denies any nausea or vomiting.  She does have fatigue that last 3 to 4 days after each chemotherapy but she is  able to function normally.  Denies any constipation since she stopped taking the nausea medications.  ALLERGIES:  is allergic to sulfonamide derivatives and tramadol hcl.  MEDICATIONS:  Current Outpatient Medications  Medication Sig Dispense Refill  . acetaminophen (TYLENOL) 500 MG tablet Take 500-1,000 mg by mouth every 6 (six) hours as needed (for pain.).    Marland Kitchen aspirin 81 MG chewable tablet Chew 81 mg by mouth at bedtime.     . benazepril (LOTENSIN) 40 MG tablet Take 0.5 tablets (20 mg total) by mouth daily. 45 tablet 3  . bisoprolol-hydrochlorothiazide (ZIAC) 5-6.25 MG tablet Take 1 tablet by mouth daily. 90 tablet 3  . calcium carbonate (OSCAL) 1500 (600 Ca) MG TABS tablet Take 600 mg of elemental calcium by mouth 2 (two) times daily with a meal.    . cephALEXin (KEFLEX) 500 MG capsule Take 1 capsule (500 mg total) by mouth 2 (two) times daily. 14 capsule 0  . cholecalciferol (VITAMIN D3) 10 MCG (400 UNIT) TABS tablet Take 400 Units by mouth at bedtime.     Marland Kitchen esomeprazole (NEXIUM) 20 MG capsule Take 20 mg by mouth daily as needed (acid reflux/indigestion.).    Marland Kitchen fluticasone (FLONASE) 50 MCG/ACT nasal spray USE TWO SPRAY(S) IN EACH NOSTRIL DAILY (Patient taking differently: Place 1-2 sprays into both nostrils daily as needed (allergies.). ) 48 g 6  . ibuprofen (ADVIL) 200 MG tablet Take 400 mg by mouth every 8 (eight) hours as needed (pain.).     Marland Kitchen lidocaine-prilocaine (EMLA) cream Apply to affected area once  30 g 3  . Multiple Vitamin (MULTIVITAMIN WITH MINERALS) TABS tablet Take 1 tablet by mouth daily. Centrum Silver    . Omega-3 Fatty Acids (FISH OIL) 1000 MG CAPS Take 1,000 mg by mouth 2 (two) times daily.    . ondansetron (ZOFRAN) 8 MG tablet Take 1 tablet (8 mg total) by mouth 2 (two) times daily as needed for refractory nausea / vomiting. Start on day 3 after chemotherapy. 30 tablet 1  . pravastatin (PRAVACHOL) 20 MG tablet Take 1 tablet (20 mg total) by mouth daily. (Patient taking  differently: Take 20 mg by mouth every evening. ) 90 tablet 3  . prochlorperazine (COMPAZINE) 10 MG tablet Take 1 tablet (10 mg total) by mouth every 6 (six) hours as needed (Nausea or vomiting). 30 tablet 1  . Propylene Glycol (SYSTANE BALANCE) 0.6 % SOLN Place 1 drop 3 (three) times daily as needed into both eyes (dry eyes).    . vitamin E 400 UNIT capsule Take 400 Units by mouth daily.     No current facility-administered medications for this visit.    PHYSICAL EXAMINATION: ECOG PERFORMANCE STATUS: 1 - Symptomatic but completely ambulatory  Vitals:   11/26/19 0905  BP: (!) 138/56  Pulse: (!) 58  Resp: 16  Temp: (!) 97.1 F (36.2 C)  SpO2: 100%   Filed Weights   11/26/19 0905  Weight: 212 lb 14.4 oz (96.6 kg)    LABORATORY DATA:  I have reviewed the data as listed CMP Latest Ref Rng & Units 11/26/2019 11/05/2019 10/27/2019  Glucose 70 - 99 mg/dL 90 88 123(H)  BUN 8 - 23 mg/dL _0 Creatinine 0.44 - 1.00 mg/dL 0.77 0.78 0.82  Sodium 135 - 145 mmol/L 142 142 140  Potassium 3.5 - 5.1 mmol/L 3.8 3.9 3.8  Chloride 98 - 111 mmol/L 104 102 104  CO2 22 - 32 mmol/L _1 Calcium 8.9 - 10.3 mg/dL 10.1 10.1 9.3  Total Protein 6.5 - 8.1 g/dL 7.1 7.3 6.5  Total Bilirubin 0.3 - 1.2 mg/dL 0.5 0.5 0.5  Alkaline Phos 38 - 126 U/L 62 64 57  AST 15 - 41 U/L _2 ALT 0 - 44 U/L _3 Lab Results  Component Value Date   WBC 4.2 11/26/2019   HGB 13.1 11/26/2019   HCT 40.4 11/26/2019   MCV 99.0 11/26/2019   PLT 216 11/26/2019   NEUTROABS 1.5 (L) 11/26/2019    ASSESSMENT & PLAN:  Malignant neoplasm of upper-outer quadrant of left breast in female, estrogen receptor negative ([place]) 09/16/2018:Left lumpectomy (Tsuei): IDC, grade 3, 0.9cm, with high grade DCIS, 4 left axillary lymph nodes negative.Triple negative with a Ki-67 of 50%  Treatment plan: 1.Adjuvant chemotherapy with CMF 2.Adjuvant radiation  therapy ------------------------------------------------------------------------------------------------------------------------------------------------------------ Current Treatment: Cycle 3 CMF Labs reviewed ANC 1.5: Okay to treat. I reviewed her labs with her in detail.  She will proceed with treatment today and will return in 3 weeks for her next cycle.  She has had significant constipation. I recommended stool softener, and miralax.    I recommended she continue with healthy diet and exercise.  Return to clinic in 3 weeks for cycle 4    No orders of the defined types were placed in this encounter.  The patient has a good understanding of the overall plan. she agrees with it. she will call with any problems that may develop before the next visit here.  Total time spent: 30 mins  including face to face time and time spent for planning, charting and coordination of care  Nicholas Lose, MD 11/26/2019  I, Molly Dorshimer, am acting as scribe for Dr. Nicholas Lose.  I have reviewed the above documentation for accuracy and completeness, and I agree with the above.

## 2019-11-25 NOTE — Assessment & Plan Note (Signed)
09/16/2018:Left lumpectomy (Tsuei): IDC, grade 3, 0.9cm, with high grade DCIS, 4 left axillary lymph nodes negative.Triple negative with a Ki-67 of 50%  Treatment plan: 1.Adjuvant chemotherapy with CMF 2.Adjuvant radiation therapy ------------------------------------------------------------------------------------------------------------------------------------------------------------ Current Treatment: Cycle 3 CMF Labs reviewed  I reviewed her labs with her in detail.  She will proceed with treatment today and will return in 3 weeks for her next cycle.  She has had significant constipation. I recommended stool softener, and miralax.    I recommended she continue with healthy diet and exercise.  Return to clinic in 3 weeks for cycle 4

## 2019-11-26 ENCOUNTER — Inpatient Hospital Stay: Payer: Medicare Other

## 2019-11-26 ENCOUNTER — Inpatient Hospital Stay (HOSPITAL_BASED_OUTPATIENT_CLINIC_OR_DEPARTMENT_OTHER): Payer: Medicare Other | Admitting: Hematology and Oncology

## 2019-11-26 ENCOUNTER — Other Ambulatory Visit: Payer: Self-pay

## 2019-11-26 DIAGNOSIS — Z95828 Presence of other vascular implants and grafts: Secondary | ICD-10-CM

## 2019-11-26 DIAGNOSIS — C50412 Malignant neoplasm of upper-outer quadrant of left female breast: Secondary | ICD-10-CM | POA: Diagnosis not present

## 2019-11-26 DIAGNOSIS — Z171 Estrogen receptor negative status [ER-]: Secondary | ICD-10-CM

## 2019-11-26 DIAGNOSIS — Z5111 Encounter for antineoplastic chemotherapy: Secondary | ICD-10-CM | POA: Diagnosis not present

## 2019-11-26 LAB — CMP (CANCER CENTER ONLY)
ALT: 19 U/L (ref 0–44)
AST: 24 U/L (ref 15–41)
Albumin: 3.6 g/dL (ref 3.5–5.0)
Alkaline Phosphatase: 62 U/L (ref 38–126)
Anion gap: 10 (ref 5–15)
BUN: 11 mg/dL (ref 8–23)
CO2: 28 mmol/L (ref 22–32)
Calcium: 10.1 mg/dL (ref 8.9–10.3)
Chloride: 104 mmol/L (ref 98–111)
Creatinine: 0.77 mg/dL (ref 0.44–1.00)
GFR, Est AFR Am: >60 mL/min (ref >60)
GFR, Estimated: >60 mL/min (ref >60)
Glucose, Bld: 90 mg/dL (ref 70–99)
Potassium: 3.8 mmol/L (ref 3.5–5.1)
Sodium: 142 mmol/L (ref 135–145)
Total Bilirubin: 0.5 mg/dL (ref 0.3–1.2)
Total Protein: 7.1 g/dL (ref 6.5–8.1)

## 2019-11-26 LAB — CBC WITH DIFFERENTIAL (CANCER CENTER ONLY)
Abs Immature Granulocytes: 0.07 10*3/uL (ref 0.00–0.07)
Basophils Absolute: 0 10*3/uL (ref 0.0–0.1)
Basophils Relative: 1 %
Eosinophils Absolute: 0.1 10*3/uL (ref 0.0–0.5)
Eosinophils Relative: 3 %
HCT: 40.4 % (ref 36.0–46.0)
Hemoglobin: 13.1 g/dL (ref 12.0–15.0)
Immature Granulocytes: 2 %
Lymphocytes Relative: 34 %
Lymphs Abs: 1.5 10*3/uL (ref 0.7–4.0)
MCH: 32.1 pg (ref 26.0–34.0)
MCHC: 32.4 g/dL (ref 30.0–36.0)
MCV: 99 fL (ref 80.0–100.0)
Monocytes Absolute: 1 10*3/uL (ref 0.1–1.0)
Monocytes Relative: 25 %
Neutro Abs: 1.5 10*3/uL — ABNORMAL LOW (ref 1.7–7.7)
Neutrophils Relative %: 35 %
Platelet Count: 216 10*3/uL (ref 150–400)
RBC: 4.08 MIL/uL (ref 3.87–5.11)
RDW: 13.2 % (ref 11.5–15.5)
WBC Count: 4.2 10*3/uL (ref 4.0–10.5)
nRBC: 0 % (ref 0.0–0.2)

## 2019-11-26 MED ORDER — HEPARIN SOD (PORK) LOCK FLUSH 100 UNIT/ML IV SOLN
500.0000 [IU] | Freq: Once | INTRAVENOUS | Status: AC | PRN
  Administered 2019-11-26: 500 [IU]
  Filled 2019-11-26: qty 5

## 2019-11-26 MED ORDER — PALONOSETRON HCL INJECTION 0.25 MG/5ML
INTRAVENOUS | Status: AC
  Filled 2019-11-26: qty 5

## 2019-11-26 MED ORDER — SODIUM CHLORIDE 0.9 % IV SOLN
10.0000 mg | Freq: Once | INTRAVENOUS | Status: AC
  Administered 2019-11-26: 10 mg via INTRAVENOUS
  Filled 2019-11-26: qty 10

## 2019-11-26 MED ORDER — FLUOROURACIL CHEMO INJECTION 2.5 GM/50ML
500.0000 mg/m2 | Freq: Once | INTRAVENOUS | Status: AC
  Administered 2019-11-26: 1050 mg via INTRAVENOUS
  Filled 2019-11-26: qty 21

## 2019-11-26 MED ORDER — SODIUM CHLORIDE 0.9% FLUSH
10.0000 mL | INTRAVENOUS | Status: DC | PRN
  Administered 2019-11-26: 10 mL
  Filled 2019-11-26: qty 10

## 2019-11-26 MED ORDER — SODIUM CHLORIDE 0.9 % IV SOLN
Freq: Once | INTRAVENOUS | Status: AC
  Filled 2019-11-26: qty 250

## 2019-11-26 MED ORDER — SODIUM CHLORIDE 0.9 % IV SOLN
500.0000 mg/m2 | Freq: Once | INTRAVENOUS | Status: AC
  Administered 2019-11-26: 1040 mg via INTRAVENOUS
  Filled 2019-11-26: qty 52

## 2019-11-26 MED ORDER — PALONOSETRON HCL INJECTION 0.25 MG/5ML
0.2500 mg | Freq: Once | INTRAVENOUS | Status: AC
  Administered 2019-11-26: 0.25 mg via INTRAVENOUS

## 2019-11-26 MED ORDER — METHOTREXATE SODIUM (PF) CHEMO INJECTION 250 MG/10ML
30.0000 mg/m2 | Freq: Once | INTRAMUSCULAR | Status: AC
  Administered 2019-11-26: 62 mg via INTRAVENOUS
  Filled 2019-11-26: qty 2.48

## 2019-11-26 MED ORDER — SODIUM CHLORIDE 0.9% FLUSH
10.0000 mL | Freq: Once | INTRAVENOUS | Status: AC
  Administered 2019-11-26: 10 mL
  Filled 2019-11-26: qty 10

## 2019-11-26 NOTE — Patient Instructions (Signed)
[facility] Discharge Instructions for Patients Receiving Chemotherapy  Today you received the following chemotherapy agents: Cytoxan, Methotrexate, and Fluorouracil  To help prevent nausea and vomiting after your treatment, we encourage you to take your nausea medication as directed by your MD.   If you develop nausea and vomiting that is not controlled by your nausea medication, call the clinic.   BELOW ARE SYMPTOMS THAT SHOULD BE REPORTED IMMEDIATELY:  *FEVER GREATER THAN 100.5 F  *CHILLS WITH OR WITHOUT FEVER  NAUSEA AND VOMITING THAT IS NOT CONTROLLED WITH YOUR NAUSEA MEDICATION  *UNUSUAL SHORTNESS OF BREATH  *UNUSUAL BRUISING OR BLEEDING  TENDERNESS IN MOUTH AND THROAT WITH OR WITHOUT PRESENCE OF ULCERS  *URINARY PROBLEMS  *BOWEL PROBLEMS  UNUSUAL RASH Items with * indicate a potential emergency and should be followed up as soon as possible.  Feel free to call the clinic should you have any questions or concerns. The clinic phone number is [phone].  Please show the CHEMO ALERT CARD at check-in to the Emergency Department and triage nurse.   

## 2019-11-27 ENCOUNTER — Encounter: Payer: Self-pay | Admitting: Internal Medicine

## 2019-11-27 ENCOUNTER — Telehealth: Payer: Self-pay | Admitting: Hematology and Oncology

## 2019-11-27 MED ORDER — FLUTICASONE PROPIONATE 50 MCG/ACT NA SUSP: NASAL | 6 refills | Status: DC

## 2019-11-27 NOTE — Telephone Encounter (Signed)
Scheduled appts per 7/22 los. Pt confirmed appt dates and times.

## 2019-12-16 NOTE — Progress Notes (Signed)
Patient Care Team: Hoyt Koch, MD as PCP - General (Internal Medicine) Mauro Kaufmann, RN as Oncology Nurse Navigator Rockwell Germany, RN as Oncology Nurse Navigator Donnie Mesa, MD as Consulting Physician (General Surgery) Nicholas Lose, MD as Consulting Physician (Hematology and Oncology) Kyung Rudd, MD as Consulting Physician (Radiation Oncology)  DIAGNOSIS:    ICD-10-CM   1. Malignant neoplasm of upper-outer quadrant of left breast in female, estrogen receptor negative ([place])  C50.412    Z17.1     SUMMARY OF ONCOLOGIC HISTORY: Oncology History  Malignant neoplasm of upper-outer quadrant of left breast in female, estrogen receptor negative ([place])  09/08/2019 Initial Diagnosis   Screening mammogram detected a left breast asymmetry. Diagnostic mammogram and US showed a 0.5cm mass, 2 o'clock position, with no axillary adenopathy. Biopsy showed IDC, grade 3, HER-2 - (0), ER/PR -, Ki67 50%.   09/09/2019 Cancer Staging   Staging form: Breast, AJCC 8th Edition - Clinical stage from 09/09/2019: Stage IB (cT1a, cN0, cM0, G3, ER-, PR-, HER2-) - Signed by Nicholas Lose, MD on 09/09/2019   09/16/2019 Surgery   Left lumpectomy (Tsuei): IDC, grade 3, 0.9cm, with high grade DCIS, 4 left axillary lymph nodes negative.    Radiation Therapy     09/16/2019 Cancer Staging   Staging form: Breast, AJCC 8th Edition - Pathologic stage from 09/16/2019: Stage IB (pT1b, pN0, cM0, G3, ER-, PR-, HER2-) - Signed by Gardenia Phlegm, NP on 10/07/2019   10/15/2019 -  Adjuvant Chemotherapy   CMF x 8     CHIEF COMPLIANT: Cycle 4 CMF  INTERVAL HISTORY: Kim Fuller is a 74 y.o. with above-mentioned history of triple negative left breastcancer who underwent a left lumpectomyand is currently on adjuvant chemotherapy with CMF. She presents to the clinic todayfor cycle4. She complains of having more tiredness this past chemotherapy as well as episodes of nausea.  She took antinausea  drugs.  Diarrhea was also present initially but then it stopped.  ALLERGIES:  is allergic to sulfonamide derivatives and tramadol hcl.  MEDICATIONS:  Current Outpatient Medications  Medication Sig Dispense Refill  . acetaminophen (TYLENOL) 500 MG tablet Take 500-1,000 mg by mouth every 6 (six) hours as needed (for pain.).    Marland Kitchen aspirin 81 MG chewable tablet Chew 81 mg by mouth at bedtime.     . benazepril (LOTENSIN) 40 MG tablet Take 0.5 tablets (20 mg total) by mouth daily. 45 tablet 3  . bisoprolol-hydrochlorothiazide (ZIAC) 5-6.25 MG tablet Take 1 tablet by mouth daily. 90 tablet 3  . calcium carbonate (OSCAL) 1500 (600 Ca) MG TABS tablet Take 600 mg of elemental calcium by mouth 2 (two) times daily with a meal.    . cephALEXin (KEFLEX) 500 MG capsule Take 1 capsule (500 mg total) by mouth 2 (two) times daily. 14 capsule 0  . cholecalciferol (VITAMIN D3) 10 MCG (400 UNIT) TABS tablet Take 400 Units by mouth at bedtime.     Marland Kitchen esomeprazole (NEXIUM) 20 MG capsule Take 20 mg by mouth daily as needed (acid reflux/indigestion.).    Marland Kitchen fluticasone (FLONASE) 50 MCG/ACT nasal spray USE TWO SPRAY(S) IN EACH NOSTRIL DAILY 48 g 6  . ibuprofen (ADVIL) 200 MG tablet Take 400 mg by mouth every 8 (eight) hours as needed (pain.).     Marland Kitchen lidocaine-prilocaine (EMLA) cream Apply to affected area once 30 g 3  . Multiple Vitamin (MULTIVITAMIN WITH MINERALS) TABS tablet Take 1 tablet by mouth daily. Centrum Silver    . Omega-3  Fatty Acids (FISH OIL) 1000 MG CAPS Take 1,000 mg by mouth 2 (two) times daily.    . ondansetron (ZOFRAN) 8 MG tablet Take 1 tablet (8 mg total) by mouth 2 (two) times daily as needed for refractory nausea / vomiting. Start on day 3 after chemotherapy. 30 tablet 1  . pravastatin (PRAVACHOL) 20 MG tablet Take 1 tablet (20 mg total) by mouth daily. (Patient taking differently: Take 20 mg by mouth every evening. ) 90 tablet 3  . prochlorperazine (COMPAZINE) 10 MG tablet Take 1 tablet (10 mg total)  by mouth every 6 (six) hours as needed (Nausea or vomiting). 30 tablet 1  . Propylene Glycol (SYSTANE BALANCE) 0.6 % SOLN Place 1 drop 3 (three) times daily as needed into both eyes (dry eyes).    . vitamin E 400 UNIT capsule Take 400 Units by mouth daily.     No current facility-administered medications for this visit.    PHYSICAL EXAMINATION: ECOG PERFORMANCE STATUS: 1 - Symptomatic but completely ambulatory  Vitals:   12/17/19 1351  BP: 131/62  Pulse: 61  Resp: 17  Temp: (!) 97.5 F (36.4 C)  SpO2: 96%   Filed Weights   12/17/19 1351  Weight: 214 lb 6.4 oz (97.3 kg)    LABORATORY DATA:  I have reviewed the data as listed CMP Latest Ref Rng & Units 11/26/2019 11/05/2019 10/27/2019  Glucose 70 - 99 mg/dL 90 88 123(H)  BUN 8 - 23 mg/dL '11 9 10  '$ Creatinine 0.44 - 1.00 mg/dL 0.77 0.78 0.82  Sodium 135 - 145 mmol/L 142 142 140  Potassium 3.5 - 5.1 mmol/L 3.8 3.9 3.8  Chloride 98 - 111 mmol/L 104 102 104  CO2 22 - 32 mmol/L '28 29 29  '$ Calcium 8.9 - 10.3 mg/dL 10.1 10.1 9.3  Total Protein 6.5 - 8.1 g/dL 7.1 7.3 6.5  Total Bilirubin 0.3 - 1.2 mg/dL 0.5 0.5 0.5  Alkaline Phos 38 - 126 U/L 62 64 57  AST 15 - 41 U/L '24 20 20  '$ ALT 0 - 44 U/L '19 19 20    '$ Lab Results  Component Value Date   WBC 3.4 (L) 12/17/2019   HGB 13.1 12/17/2019   HCT 40.2 12/17/2019   MCV 99.3 12/17/2019   PLT 195 12/17/2019   NEUTROABS 1.0 (L) 12/17/2019    ASSESSMENT & PLAN:  Malignant neoplasm of upper-outer quadrant of left breast in female, estrogen receptor negative ([place]) 09/16/2018:Left lumpectomy (Ts[facility]i): IDC, grade 3, 0.9cm, with high grade DCIS, 4 left axillary lymph nodes negative.Triple negative with a Ki-67 of 50%  Treatment plan: 1.Adjuvant chemotherapy with CMF 2.Adjuvant radiation therapy ------------------------------------------------------------------------------------------------------------------------------------------------------------ Current Treatment: Cycle 4  CMF Labs reviewed Chemo toxicities: 1. [place] 1 : Okay to treat.  I lowered the dosage of cyclophosphamide and 5-FU today. We will plan to administer Neulasta 2. constipation: Alternating with diarrhea  I recommended she continue with healthy diet and exercise. Return to clinic in 3 weeks for cycle 5    No orders of the defined types were placed in this encounter.  The patient has a good understanding of the overall plan. she agrees with it. she will call with any problems that may develop before the next visit here.  Total time spent: 30 mins including face to face time and time spent for planning, charting and coordination of care  Nicholas Lose, MD 12/17/2019  I, Cloyde Reams Dorshimer, am acting as scribe for Dr. Nicholas Lose.  I have reviewed the above documentation for accuracy  and completeness, and I agree with the above.       

## 2019-12-17 ENCOUNTER — Other Ambulatory Visit: Payer: Self-pay

## 2019-12-17 ENCOUNTER — Inpatient Hospital Stay (HOSPITAL_BASED_OUTPATIENT_CLINIC_OR_DEPARTMENT_OTHER): Payer: Medicare Other | Admitting: Hematology and Oncology

## 2019-12-17 ENCOUNTER — Encounter: Payer: Self-pay | Admitting: *Deleted

## 2019-12-17 ENCOUNTER — Inpatient Hospital Stay: Payer: Medicare Other

## 2019-12-17 ENCOUNTER — Inpatient Hospital Stay: Payer: Medicare Other | Attending: Hematology and Oncology

## 2019-12-17 DIAGNOSIS — Z79899 Other long term (current) drug therapy: Secondary | ICD-10-CM | POA: Insufficient documentation

## 2019-12-17 DIAGNOSIS — Z5111 Encounter for antineoplastic chemotherapy: Secondary | ICD-10-CM | POA: Insufficient documentation

## 2019-12-17 DIAGNOSIS — Z171 Estrogen receptor negative status [ER-]: Secondary | ICD-10-CM | POA: Insufficient documentation

## 2019-12-17 DIAGNOSIS — C50412 Malignant neoplasm of upper-outer quadrant of left female breast: Secondary | ICD-10-CM | POA: Diagnosis not present

## 2019-12-17 DIAGNOSIS — K5900 Constipation, unspecified: Secondary | ICD-10-CM | POA: Diagnosis not present

## 2019-12-17 DIAGNOSIS — Z923 Personal history of irradiation: Secondary | ICD-10-CM | POA: Diagnosis not present

## 2019-12-17 DIAGNOSIS — Z95828 Presence of other vascular implants and grafts: Secondary | ICD-10-CM

## 2019-12-17 DIAGNOSIS — R197 Diarrhea, unspecified: Secondary | ICD-10-CM | POA: Insufficient documentation

## 2019-12-17 DIAGNOSIS — R110 Nausea: Secondary | ICD-10-CM | POA: Insufficient documentation

## 2019-12-17 DIAGNOSIS — Z5189 Encounter for other specified aftercare: Secondary | ICD-10-CM | POA: Diagnosis not present

## 2019-12-17 DIAGNOSIS — Z7982 Long term (current) use of aspirin: Secondary | ICD-10-CM | POA: Diagnosis not present

## 2019-12-17 LAB — CBC WITH DIFFERENTIAL (CANCER CENTER ONLY)
Abs Immature Granulocytes: 0.1 10*3/uL — ABNORMAL HIGH (ref 0.00–0.07)
Basophils Absolute: 0.1 10*3/uL (ref 0.0–0.1)
Basophils Relative: 2 %
Eosinophils Absolute: 0.1 10*3/uL (ref 0.0–0.5)
Eosinophils Relative: 4 %
HCT: 40.2 % (ref 36.0–46.0)
Hemoglobin: 13.1 g/dL (ref 12.0–15.0)
Immature Granulocytes: 3 %
Lymphocytes Relative: 34 %
Lymphs Abs: 1.2 10*3/uL (ref 0.7–4.0)
MCH: 32.3 pg (ref 26.0–34.0)
MCHC: 32.6 g/dL (ref 30.0–36.0)
MCV: 99.3 fL (ref 80.0–100.0)
Monocytes Absolute: 0.9 10*3/uL (ref 0.1–1.0)
Monocytes Relative: 26 %
Neutro Abs: 1 10*3/uL — ABNORMAL LOW (ref 1.7–7.7)
Neutrophils Relative %: 31 %
Platelet Count: 195 10*3/uL (ref 150–400)
RBC: 4.05 MIL/uL (ref 3.87–5.11)
RDW: 13.6 % (ref 11.5–15.5)
WBC Count: 3.4 10*3/uL — ABNORMAL LOW (ref 4.0–10.5)
nRBC: 0.6 % — ABNORMAL HIGH (ref 0.0–0.2)

## 2019-12-17 LAB — CMP (CANCER CENTER ONLY)
ALT: 17 U/L (ref 0–44)
AST: 20 U/L (ref 15–41)
Albumin: 3.5 g/dL (ref 3.5–5.0)
Alkaline Phosphatase: 61 U/L (ref 38–126)
Anion gap: 9 (ref 5–15)
BUN: 9 mg/dL (ref 8–23)
CO2: 28 mmol/L (ref 22–32)
Calcium: 10.4 mg/dL — ABNORMAL HIGH (ref 8.9–10.3)
Chloride: 105 mmol/L (ref 98–111)
Creatinine: 0.77 mg/dL (ref 0.44–1.00)
GFR, Est AFR Am: >60 mL/min (ref >60)
GFR, Estimated: >60 mL/min (ref >60)
Glucose, Bld: 90 mg/dL (ref 70–99)
Potassium: 3.9 mmol/L (ref 3.5–5.1)
Sodium: 142 mmol/L (ref 135–145)
Total Bilirubin: 0.5 mg/dL (ref 0.3–1.2)
Total Protein: 7.1 g/dL (ref 6.5–8.1)

## 2019-12-17 MED ORDER — FLUOROURACIL CHEMO INJECTION 2.5 GM/50ML
400.0000 mg/m2 | Freq: Once | INTRAVENOUS | Status: AC
  Administered 2019-12-17: 850 mg via INTRAVENOUS
  Filled 2019-12-17: qty 17

## 2019-12-17 MED ORDER — PALONOSETRON HCL INJECTION 0.25 MG/5ML
0.2500 mg | Freq: Once | INTRAVENOUS | Status: AC
  Administered 2019-12-17: 0.25 mg via INTRAVENOUS

## 2019-12-17 MED ORDER — HEPARIN SOD (PORK) LOCK FLUSH 100 UNIT/ML IV SOLN
500.0000 [IU] | Freq: Once | INTRAVENOUS | Status: AC | PRN
  Administered 2019-12-17: 500 [IU]
  Filled 2019-12-17: qty 5

## 2019-12-17 MED ORDER — SODIUM CHLORIDE 0.9 % IV SOLN
400.0000 mg/m2 | Freq: Once | INTRAVENOUS | Status: AC
  Administered 2019-12-17: 820 mg via INTRAVENOUS
  Filled 2019-12-17: qty 41

## 2019-12-17 MED ORDER — SODIUM CHLORIDE 0.9% FLUSH
10.0000 mL | Freq: Once | INTRAVENOUS | Status: AC
  Administered 2019-12-17: 10 mL
  Filled 2019-12-17: qty 10

## 2019-12-17 MED ORDER — SODIUM CHLORIDE 0.9 % IV SOLN
10.0000 mg | Freq: Once | INTRAVENOUS | Status: AC
  Administered 2019-12-17: 10 mg via INTRAVENOUS
  Filled 2019-12-17: qty 10

## 2019-12-17 MED ORDER — SODIUM CHLORIDE 0.9 % IV SOLN
Freq: Once | INTRAVENOUS | Status: AC
  Filled 2019-12-17: qty 250

## 2019-12-17 MED ORDER — SODIUM CHLORIDE 0.9% FLUSH
10.0000 mL | INTRAVENOUS | Status: DC | PRN
  Administered 2019-12-17: 10 mL
  Filled 2019-12-17: qty 10

## 2019-12-17 MED ORDER — PALONOSETRON HCL INJECTION 0.25 MG/5ML
INTRAVENOUS | Status: AC
  Filled 2019-12-17: qty 5

## 2019-12-17 MED ORDER — METHOTREXATE SODIUM (PF) CHEMO INJECTION 250 MG/10ML
30.0000 mg/m2 | Freq: Once | INTRAMUSCULAR | Status: AC
  Administered 2019-12-17: 62 mg via INTRAVENOUS
  Filled 2019-12-17: qty 2.48

## 2019-12-17 NOTE — Patient Instructions (Signed)

## 2019-12-17 NOTE — Assessment & Plan Note (Signed)
09/16/2018:Left lumpectomy (Ts[facility]i): IDC, grade 3, 0.9cm, with high grade DCIS, 4 left axillary lymph nodes negative.Triple negative with a Ki-67 of 50%  Treatment plan: 1.Adjuvant chemotherapy with CMF 2.Adjuvant radiation therapy ------------------------------------------------------------------------------------------------------------------------------------------------------------ Current Treatment: Cycle 4 CMF Labs reviewed Chemo toxicities: 1. ANC 1.5: Okay to treat. 2. constipation: Stool softener and MiraLAX  I recommended Kim Fuller continue with healthy diet and exercise. Return to clinic in 3 weeks for cycle 5

## 2019-12-19 ENCOUNTER — Other Ambulatory Visit: Payer: Self-pay

## 2019-12-19 ENCOUNTER — Inpatient Hospital Stay: Payer: Medicare Other

## 2019-12-19 VITALS — BP 142/75 | HR 66 | Temp 97.6°F | Resp 91

## 2019-12-19 DIAGNOSIS — R197 Diarrhea, unspecified: Secondary | ICD-10-CM | POA: Diagnosis not present

## 2019-12-19 DIAGNOSIS — K5900 Constipation, unspecified: Secondary | ICD-10-CM | POA: Diagnosis not present

## 2019-12-19 DIAGNOSIS — Z5111 Encounter for antineoplastic chemotherapy: Secondary | ICD-10-CM | POA: Diagnosis not present

## 2019-12-19 DIAGNOSIS — Z5189 Encounter for other specified aftercare: Secondary | ICD-10-CM | POA: Diagnosis not present

## 2019-12-19 DIAGNOSIS — Z171 Estrogen receptor negative status [ER-]: Secondary | ICD-10-CM | POA: Diagnosis not present

## 2019-12-19 DIAGNOSIS — C50412 Malignant neoplasm of upper-outer quadrant of left female breast: Secondary | ICD-10-CM | POA: Diagnosis not present

## 2019-12-19 MED ORDER — PEGFILGRASTIM-CBQV 6 MG/0.6ML ~~LOC~~ SOSY
6.0000 mg | PREFILLED_SYRINGE | Freq: Once | SUBCUTANEOUS | Status: AC
  Administered 2019-12-19: 6 mg via SUBCUTANEOUS

## 2019-12-19 NOTE — Patient Instructions (Signed)

## 2019-12-21 ENCOUNTER — Other Ambulatory Visit: Payer: Self-pay | Admitting: *Deleted

## 2019-12-21 MED ORDER — MAGIC MOUTHWASH W/LIDOCAINE
5.0000 mL | Freq: Three times a day (TID) | ORAL | 1 refills | Status: DC | PRN
Start: 2019-12-21 — End: 2019-12-21

## 2019-12-21 MED ORDER — NYSTATIN 100000 UNIT/ML MT SUSP
5.0000 mL | Freq: Four times a day (QID) | OROMUCOSAL | 1 refills | Status: DC
Start: 2019-12-21 — End: 2020-01-29

## 2019-12-21 NOTE — Progress Notes (Signed)
Received call from pt with complain of mouth sores and tenderness.  Pt reports white patches in the back of her mouth and is causing difficulty swallowing.  Per MD pt to be prescribed Nystatin Suspension  QID as needed. Prescription sent to pharmacy on file and pt verbalized understanding.  Pt states she will call the office if symptoms do not improve.

## 2019-12-28 ENCOUNTER — Telehealth: Payer: Self-pay | Admitting: *Deleted

## 2019-12-28 ENCOUNTER — Ambulatory Visit: Payer: Medicare Other | Attending: Surgery

## 2019-12-28 ENCOUNTER — Other Ambulatory Visit: Payer: Self-pay

## 2019-12-28 DIAGNOSIS — Z171 Estrogen receptor negative status [ER-]: Secondary | ICD-10-CM | POA: Insufficient documentation

## 2019-12-28 DIAGNOSIS — C50412 Malignant neoplasm of upper-outer quadrant of left female breast: Secondary | ICD-10-CM | POA: Insufficient documentation

## 2019-12-28 NOTE — Therapy (Signed)
[place], [place], [address] Phone: [phone]   Fax:  [phone]  Physical Therapy Treatment  Patient Details  Name: Kim Fuller MRN: [id number] Date of Birth: 1945/11/17 Referring Provider (PT): Dr. Donnie Mesa   Encounter Date: 12/28/2019   PT End of Session - 12/28/19 0913    Visit Number 2   # unchanged due to screen only   Number of Visits 2    PT Start Time 0908    PT Stop Time 0913    PT Time Calculation (min) 5 min           Past Medical History:  Diagnosis Date  . Abnormal finding on Pap smear    x1  . Diverticulosis   . GERD (gastroesophageal reflux disease)   . Hyperlipidemia   . Hypertension   . Osteopenia    -1.5 @ femoral neck 11/2007  . Reactive airway disease    years ago ; severe asthma attack "bornchitis asthma " per patient, no issues since     Past Surgical History:  Procedure Laterality Date  . BREAST LUMPECTOMY WITH RADIOACTIVE SEED AND SENTINEL LYMPH NODE BIOPSY Left 09/16/2019   Procedure: LEFT BREAST LUMPECTOMY WITH RADIOACTIVE SEED AND SENTINEL LYMPH NODE BIOPSY;  Surgeon: Donnie Mesa, MD;  Location: [facility];  Service: General;  Laterality: Left;  . CARDIOVASCULAR STRESS TEST  03/05/06  . CARPAL TUNNEL RELEASE  2009    bilaterally  . CATARACT EXTRACTION, BILATERAL     Dr Gershon Crane  . COLONOSCOPY W/ POLYPECTOMY  1998   Tics @ 2004 & 2012; Dr Carlean Purl  . HAND SURGERY     Trigger thumb  . PARTIAL KNEE ARTHROPLASTY Left 04/03/2017   Procedure: Left knee medial unicompartmental arthroplasty;  Surgeon: Gaynelle Arabian, MD;  Location: [facility];  Service: Orthopedics;  Laterality: Left;  Adductor canal block  . PORTACATH PLACEMENT N/A 09/29/2019   Procedure: INSERTION PORT-A-CATH;  Surgeon: Donnie Mesa, MD;  Location: [facility];  Service: General;  Laterality: N/A;  . TRIGGER FINGER RELEASE Right   . TUBAL LIGATION      There were no  vitals filed for this visit.   Subjective Assessment - 12/28/19 0910    Subjective Pt returns for 3 month L-Dex screen.                  L-DEX FLOWSHEETS - 12/28/19 0900      L-DEX LYMPHEDEMA SCREENING   BASELINE SCORE (UNILATERAL) 6    L-DEX SCORE (UNILATERAL) 5    VALUE CHANGE (UNILAT) -1                                  PT Long Term Goals - 11/02/19 1128      PT LONG TERM GOAL #1   Title Patient will demonstrate she has regained full shoulder ROM and function post operatively compared to baselines.    Time 8    Period Weeks    Status Partially Met                 Plan - 12/28/19 0914    Clinical Impression Statement Pt returnsfor 3 month L-Dex screen. Her score was within normal range having a score of 5.0 leaving her with a change of -1.0 from baseline. Pt is agreeable to continue returning for every 3 month screens at this time. No further treatment indicated at  this time.    PT Next Visit Plan Cont every 3 month L-Dex screens for 2 years (until ~09/15/2021)    Consulted and Agree with Plan of Care Patient           Patient will benefit from skilled therapeutic intervention in order to improve the following deficits and impairments:     Visit Diagnosis: Malignant neoplasm of upper-outer quadrant of left breast in female, estrogen receptor negative ([place])     Problem List Patient Active Problem List   Diagnosis Date Noted  . Port-A-Cath in place 10/27/2019  . Malignant neoplasm of upper-outer quadrant of left breast in female, estrogen receptor negative ([place]) 09/08/2019  . Acute left-sided low back pain without sciatica 07/06/2019  . Morbid obesity ([place]) 04/24/2019  . Trigger finger, right middle finger 07/02/2017  . Chronic shoulder bursitis, left 11/19/2016  . OA (osteoarthritis) of knee 11/18/2015  . Routine general medical examination at a health care facility 02/20/2015  . GERD 12/31/2007  . Osteopenia 10/31/2007  .  HYPERLIPIDEMIA 03/20/2007  . Essential hypertension 03/20/2007    Otelia Limes, PTA 12/28/2019, 9:16 AM  [place], [place], [address] Phone: [phone]   Fax:  [phone]  Name: Kim Fuller MRN: [id number] Date of Birth: 1945-07-30

## 2019-12-28 NOTE — Telephone Encounter (Signed)
Received after hours report from pt with complaint of fever 102.2 post Covid Booster injection.  Pt states over the weekend her fever has resolved and now is only experiencing a sore arm.  Per MD these are expected symptoms of the Covid injection.  Pt educated to apply cold compress to arm and to rest.  Pt verbalized understanding and appreciative of the advice.

## 2020-01-07 NOTE — Progress Notes (Signed)
Patient Care Team: Hoyt Koch, MD as PCP - General (Internal Medicine) Mauro Kaufmann, RN as Oncology Nurse Navigator Rockwell Germany, RN as Oncology Nurse Navigator Donnie Mesa, MD as Consulting Physician (General Surgery) Nicholas Lose, MD as Consulting Physician (Hematology and Oncology) Kyung Rudd, MD as Consulting Physician (Radiation Oncology)  DIAGNOSIS:    ICD-10-CM   1. Malignant neoplasm of upper-outer quadrant of left breast in female, estrogen receptor negative ([place])  C50.412    Z17.1     SUMMARY OF ONCOLOGIC HISTORY: Oncology History  Malignant neoplasm of upper-outer quadrant of left breast in female, estrogen receptor negative ([place])  09/08/2019 Initial Diagnosis   Screening mammogram detected a left breast asymmetry. Diagnostic mammogram and US showed a 0.5cm mass, 2 o'clock position, with no axillary adenopathy. Biopsy showed IDC, grade 3, HER-2 - (0), ER/PR -, Ki67 50%.   09/09/2019 Cancer Staging   Staging form: Breast, AJCC 8th Edition - Clinical stage from 09/09/2019: Stage IB (cT1a, cN0, cM0, G3, ER-, PR-, HER2-) - Signed by Nicholas Lose, MD on 09/09/2019   09/16/2019 Surgery   Left lumpectomy (Ts[facility]i): IDC, grade 3, 0.9cm, with high grade DCIS, 4 left axillary lymph nodes negative.    Radiation Therapy     09/16/2019 Cancer Staging   Staging form: Breast, AJCC 8th Edition - Pathologic stage from 09/16/2019: Stage IB (pT1b, pN0, cM0, G3, ER-, PR-, HER2-) - Signed by Gardenia Phlegm, NP on 10/07/2019   10/15/2019 -  Adjuvant Chemotherapy   CMF x 8     CHIEF COMPLIANT: Cycle5CMF  INTERVAL HISTORY: Kim Fuller is a 74 y.o. with above-mentioned history of triple negative left breastcancerwhounderwent a left lumpectomyand is currently on adjuvant chemotherapy with CMF. She presents to the clinic todayfor cycle5. She is tolerating the chemo extremely well.  She does not have any nausea or vomiting.  She received the third dose of  the COVID-19 vaccine and was sore for a day or 2.  She also had a slight temperature after the vaccine.  Denies any nausea vomiting denies any diarrhea or constipation.  ALLERGIES:  is allergic to sulfonamide derivatives and tramadol hcl.  MEDICATIONS:  Current Outpatient Medications  Medication Sig Dispense Refill  . acetaminophen (TYLENOL) 500 MG tablet Take 500-1,000 mg by mouth every 6 (six) hours as needed (for pain.).    Marland Kitchen aspirin 81 MG chewable tablet Chew 81 mg by mouth at bedtime.     . benazepril (LOTENSIN) 40 MG tablet Take 0.5 tablets (20 mg total) by mouth daily. 45 tablet 3  . bisoprolol-hydrochlorothiazide (ZIAC) 5-6.25 MG tablet Take 1 tablet by mouth daily. 90 tablet 3  . calcium carbonate (OSCAL) 1500 (600 Ca) MG TABS tablet Take 600 mg of elemental calcium by mouth 2 (two) times daily with a meal.    . cholecalciferol (VITAMIN D3) 10 MCG (400 UNIT) TABS tablet Take 400 Units by mouth at bedtime.     Marland Kitchen esomeprazole (NEXIUM) 20 MG capsule Take 20 mg by mouth daily as needed (acid reflux/indigestion.).    Marland Kitchen fluticasone (FLONASE) 50 MCG/ACT nasal spray USE TWO SPRAY(S) IN EACH NOSTRIL DAILY 48 g 6  . ibuprofen (ADVIL) 200 MG tablet Take 400 mg by mouth every 8 (eight) hours as needed (pain.).     Marland Kitchen lidocaine-prilocaine (EMLA) cream Apply to affected area once 30 g 3  . Multiple Vitamin (MULTIVITAMIN WITH MINERALS) TABS tablet Take 1 tablet by mouth daily. Centrum Silver    . nystatin (MYCOSTATIN) 100000  UNIT/ML suspension Take 5 mLs (500,000 Units total) by mouth 4 (four) times daily. 60 mL 1  . Omega-3 Fatty Acids (FISH OIL) 1000 MG CAPS Take 1,000 mg by mouth 2 (two) times daily.    . ondansetron (ZOFRAN) 8 MG tablet Take 1 tablet (8 mg total) by mouth 2 (two) times daily as needed for refractory nausea / vomiting. Start on day 3 after chemotherapy. 30 tablet 1  . pravastatin (PRAVACHOL) 20 MG tablet Take 1 tablet (20 mg total) by mouth daily. (Patient taking differently: Take  20 mg by mouth every evening. ) 90 tablet 3  . prochlorperazine (COMPAZINE) 10 MG tablet Take 1 tablet (10 mg total) by mouth every 6 (six) hours as needed (Nausea or vomiting). 30 tablet 1  . Propylene Glycol (SYSTANE BALANCE) 0.6 % SOLN Place 1 drop 3 (three) times daily as needed into both eyes (dry eyes).    . vitamin E 400 UNIT capsule Take 400 Units by mouth daily.     No current facility-administered medications for this visit.   Facility-Administered Medications Ordered in Other Visits  Medication Dose Route Frequency Provider Last Rate Last Admin  . sodium chloride flush (NS) 0.9 % injection 10 mL  10 mL Intracatheter PRN Nicholas Lose, MD   10 mL at 12/17/19 1604    PHYSICAL EXAMINATION: ECOG PERFORMANCE STATUS: 1 - Symptomatic but completely ambulatory  Vitals:   01/08/20 1146  BP: (!) 125/58  Pulse: (!) 52  Resp: 18  Temp: (!) 97 F (36.1 C)  SpO2: 97%   Filed Weights   01/08/20 1146  Weight: 214 lb 3.2 oz (97.2 kg)    LABORATORY DATA:  I have reviewed the data as listed CMP Latest Ref Rng & Units 12/17/2019 11/26/2019 11/05/2019  Glucose 70 - 99 mg/dL 90 90 88  BUN 8 - 23 mg/dL _0 Creatinine 0.44 - 1.00 mg/dL 0.77 0.77 0.78  Sodium 135 - 145 mmol/L 142 142 142  Potassium 3.5 - 5.1 mmol/L 3.9 3.8 3.9  Chloride 98 - 111 mmol/L 105 104 102  CO2 22 - 32 mmol/L _1 Calcium 8.9 - 10.3 mg/dL 10.4(H) 10.1 10.1  Total Protein 6.5 - 8.1 g/dL 7.1 7.1 7.3  Total Bilirubin 0.3 - 1.2 mg/dL 0.5 0.5 0.5  Alkaline Phos 38 - 126 U/L 61 62 64  AST 15 - 41 U/L _2 ALT 0 - 44 U/L _3 Lab Results  Component Value Date   WBC 4.5 01/08/2020   HGB 12.7 01/08/2020   HCT 39.4 01/08/2020   MCV 99.2 01/08/2020   PLT 255 01/08/2020   NEUTROABS 2.2 01/08/2020    ASSESSMENT & PLAN:  Malignant neoplasm of upper-outer quadrant of left breast in female, estrogen receptor negative ([place]) 09/16/2018:Left lumpectomy (Tsuei): IDC, grade 3, 0.9cm, with high grade  DCIS, 4 left axillary lymph nodes negative.Triple negative with a Ki-67 of 50%  Treatment plan: 1.Adjuvant chemotherapy with CMF 2.Adjuvant radiation therapy ------------------------------------------------------------------------------------------------------------------------------------------------------------ Current Treatment: Cycle5CMF Labs reviewed Chemo toxicities: 1. ANC 2.2 2.  bowels have normalized.  I recommended she continue with healthy diet and exercise. Return to clinic in 3 weeks for cycle 6    No orders of the defined types were placed in this encounter.  The patient has a good understanding of the overall plan. she agrees with it. she will call with any problems that may develop before the next visit here.  Total time spent: 30  mins including face to face time and time spent for planning, charting and coordination of care  Nicholas Lose, MD 01/08/2020  I, Cloyde Reams Dorshimer, am acting as scribe for Dr. Nicholas Lose.  I have reviewed the above documentation for accuracy and completeness, and I agree with the above.

## 2020-01-08 ENCOUNTER — Encounter: Payer: Self-pay | Admitting: *Deleted

## 2020-01-08 ENCOUNTER — Inpatient Hospital Stay (HOSPITAL_BASED_OUTPATIENT_CLINIC_OR_DEPARTMENT_OTHER): Payer: Medicare Other | Admitting: Hematology and Oncology

## 2020-01-08 ENCOUNTER — Inpatient Hospital Stay: Payer: Medicare Other | Attending: Hematology and Oncology

## 2020-01-08 ENCOUNTER — Inpatient Hospital Stay: Payer: Medicare Other

## 2020-01-08 ENCOUNTER — Other Ambulatory Visit: Payer: Self-pay

## 2020-01-08 DIAGNOSIS — C50412 Malignant neoplasm of upper-outer quadrant of left female breast: Secondary | ICD-10-CM | POA: Insufficient documentation

## 2020-01-08 DIAGNOSIS — Z5111 Encounter for antineoplastic chemotherapy: Secondary | ICD-10-CM | POA: Insufficient documentation

## 2020-01-08 DIAGNOSIS — Z5189 Encounter for other specified aftercare: Secondary | ICD-10-CM | POA: Insufficient documentation

## 2020-01-08 DIAGNOSIS — Z171 Estrogen receptor negative status [ER-]: Secondary | ICD-10-CM | POA: Insufficient documentation

## 2020-01-08 DIAGNOSIS — Z7982 Long term (current) use of aspirin: Secondary | ICD-10-CM | POA: Diagnosis not present

## 2020-01-08 DIAGNOSIS — Z79899 Other long term (current) drug therapy: Secondary | ICD-10-CM | POA: Insufficient documentation

## 2020-01-08 DIAGNOSIS — Z923 Personal history of irradiation: Secondary | ICD-10-CM | POA: Insufficient documentation

## 2020-01-08 DIAGNOSIS — R21 Rash and other nonspecific skin eruption: Secondary | ICD-10-CM | POA: Insufficient documentation

## 2020-01-08 DIAGNOSIS — R5383 Other fatigue: Secondary | ICD-10-CM | POA: Diagnosis not present

## 2020-01-08 DIAGNOSIS — Z95828 Presence of other vascular implants and grafts: Secondary | ICD-10-CM

## 2020-01-08 LAB — CMP (CANCER CENTER ONLY)
ALT: 15 U/L (ref 0–44)
AST: 20 U/L (ref 15–41)
Albumin: 3.4 g/dL — ABNORMAL LOW (ref 3.5–5.0)
Alkaline Phosphatase: 61 U/L (ref 38–126)
Anion gap: 8 (ref 5–15)
BUN: 11 mg/dL (ref 8–23)
CO2: 29 mmol/L (ref 22–32)
Calcium: 9.5 mg/dL (ref 8.9–10.3)
Chloride: 104 mmol/L (ref 98–111)
Creatinine: 0.67 mg/dL (ref 0.44–1.00)
GFR, Est AFR Am: >60 mL/min (ref >60)
GFR, Estimated: >60 mL/min (ref >60)
Glucose, Bld: 86 mg/dL (ref 70–99)
Potassium: 3.7 mmol/L (ref 3.5–5.1)
Sodium: 141 mmol/L (ref 135–145)
Total Bilirubin: 0.5 mg/dL (ref 0.3–1.2)
Total Protein: 7 g/dL (ref 6.5–8.1)

## 2020-01-08 LAB — CBC WITH DIFFERENTIAL (CANCER CENTER ONLY)
Abs Immature Granulocytes: 0.01 10*3/uL (ref 0.00–0.07)
Basophils Absolute: 0.1 10*3/uL (ref 0.0–0.1)
Basophils Relative: 2 %
Eosinophils Absolute: 0.1 10*3/uL (ref 0.0–0.5)
Eosinophils Relative: 3 %
HCT: 39.4 % (ref 36.0–46.0)
Hemoglobin: 12.7 g/dL (ref 12.0–15.0)
Immature Granulocytes: 0 %
Lymphocytes Relative: 28 %
Lymphs Abs: 1.3 10*3/uL (ref 0.7–4.0)
MCH: 32 pg (ref 26.0–34.0)
MCHC: 32.2 g/dL (ref 30.0–36.0)
MCV: 99.2 fL (ref 80.0–100.0)
Monocytes Absolute: 0.8 10*3/uL (ref 0.1–1.0)
Monocytes Relative: 18 %
Neutro Abs: 2.2 10*3/uL (ref 1.7–7.7)
Neutrophils Relative %: 49 %
Platelet Count: 255 10*3/uL (ref 150–400)
RBC: 3.97 MIL/uL (ref 3.87–5.11)
RDW: 14.1 % (ref 11.5–15.5)
WBC Count: 4.5 10*3/uL (ref 4.0–10.5)
nRBC: 0 % (ref 0.0–0.2)

## 2020-01-08 MED ORDER — SODIUM CHLORIDE 0.9 % IV SOLN
Freq: Once | INTRAVENOUS | Status: AC
  Filled 2020-01-08: qty 250

## 2020-01-08 MED ORDER — SODIUM CHLORIDE 0.9 % IV SOLN
10.0000 mg | Freq: Once | INTRAVENOUS | Status: AC
  Administered 2020-01-08: 10 mg via INTRAVENOUS
  Filled 2020-01-08: qty 10

## 2020-01-08 MED ORDER — METHOTREXATE SODIUM (PF) CHEMO INJECTION 250 MG/10ML
30.0000 mg/m2 | Freq: Once | INTRAMUSCULAR | Status: AC
  Administered 2020-01-08: 62 mg via INTRAVENOUS
  Filled 2020-01-08: qty 2.48

## 2020-01-08 MED ORDER — SODIUM CHLORIDE 0.9% FLUSH
10.0000 mL | INTRAVENOUS | Status: DC | PRN
  Administered 2020-01-08: 10 mL
  Filled 2020-01-08: qty 10

## 2020-01-08 MED ORDER — FLUOROURACIL CHEMO INJECTION 2.5 GM/50ML
400.0000 mg/m2 | Freq: Once | INTRAVENOUS | Status: AC
  Administered 2020-01-08: 850 mg via INTRAVENOUS
  Filled 2020-01-08: qty 17

## 2020-01-08 MED ORDER — PALONOSETRON HCL INJECTION 0.25 MG/5ML
0.2500 mg | Freq: Once | INTRAVENOUS | Status: AC
  Administered 2020-01-08: 0.25 mg via INTRAVENOUS

## 2020-01-08 MED ORDER — HEPARIN SOD (PORK) LOCK FLUSH 100 UNIT/ML IV SOLN
500.0000 [IU] | Freq: Once | INTRAVENOUS | Status: AC | PRN
  Administered 2020-01-08: 500 [IU]
  Filled 2020-01-08: qty 5

## 2020-01-08 MED ORDER — PALONOSETRON HCL INJECTION 0.25 MG/5ML
INTRAVENOUS | Status: AC
  Filled 2020-01-08: qty 5

## 2020-01-08 MED ORDER — SODIUM CHLORIDE 0.9% FLUSH
10.0000 mL | Freq: Once | INTRAVENOUS | Status: AC
  Administered 2020-01-08: 10 mL
  Filled 2020-01-08: qty 10

## 2020-01-08 MED ORDER — SODIUM CHLORIDE 0.9 % IV SOLN
400.0000 mg/m2 | Freq: Once | INTRAVENOUS | Status: AC
  Administered 2020-01-08: 820 mg via INTRAVENOUS
  Filled 2020-01-08: qty 41

## 2020-01-08 NOTE — Assessment & Plan Note (Signed)
09/16/2018:Left lumpectomy (Ts[facility]i): IDC, grade 3, 0.9cm, with high grade DCIS, 4 left axillary lymph nodes negative.Triple negative with a Ki-67 of 50%  Treatment plan: 1.Adjuvant chemotherapy with CMF 2.Adjuvant radiation therapy ------------------------------------------------------------------------------------------------------------------------------------------------------------ Current Treatment: Cycle5CMF Labs reviewed Chemo toxicities: 1. [place] 1 : Okay to treat.  I lowered the dosage of cyclophosphamide and 5-FU today. We will plan to administer Neulasta 2. constipation: Alternating with diarrhea  I recommended Kim Fuller continue with healthy diet and exercise. Return to clinic in 3 weeks for cycle 6

## 2020-01-08 NOTE — Patient Instructions (Signed)
[facility] Discharge Instructions for Patients Receiving Chemotherapy  Today you received the following chemotherapy agents: Cytoxan, Methotrexate, and Fluorouracil  To help prevent nausea and vomiting after your treatment, we encourage you to take your nausea medication as directed by your MD.   If you develop nausea and vomiting that is not controlled by your nausea medication, call the clinic.   BELOW ARE SYMPTOMS THAT SHOULD BE REPORTED IMMEDIATELY:  *FEVER GREATER THAN 100.5 F  *CHILLS WITH OR WITHOUT FEVER  NAUSEA AND VOMITING THAT IS NOT CONTROLLED WITH YOUR NAUSEA MEDICATION  *UNUSUAL SHORTNESS OF BREATH  *UNUSUAL BRUISING OR BLEEDING  TENDERNESS IN MOUTH AND THROAT WITH OR WITHOUT PRESENCE OF ULCERS  *URINARY PROBLEMS  *BOWEL PROBLEMS  UNUSUAL RASH Items with * indicate a potential emergency and should be followed up as soon as possible.  Feel free to call the clinic should you have any questions or concerns. The clinic phone number is [phone].  Please show the [facility] at check-in to the Emergency Department and triage nurse.

## 2020-01-09 ENCOUNTER — Inpatient Hospital Stay: Payer: Medicare Other

## 2020-01-09 VITALS — BP 105/67 | HR 63 | Temp 97.6°F | Resp 18

## 2020-01-09 DIAGNOSIS — Z5111 Encounter for antineoplastic chemotherapy: Secondary | ICD-10-CM | POA: Diagnosis not present

## 2020-01-09 DIAGNOSIS — R5383 Other fatigue: Secondary | ICD-10-CM | POA: Diagnosis not present

## 2020-01-09 DIAGNOSIS — Z171 Estrogen receptor negative status [ER-]: Secondary | ICD-10-CM | POA: Diagnosis not present

## 2020-01-09 DIAGNOSIS — Z5189 Encounter for other specified aftercare: Secondary | ICD-10-CM | POA: Diagnosis not present

## 2020-01-09 DIAGNOSIS — C50412 Malignant neoplasm of upper-outer quadrant of left female breast: Secondary | ICD-10-CM | POA: Diagnosis not present

## 2020-01-09 DIAGNOSIS — R21 Rash and other nonspecific skin eruption: Secondary | ICD-10-CM | POA: Diagnosis not present

## 2020-01-09 MED ORDER — PEGFILGRASTIM-CBQV 6 MG/0.6ML ~~LOC~~ SOSY
6.0000 mg | PREFILLED_SYRINGE | Freq: Once | SUBCUTANEOUS | Status: AC
  Administered 2020-01-09: 6 mg via SUBCUTANEOUS

## 2020-01-09 NOTE — Patient Instructions (Signed)

## 2020-01-13 ENCOUNTER — Telehealth: Payer: Self-pay | Admitting: Hematology and Oncology

## 2020-01-13 NOTE — Telephone Encounter (Signed)
No 9/3 los, no changes made to pt schedule

## 2020-01-18 DIAGNOSIS — C50912 Malignant neoplasm of unspecified site of left female breast: Secondary | ICD-10-CM | POA: Diagnosis not present

## 2020-01-28 NOTE — Progress Notes (Signed)
Patient Care Team: Hoyt Koch, MD as PCP - General (Internal Medicine) Mauro Kaufmann, RN as Oncology Nurse Navigator Rockwell Germany, RN as Oncology Nurse Navigator Donnie Mesa, MD as Consulting Physician (General Surgery) Nicholas Lose, MD as Consulting Physician (Hematology and Oncology) Kyung Rudd, MD as Consulting Physician (Radiation Oncology)  DIAGNOSIS:    ICD-10-CM   1. Malignant neoplasm of upper-outer quadrant of left breast in female, estrogen receptor negative ([place])  C50.412    Z17.1     SUMMARY OF ONCOLOGIC HISTORY: Oncology History  Malignant neoplasm of upper-outer quadrant of left breast in female, estrogen receptor negative ([place])  09/08/2019 Initial Diagnosis   Screening mammogram detected a left breast asymmetry. Diagnostic mammogram and US showed a 0.5cm mass, 2 o'clock position, with no axillary adenopathy. Biopsy showed IDC, grade 3, HER-2 - (0), ER/PR -, Ki67 50%.   09/09/2019 Cancer Staging   Staging form: Breast, AJCC 8th Edition - Clinical stage from 09/09/2019: Stage IB (cT1a, cN0, cM0, G3, ER-, PR-, HER2-) - Signed by Nicholas Lose, MD on 09/09/2019   09/16/2019 Surgery   Left lumpectomy ([facility]): IDC, grade 3, 0.9cm, with high grade DCIS, 4 left axillary lymph nodes negative.    Radiation Therapy     09/16/2019 Cancer Staging   Staging form: Breast, AJCC 8th Edition - Pathologic stage from 09/16/2019: Stage IB (pT1b, pN0, cM0, G3, ER-, PR-, HER2-) - Signed by Gardenia Phlegm, NP on 10/07/2019   10/15/2019 -  Adjuvant Chemotherapy   CMF x 8     CHIEF COMPLIANT: Cycle6CMF  INTERVAL HISTORY: Kim Fuller is a 74 y.o. with above-mentioned history of triple negative left breastcancerwhounderwent a left lumpectomyand is currently on adjuvant chemotherapy with CMF. She presents to the clinic todayfor cycle6. She had a bit of a rash after the last cycle of chemo on her face which resolved after a day or 2.  She also felt  fatigued for couple of days and it has resolved as well.  Denies any nausea or vomiting.  She has been feeling remarkably well.  ALLERGIES:  is allergic to sulfonamide derivatives and tramadol hcl.  MEDICATIONS:  Current Outpatient Medications  Medication Sig Dispense Refill  . acetaminophen (TYLENOL) 500 MG tablet Take 500-1,000 mg by mouth every 6 (six) hours as needed (for pain.).    Marland Kitchen aspirin 81 MG chewable tablet Chew 81 mg by mouth at bedtime.     . benazepril (LOTENSIN) 40 MG tablet Take 0.5 tablets (20 mg total) by mouth daily. 45 tablet 3  . bisoprolol-hydrochlorothiazide (ZIAC) 5-6.25 MG tablet Take 1 tablet by mouth daily. 90 tablet 3  . calcium carbonate (OSCAL) 1500 (600 Ca) MG TABS tablet Take 600 mg of elemental calcium by mouth 2 (two) times daily with a meal.    . cholecalciferol (VITAMIN D3) 10 MCG (400 UNIT) TABS tablet Take 400 Units by mouth at bedtime.     Marland Kitchen esomeprazole (NEXIUM) 20 MG capsule Take 20 mg by mouth daily as needed (acid reflux/indigestion.).    Marland Kitchen fluticasone (FLONASE) 50 MCG/ACT nasal spray USE TWO SPRAY(S) IN EACH NOSTRIL DAILY 48 g 6  . ibuprofen (ADVIL) 200 MG tablet Take 400 mg by mouth every 8 (eight) hours as needed (pain.).     Marland Kitchen lidocaine-prilocaine (EMLA) cream Apply to affected area once 30 g 3  . Multiple Vitamin (MULTIVITAMIN WITH MINERALS) TABS tablet Take 1 tablet by mouth daily. Centrum Silver    . nystatin (MYCOSTATIN) 100000 UNIT/ML suspension Take  5 mLs (500,000 Units total) by mouth 4 (four) times daily. 60 mL 1  . Omega-3 Fatty Acids (FISH OIL) 1000 MG CAPS Take 1,000 mg by mouth 2 (two) times daily.    . ondansetron (ZOFRAN) 8 MG tablet Take 1 tablet (8 mg total) by mouth 2 (two) times daily as needed for refractory nausea / vomiting. Start on day 3 after chemotherapy. 30 tablet 1  . pravastatin (PRAVACHOL) 20 MG tablet Take 1 tablet (20 mg total) by mouth daily. (Patient taking differently: Take 20 mg by mouth every evening. ) 90 tablet  3  . prochlorperazine (COMPAZINE) 10 MG tablet Take 1 tablet (10 mg total) by mouth every 6 (six) hours as needed (Nausea or vomiting). 30 tablet 1  . Propylene Glycol (SYSTANE BALANCE) 0.6 % SOLN Place 1 drop 3 (three) times daily as needed into both eyes (dry eyes).    . vitamin E 400 UNIT capsule Take 400 Units by mouth daily.     No current facility-administered medications for this visit.   Facility-Administered Medications Ordered in Other Visits  Medication Dose Route Frequency Provider Last Rate Last Admin  . sodium chloride flush (NS) 0.9 % injection 10 mL  10 mL Intracatheter PRN Nicholas Lose, MD   10 mL at 12/17/19 1604    PHYSICAL EXAMINATION: ECOG PERFORMANCE STATUS: 1 - Symptomatic but completely ambulatory  Vitals:   01/29/20 1156  BP: 133/74  Pulse: (!) 59  Resp: 18  Temp: 98.2 F (36.8 C)  SpO2: 96%   Filed Weights   01/29/20 1156  Weight: 215 lb 3.2 oz (97.6 kg)    LABORATORY DATA:  I have reviewed the data as listed CMP Latest Ref Rng & Units 01/08/2020 12/17/2019 11/26/2019  Glucose 70 - 99 mg/dL 86 90 90  BUN 8 - 23 mg/dL _0 Creatinine 0.44 - 1.00 mg/dL 0.67 0.77 0.77  Sodium 135 - 145 mmol/L 141 142 142  Potassium 3.5 - 5.1 mmol/L 3.7 3.9 3.8  Chloride 98 - 111 mmol/L 104 105 104  CO2 22 - 32 mmol/L _1 Calcium 8.9 - 10.3 mg/dL 9.5 10.4(H) 10.1  Total Protein 6.5 - 8.1 g/dL 7.0 7.1 7.1  Total Bilirubin 0.3 - 1.2 mg/dL 0.5 0.5 0.5  Alkaline Phos 38 - 126 U/L 61 61 62  AST 15 - 41 U/L _2 ALT 0 - 44 U/L _3 Lab Results  Component Value Date   WBC 6.0 01/29/2020   HGB 13.2 01/29/2020   HCT 40.5 01/29/2020   MCV 101.3 (H) 01/29/2020   PLT 180 01/29/2020   NEUTROABS 3.5 01/29/2020    ASSESSMENT & PLAN:  Malignant neoplasm of upper-outer quadrant of left breast in female, estrogen receptor negative ([place]) 09/16/2018:Left lumpectomy ([facility]): IDC, grade 3, 0.9cm, with high grade DCIS, 4 left axillary lymph nodes  negative.Triple negative with a Ki-67 of 50%  Treatment plan: 1.Adjuvant chemotherapy with CMF 2.Adjuvant radiation therapy ------------------------------------------------------------------------------------------------------------------------------------------------------------ Current Treatment: Cycle6CMF Labs reviewed  Chemo toxicities: Denies any major side effects to chemotherapy. Facial rash after last cycle of chemo which resolved after 1 to 2 days. Mild to moderate fatigue.  I will request Dr. Georgette Dover to remove her port at his convenience. I recommended she continue with healthy diet and exercise. Return to clinic after radiation is complete.    No orders of the defined types were placed in this encounter.  The patient has a good understanding of the overall  plan. she agrees with it. she will call with any problems that may develop before the next visit here.  Total time spent: 30 mins including face to face time and time spent for planning, charting and coordination of care  Nicholas Lose, MD 01/29/2020  I, Cloyde Reams Dorshimer, am acting as scribe for Dr. Nicholas Lose.  I have reviewed the above documentation for accuracy and completeness, and I agree with the above.

## 2020-01-29 ENCOUNTER — Inpatient Hospital Stay: Payer: Medicare Other

## 2020-01-29 ENCOUNTER — Encounter: Payer: Self-pay | Admitting: *Deleted

## 2020-01-29 ENCOUNTER — Ambulatory Visit: Payer: Self-pay | Admitting: Surgery

## 2020-01-29 ENCOUNTER — Other Ambulatory Visit: Payer: Self-pay

## 2020-01-29 ENCOUNTER — Inpatient Hospital Stay (HOSPITAL_BASED_OUTPATIENT_CLINIC_OR_DEPARTMENT_OTHER): Payer: Medicare Other | Admitting: Hematology and Oncology

## 2020-01-29 DIAGNOSIS — Z5189 Encounter for other specified aftercare: Secondary | ICD-10-CM | POA: Diagnosis not present

## 2020-01-29 DIAGNOSIS — C50412 Malignant neoplasm of upper-outer quadrant of left female breast: Secondary | ICD-10-CM

## 2020-01-29 DIAGNOSIS — Z95828 Presence of other vascular implants and grafts: Secondary | ICD-10-CM

## 2020-01-29 DIAGNOSIS — Z5111 Encounter for antineoplastic chemotherapy: Secondary | ICD-10-CM | POA: Diagnosis not present

## 2020-01-29 DIAGNOSIS — R5383 Other fatigue: Secondary | ICD-10-CM | POA: Diagnosis not present

## 2020-01-29 DIAGNOSIS — Z171 Estrogen receptor negative status [ER-]: Secondary | ICD-10-CM

## 2020-01-29 DIAGNOSIS — R21 Rash and other nonspecific skin eruption: Secondary | ICD-10-CM | POA: Diagnosis not present

## 2020-01-29 LAB — CMP (CANCER CENTER ONLY)
ALT: 20 U/L (ref 0–44)
AST: 23 U/L (ref 15–41)
Albumin: 3.3 g/dL — ABNORMAL LOW (ref 3.5–5.0)
Alkaline Phosphatase: 63 U/L (ref 38–126)
Anion gap: 5 (ref 5–15)
BUN: 12 mg/dL (ref 8–23)
CO2: 29 mmol/L (ref 22–32)
Calcium: 9.1 mg/dL (ref 8.9–10.3)
Chloride: 105 mmol/L (ref 98–111)
Creatinine: 0.72 mg/dL (ref 0.44–1.00)
GFR, Est AFR Am: >60 mL/min (ref >60)
GFR, Estimated: >60 mL/min (ref >60)
Glucose, Bld: 95 mg/dL (ref 70–99)
Potassium: 3.7 mmol/L (ref 3.5–5.1)
Sodium: 139 mmol/L (ref 135–145)
Total Bilirubin: 0.5 mg/dL (ref 0.3–1.2)
Total Protein: 7.2 g/dL (ref 6.5–8.1)

## 2020-01-29 LAB — CBC WITH DIFFERENTIAL (CANCER CENTER ONLY)
Abs Immature Granulocytes: 0.03 10*3/uL (ref 0.00–0.07)
Basophils Absolute: 0.1 10*3/uL (ref 0.0–0.1)
Basophils Relative: 1 %
Eosinophils Absolute: 0.1 10*3/uL (ref 0.0–0.5)
Eosinophils Relative: 2 %
HCT: 40.5 % (ref 36.0–46.0)
Hemoglobin: 13.2 g/dL (ref 12.0–15.0)
Immature Granulocytes: 1 %
Lymphocytes Relative: 24 %
Lymphs Abs: 1.5 10*3/uL (ref 0.7–4.0)
MCH: 33 pg (ref 26.0–34.0)
MCHC: 32.6 g/dL (ref 30.0–36.0)
MCV: 101.3 fL — ABNORMAL HIGH (ref 80.0–100.0)
Monocytes Absolute: 0.9 10*3/uL (ref 0.1–1.0)
Monocytes Relative: 14 %
Neutro Abs: 3.5 10*3/uL (ref 1.7–7.7)
Neutrophils Relative %: 58 %
Platelet Count: 180 10*3/uL (ref 150–400)
RBC: 4 MIL/uL (ref 3.87–5.11)
RDW: 13.8 % (ref 11.5–15.5)
WBC Count: 6 10*3/uL (ref 4.0–10.5)
nRBC: 0 % (ref 0.0–0.2)

## 2020-01-29 MED ORDER — PALONOSETRON HCL INJECTION 0.25 MG/5ML
0.2500 mg | Freq: Once | INTRAVENOUS | Status: AC
  Administered 2020-01-29: 0.25 mg via INTRAVENOUS

## 2020-01-29 MED ORDER — HEPARIN SOD (PORK) LOCK FLUSH 100 UNIT/ML IV SOLN
500.0000 [IU] | Freq: Once | INTRAVENOUS | Status: AC | PRN
  Administered 2020-01-29: 500 [IU]
  Filled 2020-01-29: qty 5

## 2020-01-29 MED ORDER — METHOTREXATE SODIUM (PF) CHEMO INJECTION 250 MG/10ML
30.0000 mg/m2 | Freq: Once | INTRAMUSCULAR | Status: AC
  Administered 2020-01-29: 62 mg via INTRAVENOUS
  Filled 2020-01-29: qty 2.48

## 2020-01-29 MED ORDER — PALONOSETRON HCL INJECTION 0.25 MG/5ML
INTRAVENOUS | Status: AC
  Filled 2020-01-29: qty 5

## 2020-01-29 MED ORDER — FLUOROURACIL CHEMO INJECTION 2.5 GM/50ML
400.0000 mg/m2 | Freq: Once | INTRAVENOUS | Status: AC
  Administered 2020-01-29: 850 mg via INTRAVENOUS
  Filled 2020-01-29: qty 17

## 2020-01-29 MED ORDER — SODIUM CHLORIDE 0.9 % IV SOLN
10.0000 mg | Freq: Once | INTRAVENOUS | Status: AC
  Administered 2020-01-29: 10 mg via INTRAVENOUS
  Filled 2020-01-29: qty 10

## 2020-01-29 MED ORDER — SODIUM CHLORIDE 0.9 % IV SOLN
400.0000 mg/m2 | Freq: Once | INTRAVENOUS | Status: AC
  Administered 2020-01-29: 820 mg via INTRAVENOUS
  Filled 2020-01-29: qty 41

## 2020-01-29 MED ORDER — SODIUM CHLORIDE 0.9% FLUSH
10.0000 mL | Freq: Once | INTRAVENOUS | Status: AC
  Administered 2020-01-29: 10 mL
  Filled 2020-01-29: qty 10

## 2020-01-29 MED ORDER — SODIUM CHLORIDE 0.9 % IV SOLN
Freq: Once | INTRAVENOUS | Status: AC
  Filled 2020-01-29: qty 250

## 2020-01-29 MED ORDER — SODIUM CHLORIDE 0.9% FLUSH
10.0000 mL | INTRAVENOUS | Status: DC | PRN
  Administered 2020-01-29: 10 mL
  Filled 2020-01-29: qty 10

## 2020-01-29 NOTE — Assessment & Plan Note (Signed)
09/16/2018:Left lumpectomy ([facility]): IDC, grade 3, 0.9cm, with high grade DCIS, 4 left axillary lymph nodes negative.Triple negative with a Ki-67 of 50%  Treatment plan: 1.Adjuvant chemotherapy with CMF 2.Adjuvant radiation therapy ------------------------------------------------------------------------------------------------------------------------------------------------------------ Current Treatment: Cycle6CMF Labs reviewed  Chemo toxicities: 1.ANC 2.2 2. bowels have normalized.  I recommended she continue with healthy diet and exercise. Return to clinic after radiation is complete.

## 2020-01-29 NOTE — Patient Instructions (Signed)

## 2020-01-29 NOTE — Patient Instructions (Signed)
[facility] Discharge Instructions for Patients Receiving Chemotherapy  Today you received the following chemotherapy agents: Cytoxan, Methotrexate, and Fluorouracil  To help prevent nausea and vomiting after your treatment, we encourage you to take your nausea medication as directed by your MD.   If you develop nausea and vomiting that is not controlled by your nausea medication, call the clinic.   BELOW ARE SYMPTOMS THAT SHOULD BE REPORTED IMMEDIATELY:  *FEVER GREATER THAN 100.5 F  *CHILLS WITH OR WITHOUT FEVER  NAUSEA AND VOMITING THAT IS NOT CONTROLLED WITH YOUR NAUSEA MEDICATION  *UNUSUAL SHORTNESS OF BREATH  *UNUSUAL BRUISING OR BLEEDING  TENDERNESS IN MOUTH AND THROAT WITH OR WITHOUT PRESENCE OF ULCERS  *URINARY PROBLEMS  *BOWEL PROBLEMS  UNUSUAL RASH Items with * indicate a potential emergency and should be followed up as soon as possible.  Feel free to call the clinic should you have any questions or concerns. The clinic phone number is [phone].  Please show the [facility] at check-in to the Emergency Department and triage nurse.

## 2020-01-30 ENCOUNTER — Inpatient Hospital Stay: Payer: Medicare Other

## 2020-02-01 ENCOUNTER — Other Ambulatory Visit: Payer: Self-pay

## 2020-02-01 ENCOUNTER — Inpatient Hospital Stay: Payer: Medicare Other

## 2020-02-01 VITALS — BP 133/67 | HR 59 | Temp 98.7°F | Resp 17

## 2020-02-01 DIAGNOSIS — R5383 Other fatigue: Secondary | ICD-10-CM | POA: Diagnosis not present

## 2020-02-01 DIAGNOSIS — Z5111 Encounter for antineoplastic chemotherapy: Secondary | ICD-10-CM | POA: Diagnosis not present

## 2020-02-01 DIAGNOSIS — R21 Rash and other nonspecific skin eruption: Secondary | ICD-10-CM | POA: Diagnosis not present

## 2020-02-01 DIAGNOSIS — C50412 Malignant neoplasm of upper-outer quadrant of left female breast: Secondary | ICD-10-CM | POA: Diagnosis not present

## 2020-02-01 DIAGNOSIS — Z171 Estrogen receptor negative status [ER-]: Secondary | ICD-10-CM | POA: Diagnosis not present

## 2020-02-01 DIAGNOSIS — Z5189 Encounter for other specified aftercare: Secondary | ICD-10-CM | POA: Diagnosis not present

## 2020-02-01 MED ORDER — PEGFILGRASTIM-CBQV 6 MG/0.6ML ~~LOC~~ SOSY
PREFILLED_SYRINGE | SUBCUTANEOUS | Status: AC
  Filled 2020-02-01: qty 0.6

## 2020-02-01 MED ORDER — PEGFILGRASTIM-CBQV 6 MG/0.6ML ~~LOC~~ SOSY
6.0000 mg | PREFILLED_SYRINGE | Freq: Once | SUBCUTANEOUS | Status: AC
  Administered 2020-02-01: 6 mg via SUBCUTANEOUS

## 2020-02-01 NOTE — Patient Instructions (Signed)

## 2020-02-02 ENCOUNTER — Telehealth: Payer: Self-pay | Admitting: Hematology and Oncology

## 2020-02-02 NOTE — Telephone Encounter (Signed)
No 9/24 los. No changes made to pt's schedule.

## 2020-02-10 DIAGNOSIS — Z452 Encounter for adjustment and management of vascular access device: Secondary | ICD-10-CM | POA: Diagnosis not present

## 2020-02-10 DIAGNOSIS — Z853 Personal history of malignant neoplasm of breast: Secondary | ICD-10-CM | POA: Diagnosis not present

## 2020-02-16 ENCOUNTER — Other Ambulatory Visit: Payer: Self-pay

## 2020-02-16 ENCOUNTER — Ambulatory Visit
Admission: RE | Admit: 2020-02-16 | Discharge: 2020-02-16 | Disposition: A | Discharge status: Home / Self Care | Payer: Medicare Other | Source: Ambulatory Visit | Attending: Radiation Oncology | Admitting: Radiation Oncology

## 2020-02-16 ENCOUNTER — Encounter: Payer: Self-pay | Admitting: Radiation Oncology

## 2020-02-16 VITALS — BP 132/65 | HR 66 | Temp 98.3°F | Resp 18 | Wt 211.4 lb

## 2020-02-16 DIAGNOSIS — Z9221 Personal history of antineoplastic chemotherapy: Secondary | ICD-10-CM | POA: Diagnosis not present

## 2020-02-16 DIAGNOSIS — M8580 Other specified disorders of bone density and structure, unspecified site: Secondary | ICD-10-CM | POA: Diagnosis not present

## 2020-02-16 DIAGNOSIS — Z9889 Other specified postprocedural states: Secondary | ICD-10-CM | POA: Diagnosis not present

## 2020-02-16 DIAGNOSIS — C50412 Malignant neoplasm of upper-outer quadrant of left female breast: Secondary | ICD-10-CM

## 2020-02-16 DIAGNOSIS — Z87891 Personal history of nicotine dependence: Secondary | ICD-10-CM | POA: Insufficient documentation

## 2020-02-16 DIAGNOSIS — Z171 Estrogen receptor negative status [ER-]: Secondary | ICD-10-CM | POA: Diagnosis not present

## 2020-02-16 DIAGNOSIS — K219 Gastro-esophageal reflux disease without esophagitis: Secondary | ICD-10-CM | POA: Insufficient documentation

## 2020-02-16 DIAGNOSIS — Z7982 Long term (current) use of aspirin: Secondary | ICD-10-CM | POA: Diagnosis not present

## 2020-02-16 DIAGNOSIS — Z801 Family history of malignant neoplasm of trachea, bronchus and lung: Secondary | ICD-10-CM | POA: Insufficient documentation

## 2020-02-16 DIAGNOSIS — I10 Essential (primary) hypertension: Secondary | ICD-10-CM | POA: Insufficient documentation

## 2020-02-16 DIAGNOSIS — Z79899 Other long term (current) drug therapy: Secondary | ICD-10-CM | POA: Diagnosis not present

## 2020-02-16 DIAGNOSIS — E785 Hyperlipidemia, unspecified: Secondary | ICD-10-CM | POA: Insufficient documentation

## 2020-02-16 DIAGNOSIS — J45909 Unspecified asthma, uncomplicated: Secondary | ICD-10-CM | POA: Diagnosis not present

## 2020-02-16 NOTE — Progress Notes (Signed)
Radiation Oncology         [phone] ________________________________  Name: Kim Fuller        MRN: [id number]  Date of Service: 02/16/2020 DOB: [birth year]  EZ:[id number], Real Cons, MD  Nicholas Lose, MD     REFERRING PHYSICIAN: Nicholas Lose, MD   DIAGNOSIS: The encounter diagnosis was Malignant neoplasm of upper-outer quadrant of left breast in female, estrogen receptor negative ([place]).   HISTORY OF PRESENT ILLNESS: Kim Fuller is a 74 y.o. female seen in the multidisciplinary breast clinic for a new diagnosis of left breast cancer. The patient was noted to have a screening detected mass in the left breast. Diagnostic imaging revealed a mass in the upper outer quadrant at 2:00 measuring 5 x 4 x 4 mm, and her left axilla was negative for adenopathy. A biopsy of the breast on 09/01/19 revealed a grade 3 invasive ductal carcinoma that was triple negative with a Ki 67 of 50%. She subsequently underwent left lumpectomy with sentinel node biopsy on 09/16/2019 revealing a grade 3 invasive ductal carcinoma measuring 9 mm with associated high-grade DCIS, the invasive cancer was focally less than 1 mm to the inferior margin, DCIS was focally less than 1 mm to the inferior margin and lateral margin as well. 4 sentinel lymph nodes were sampled and none contained evidence of metastatic disease. She subsequently began adjuvant chemotherapy on 10/15/2019 and has completed 6 cycles on 01/29/2020. She is seen today to discuss adjuvant treatment options.   PREVIOUS RADIATION THERAPY: No   PAST MEDICAL HISTORY:  Past Medical History:  Diagnosis Date  . Abnormal finding on Pap smear    x1  . Diverticulosis   . GERD (gastroesophageal reflux disease)   . Hyperlipidemia   . Hypertension   . Osteopenia    -1.5 @ femoral neck 11/2007  . Reactive airway disease    years ago ; severe asthma attack "bornchitis asthma " per patient, no issues since        PAST SURGICAL HISTORY: Past Surgical  History:  Procedure Laterality Date  . BREAST LUMPECTOMY WITH RADIOACTIVE SEED AND SENTINEL LYMPH NODE BIOPSY Left 09/16/2019   Procedure: LEFT BREAST LUMPECTOMY WITH RADIOACTIVE SEED AND SENTINEL LYMPH NODE BIOPSY;  Surgeon: Donnie Mesa, MD;  Location: [facility];  Service: General;  Laterality: Left;  . CARDIOVASCULAR STRESS TEST  03/05/06  . CARPAL TUNNEL RELEASE  2009    bilaterally  . CATARACT EXTRACTION, BILATERAL     Dr Gershon Crane  . COLONOSCOPY W/ POLYPECTOMY  1998   Tics @ 2004 & 2012; Dr Carlean Purl  . HAND SURGERY     Trigger thumb  . PARTIAL KNEE ARTHROPLASTY Left 04/03/2017   Procedure: Left knee medial unicompartmental arthroplasty;  Surgeon: Gaynelle Arabian, MD;  Location: [facility];  Service: Orthopedics;  Laterality: Left;  Adductor canal block  . PORTACATH PLACEMENT N/A 09/29/2019   Procedure: INSERTION PORT-A-CATH;  Surgeon: Donnie Mesa, MD;  Location: [facility];  Service: General;  Laterality: N/A;  . TRIGGER FINGER RELEASE Right   . TUBAL LIGATION       FAMILY HISTORY:  Family History  Problem Relation Age of Onset  . Asthma Sister   . Hypertension Sister   . Hypertension Father   . Heart attack Father        MI > 98  . Prostate cancer Father   . Lung cancer Father   . Lung cancer Sister        smoker  .  Melanoma Sister   . Osteoporosis Sister   . Glaucoma Sister        X 2  . Diverticulosis Sister        2 sisters  . Lung cancer Sister   . Colon polyps Sister        2 sisters with polyps  . Diverticulitis Sister        1 sister  . Hypertension Mother   . Hypertension Brother   . Skin cancer Sister   . Diabetes Neg Hx   . Stroke Neg Hx   . Breast cancer Neg Hx      SOCIAL HISTORY:  reports that she quit smoking about 49 years ago. She has never used smokeless tobacco. She reports current alcohol use. She reports that she does not use drugs. The patient is married and lives in [place]. She is retired from working in a Environmental manager.    ALLERGIES: Sulfonamide derivatives and Tramadol hcl   MEDICATIONS:  Current Outpatient Medications  Medication Sig Dispense Refill  . acetaminophen (TYLENOL) 500 MG tablet Take 500-1,000 mg by mouth every 6 (six) hours as needed (for pain.).    Marland Kitchen aspirin 81 MG chewable tablet Chew 81 mg by mouth at bedtime.     . benazepril (LOTENSIN) 40 MG tablet Take 0.5 tablets (20 mg total) by mouth daily. 45 tablet 3  . bisoprolol-hydrochlorothiazide (ZIAC) 5-6.25 MG tablet Take 1 tablet by mouth daily. 90 tablet 3  . calcium carbonate (OSCAL) 1500 (600 Ca) MG TABS tablet Take 600 mg of elemental calcium by mouth 2 (two) times daily with a meal.    . cholecalciferol (VITAMIN D3) 10 MCG (400 UNIT) TABS tablet Take 400 Units by mouth at bedtime.     Marland Kitchen esomeprazole (NEXIUM) 20 MG capsule Take 20 mg by mouth daily as needed (acid reflux/indigestion.).    Marland Kitchen fluticasone (FLONASE) 50 MCG/ACT nasal spray USE TWO SPRAY(S) IN EACH NOSTRIL DAILY 48 g 6  . ibuprofen (ADVIL) 200 MG tablet Take 400 mg by mouth every 8 (eight) hours as needed (pain.).     Marland Kitchen lidocaine-prilocaine (EMLA) cream Apply to affected area once 30 g 3  . Multiple Vitamin (MULTIVITAMIN WITH MINERALS) TABS tablet Take 1 tablet by mouth daily. Centrum Silver    . Omega-3 Fatty Acids (FISH OIL) 1000 MG CAPS Take 1,000 mg by mouth 2 (two) times daily.    . ondansetron (ZOFRAN) 8 MG tablet Take 1 tablet (8 mg total) by mouth 2 (two) times daily as needed for refractory nausea / vomiting. Start on day 3 after chemotherapy. 30 tablet 1  . pravastatin (PRAVACHOL) 20 MG tablet Take 1 tablet (20 mg total) by mouth daily. (Patient taking differently: Take 20 mg by mouth every evening. ) 90 tablet 3  . prochlorperazine (COMPAZINE) 10 MG tablet Take 1 tablet (10 mg total) by mouth every 6 (six) hours as needed (Nausea or vomiting). 30 tablet 1  . Propylene Glycol (SYSTANE BALANCE) 0.6 % SOLN Place 1 drop 3 (three) times daily as needed into both  eyes (dry eyes).    . vitamin E 400 UNIT capsule Take 400 Units by mouth daily.     No current facility-administered medications for this encounter.   Facility-Administered Medications Ordered in Other Encounters  Medication Dose Route Frequency Provider Last Rate Last Admin  . sodium chloride flush (NS) 0.9 % injection 10 mL  10 mL Intracatheter PRN Nicholas Lose, MD   10 mL at 12/17/19 1604  REVIEW OF SYSTEMS: On review of systems, the patient reports that she is doing well overall. She is still tired from her prior chemo regimen but is glad that she was able to complete it. She does have some bruising in her right hand and PAC site since her PAC was removed last week from her right chest. She  denies any chest pain, shortness of breath, cough, fevers, chills, night sweats, unintended weight changes. She denies any bowel or bladder disturbances, and denies abdominal pain, nausea or vomiting. She denies any new musculoskeletal or joint aches or pains, new skin lesions or concerns. A complete review of systems is obtained and is otherwise negative.     PHYSICAL EXAM:  Wt Readings from Last 3 Encounters:  01/29/20 215 lb 3.2 oz (97.6 kg)  01/08/20 214 lb 3.2 oz (97.2 kg)  12/17/19 214 lb 6.4 oz (97.3 kg)   Temp Readings from Last 3 Encounters:  02/01/20 98.7 F (37.1 C) (Oral)  01/29/20 98.2 F (36.8 C) (Tympanic)  01/09/20 97.6 F (36.4 C) (Tympanic)   BP Readings from Last 3 Encounters:  02/01/20 133/67  01/29/20 133/74  01/09/20 105/67   Pulse Readings from Last 3 Encounters:  02/01/20 (!) 59  01/29/20 (!) 59  01/09/20 63    In general this is a well appearing caucasian female in no acute distress. She's alert and oriented x4 and appropriate throughout the examination. Cardiopulmonary assessment is negative for acute distress and she exhibits normal effort. Her left breast incision and axillary incision sites are well healed. Her right chest incision from prior PAC is  intact with minimal bruising and more notable bruising is seen at her prior IV site in her right hand.     ECOG = 0  0 - Asymptomatic (Fully active, able to carry on all predisease activities without restriction)  1 - Symptomatic but completely ambulatory (Restricted in physically strenuous activity but ambulatory and able to carry out work of a light or sedentary nature. For example, light housework, office work)  2 - Symptomatic, <50% in bed during the day (Ambulatory and capable of all self care but unable to carry out any work activities. Up and about more than 50% of waking hours)  3 - Symptomatic, >50% in bed, but not bedbound (Capable of only limited self-care, confined to bed or chair 50% or more of waking hours)  4 - Bedbound (Completely disabled. Cannot carry on any self-care. Totally confined to bed or chair)  5 - Death   Eustace Pen MM, Creech RH, Tormey DC, et al. [phone]). "Toxicity and response criteria of the [facility]". [place] Oncol. 5 (6): 649-55    LABORATORY DATA:  Lab Results  Component Value Date   WBC 6.0 01/29/2020   HGB 13.2 01/29/2020   HCT 40.5 01/29/2020   MCV 101.3 (H) 01/29/2020   PLT 180 01/29/2020   Lab Results  Component Value Date   NA 139 01/29/2020   K 3.7 01/29/2020   CL 105 01/29/2020   CO2 29 01/29/2020   Lab Results  Component Value Date   ALT 20 01/29/2020   AST 23 01/29/2020   ALKPHOS 63 01/29/2020   BILITOT 0.5 01/29/2020      RADIOGRAPHY: No results found.     IMPRESSION/PLAN: 1. Stage IB, pT1bN0M0 grade 3 triple negative invasive ductal carcinoma of the left breast. Dr. Lisbeth Renshaw discusses the final pathology findings and her course as well as reviews the nature of left breast disease.  She's done well since surgery and chemotherapy. She's counseled on the role for whole breast radiotherapy as it would be recommended to reduce the risks of local recurrence.  We discussed the risks, benefits, short, and  long term effects of radiotherapy, and the patient is interested in proceeding. Dr. Lisbeth Renshaw discusses the delivery and logistics of radiotherapy and recommends a course of 4 weeks of radiotherapy. Written consent is obtained and placed in the chart, a copy was provided to the patient. She will simulate on Thursday of this week and proceed within the next week to two weeks with radiation.   In a visit lasting 60 minutes, greater than 50% of the time was spent face to face reviewing her case, as well as in preparation of, discussing, and coordinating the patient's care.  The above documentation reflects my direct findings during this shared patient visit. Please see the separate note by Dr. Lisbeth Renshaw on this date for the remainder of the patient's plan of care.    Carola Rhine, PAC

## 2020-02-16 NOTE — Addendum Note (Signed)
Encounter addended by: Sherrlyn Hock, RN on: 02/16/2020 3:08 PM  Actions taken: Allergies reviewed

## 2020-02-18 ENCOUNTER — Other Ambulatory Visit: Payer: Self-pay

## 2020-02-18 ENCOUNTER — Encounter: Payer: Self-pay | Admitting: *Deleted

## 2020-02-18 ENCOUNTER — Ambulatory Visit
Admission: RE | Admit: 2020-02-18 | Discharge: 2020-02-18 | Disposition: A | Discharge status: Home / Self Care | Payer: Medicare Other | Source: Ambulatory Visit | Attending: Radiation Oncology | Admitting: Radiation Oncology

## 2020-02-18 ENCOUNTER — Inpatient Hospital Stay: Payer: Medicare Other | Attending: Hematology and Oncology

## 2020-02-18 VITALS — BP 139/72 | HR 73 | Resp 18

## 2020-02-18 DIAGNOSIS — Z510 Encounter for antineoplastic radiation therapy: Secondary | ICD-10-CM | POA: Insufficient documentation

## 2020-02-18 DIAGNOSIS — C50412 Malignant neoplasm of upper-outer quadrant of left female breast: Secondary | ICD-10-CM | POA: Diagnosis not present

## 2020-02-18 DIAGNOSIS — Z23 Encounter for immunization: Secondary | ICD-10-CM | POA: Diagnosis not present

## 2020-02-18 DIAGNOSIS — Z170 Estrogen receptor positive status [ER+]: Secondary | ICD-10-CM | POA: Diagnosis not present

## 2020-02-18 MED ORDER — INFLUENZA VAC A&B SA ADJ QUAD 0.5 ML IM PRSY
PREFILLED_SYRINGE | INTRAMUSCULAR | Status: AC
  Filled 2020-02-18: qty 0.5

## 2020-02-18 MED ORDER — INFLUENZA VAC A&B SA ADJ QUAD 0.5 ML IM PRSY
0.5000 mL | PREFILLED_SYRINGE | Freq: Once | INTRAMUSCULAR | Status: AC
  Administered 2020-02-18: 0.5 mL via INTRAMUSCULAR

## 2020-02-18 NOTE — Patient Instructions (Signed)
Influenza Virus Vaccine (Flucelvax) What is this medicine? INFLUENZA VIRUS VACCINE (in floo EN zuh VAHY ruhs vak SEEN) helps to reduce the risk of getting influenza also known as the flu. The vaccine only helps protect you against some strains of the flu. This medicine may be used for other purposes; ask your health care provider or pharmacist if you have questions. COMMON BRAND NAME(S): FLUCELVAX What should I tell my health care provider before I take this medicine? They need to know if you have any of these conditions:  bleeding disorder like hemophilia  fever or infection  Guillain-Barre syndrome or other neurological problems  immune system problems  infection with the human immunodeficiency virus (HIV) or AIDS  low blood platelet counts  multiple sclerosis  an unusual or allergic reaction to influenza virus vaccine, other medicines, foods, dyes or preservatives  pregnant or trying to get pregnant  breast-feeding How should I use this medicine? This vaccine is for injection into a muscle. It is given by a health care professional. A copy of Vaccine Information Statements will be given before each vaccination. Read this sheet carefully each time. The sheet may change frequently. Talk to your pediatrician regarding the use of this medicine in children. Special care may be needed. Overdosage: If you think you've taken too much of this medicine contact a poison control center or emergency room at once. Overdosage: If you think you have taken too much of this medicine contact a poison control center or emergency room at once. NOTE: This medicine is only for you. Do not share this medicine with others. What if I miss a dose? This does not apply. What may interact with this medicine?  chemotherapy or radiation therapy  medicines that lower your immune system like etanercept, anakinra, infliximab, and adalimumab  medicines that treat or prevent blood clots like  warfarin  phenytoin  steroid medicines like prednisone or cortisone  theophylline  vaccines This list may not describe all possible interactions. Give your health care provider a list of all the medicines, herbs, non-prescription drugs, or dietary supplements you use. Also tell them if you smoke, drink alcohol, or use illegal drugs. Some items may interact with your medicine. What should I watch for while using this medicine? Report any side effects that do not go away within 3 days to your doctor or health care professional. Call your health care provider if any unusual symptoms occur within 6 weeks of receiving this vaccine. You may still catch the flu, but the illness is not usually as bad. You cannot get the flu from the vaccine. The vaccine will not protect against colds or other illnesses that may cause fever. The vaccine is needed every year. What side effects may I notice from receiving this medicine? Side effects that you should report to your doctor or health care professional as soon as possible:  allergic reactions like skin rash, itching or hives, swelling of the face, lips, or tongue Side effects that usually do not require medical attention (Report these to your doctor or health care professional if they continue or are bothersome.):  fever  headache  muscle aches and pains  pain, tenderness, redness, or swelling at the injection site  tiredness This list may not describe all possible side effects. Call your doctor for medical advice about side effects. You may report side effects to FDA at 1-800-FDA-1088. Where should I keep my medicine? The vaccine will be given by a health care professional in a clinic, pharmacy, doctor's   office, or other health care setting. You will not be given vaccine doses to store at home. NOTE: This sheet is a summary. It may not cover all possible information. If you have questions about this medicine, talk to your doctor, pharmacist, or  health care provider.  2020 Elsevier/Gold Standard (2011-04-04 14:06:47)  

## 2020-02-23 DIAGNOSIS — Z170 Estrogen receptor positive status [ER+]: Secondary | ICD-10-CM | POA: Diagnosis not present

## 2020-02-23 DIAGNOSIS — Z510 Encounter for antineoplastic radiation therapy: Secondary | ICD-10-CM | POA: Diagnosis not present

## 2020-02-23 DIAGNOSIS — C50412 Malignant neoplasm of upper-outer quadrant of left female breast: Secondary | ICD-10-CM | POA: Diagnosis not present

## 2020-02-25 ENCOUNTER — Other Ambulatory Visit: Payer: Self-pay

## 2020-02-25 ENCOUNTER — Ambulatory Visit
Admission: RE | Admit: 2020-02-25 | Discharge: 2020-02-25 | Disposition: A | Discharge status: Home / Self Care | Payer: Medicare Other | Source: Ambulatory Visit | Attending: Radiation Oncology | Admitting: Radiation Oncology

## 2020-02-25 DIAGNOSIS — Z510 Encounter for antineoplastic radiation therapy: Secondary | ICD-10-CM | POA: Diagnosis not present

## 2020-02-25 DIAGNOSIS — C50412 Malignant neoplasm of upper-outer quadrant of left female breast: Secondary | ICD-10-CM | POA: Diagnosis not present

## 2020-02-25 DIAGNOSIS — Z170 Estrogen receptor positive status [ER+]: Secondary | ICD-10-CM | POA: Diagnosis not present

## 2020-02-25 NOTE — Progress Notes (Signed)
Pt here for patient teaching.  Pt given Radiation and You booklet, skin care instructions, Alra deodorant and Radiaplex gel.  Reviewed areas of pertinence such as fatigue, hair loss, skin changes, breast tenderness and breast swelling . Pt able to give teach back of to pat skin and use unscented/gentle soap,apply Radiaplex bid, avoid applying anything to skin within 4 hours of treatment, avoid wearing an under wire bra and to use an electric razor if they must shave. Pt verbalizes understanding of information given and will contact nursing with any questions or concerns.     Jeanifer Halliday M. Salimata Christenson RN, BSN      

## 2020-02-26 ENCOUNTER — Ambulatory Visit
Admission: RE | Admit: 2020-02-26 | Discharge: 2020-02-26 | Disposition: A | Discharge status: Home / Self Care | Payer: Medicare Other | Source: Ambulatory Visit | Attending: Radiation Oncology | Admitting: Radiation Oncology

## 2020-02-26 DIAGNOSIS — C50412 Malignant neoplasm of upper-outer quadrant of left female breast: Secondary | ICD-10-CM

## 2020-02-26 DIAGNOSIS — Z170 Estrogen receptor positive status [ER+]: Secondary | ICD-10-CM | POA: Diagnosis not present

## 2020-02-26 DIAGNOSIS — Z510 Encounter for antineoplastic radiation therapy: Secondary | ICD-10-CM | POA: Diagnosis not present

## 2020-02-26 DIAGNOSIS — Z171 Estrogen receptor negative status [ER-]: Secondary | ICD-10-CM

## 2020-02-26 MED ORDER — RADIAPLEXRX EX GEL: Freq: Once | CUTANEOUS | Status: AC

## 2020-02-26 MED ORDER — ALRA NON-METALLIC DEODORANT (RAD-ONC)
1.0000 "application " | Freq: Once | TOPICAL | Status: AC
  Administered 2020-02-26: 1 via TOPICAL

## 2020-02-29 ENCOUNTER — Ambulatory Visit
Admission: RE | Admit: 2020-02-29 | Discharge: 2020-02-29 | Disposition: A | Discharge status: Home / Self Care | Payer: Medicare Other | Source: Ambulatory Visit | Attending: Radiation Oncology | Admitting: Radiation Oncology

## 2020-02-29 ENCOUNTER — Other Ambulatory Visit: Payer: Self-pay

## 2020-02-29 DIAGNOSIS — C50412 Malignant neoplasm of upper-outer quadrant of left female breast: Secondary | ICD-10-CM | POA: Diagnosis not present

## 2020-02-29 DIAGNOSIS — Z510 Encounter for antineoplastic radiation therapy: Secondary | ICD-10-CM | POA: Diagnosis not present

## 2020-02-29 DIAGNOSIS — Z170 Estrogen receptor positive status [ER+]: Secondary | ICD-10-CM | POA: Diagnosis not present

## 2020-03-01 ENCOUNTER — Ambulatory Visit
Admission: RE | Admit: 2020-03-01 | Discharge: 2020-03-01 | Disposition: A | Discharge status: Home / Self Care | Payer: Medicare Other | Source: Ambulatory Visit | Attending: Radiation Oncology | Admitting: Radiation Oncology

## 2020-03-01 DIAGNOSIS — C50412 Malignant neoplasm of upper-outer quadrant of left female breast: Secondary | ICD-10-CM | POA: Diagnosis not present

## 2020-03-01 DIAGNOSIS — Z170 Estrogen receptor positive status [ER+]: Secondary | ICD-10-CM | POA: Diagnosis not present

## 2020-03-01 DIAGNOSIS — Z510 Encounter for antineoplastic radiation therapy: Secondary | ICD-10-CM | POA: Diagnosis not present

## 2020-03-02 ENCOUNTER — Ambulatory Visit
Admission: RE | Admit: 2020-03-02 | Discharge: 2020-03-02 | Disposition: A | Discharge status: Home / Self Care | Payer: Medicare Other | Source: Ambulatory Visit | Attending: Radiation Oncology | Admitting: Radiation Oncology

## 2020-03-02 DIAGNOSIS — C50412 Malignant neoplasm of upper-outer quadrant of left female breast: Secondary | ICD-10-CM | POA: Diagnosis not present

## 2020-03-02 DIAGNOSIS — Z170 Estrogen receptor positive status [ER+]: Secondary | ICD-10-CM | POA: Diagnosis not present

## 2020-03-02 DIAGNOSIS — Z510 Encounter for antineoplastic radiation therapy: Secondary | ICD-10-CM | POA: Diagnosis not present

## 2020-03-03 ENCOUNTER — Ambulatory Visit
Admission: RE | Admit: 2020-03-03 | Discharge: 2020-03-03 | Disposition: A | Discharge status: Home / Self Care | Payer: Medicare Other | Source: Ambulatory Visit | Attending: Radiation Oncology | Admitting: Radiation Oncology

## 2020-03-03 DIAGNOSIS — Z510 Encounter for antineoplastic radiation therapy: Secondary | ICD-10-CM | POA: Diagnosis not present

## 2020-03-03 DIAGNOSIS — Z170 Estrogen receptor positive status [ER+]: Secondary | ICD-10-CM | POA: Diagnosis not present

## 2020-03-03 DIAGNOSIS — C50412 Malignant neoplasm of upper-outer quadrant of left female breast: Secondary | ICD-10-CM | POA: Diagnosis not present

## 2020-03-04 ENCOUNTER — Ambulatory Visit
Admission: RE | Admit: 2020-03-04 | Discharge: 2020-03-04 | Disposition: A | Discharge status: Home / Self Care | Payer: Medicare Other | Source: Ambulatory Visit | Attending: Radiation Oncology | Admitting: Radiation Oncology

## 2020-03-04 DIAGNOSIS — Z510 Encounter for antineoplastic radiation therapy: Secondary | ICD-10-CM | POA: Diagnosis not present

## 2020-03-04 DIAGNOSIS — Z170 Estrogen receptor positive status [ER+]: Secondary | ICD-10-CM | POA: Diagnosis not present

## 2020-03-04 DIAGNOSIS — C50412 Malignant neoplasm of upper-outer quadrant of left female breast: Secondary | ICD-10-CM | POA: Diagnosis not present

## 2020-03-07 ENCOUNTER — Ambulatory Visit
Admission: RE | Admit: 2020-03-07 | Discharge: 2020-03-07 | Disposition: A | Discharge status: Home / Self Care | Payer: Medicare Other | Source: Ambulatory Visit | Attending: Radiation Oncology | Admitting: Radiation Oncology

## 2020-03-07 DIAGNOSIS — Z170 Estrogen receptor positive status [ER+]: Secondary | ICD-10-CM | POA: Insufficient documentation

## 2020-03-07 DIAGNOSIS — Z510 Encounter for antineoplastic radiation therapy: Secondary | ICD-10-CM | POA: Insufficient documentation

## 2020-03-07 DIAGNOSIS — C50412 Malignant neoplasm of upper-outer quadrant of left female breast: Secondary | ICD-10-CM | POA: Diagnosis not present

## 2020-03-08 ENCOUNTER — Ambulatory Visit
Admission: RE | Admit: 2020-03-08 | Discharge: 2020-03-08 | Disposition: A | Discharge status: Home / Self Care | Payer: Medicare Other | Source: Ambulatory Visit | Attending: Radiation Oncology | Admitting: Radiation Oncology

## 2020-03-08 DIAGNOSIS — Z170 Estrogen receptor positive status [ER+]: Secondary | ICD-10-CM | POA: Diagnosis not present

## 2020-03-08 DIAGNOSIS — Z510 Encounter for antineoplastic radiation therapy: Secondary | ICD-10-CM | POA: Diagnosis not present

## 2020-03-08 DIAGNOSIS — C50412 Malignant neoplasm of upper-outer quadrant of left female breast: Secondary | ICD-10-CM | POA: Diagnosis not present

## 2020-03-09 ENCOUNTER — Ambulatory Visit
Admission: RE | Admit: 2020-03-09 | Discharge: 2020-03-09 | Disposition: A | Discharge status: Home / Self Care | Payer: Medicare Other | Source: Ambulatory Visit | Attending: Radiation Oncology | Admitting: Radiation Oncology

## 2020-03-09 DIAGNOSIS — Z510 Encounter for antineoplastic radiation therapy: Secondary | ICD-10-CM | POA: Diagnosis not present

## 2020-03-09 DIAGNOSIS — Z170 Estrogen receptor positive status [ER+]: Secondary | ICD-10-CM | POA: Diagnosis not present

## 2020-03-09 DIAGNOSIS — C50412 Malignant neoplasm of upper-outer quadrant of left female breast: Secondary | ICD-10-CM | POA: Diagnosis not present

## 2020-03-10 ENCOUNTER — Ambulatory Visit
Admission: RE | Admit: 2020-03-10 | Discharge: 2020-03-10 | Disposition: A | Discharge status: Home / Self Care | Payer: Medicare Other | Source: Ambulatory Visit | Attending: Radiation Oncology | Admitting: Radiation Oncology

## 2020-03-10 ENCOUNTER — Telehealth: Payer: Self-pay | Admitting: Hematology and Oncology

## 2020-03-10 DIAGNOSIS — Z170 Estrogen receptor positive status [ER+]: Secondary | ICD-10-CM | POA: Diagnosis not present

## 2020-03-10 DIAGNOSIS — C50412 Malignant neoplasm of upper-outer quadrant of left female breast: Secondary | ICD-10-CM | POA: Diagnosis not present

## 2020-03-10 DIAGNOSIS — Z510 Encounter for antineoplastic radiation therapy: Secondary | ICD-10-CM | POA: Diagnosis not present

## 2020-03-10 NOTE — Telephone Encounter (Signed)
Scheduled appt per 11/3 sch msg - pt is aware of appt date and time

## 2020-03-11 ENCOUNTER — Ambulatory Visit: Payer: Medicare Other | Admitting: Radiation Oncology

## 2020-03-11 ENCOUNTER — Ambulatory Visit
Admission: RE | Admit: 2020-03-11 | Discharge: 2020-03-11 | Disposition: A | Discharge status: Home / Self Care | Payer: Medicare Other | Source: Ambulatory Visit | Attending: Radiation Oncology | Admitting: Radiation Oncology

## 2020-03-11 DIAGNOSIS — Z510 Encounter for antineoplastic radiation therapy: Secondary | ICD-10-CM | POA: Diagnosis not present

## 2020-03-11 DIAGNOSIS — Z170 Estrogen receptor positive status [ER+]: Secondary | ICD-10-CM | POA: Diagnosis not present

## 2020-03-11 DIAGNOSIS — C50412 Malignant neoplasm of upper-outer quadrant of left female breast: Secondary | ICD-10-CM | POA: Diagnosis not present

## 2020-03-14 ENCOUNTER — Ambulatory Visit
Admission: RE | Admit: 2020-03-14 | Discharge: 2020-03-14 | Disposition: A | Discharge status: Home / Self Care | Payer: Medicare Other | Source: Ambulatory Visit | Attending: Radiation Oncology | Admitting: Radiation Oncology

## 2020-03-14 DIAGNOSIS — Z170 Estrogen receptor positive status [ER+]: Secondary | ICD-10-CM | POA: Diagnosis not present

## 2020-03-14 DIAGNOSIS — Z510 Encounter for antineoplastic radiation therapy: Secondary | ICD-10-CM | POA: Diagnosis not present

## 2020-03-14 DIAGNOSIS — C50412 Malignant neoplasm of upper-outer quadrant of left female breast: Secondary | ICD-10-CM | POA: Diagnosis not present

## 2020-03-15 ENCOUNTER — Ambulatory Visit
Admission: RE | Admit: 2020-03-15 | Discharge: 2020-03-15 | Disposition: A | Discharge status: Home / Self Care | Payer: Medicare Other | Source: Ambulatory Visit | Attending: Radiation Oncology | Admitting: Radiation Oncology

## 2020-03-15 DIAGNOSIS — Z510 Encounter for antineoplastic radiation therapy: Secondary | ICD-10-CM | POA: Diagnosis not present

## 2020-03-15 DIAGNOSIS — C50412 Malignant neoplasm of upper-outer quadrant of left female breast: Secondary | ICD-10-CM | POA: Diagnosis not present

## 2020-03-15 DIAGNOSIS — Z170 Estrogen receptor positive status [ER+]: Secondary | ICD-10-CM | POA: Diagnosis not present

## 2020-03-16 ENCOUNTER — Ambulatory Visit
Admission: RE | Admit: 2020-03-16 | Discharge: 2020-03-16 | Disposition: A | Discharge status: Home / Self Care | Payer: Medicare Other | Source: Ambulatory Visit | Attending: Radiation Oncology | Admitting: Radiation Oncology

## 2020-03-16 DIAGNOSIS — Z510 Encounter for antineoplastic radiation therapy: Secondary | ICD-10-CM | POA: Diagnosis not present

## 2020-03-16 DIAGNOSIS — Z170 Estrogen receptor positive status [ER+]: Secondary | ICD-10-CM | POA: Diagnosis not present

## 2020-03-16 DIAGNOSIS — C50412 Malignant neoplasm of upper-outer quadrant of left female breast: Secondary | ICD-10-CM | POA: Diagnosis not present

## 2020-03-17 ENCOUNTER — Ambulatory Visit
Admission: RE | Admit: 2020-03-17 | Discharge: 2020-03-17 | Disposition: A | Discharge status: Home / Self Care | Payer: Medicare Other | Source: Ambulatory Visit | Attending: Radiation Oncology | Admitting: Radiation Oncology

## 2020-03-17 DIAGNOSIS — Z510 Encounter for antineoplastic radiation therapy: Secondary | ICD-10-CM | POA: Diagnosis not present

## 2020-03-17 DIAGNOSIS — C50412 Malignant neoplasm of upper-outer quadrant of left female breast: Secondary | ICD-10-CM | POA: Diagnosis not present

## 2020-03-17 DIAGNOSIS — Z170 Estrogen receptor positive status [ER+]: Secondary | ICD-10-CM | POA: Diagnosis not present

## 2020-03-18 ENCOUNTER — Ambulatory Visit
Admission: RE | Admit: 2020-03-18 | Discharge: 2020-03-18 | Disposition: A | Discharge status: Home / Self Care | Payer: Medicare Other | Source: Ambulatory Visit | Attending: Radiation Oncology | Admitting: Radiation Oncology

## 2020-03-18 DIAGNOSIS — Z170 Estrogen receptor positive status [ER+]: Secondary | ICD-10-CM | POA: Diagnosis not present

## 2020-03-18 DIAGNOSIS — C50412 Malignant neoplasm of upper-outer quadrant of left female breast: Secondary | ICD-10-CM | POA: Diagnosis not present

## 2020-03-18 DIAGNOSIS — Z510 Encounter for antineoplastic radiation therapy: Secondary | ICD-10-CM | POA: Diagnosis not present

## 2020-03-21 ENCOUNTER — Ambulatory Visit
Admission: RE | Admit: 2020-03-21 | Discharge: 2020-03-21 | Disposition: A | Discharge status: Home / Self Care | Payer: Medicare Other | Source: Ambulatory Visit | Attending: Radiation Oncology | Admitting: Radiation Oncology

## 2020-03-21 DIAGNOSIS — Z510 Encounter for antineoplastic radiation therapy: Secondary | ICD-10-CM | POA: Diagnosis not present

## 2020-03-21 DIAGNOSIS — C50412 Malignant neoplasm of upper-outer quadrant of left female breast: Secondary | ICD-10-CM | POA: Diagnosis not present

## 2020-03-21 DIAGNOSIS — Z170 Estrogen receptor positive status [ER+]: Secondary | ICD-10-CM | POA: Diagnosis not present

## 2020-03-21 NOTE — Progress Notes (Signed)
Patient Care Team: Hoyt Koch, MD as PCP - General (Internal Medicine) Mauro Kaufmann, RN as Oncology Nurse Navigator Rockwell Germany, RN as Oncology Nurse Navigator Donnie Mesa, MD as Consulting Physician (General Surgery) Nicholas Lose, MD as Consulting Physician (Hematology and Oncology) Kyung Rudd, MD as Consulting Physician (Radiation Oncology)  DIAGNOSIS:    ICD-10-CM   1. Malignant neoplasm of upper-outer quadrant of left breast in female, estrogen receptor negative ([place])  C50.412    Z17.1     SUMMARY OF ONCOLOGIC HISTORY: Oncology History  Malignant neoplasm of upper-outer quadrant of left breast in female, estrogen receptor negative ([place])  09/08/2019 Initial Diagnosis   Screening mammogram detected a left breast asymmetry. Diagnostic mammogram and US showed a 0.5cm mass, 2 o'clock position, with no axillary adenopathy. Biopsy showed IDC, grade 3, HER-2 - (0), ER/PR -, Ki67 50%.   09/09/2019 Cancer Staging   Staging form: Breast, AJCC 8th Edition - Clinical stage from 09/09/2019: Stage IB (cT1a, cN0, cM0, G3, ER-, PR-, HER2-) - Signed by Nicholas Lose, MD on 09/09/2019   09/16/2019 Surgery   Left lumpectomy (Tsuei): IDC, grade 3, 0.9cm, with high grade DCIS, 4 left axillary lymph nodes negative.   09/16/2019 Cancer Staging   Staging form: Breast, AJCC 8th Edition - Pathologic stage from 09/16/2019: Stage IB (pT1b, pN0, cM0, G3, ER-, PR-, HER2-) - Signed by Gardenia Phlegm, NP on 10/07/2019   10/15/2019 -  Adjuvant Chemotherapy   CMF x 8   02/26/2020 -  Radiation Therapy   Adjuvant left breast radiation     CHIEF COMPLIANT: Follow-up of left breast cancer   INTERVAL HISTORY: Kim Fuller is a 74 y.o. with above-mentioned history of triple negative left breastcancerwhounderwent a left lumpectomy, completed adjuvant chemotherapy with CMF, and is currently undergoing radiation. She presents to the clinic todayfor follow-up.  She completes  radiation therapy tomorrow and is very excited about that.  She has done extremely well from radiation standpoint.  She does have mild fatigue  ALLERGIES:  is allergic to sulfonamide derivatives and tramadol hcl.  MEDICATIONS:  Current Outpatient Medications  Medication Sig Dispense Refill  . acetaminophen (TYLENOL) 500 MG tablet Take 500-1,000 mg by mouth every 6 (six) hours as needed (for pain.).    Marland Kitchen aspirin 81 MG chewable tablet Chew 81 mg by mouth at bedtime.     . benazepril (LOTENSIN) 40 MG tablet Take 0.5 tablets (20 mg total) by mouth daily. 45 tablet 3  . bisoprolol-hydrochlorothiazide (ZIAC) 5-6.25 MG tablet Take 1 tablet by mouth daily. 90 tablet 3  . calcium carbonate (OSCAL) 1500 (600 Ca) MG TABS tablet Take 600 mg of elemental calcium by mouth 2 (two) times daily with a meal.    . cholecalciferol (VITAMIN D3) 10 MCG (400 UNIT) TABS tablet Take 400 Units by mouth at bedtime.     Marland Kitchen esomeprazole (NEXIUM) 20 MG capsule Take 20 mg by mouth daily as needed (acid reflux/indigestion.).    Marland Kitchen fluticasone (FLONASE) 50 MCG/ACT nasal spray USE TWO SPRAY(S) IN EACH NOSTRIL DAILY 48 g 6  . ibuprofen (ADVIL) 200 MG tablet Take 400 mg by mouth every 8 (eight) hours as needed (pain.).     Marland Kitchen lidocaine-prilocaine (EMLA) cream Apply to affected area once (Patient not taking: Reported on 02/16/2020) 30 g 3  . Multiple Vitamin (MULTIVITAMIN WITH MINERALS) TABS tablet Take 1 tablet by mouth daily. Centrum Silver    . Omega-3 Fatty Acids (FISH OIL) 1000 MG CAPS Take 1,000  mg by mouth 2 (two) times daily.    . ondansetron (ZOFRAN) 8 MG tablet Take 1 tablet (8 mg total) by mouth 2 (two) times daily as needed for refractory nausea / vomiting. Start on day 3 after chemotherapy. 30 tablet 1  . pravastatin (PRAVACHOL) 20 MG tablet Take 1 tablet (20 mg total) by mouth daily. (Patient taking differently: Take 20 mg by mouth every evening. ) 90 tablet 3  . prochlorperazine (COMPAZINE) 10 MG tablet Take 1 tablet  (10 mg total) by mouth every 6 (six) hours as needed (Nausea or vomiting). 30 tablet 1  . Propylene Glycol (SYSTANE BALANCE) 0.6 % SOLN Place 1 drop 3 (three) times daily as needed into both eyes (dry eyes).    . vitamin E 400 UNIT capsule Take 400 Units by mouth daily.     No current facility-administered medications for this visit.   Facility-Administered Medications Ordered in Other Visits  Medication Dose Route Frequency Provider Last Rate Last Admin  . sodium chloride flush (NS) 0.9 % injection 10 mL  10 mL Intracatheter PRN Nicholas Lose, MD   10 mL at 12/17/19 1604    PHYSICAL EXAMINATION: ECOG PERFORMANCE STATUS: 1 - Symptomatic but completely ambulatory  Vitals:   03/22/20 0947  BP: 125/66  Pulse: (!) 57  Resp: 17  Temp: (!) 97.2 F (36.2 C)  SpO2: 97%   Filed Weights   03/22/20 0947  Weight: 216 lb 6.4 oz (98.2 kg)     LABORATORY DATA:  I have reviewed the data as listed CMP Latest Ref Rng & Units 01/29/2020 01/08/2020 12/17/2019  Glucose 70 - 99 mg/dL 95 86 90  BUN 8 - 23 mg/dL $Remove'12 11 9  'ZdEvxXB$ Creatinine 0.44 - 1.00 mg/dL 0.72 0.67 0.77  Sodium 135 - 145 mmol/L 139 141 142  Potassium 3.5 - 5.1 mmol/L 3.7 3.7 3.9  Chloride 98 - 111 mmol/L 105 104 105  CO2 22 - 32 mmol/L $RemoveB'29 29 28  'DhcXQzRH$ Calcium 8.9 - 10.3 mg/dL 9.1 9.5 10.4(H)  Total Protein 6.5 - 8.1 g/dL 7.2 7.0 7.1  Total Bilirubin 0.3 - 1.2 mg/dL 0.5 0.5 0.5  Alkaline Phos 38 - 126 U/L 63 61 61  AST 15 - 41 U/L $Remo'23 20 20  'IEwRW$ ALT 0 - 44 U/L $Remo'20 15 17    'TdSfs$ Lab Results  Component Value Date   WBC 6.0 01/29/2020   HGB 13.2 01/29/2020   HCT 40.5 01/29/2020   MCV 101.3 (H) 01/29/2020   PLT 180 01/29/2020   NEUTROABS 3.5 01/29/2020    ASSESSMENT & PLAN:  Malignant neoplasm of upper-outer quadrant of left breast in female, estrogen receptor negative ([place]) 09/16/2018:Left lumpectomy (Tsuei): IDC, grade 3, 0.9cm, with high grade DCIS, 4 left axillary lymph nodes negative.Triple negative with a Ki-67 of 50%  Treatment  plan: 1.Adjuvant chemotherapy with CMF x6 completed 01/29/2020 2.Adjuvant radiation therapy 02/26/2020-03/23/2020 ------------------------------------------------------------------------------------------------------------------------------------------------------------ Current Treatment: Surveillance Mammograms to be done April 2022 CT chest abdomen pelvis in June 2022  I recommended she continue with healthy diet and exercise. Return to clinic in June 2022 for follow-up after scans.    No orders of the defined types were placed in this encounter.  The patient has a good understanding of the overall plan. she agrees with it. she will call with any problems that may develop before the next visit here.  Total time spent: 20 mins including face to face time and time spent for planning, charting and coordination of care  Nicholas Lose, MD 03/22/2020  I, Molly Dorshimer, am acting as scribe for Dr. Nicholas Lose.  I have reviewed the above documentation for accuracy and completeness, and I agree with the above.

## 2020-03-22 ENCOUNTER — Telehealth: Payer: Self-pay | Admitting: Hematology and Oncology

## 2020-03-22 ENCOUNTER — Ambulatory Visit
Admission: RE | Admit: 2020-03-22 | Discharge: 2020-03-22 | Disposition: A | Discharge status: Home / Self Care | Payer: Medicare Other | Source: Ambulatory Visit | Attending: Radiation Oncology | Admitting: Radiation Oncology

## 2020-03-22 ENCOUNTER — Other Ambulatory Visit: Payer: Self-pay

## 2020-03-22 ENCOUNTER — Encounter: Payer: Self-pay | Admitting: *Deleted

## 2020-03-22 ENCOUNTER — Inpatient Hospital Stay: Payer: Medicare Other | Attending: Hematology and Oncology | Admitting: Hematology and Oncology

## 2020-03-22 DIAGNOSIS — Z510 Encounter for antineoplastic radiation therapy: Secondary | ICD-10-CM | POA: Diagnosis not present

## 2020-03-22 DIAGNOSIS — Z79899 Other long term (current) drug therapy: Secondary | ICD-10-CM | POA: Insufficient documentation

## 2020-03-22 DIAGNOSIS — Z9221 Personal history of antineoplastic chemotherapy: Secondary | ICD-10-CM | POA: Insufficient documentation

## 2020-03-22 DIAGNOSIS — R5383 Other fatigue: Secondary | ICD-10-CM | POA: Diagnosis not present

## 2020-03-22 DIAGNOSIS — C50412 Malignant neoplasm of upper-outer quadrant of left female breast: Secondary | ICD-10-CM | POA: Insufficient documentation

## 2020-03-22 DIAGNOSIS — Z923 Personal history of irradiation: Secondary | ICD-10-CM | POA: Insufficient documentation

## 2020-03-22 DIAGNOSIS — Z7982 Long term (current) use of aspirin: Secondary | ICD-10-CM | POA: Insufficient documentation

## 2020-03-22 DIAGNOSIS — Z171 Estrogen receptor negative status [ER-]: Secondary | ICD-10-CM | POA: Diagnosis not present

## 2020-03-22 DIAGNOSIS — Z170 Estrogen receptor positive status [ER+]: Secondary | ICD-10-CM | POA: Diagnosis not present

## 2020-03-22 NOTE — Telephone Encounter (Signed)
Scheduled appts per 11/16 los. Gave pt a print out of AVS.

## 2020-03-22 NOTE — Assessment & Plan Note (Signed)
09/16/2018:Left lumpectomy (Tsuei): IDC, grade 3, 0.9cm, with high grade DCIS, 4 left axillary lymph nodes negative.Triple negative with a Ki-67 of 50%  Treatment plan: 1.Adjuvant chemotherapy with CMF x6 completed 01/29/2020 2.Adjuvant radiation therapy 02/26/2020-03/23/2020 ------------------------------------------------------------------------------------------------------------------------------------------------------------ Current Treatment: Surveillance  I recommended Kim Fuller continue with healthy diet and exercise. Return to clinic in 3 months for survivorship care plan visit.

## 2020-03-23 ENCOUNTER — Ambulatory Visit
Admission: RE | Admit: 2020-03-23 | Discharge: 2020-03-23 | Disposition: A | Discharge status: Home / Self Care | Payer: Medicare Other | Source: Ambulatory Visit | Attending: Radiation Oncology | Admitting: Radiation Oncology

## 2020-03-23 ENCOUNTER — Encounter: Payer: Self-pay | Admitting: Radiation Oncology

## 2020-03-23 DIAGNOSIS — C50412 Malignant neoplasm of upper-outer quadrant of left female breast: Secondary | ICD-10-CM | POA: Diagnosis not present

## 2020-03-23 DIAGNOSIS — Z510 Encounter for antineoplastic radiation therapy: Secondary | ICD-10-CM | POA: Diagnosis not present

## 2020-03-23 DIAGNOSIS — Z170 Estrogen receptor positive status [ER+]: Secondary | ICD-10-CM | POA: Diagnosis not present

## 2020-03-28 ENCOUNTER — Other Ambulatory Visit: Payer: Self-pay

## 2020-03-28 ENCOUNTER — Ambulatory Visit: Payer: Medicare Other | Attending: Surgery

## 2020-03-28 DIAGNOSIS — Z483 Aftercare following surgery for neoplasm: Secondary | ICD-10-CM | POA: Insufficient documentation

## 2020-03-28 NOTE — Therapy (Signed)
[place], [place], [address] Phone: [phone]   Fax:  [phone]  Physical Therapy Treatment  Patient Details  Name: Kim Fuller MRN: [id number] Date of Birth: [birth year] Referring Provider (PT): Dr. Donnie Mesa   Encounter Date: 03/28/2020   PT End of Session - 03/28/20 0938    Visit Number 2   # unchanged due to screen only   Number of Visits 2    Date for PT Re-Evaluation 11/04/19    PT Start Time 0931    PT Stop Time 0938    PT Time Calculation (min) 7 min    Activity Tolerance Patient tolerated treatment well    Behavior During Therapy [facility] for tasks assessed/performed           Past Medical History:  Diagnosis Date  . Abnormal finding on Pap smear    x1  . Diverticulosis   . GERD (gastroesophageal reflux disease)   . Hyperlipidemia   . Hypertension   . Osteopenia    -1.5 @ femoral neck 11/2007  . Reactive airway disease    years ago ; severe asthma attack "bornchitis asthma " per patient, no issues since     Past Surgical History:  Procedure Laterality Date  . BREAST LUMPECTOMY WITH RADIOACTIVE SEED AND SENTINEL LYMPH NODE BIOPSY Left 09/16/2019   Procedure: LEFT BREAST LUMPECTOMY WITH RADIOACTIVE SEED AND SENTINEL LYMPH NODE BIOPSY;  Surgeon: Donnie Mesa, MD;  Location: [facility];  Service: General;  Laterality: Left;  . CARDIOVASCULAR STRESS TEST  03/05/06  . CARPAL TUNNEL RELEASE  2009    bilaterally  . CATARACT EXTRACTION, BILATERAL     Dr Gershon Crane  . COLONOSCOPY W/ POLYPECTOMY  1998   Tics @ 2004 & 2012; Dr Carlean Purl  . HAND SURGERY     Trigger thumb  . PARTIAL KNEE ARTHROPLASTY Left 04/03/2017   Procedure: Left knee medial unicompartmental arthroplasty;  Surgeon: Gaynelle Arabian, MD;  Location: [facility];  Service: Orthopedics;  Laterality: Left;  Adductor canal block  . PORTACATH PLACEMENT N/A 09/29/2019   Procedure: INSERTION PORT-A-CATH;   Surgeon: Donnie Mesa, MD;  Location: [facility];  Service: General;  Laterality: N/A;  . TRIGGER FINGER RELEASE Right   . TUBAL LIGATION      There were no vitals filed for this visit.   Subjective Assessment - 03/28/20 0933    Subjective Pt returns for 3 month L-Dex screen. Finished radiation last WEdnesday so I'm glad about that! My skin is doing well, just itches.    Pertinent History Patient was diagnosed on 08/19/2019 with left triple negative invasive ductal carcinoma breast cancer. It is grade III with a Ki67 of 50%. Patient reports she underwent a left lumpectomy and sentinel node biopsy (4 negative nodes) on 09/16/2019. She had a partial left knee replacement in 2018 and some chronic low back pain.                  L-DEX FLOWSHEETS - 03/28/20 0900      L-DEX LYMPHEDEMA SCREENING   BASELINE SCORE (UNILATERAL) 6    L-DEX SCORE (UNILATERAL) 10.9    VALUE CHANGE (UNILAT) 4.9                                  PT Long Term Goals - 11/02/19 1128      PT LONG TERM GOAL #1   Title Patient  will demonstrate she has regained full shoulder ROM and function post operatively compared to baselines.    Time 8    Period Weeks    Status Partially Met                 Plan - 03/28/20 0939    Clinical Impression Statement Pt returns for 3 month L-Dex screen and excited she completed radiation last week. Her change from baseline of 4.9 is WNLs so no further treatment needed at this time except to cont L-Dex screens every 3 months for up to 2 years from her SLNB, then every 6 months after that.    PT Next Visit Plan Cont every 3 month L-Dex screens for 2 years (until ~09/15/2021), then every 6 months.    Consulted and Agree with Plan of Care Patient           Patient will benefit from skilled therapeutic intervention in order to improve the following deficits and impairments:     Visit Diagnosis: Aftercare following surgery for neoplasm     Problem  List Patient Active Problem List   Diagnosis Date Noted  . Port-A-Cath in place 10/27/2019  . Malignant neoplasm of upper-outer quadrant of left breast in female, estrogen receptor negative ([place]) 09/08/2019  . Acute left-sided low back pain without sciatica 07/06/2019  . Morbid obesity ([place]) 04/24/2019  . Trigger finger, right middle finger 07/02/2017  . Chronic shoulder bursitis, left 11/19/2016  . OA (osteoarthritis) of knee 11/18/2015  . Routine general medical examination at a health care facility 02/20/2015  . GERD 12/31/2007  . Osteopenia 10/31/2007  . HYPERLIPIDEMIA 03/20/2007  . Essential hypertension 03/20/2007    Otelia Limes, PTA 03/28/2020, 9:41 AM  [place], [place], [address] Phone: [phone]   Fax:  [phone]  Name: Kim Fuller MRN: [id number] Date of Birth: 11-Nov-1945

## 2020-04-09 NOTE — Progress Notes (Signed)
  Radiation Oncology         [phone]) [phone] ________________________________  Name: Kim Fuller MRN: [id number]  Date: 03/23/2020  DOB: 1945/07/25  End of Treatment Note  Diagnosis:   left-sided breast cancer     Indication for treatment:  Curative       Radiation treatment dates:   101/21/21 - 03/23/20  Site/dose:   The patient initially received a dose of 42.56 Gy in 16 fractions to the breast using whole-breast tangent fields. This was delivered using a 3-D conformal technique. The patient then received a boost to the seroma. This delivered an additional 10 Gy in 25fractions using a 3 field photon technique due to the depth of the seroma. The total dose was 52.56 Gy.  Narrative: The patient tolerated radiation treatment relatively well.   The patient had some expected skin irritation as she progressed during treatment.    Plan: The patient has completed radiation treatment. The patient will return to radiation oncology clinic for routine followup in one month. I advised the patient to call or return sooner if they have any questions or concerns related to their recovery or treatment. ________________________________  Jodelle Gross, M.D., Ph.D.

## 2020-04-14 ENCOUNTER — Ambulatory Visit (INDEPENDENT_AMBULATORY_CARE_PROVIDER_SITE_OTHER): Payer: Medicare Other

## 2020-04-14 ENCOUNTER — Other Ambulatory Visit: Payer: Self-pay

## 2020-04-14 VITALS — BP 160/80 | HR 55 | Temp 97.9°F | Ht 62.0 in | Wt 217.2 lb

## 2020-04-14 DIAGNOSIS — Z0000 Encounter for general adult medical examination without abnormal findings: Secondary | ICD-10-CM

## 2020-04-14 NOTE — Patient Instructions (Addendum)
Kim Fuller , Thank you for taking time to come for your Medicare Wellness Visit. I appreciate your ongoing commitment to your health goals. Please review the following plan we discussed and let me know if I can assist you in the future.   Screening recommendations/referrals: Colonoscopy: 07/27/2010; due every 10 years Mammogram: 08/24/2019 Bone Density: 07/29/2015; due every 2 years Recommended yearly ophthalmology/optometry visit for glaucoma screening and checkup Recommended yearly dental visit for hygiene and checkup  Vaccinations: Influenza vaccine: 02/18/2020 Pneumococcal vaccine: up to date Tdap vaccine: 06/09/2009; overdue Shingles vaccine: never done Covid-19: up to date  Advanced directives: Advance directive discussed with you today. Even though you declined this today please call our office should you change your mind and we can give you the proper paperwork for you to fill out.  Conditions/risks identified: Yes; Reviewed health maintenance screenings with patient today and relevant education, vaccines, and/or referrals were provided. Please continue to do your personal lifestyle choices by: daily care of teeth and gums, regular physical activity (goal should be 5 days a week for 30 minutes), eat a healthy diet, avoid tobacco and drug use, limiting any alcohol intake, taking a low-dose aspirin (if not allergic or have been advised by your provider otherwise) and taking vitamins and minerals as recommended by your provider. Continue doing brain stimulating activities (puzzles, reading, adult coloring books, staying active) to keep memory sharp. Continue to eat heart healthy diet (full of fruits, vegetables, whole grains, lean protein, water--limit salt, fat, and sugar intake) and increase physical activity as tolerated.  Next appointment: Please schedule your next Medicare Wellness Visit with your Nurse Health Advisor in 1 year by calling [phone].   Preventive Care 63 Years and  Older, Female Preventive care refers to lifestyle choices and visits with your health care provider that can promote health and wellness. What does preventive care include?  A yearly physical exam. This is also called an annual well check.  Dental exams once or twice a year.  Routine eye exams. Ask your health care provider how often you should have your eyes checked.  Personal lifestyle choices, including:  Daily care of your teeth and gums.  Regular physical activity.  Eating a healthy diet.  Avoiding tobacco and drug use.  Limiting alcohol use.  Practicing safe sex.  Taking low-dose aspirin every day.  Taking vitamin and mineral supplements as recommended by your health care provider. What happens during an annual well check? The services and screenings done by your health care provider during your annual well check will depend on your age, overall health, lifestyle risk factors, and family history of disease. Counseling  Your health care provider may ask you questions about your:  Alcohol use.  Tobacco use.  Drug use.  Emotional well-being.  Home and relationship well-being.  Sexual activity.  Eating habits.  History of falls.  Memory and ability to understand (cognition).  Work and work Statistician.  Reproductive health. Screening  You may have the following tests or measurements:  Height, weight, and BMI.  Blood pressure.  Lipid and cholesterol levels. These may be checked every 5 years, or more frequently if you are over 13 years old.  Skin check.  Lung cancer screening. You may have this screening every year starting at age 34 if you have a 30-pack-year history of smoking and currently smoke or have quit within the past 15 years.  Fecal occult blood test (FOBT) of the stool. You may have this test every year starting at age  50.  Flexible sigmoidoscopy or colonoscopy. You may have a sigmoidoscopy every 5 years or a colonoscopy every 10 years  starting at age 34.  Hepatitis C blood test.  Hepatitis B blood test.  Sexually transmitted disease (STD) testing.  Diabetes screening. This is done by checking your blood sugar (glucose) after you have not eaten for a while (fasting). You may have this done every 1-3 years.  Bone density scan. This is done to screen for osteoporosis. You may have this done starting at age 93.  Mammogram. This may be done every 1-2 years. Talk to your health care provider about how often you should have regular mammograms. Talk with your health care provider about your test results, treatment options, and if necessary, the need for more tests. Vaccines  Your health care provider may recommend certain vaccines, such as:  Influenza vaccine. This is recommended every year.  Tetanus, diphtheria, and acellular pertussis (Tdap, Td) vaccine. You may need a Td booster every 10 years.  Zoster vaccine. You may need this after age 69.  Pneumococcal 13-valent conjugate (PCV13) vaccine. One dose is recommended after age 37.  Pneumococcal polysaccharide (PPSV23) vaccine. One dose is recommended after age 22. Talk to your health care provider about which screenings and vaccines you need and how often you need them. This information is not intended to replace advice given to you by your health care provider. Make sure you discuss any questions you have with your health care provider. Document Released: 05/20/2015 Document Revised: 01/11/2016 Document Reviewed: 02/22/2015 Elsevier Interactive Patient Education  2017 [employer] Prevention in the Home Falls can cause injuries. They can happen to people of all ages. There are many things you can do to make your home safe and to help prevent falls. What can I do on the outside of my home?  Regularly fix the edges of walkways and driveways and fix any cracks.  Remove anything that might make you trip as you walk through a door, such as a raised step or  threshold.  Trim any bushes or trees on the path to your home.  Use bright outdoor lighting.  Clear any walking paths of anything that might make someone trip, such as rocks or tools.  Regularly check to see if handrails are loose or broken. Make sure that both sides of any steps have handrails.  Any raised decks and porches should have guardrails on the edges.  Have any leaves, snow, or ice cleared regularly.  Use sand or salt on walking paths during winter.  Clean up any spills in your garage right away. This includes oil or grease spills. What can I do in the bathroom?  Use night lights.  Install grab bars by the toilet and in the tub and shower. Do not use towel bars as grab bars.  Use non-skid mats or decals in the tub or shower.  If you need to sit down in the shower, use a plastic, non-slip stool.  Keep the floor dry. Clean up any water that spills on the floor as soon as it happens.  Remove soap buildup in the tub or shower regularly.  Attach bath mats securely with double-sided non-slip rug tape.  Do not have throw rugs and other things on the floor that can make you trip. What can I do in the bedroom?  Use night lights.  Make sure that you have a light by your bed that is easy to reach.  Do not use any sheets  or blankets that are too big for your bed. They should not hang down onto the floor.  Have a firm chair that has side arms. You can use this for support while you get dressed.  Do not have throw rugs and other things on the floor that can make you trip. What can I do in the kitchen?  Clean up any spills right away.  Avoid walking on wet floors.  Keep items that you use a lot in easy-to-reach places.  If you need to reach something above you, use a strong step stool that has a grab bar.  Keep electrical cords out of the way.  Do not use floor polish or wax that makes floors slippery. If you must use wax, use non-skid floor wax.  Do not have  throw rugs and other things on the floor that can make you trip. What can I do with my stairs?  Do not leave any items on the stairs.  Make sure that there are handrails on both sides of the stairs and use them. Fix handrails that are broken or loose. Make sure that handrails are as long as the stairways.  Check any carpeting to make sure that it is firmly attached to the stairs. Fix any carpet that is loose or worn.  Avoid having throw rugs at the top or bottom of the stairs. If you do have throw rugs, attach them to the floor with carpet tape.  Make sure that you have a light switch at the top of the stairs and the bottom of the stairs. If you do not have them, ask someone to add them for you. What else can I do to help prevent falls?  Wear shoes that:  Do not have high heels.  Have rubber bottoms.  Are comfortable and fit you well.  Are closed at the toe. Do not wear sandals.  If you use a stepladder:  Make sure that it is fully opened. Do not climb a closed stepladder.  Make sure that both sides of the stepladder are locked into place.  Ask someone to hold it for you, if possible.  Clearly mark and make sure that you can see:  Any grab bars or handrails.  First and last steps.  Where the edge of each step is.  Use tools that help you move around (mobility aids) if they are needed. These include:  Canes.  Walkers.  Scooters.  Crutches.  Turn on the lights when you go into a dark area. Replace any light bulbs as soon as they burn out.  Set up your furniture so you have a clear path. Avoid moving your furniture around.  If any of your floors are uneven, fix them.  If there are any pets around you, be aware of where they are.  Review your medicines with your doctor. Some medicines can make you feel dizzy. This can increase your chance of falling. Ask your doctor what other things that you can do to help prevent falls. This information is not intended to  replace advice given to you by your health care provider. Make sure you discuss any questions you have with your health care provider. Document Released: 02/17/2009 Document Revised: 09/29/2015 Document Reviewed: 05/28/2014 Elsevier Interactive Patient Education  2017 [employer].

## 2020-04-14 NOTE — Progress Notes (Signed)
Subjective:   Kim Fuller is a 74 y.o. female who presents for Medicare Annual (Subsequent) preventive examination.  Review of Systems    No ROS. Medicare Wellness Visit. Additional risk factors are reflected in social history. Cardiac Risk Factors include: advanced age (>71men, >86 women);dyslipidemia;family history of premature cardiovascular disease;hypertension;obesity (BMI >30kg/m2)     Objective:    Today's Vitals   04/14/20 1135  BP: (!) 160/80  Pulse: (!) 55  Temp: 97.9 F (36.6 C)  SpO2: 95%  Weight: 217 lb 3.2 oz (98.5 kg)  Height: 5\' 2"  (1.575 m)  PainSc: 0-No pain   Body mass index is 39.73 kg/m.  Advanced Directives 04/14/2020 02/16/2020 11/05/2019 10/15/2019 09/16/2019 09/11/2019 09/09/2019  Does Patient Have a Medical Advance Directive? No No No No No No No  Would patient like information on creating a medical advance directive? No - Patient declined No - Patient declined No - Patient declined No - Patient declined No - Patient declined Yes (MAU/Ambulatory/Procedural Areas - Information given) No - Patient declined    Current Medications (verified) Outpatient Encounter Medications as of 04/14/2020  Medication Sig  . acetaminophen (TYLENOL) 500 MG tablet Take 500-1,000 mg by mouth every 6 (six) hours as needed (for pain.).  Marland Kitchen aspirin 81 MG chewable tablet Chew 81 mg by mouth at bedtime.   . benazepril (LOTENSIN) 40 MG tablet Take 0.5 tablets (20 mg total) by mouth daily.  . bisoprolol-hydrochlorothiazide (ZIAC) 5-6.25 MG tablet Take 1 tablet by mouth daily.  . calcium carbonate (OSCAL) 1500 (600 Ca) MG TABS tablet Take 600 mg of elemental calcium by mouth 2 (two) times daily with a meal.  . cholecalciferol (VITAMIN D3) 10 MCG (400 UNIT) TABS tablet Take 400 Units by mouth at bedtime.   Marland Kitchen esomeprazole (NEXIUM) 20 MG capsule Take 20 mg by mouth daily as needed (acid reflux/indigestion.).  Marland Kitchen fluticasone (FLONASE) 50 MCG/ACT nasal spray USE TWO SPRAY(S) IN EACH  NOSTRIL DAILY  . ibuprofen (ADVIL) 200 MG tablet Take 400 mg by mouth every 8 (eight) hours as needed (pain.).   Marland Kitchen Multiple Vitamin (MULTIVITAMIN WITH MINERALS) TABS tablet Take 1 tablet by mouth daily. Centrum Silver  . Omega-3 Fatty Acids (FISH OIL) 1000 MG CAPS Take 1,000 mg by mouth 2 (two) times daily.  . pravastatin (PRAVACHOL) 20 MG tablet Take 1 tablet (20 mg total) by mouth daily. (Patient taking differently: Take 20 mg by mouth every evening. )  . Propylene Glycol (SYSTANE BALANCE) 0.6 % SOLN Place 1 drop 3 (three) times daily as needed into both eyes (dry eyes).  . vitamin E 400 UNIT capsule Take 400 Units by mouth daily.   No facility-administered encounter medications on file as of 04/14/2020.    Allergies (verified) Sulfonamide derivatives and Tramadol hcl   History: Past Medical History:  Diagnosis Date  . Abnormal finding on Pap smear    x1  . Diverticulosis   . GERD (gastroesophageal reflux disease)   . Hyperlipidemia   . Hypertension   . Osteopenia    -1.5 @ femoral neck 11/2007  . Reactive airway disease    years ago ; severe asthma attack "bornchitis asthma " per patient, no issues since    Past Surgical History:  Procedure Laterality Date  . BREAST LUMPECTOMY WITH RADIOACTIVE SEED AND SENTINEL LYMPH NODE BIOPSY Left 09/16/2019   Procedure: LEFT BREAST LUMPECTOMY WITH RADIOACTIVE SEED AND SENTINEL LYMPH NODE BIOPSY;  Surgeon: Donnie Mesa, MD;  Location: [facility];  Service:  General;  Laterality: Left;  . CARDIOVASCULAR STRESS TEST  03/05/06  . CARPAL TUNNEL RELEASE  2009    bilaterally  . CATARACT EXTRACTION, BILATERAL     Dr Gershon Crane  . COLONOSCOPY W/ POLYPECTOMY  1998   Tics @ 2004 & 2012; Dr Carlean Purl  . HAND SURGERY     Trigger thumb  . PARTIAL KNEE ARTHROPLASTY Left 04/03/2017   Procedure: Left knee medial unicompartmental arthroplasty;  Surgeon: Gaynelle Arabian, MD;  Location: [facility];  Service: Orthopedics;  Laterality: Left;  Adductor  canal block  . PORTACATH PLACEMENT N/A 09/29/2019   Procedure: INSERTION PORT-A-CATH;  Surgeon: Donnie Mesa, MD;  Location: [facility];  Service: General;  Laterality: N/A;  . TRIGGER FINGER RELEASE Right   . TUBAL LIGATION     Family History  Problem Relation Age of Onset  . Asthma Sister   . Hypertension Sister   . Hypertension Father   . Heart attack Father        MI > 71  . Prostate cancer Father   . Lung cancer Father   . Lung cancer Sister        smoker  . Melanoma Sister   . Osteoporosis Sister   . Glaucoma Sister        X 2  . Diverticulosis Sister        2 sisters  . Lung cancer Sister   . Colon polyps Sister        2 sisters with polyps  . Diverticulitis Sister        1 sister  . Hypertension Mother   . Hypertension Brother   . Skin cancer Sister   . Diabetes Neg Hx   . Stroke Neg Hx   . Breast cancer Neg Hx    Social History   Socioeconomic History  . Marital status: Married    Spouse name: Not on file  . Number of children: 2  . Years of education: Not on file  . Highest education level: Not on file  Occupational History  . Occupation: retired  Tobacco Use  . Smoking status: Former Smoker    Quit date: 05/07/1970    Years since quitting: 49.9  . Smokeless tobacco: Never Used  . Tobacco comment: Smoked as a teen  1962-1972,only up to 3 cigarettes/ day  Vaping Use  . Vaping Use: Never used  Substance and Sexual Activity  . Alcohol use: Yes    Comment: occas  . Drug use: No  . Sexual activity: Not Currently  Other Topics Concern  . Not on file  Social History Narrative  . Not on file   Social Determinants of Health   Financial Resource Strain: Low Risk   . Difficulty of Paying Living Expenses: Not hard at all  Food Insecurity: No Food Insecurity  . Worried About Charity fundraiser in the Last Year: Never true  . Ran Out of Food in the Last Year: Never true  Transportation Needs: No Transportation Needs  . Lack of Transportation (Medical): No   . Lack of Transportation (Non-Medical): No  Physical Activity: Sufficiently Active  . Days of Exercise per Week: 5 days  . Minutes of Exercise per Session: 30 min  Stress: No Stress Concern Present  . Feeling of Stress : Not at all  Social Connections: Socially Integrated  . Frequency of Communication with Friends and Family: More than three times a week  . Frequency of Social Gatherings with Friends and Family: Once a week  .  Attends Religious Services: More than 4 times per year  . Active Member of Clubs or Organizations: No  . Attends Archivist Meetings: More than 4 times per year  . Marital Status: Married    Tobacco Counseling Counseling given: Not Answered Comment: Smoked as a teen  1962-1972,only up to 3 cigarettes/ day   Clinical Intake:  Pre-visit preparation completed: Yes  Pain : No/denies pain Pain Score: 0-No pain     BMI - recorded: 39.73 Nutritional Status: BMI > 30  Obese Nutritional Risks: None Diabetes: No  How often do you need to have someone help you when you read instructions, pamphlets, or other written materials from your doctor or pharmacy?: 1 - Never What is the last grade level you completed in school?: HSG  Diabetic? no  Interpreter Needed?: No  Information entered by :: Lisette Abu, LPN   Activities of Daily Living In your present state of health, do you have any difficulty performing the following activities: 04/14/2020 09/29/2019  Hearing? N N  Vision? N N  Difficulty concentrating or making decisions? N N  Walking or climbing stairs? N N  Dressing or bathing? N N  Doing errands, shopping? N -  Preparing Food and eating ? N -  Using the Toilet? N -  In the past six months, have you accidently leaked urine? N -  Do you have problems with loss of bowel control? N -  Managing your Medications? N -  Managing your Finances? N -  Housekeeping or managing your Housekeeping? N -  Some recent data might be hidden     Patient Care Team: Hoyt Koch, MD as PCP - General (Internal Medicine) Mauro Kaufmann, RN as Oncology Nurse Navigator Rockwell Germany, RN as Oncology Nurse Navigator Donnie Mesa, MD as Consulting Physician (General Surgery) Nicholas Lose, MD as Consulting Physician (Hematology and Oncology) Kyung Rudd, MD as Consulting Physician (Radiation Oncology)  Indicate any recent [place] you may have received from other than [facility] providers in the past year (date may be approximate).     Assessment:   This is a routine wellness examination for [place].  Hearing/Vision screen No exam data present  Dietary issues and exercise activities discussed: Exercise limited by: orthopedic condition(s)  Goals    . patient stated     Increase water intake, I will set out water so I can see it to remind me to drink. Exercise more to help with weight loss.    . Patient Stated     I want to continue to stay on the [employer] program, exercise, stay socially active.      Depression Screen PHQ 2/9 Scores 04/14/2020 04/14/2019 04/02/2018 03/27/2017 03/25/2017 06/23/2015 05/17/2014  PHQ - 2 Score 0 0 0 0 0 0 0  PHQ- 9 Score - - - 0 - - -    Fall Risk Fall Risk  04/14/2020 07/06/2019 04/14/2019 03/31/2019 04/02/2018  Falls in the past year? 0 0 0 0 0  Comment - - - Emmi Telephone Survey: data to providers prior to load -  Number falls in past yr: 0 0 0 - -  Injury with Fall? 0 0 0 - -  Risk for fall due to : No Fall Risks - Impaired balance/gait - -  Follow up Falls evaluation completed - - - -    FALL RISK PREVENTION PERTAINING TO THE HOME:  Any stairs in or around the home? Yes  If so, are there any without handrails?  No  Home free of loose throw rugs in walkways, pet beds, electrical cords, etc? Yes  Adequate lighting in your home to reduce risk of falls? Yes   ASSISTIVE DEVICES UTILIZED TO PREVENT FALLS:  Life alert? No  Use of a cane, walker or w/c? No  Grab bars in the  bathroom? Yes  Shower chair or bench in shower? Yes  Elevated toilet seat or a handicapped toilet? Yes   TIMED UP AND GO:  Was the test performed? No .  Length of time to ambulate 10 feet: 0 sec.   Gait steady and fast without use of assistive device  Cognitive Function: Normal cognitive status assessed by direct observation by this Nurse Health Advisor. No abnormalities found.   MMSE - Mini Mental State Exam 03/27/2017  Orientation to time 5  Orientation to Place 5  Registration 3  Attention/ Calculation 5  Recall 2  Language- name 2 objects 2  Language- repeat 1  Language- follow 3 step command 3  Language- read & follow direction 1  Write a sentence 1  Copy design 1  Total score 29        Immunizations Immunization History  Administered Date(s) Administered  . Fluad Quad(high Dose 65+) 02/18/2020  . Influenza Whole 02/05/2003  . Influenza, High Dose Seasonal PF 02/05/2014, 02/04/2016, 02/25/2018  . Influenza,inj,Quad PF,6+ Mos 02/17/2015  . Influenza,inj,quad, With Preservative 02/25/2018  . Influenza-Unspecified 03/07/2013, 02/11/2017, 03/01/2018, 03/02/2019  . Moderna SARS-COVID-2 Vaccination 06/05/2019, 07/01/2019, 12/25/2019  . Pneumococcal Conjugate-13 03/12/2015  . Pneumococcal Polysaccharide-23 03/20/2007, 01/29/2013  . Td 05/08/1999, 06/09/2009  . Zoster 03/03/2014    TDAP status: Due, Education has been provided regarding the importance of this vaccine. Advised may receive this vaccine at local pharmacy or Health Dept. Aware to provide a copy of the vaccination record if obtained from local pharmacy or Health Dept. Verbalized acceptance and understanding.  Flu Vaccine status: Up to date  Pneumococcal vaccine status: Up to date  Covid-19 vaccine status: Completed vaccines  Qualifies for Shingles Vaccine? Yes   Zostavax completed Yes   Shingrix Completed?: No.    Education has been provided regarding the importance of this vaccine. Patient has been  advised to call insurance company to determine out of pocket expense if they have not yet received this vaccine. Advised may also receive vaccine at local pharmacy or Health Dept. Verbalized acceptance and understanding.  Screening Tests  Health Maintenance  Topic Date Due  . TETANUS/TDAP  06/10/2019  . COVID-19 Vaccine (4 - Booster for Moderna series) 06/26/2020  . COLONOSCOPY  07/26/2020  . INFLUENZA VACCINE  Completed  . DEXA SCAN  Completed  . Hepatitis C Screening  Completed  . PNA vac Low Risk Adult  Completed    Health Maintenance  Health Maintenance Due  Topic Date Due  . TETANUS/TDAP  06/10/2019    Colorectal cancer screening: Type of screening: Colonoscopy. Completed 07/27/2010. Repeat every 10 years  Mammogram status: Completed 4/19/221. Repeat every year  Bone Density status: Completed 07/29/2015. Results reflect: Bone density results: OSTEOPENIA. Repeat every 2 years.  Lung Cancer Screening: (Low Dose CT Chest recommended if Age 56-80 years, 30 pack-year currently smoking OR have quit w/in 15years.) does not qualify.   Lung Cancer Screening Referral: no  Additional Screening:  Hepatitis C Screening: does qualify; Completed yes  Vision Screening: Recommended annual ophthalmology exams for early detection of glaucoma and other disorders of the eye. Is the patient up to date with their annual eye exam?  Yes  Who is the provider or what is the name of the office in which the patient attends annual eye exams? Rutherford Guys, MD. If pt is not established with a provider, would they like to be referred to a provider to establish care? No .   Dental Screening: Recommended annual dental exams for proper oral hygiene  Community Resource Referral / Chronic Care Management: CRR required this visit?  No   CCM required this visit?  No      Plan:     I have personally reviewed and noted the following in the patient's chart:   . Medical and social history . Use of  alcohol, tobacco or illicit drugs  . Current medications and supplements . Functional ability and status . Nutritional status . Physical activity . Advanced directives . List of other physicians . Hospitalizations, surgeries, and ER visits in previous 12 months . Vitals . Screenings to include cognitive, depression, and falls . Referrals and appointments  In addition, I have reviewed and discussed with patient certain preventive protocols, quality metrics, and best practice recommendations. A written personalized care plan for preventive services as well as general preventive health recommendations were provided to patient.     Sheral Flow, LPN   27/0/3500   Nurse Notes: n/a

## 2020-04-18 ENCOUNTER — Encounter: Payer: Self-pay | Admitting: Internal Medicine

## 2020-04-18 ENCOUNTER — Other Ambulatory Visit: Payer: Self-pay

## 2020-04-18 ENCOUNTER — Telehealth: Payer: Self-pay | Admitting: Radiation Oncology

## 2020-04-18 ENCOUNTER — Ambulatory Visit (INDEPENDENT_AMBULATORY_CARE_PROVIDER_SITE_OTHER): Payer: Medicare Other | Admitting: Internal Medicine

## 2020-04-18 VITALS — BP 130/76 | HR 59 | Temp 97.9°F | Ht 61.0 in | Wt 216.2 lb

## 2020-04-18 DIAGNOSIS — Z1211 Encounter for screening for malignant neoplasm of colon: Secondary | ICD-10-CM

## 2020-04-18 DIAGNOSIS — R7301 Impaired fasting glucose: Secondary | ICD-10-CM | POA: Diagnosis not present

## 2020-04-18 DIAGNOSIS — E2839 Other primary ovarian failure: Secondary | ICD-10-CM | POA: Diagnosis not present

## 2020-04-18 DIAGNOSIS — E782 Mixed hyperlipidemia: Secondary | ICD-10-CM | POA: Diagnosis not present

## 2020-04-18 DIAGNOSIS — K219 Gastro-esophageal reflux disease without esophagitis: Secondary | ICD-10-CM

## 2020-04-18 DIAGNOSIS — I10 Essential (primary) hypertension: Secondary | ICD-10-CM

## 2020-04-18 LAB — LIPID PANEL
Cholesterol: 193 mg/dL (ref 0–200)
HDL: 57.9 mg/dL (ref >39.00)
LDL Cholesterol: 98 mg/dL (ref 0–99)
NonHDL: 134.78
Total CHOL/HDL Ratio: 3
Triglycerides: 185 mg/dL — ABNORMAL HIGH (ref 0.0–149.0)
VLDL: 37 mg/dL (ref 0.0–40.0)

## 2020-04-18 LAB — HEMOGLOBIN A1C: Hgb A1c MFr Bld: 5.8 % (ref 4.6–6.5)

## 2020-04-18 MED ORDER — BENAZEPRIL HCL 40 MG PO TABS
20.0000 mg | ORAL_TABLET | Freq: Every day | ORAL | 3 refills | Status: DC
Start: 2020-04-18 — End: 2021-06-06

## 2020-04-18 MED ORDER — PRAVASTATIN SODIUM 20 MG PO TABS
20.0000 mg | ORAL_TABLET | Freq: Every day | ORAL | 3 refills | Status: DC
Start: 2020-04-18 — End: 2021-05-15

## 2020-04-18 MED ORDER — BISOPROLOL-HYDROCHLOROTHIAZIDE 5-6.25 MG PO TABS
1.0000 | ORAL_TABLET | Freq: Every day | ORAL | 3 refills | Status: DC
Start: 2020-04-18 — End: 2020-06-23

## 2020-04-18 NOTE — Patient Instructions (Signed)
We will check the labs today. 

## 2020-04-18 NOTE — Telephone Encounter (Signed)
  Radiation Oncology         [phone] ________________________________  Name: Kim Fuller MRN: [id number]  Date of Service: 04/27/20  DOB: May 11, 1945  Post Treatment Telephone Note  Diagnosis:   Stage IB, pT1bN0M0 grade 3 triple negative invasive ductal carcinoma of the left breast.   Interval Since Last Radiation:  5  weeks   02/25/20-03/23/20: The left breast was treated to 42.56 Gy in 16 fractions followed by an 8 Gy boost in 4 fractions.  Narrative:  The patient was contacted today for routine follow-up. During treatment she did very well with radiotherapy and did not have significant desquamation. Her skin is almost completely recovered with only minimal hyperpigmentation reported. She reports she has had some hand stiffness in the morning. She is trying tylenol for this which helps some but she denies burning or numbness of the area. No other complaints are verbalized.  Impression/Plan: 1. Stage IB, pT1bN0M0 grade 3 triple negative invasive ductal carcinoma of the left breast.  The patient has been doing well since completion of radiotherapy. We discussed that we would be happy to continue to follow her as needed, but she will also continue to follow up with Dr. Lindi Adie in medical oncology. She was counseled on skin care as well as measures to avoid sun exposure to this area.  2. Survivorship. We discussed the importance of survivorship evaluation and encouraged her to attend her upcoming visit with that clinic.    Carola Rhine, PAC

## 2020-04-18 NOTE — Progress Notes (Signed)
   Subjective:   Patient ID: Kim Fuller, female    DOB: [birth year], 74 y.o.   MRN: [id number]  HPI The patient is a 74 YO female coming in for follow up cholesterol (taking pravastatin, denies side effects, denies chest pains or stroke symptoms), and blood pressure (taking bisoprolol/hctz and benazepril, denies side effects, denies chest pains or headaches), and GERD (taking nexium daily, denies side effects, denies current breakthrough symptoms).   Review of Systems  Constitutional: Negative.   HENT: Negative.   Eyes: Negative.   Respiratory: Negative for cough, chest tightness and shortness of breath.   Cardiovascular: Negative for chest pain, palpitations and leg swelling.  Gastrointestinal: Negative for abdominal distention, abdominal pain, constipation, diarrhea, nausea and vomiting.  Musculoskeletal: Negative.   Skin: Negative.   Neurological: Negative.   Psychiatric/Behavioral: Negative.     Objective:  Physical Exam Constitutional:      Appearance: She is well-developed and well-nourished.  HENT:     Head: Normocephalic and atraumatic.  Eyes:     Extraocular Movements: EOM normal.  Cardiovascular:     Rate and Rhythm: Normal rate and regular rhythm.  Pulmonary:     Effort: Pulmonary effort is normal. No respiratory distress.     Breath sounds: Normal breath sounds. No wheezing or rales.  Abdominal:     General: Bowel sounds are normal. There is no distension.     Palpations: Abdomen is soft.     Tenderness: There is no abdominal tenderness. There is no rebound.  Musculoskeletal:        General: No edema.     Cervical back: Normal range of motion.  Skin:    General: Skin is warm and dry.  Neurological:     Mental Status: She is alert and oriented to person, place, and time.     Coordination: Coordination normal.  Psychiatric:        Mood and Affect: Mood and affect normal.     Vitals:   04/18/20 1318  BP: 130/76  Pulse: (!) 59  Temp: 97.9 F (36.6  C)  TempSrc: Oral  SpO2: 98%  Weight: 216 lb 3.2 oz (98.1 kg)  Height: 5\' 1"  (1.549 m)    This visit occurred during the SARS-CoV-2 public health emergency.  Safety protocols were in place, including screening questions prior to the visit, additional usage of staff PPE, and extensive cleaning of exam room while observing appropriate contact time as indicated for disinfecting solutions.   Assessment & Plan:

## 2020-04-19 NOTE — Assessment & Plan Note (Signed)
BP at goal on bisoprolol/hctz and benazepril. Recent CMP without indication for change.

## 2020-04-19 NOTE — Assessment & Plan Note (Signed)
Checking lipid panel and adjust pravastatin as needed.  

## 2020-04-19 NOTE — Assessment & Plan Note (Signed)
Weight stable and discussed diet and exercise.

## 2020-04-19 NOTE — Assessment & Plan Note (Signed)
Taking nexium daily and will continue. Counseled about diet changes.

## 2020-05-18 ENCOUNTER — Other Ambulatory Visit: Payer: Medicare Other

## 2020-05-18 ENCOUNTER — Inpatient Hospital Stay: Admission: RE | Admit: 2020-05-18 | Payer: Medicare Other | Source: Ambulatory Visit

## 2020-05-23 ENCOUNTER — Inpatient Hospital Stay: Admission: RE | Admit: 2020-05-23 | Payer: Medicare Other | Source: Ambulatory Visit

## 2020-05-27 ENCOUNTER — Inpatient Hospital Stay: Admission: RE | Admit: 2020-05-27 | Payer: Medicare Other | Source: Ambulatory Visit

## 2020-05-30 ENCOUNTER — Other Ambulatory Visit: Payer: Self-pay

## 2020-05-30 ENCOUNTER — Ambulatory Visit (INDEPENDENT_AMBULATORY_CARE_PROVIDER_SITE_OTHER)
Admission: RE | Admit: 2020-05-30 | Discharge: 2020-05-30 | Disposition: A | Discharge status: Home / Self Care | Payer: Medicare Other | Source: Ambulatory Visit | Attending: Internal Medicine | Admitting: Internal Medicine

## 2020-05-30 DIAGNOSIS — E2839 Other primary ovarian failure: Secondary | ICD-10-CM | POA: Diagnosis not present

## 2020-06-01 ENCOUNTER — Other Ambulatory Visit: Payer: Self-pay

## 2020-06-02 ENCOUNTER — Ambulatory Visit (INDEPENDENT_AMBULATORY_CARE_PROVIDER_SITE_OTHER): Payer: Medicare Other | Admitting: Internal Medicine

## 2020-06-02 ENCOUNTER — Ambulatory Visit (INDEPENDENT_AMBULATORY_CARE_PROVIDER_SITE_OTHER): Payer: Medicare Other

## 2020-06-02 ENCOUNTER — Encounter: Payer: Self-pay | Admitting: Internal Medicine

## 2020-06-02 VITALS — BP 130/80 | HR 56 | Temp 98.9°F | Resp 18 | Ht 61.0 in | Wt 215.8 lb

## 2020-06-02 DIAGNOSIS — M545 Low back pain, unspecified: Secondary | ICD-10-CM

## 2020-06-02 DIAGNOSIS — M47812 Spondylosis without myelopathy or radiculopathy, cervical region: Secondary | ICD-10-CM | POA: Diagnosis not present

## 2020-06-02 DIAGNOSIS — M1612 Unilateral primary osteoarthritis, left hip: Secondary | ICD-10-CM | POA: Diagnosis not present

## 2020-06-02 MED ORDER — METHYLPREDNISOLONE ACETATE 40 MG/ML IJ SUSP
40.0000 mg | Freq: Once | INTRAMUSCULAR | Status: AC
  Administered 2020-06-02: 40 mg via INTRAMUSCULAR

## 2020-06-02 NOTE — Progress Notes (Signed)
   Subjective:   Patient ID: Kim Fuller, female    DOB: Mar 31, 1946, 75 y.o.   MRN: [id number]  HPI The patient is a 75 YO female coming in for worsening low back and left hip pain. She is also having right hand pain. Started with the recent cold weather. The back and hip are gradually worsening over the past several months. Prior x-ray left hip mild arthritis 2016. Denies injury or overuse recently. Taking tylenol and ibuprofen alternating and this is helping her get through the day but not great relief. She has worsening pain in the back with prolonged walking or standing. Denies numbness or weakness in the legs.   Review of Systems  Constitutional: Positive for activity change. Negative for appetite change, chills, fatigue, fever and unexpected weight change.  Respiratory: Negative.   Cardiovascular: Negative.   Gastrointestinal: Negative.   Musculoskeletal: Positive for arthralgias, back pain and myalgias. Negative for gait problem and joint swelling.  Skin: Negative.   Neurological: Negative.     Objective:  Physical Exam Constitutional:      Appearance: She is well-developed and well-nourished.  HENT:     Head: Normocephalic and atraumatic.  Eyes:     Extraocular Movements: EOM normal.  Cardiovascular:     Rate and Rhythm: Normal rate and regular rhythm.  Pulmonary:     Effort: Pulmonary effort is normal. No respiratory distress.     Breath sounds: Normal breath sounds. No wheezing or rales.  Abdominal:     General: Bowel sounds are normal. There is no distension.     Palpations: Abdomen is soft.     Tenderness: There is no abdominal tenderness. There is no rebound.  Musculoskeletal:        General: Tenderness present. No edema.     Cervical back: Normal range of motion.  Skin:    General: Skin is warm and dry.  Neurological:     Mental Status: She is alert and oriented to person, place, and time.     Coordination: Coordination normal.  Psychiatric:        Mood  and Affect: Mood and affect normal.     Vitals:   06/02/20 1535  BP: 130/80  Pulse: (!) 56  Resp: 18  Temp: 98.9 F (37.2 C)  TempSrc: Oral  SpO2: 96%  Weight: 215 lb 12.8 oz (97.9 kg)  Height: 5\' 1"  (1.549 m)    This visit occurred during the SARS-CoV-2 public health emergency.  Safety protocols were in place, including screening questions prior to the visit, additional usage of staff PPE, and extensive cleaning of exam room while observing appropriate contact time as indicated for disinfecting solutions.   Assessment & Plan:  Depo-medrol 40 mg IM given at visit

## 2020-06-02 NOTE — Patient Instructions (Signed)
We have given you the steroid shot.   We will check the x-ray of the low back and hip.

## 2020-06-02 NOTE — Assessment & Plan Note (Signed)
Given depo-medrol 40 mg IM at visit. Checking x-ray lumbar and left hip to evaluate for changes. Plan based on imaging findings.

## 2020-06-03 ENCOUNTER — Telehealth: Payer: Self-pay | Admitting: Internal Medicine

## 2020-06-03 NOTE — Progress Notes (Signed)
  Chronic Care Management   Note  2/29/7989 Name: Kim Fuller MRN: [id number] DOB: [birth year]  [name] is a 75 y.o. year old female who is a primary care patient of Hoyt Koch, MD. I reached out to Madolyn Frieze by phone today in response to a referral sent by [name]'s PCP, Hoyt Koch, MD.   [name] was given information about Chronic Care Management services today including:  1. CCM service includes personalized support from designated clinical staff supervised by her physician, including individualized plan of care and coordination with other care providers 2. 24/7 contact phone numbers for assistance for urgent and routine care needs. 3. Service will only be billed when office clinical staff spend 20 minutes or more in a month to coordinate care. 4. Only one practitioner may furnish and bill the service in a calendar month. 5. The patient may stop CCM services at any time (effective at the end of the month) by phone call to the office staff.   Patient agreed to services and verbal consent obtained.   Follow up plan:   Carley Perdue [employer] Scheduler

## 2020-06-23 ENCOUNTER — Encounter: Payer: Self-pay | Admitting: Internal Medicine

## 2020-06-23 ENCOUNTER — Other Ambulatory Visit: Payer: Self-pay | Admitting: Internal Medicine

## 2020-07-04 ENCOUNTER — Other Ambulatory Visit: Payer: Self-pay

## 2020-07-04 ENCOUNTER — Ambulatory Visit: Payer: Medicare Other | Attending: Surgery

## 2020-07-04 DIAGNOSIS — Z483 Aftercare following surgery for neoplasm: Secondary | ICD-10-CM | POA: Insufficient documentation

## 2020-07-04 NOTE — Therapy (Signed)
[place], [place], [address] Phone: [phone]   Fax:  [phone]  Physical Therapy Treatment  Patient Details  Name: Kim Fuller MRN: [id number] Date of Birth: [birth year] Referring Provider (PT): Dr. Donnie Mesa   Encounter Date: 07/04/2020   PT End of Session - 07/04/20 0943    Visit Number 2   # unchanged due to screen only   Number of Visits 2    Date for PT Re-Evaluation 11/04/19    PT Start Time 0934    PT Stop Time 0943    PT Time Calculation (min) 9 min    Activity Tolerance Patient tolerated treatment well    Behavior During Therapy [facility] for tasks assessed/performed           Past Medical History:  Diagnosis Date  . Abnormal finding on Pap smear    x1  . Diverticulosis   . GERD (gastroesophageal reflux disease)   . Hyperlipidemia   . Hypertension   . Osteopenia    -1.5 @ femoral neck 11/2007  . Reactive airway disease    years ago ; severe asthma attack "bornchitis asthma " per patient, no issues since     Past Surgical History:  Procedure Laterality Date  . BREAST LUMPECTOMY WITH RADIOACTIVE SEED AND SENTINEL LYMPH NODE BIOPSY Left 09/16/2019   Procedure: LEFT BREAST LUMPECTOMY WITH RADIOACTIVE SEED AND SENTINEL LYMPH NODE BIOPSY;  Surgeon: Donnie Mesa, MD;  Location: [facility];  Service: General;  Laterality: Left;  . CARDIOVASCULAR STRESS TEST  03/05/06  . CARPAL TUNNEL RELEASE  2009    bilaterally  . CATARACT EXTRACTION, BILATERAL     Dr Gershon Crane  . COLONOSCOPY W/ POLYPECTOMY  1998   Tics @ 2004 & 2012; Dr Carlean Purl  . HAND SURGERY     Trigger thumb  . PARTIAL KNEE ARTHROPLASTY Left 04/03/2017   Procedure: Left knee medial unicompartmental arthroplasty;  Surgeon: Gaynelle Arabian, MD;  Location: [facility];  Service: Orthopedics;  Laterality: Left;  Adductor canal block  . PORTACATH PLACEMENT N/A 09/29/2019   Procedure: INSERTION PORT-A-CATH;   Surgeon: Donnie Mesa, MD;  Location: [facility];  Service: General;  Laterality: N/A;  . TRIGGER FINGER RELEASE Right   . TUBAL LIGATION      There were no vitals filed for this visit.   Subjective Assessment - 07/04/20 0941    Subjective Pt returns for 3 month L-Dex screen.    Pertinent History Patient was diagnosed on 08/19/2019 with left triple negative invasive ductal carcinoma breast cancer. It is grade III with a Ki67 of 50%. Patient reports she underwent a left lumpectomy and sentinel node biopsy (4 negative nodes) on 09/16/2019. She had a partial left knee replacement in 2018 and some chronic low back pain.                  L-DEX FLOWSHEETS - 07/04/20 0900      L-DEX LYMPHEDEMA SCREENING   Measurement Type Unilateral    L-DEX MEASUREMENT EXTREMITY Upper Extremity    POSITION  Standing    DOMINANT SIDE Right    At Risk Side Left    BASELINE SCORE (UNILATERAL) 6    L-DEX SCORE (UNILATERAL) 8.7    VALUE CHANGE (UNILAT) 2.7                                  PT Long Term Goals -  11/02/19 1128      PT LONG TERM GOAL #1   Title Patient will demonstrate she has regained full shoulder ROM and function post operatively compared to baselines.    Time 8    Period Weeks    Status Partially Met                 Plan - 07/04/20 0944    Clinical Impression Statement Pt returns for her 3 month L-dex screen. Her change from baseline of 2.7 is WNLs so no further treatment is required at this time except to cont every 3 month L-Dex screens which pt is agreeable to.    PT Next Visit Plan Cont every 3 month L-Dex screens for 2 years (until ~09/15/2021), then every 6 months.    Consulted and Agree with Plan of Care Patient           Patient will benefit from skilled therapeutic intervention in order to improve the following deficits and impairments:     Visit Diagnosis: Aftercare following surgery for neoplasm     Problem List Patient Active  Problem List   Diagnosis Date Noted  . Port-A-Cath in place 10/27/2019  . Malignant neoplasm of upper-outer quadrant of left breast in female, estrogen receptor negative ([place]) 09/08/2019  . Acute left-sided low back pain without sciatica 07/06/2019  . Morbid obesity ([place]) 04/24/2019  . Trigger finger, right middle finger 07/02/2017  . Chronic shoulder bursitis, left 11/19/2016  . OA (osteoarthritis) of knee 11/18/2015  . Routine general medical examination at a health care facility 02/20/2015  . GERD 12/31/2007  . Osteopenia 10/31/2007  . HYPERLIPIDEMIA 03/20/2007  . Essential hypertension 03/20/2007    Otelia Limes, PTA 07/04/2020, 9:45 AM  [place], [place], [address] Phone: [phone]   Fax:  [phone]  Name: Kim Fuller MRN: [id number] Date of Birth: August 29, 1945

## 2020-07-07 ENCOUNTER — Telehealth: Payer: Self-pay | Admitting: Pharmacist

## 2020-07-07 NOTE — Progress Notes (Signed)
Chronic Care Management Pharmacy Assistant   Name: Ngina Royer  MRN: [id number] DOB: 06/07/1945  Reason for Encounter: Initial Questions  PCP : Hoyt Koch, MD  Allergies:   Allergies  Allergen Reactions   Sulfonamide Derivatives Rash    Rash Because of a history of documented adverse serious drug reaction;Medi Alert bracelet  is recommended   Tramadol Hcl Other (See Comments)    REACTION: cold sweats, weak, fatigue    Medications: Outpatient Encounter Medications as of 07/07/2020  Medication Sig   acetaminophen (TYLENOL) 500 MG tablet Take 500-1,000 mg by mouth every 6 (six) hours as needed (for pain.).   aspirin 81 MG chewable tablet Chew 81 mg by mouth at bedtime.    benazepril (LOTENSIN) 40 MG tablet Take 0.5 tablets (20 mg total) by mouth daily.   bisoprolol-hydrochlorothiazide (ZIAC) 5-6.25 MG tablet Take 1 tablet by mouth once daily   calcium carbonate (OSCAL) 1500 (600 Ca) MG TABS tablet Take 600 mg of elemental calcium by mouth 2 (two) times daily with a meal.   cholecalciferol (VITAMIN D3) 10 MCG (400 UNIT) TABS tablet Take 400 Units by mouth at bedtime.    esomeprazole (NEXIUM) 20 MG capsule Take 20 mg by mouth daily as needed (acid reflux/indigestion.).   fluticasone (FLONASE) 50 MCG/ACT nasal spray USE TWO SPRAY(S) IN EACH NOSTRIL DAILY   ibuprofen (ADVIL) 200 MG tablet Take 400 mg by mouth every 8 (eight) hours as needed (pain.).    Multiple Vitamin (MULTIVITAMIN WITH MINERALS) TABS tablet Take 1 tablet by mouth daily. Centrum Silver   Omega-3 Fatty Acids (FISH OIL) 1000 MG CAPS Take 1,000 mg by mouth 2 (two) times daily.   pravastatin (PRAVACHOL) 20 MG tablet Take 1 tablet (20 mg total) by mouth daily.   Propylene Glycol 0.6 % SOLN Place 1 drop 3 (three) times daily as needed into both eyes (dry eyes).   vitamin E 400 UNIT capsule Take 400 Units by mouth daily.   No facility-administered encounter medications on file as of  07/07/2020.    Current Diagnosis: Patient Active Problem List   Diagnosis Date Noted   Port-A-Cath in place 10/27/2019   Malignant neoplasm of upper-outer quadrant of left breast in female, estrogen receptor negative ([place]) 09/08/2019   Acute left-sided low back pain without sciatica 07/06/2019   Morbid obesity ([place]) 04/24/2019   Trigger finger, right middle finger 07/02/2017   Chronic shoulder bursitis, left 11/19/2016   OA (osteoarthritis) of knee 11/18/2015   Routine general medical examination at a health care facility 02/20/2015   GERD 12/31/2007   Osteopenia 10/31/2007   HYPERLIPIDEMIA 03/20/2007   Essential hypertension 03/20/2007    Goals Addressed   None     Follow-Up:  Pharmacist Review   Have you seen any other providers since your last visit? The patient has not seen any other providers since Dr. Sharlet Salina  Any changes in your medications or health? The patient states that she has not had any changes in her medications, but is having some weakness in her right arm  Any side effects from any medications? The patient states she does not have any side effects to medications  Do you have an symptoms or problems not managed by your medications? The patient states that she is not having any symptoms that are not being managed by medications  Any concerns about your health right now? The patient states that she is has arthritis in her hands but the last 3 fingers are worse. She  said that she is taking tylenol but it does not help   Has your provider asked that you check blood pressure, blood sugar, or follow special diet at home? The patient states that she does not check blood pressure daily maybe once a week if that, she is not a diabetic and not following a special diet  Do you get any type of exercise on a regular basis? The patient states she does some arm,knee and neck exercises at home  Can you think of a goal you would like to reach for your health? The  patient states her goal is to lose weight and start walking  Do you have any problems getting your medications? The patient states that she has no problems with getting medications from pharmacy  Is there anything that you would like to discuss during the appointment? The patient would like to discuss medication options for arthritis   Patient coming to office for first visit asked to bring medications and supplements to appointment    Wendy Poet, [place] [phone]   Time spent:30

## 2020-07-08 DIAGNOSIS — C50912 Malignant neoplasm of unspecified site of left female breast: Secondary | ICD-10-CM | POA: Diagnosis not present

## 2020-07-11 ENCOUNTER — Other Ambulatory Visit: Payer: Self-pay

## 2020-07-11 ENCOUNTER — Ambulatory Visit (INDEPENDENT_AMBULATORY_CARE_PROVIDER_SITE_OTHER): Payer: Medicare Other | Admitting: Pharmacist

## 2020-07-11 DIAGNOSIS — E782 Mixed hyperlipidemia: Secondary | ICD-10-CM | POA: Diagnosis not present

## 2020-07-11 DIAGNOSIS — K219 Gastro-esophageal reflux disease without esophagitis: Secondary | ICD-10-CM

## 2020-07-11 DIAGNOSIS — M170 Bilateral primary osteoarthritis of knee: Secondary | ICD-10-CM

## 2020-07-11 DIAGNOSIS — I10 Essential (primary) hypertension: Secondary | ICD-10-CM

## 2020-07-11 DIAGNOSIS — M8580 Other specified disorders of bone density and structure, unspecified site: Secondary | ICD-10-CM

## 2020-07-11 NOTE — Progress Notes (Signed)
Chronic Care Management Pharmacy Note  06/10/5359 Name:  Kim Fuller MRN:  [id number] DOB:  [id number]  Subjective: Kim Fuller is an 75 y.o. year old female who is a primary patient of Hoyt Koch, MD.  The CCM team was consulted for assistance with disease management and care coordination needs.    Engaged with patient face to face for initial visit in response to provider referral for pharmacy case management and/or care coordination services.   Consent to Services:  The patient was given information about Chronic Care Management services, agreed to services, and gave verbal consent prior to initiation of services.  Please see initial visit note for detailed documentation.   Patient Care Team: Hoyt Koch, MD as PCP - General (Internal Medicine) Mauro Kaufmann, RN as Oncology Nurse Navigator Rockwell Germany, RN as Oncology Nurse Navigator Donnie Mesa, MD as Consulting Physician (General Surgery) Nicholas Lose, MD as Consulting Physician (Hematology and Oncology) Kyung Rudd, MD as Consulting Physician (Radiation Oncology) Rutherford Guys, MD as Consulting Physician (Ophthalmology) Charlton Haws, [facility] as Pharmacist (Pharmacist)  Recent office visits: 06/02/20 Dr Sharlet Salina OV: Back pain w/ sciatica. Given steroid shot. Xrays showed mild arthritis in hips, severe arthritis in back.  04/18/20 Dr Sharlet Salina OV: chronic f/u, labs stable, DEXA stable. NO med changes.  Recent consult visits: 03/23/20 final chemotherapy treatment.  Hospital visits: None in previous 6 months  Objective:  Lab Results  Component Value Date   CREATININE 0.72 01/29/2020   BUN 12 01/29/2020   GFR 72.18 04/24/2019   GFRNONAA >60 01/29/2020   GFRAA >60 01/29/2020   NA 139 01/29/2020   K 3.7 01/29/2020   CALCIUM 9.1 01/29/2020   CO2 29 01/29/2020    Lab Results  Component Value Date/Time   HGBA1C 5.8 04/18/2020 02:00 PM   HGBA1C 5.7 04/24/2019 09:32 AM    GFR 72.18 04/24/2019 09:32 AM   GFR 85.75 04/21/2018 09:59 AM    Last diabetic Eye exam: No results found for: HMDIABEYEEXA  Last diabetic Foot exam: No results found for: HMDIABFOOTEX   Lab Results  Component Value Date   CHOL 193 04/18/2020   HDL 57.90 04/18/2020   LDLCALC 98 04/18/2020   LDLDIRECT 86.0 04/24/2019   TRIG 185.0 (H) 04/18/2020   CHOLHDL 3 04/18/2020    Hepatic Function Latest Ref Rng & Units 01/29/2020 01/08/2020 12/17/2019  Total Protein 6.5 - 8.1 g/dL 7.2 7.0 7.1  Albumin 3.5 - 5.0 g/dL 3.3(L) 3.4(L) 3.5  AST 15 - 41 U/L 23 20 20   ALT 0 - 44 U/L 20 15 17   Alk Phosphatase 38 - 126 U/L 63 61 61  Total Bilirubin 0.3 - 1.2 mg/dL 0.5 0.5 0.5  Bilirubin, Direct 0.0 - 0.3 mg/dL - - -    Lab Results  Component Value Date/Time   TSH 1.56 02/05/2014 12:15 PM   TSH 1.32 01/29/2013 11:46 AM    CBC Latest Ref Rng & Units 01/29/2020 01/08/2020 12/17/2019  WBC 4.0 - 10.5 K/uL 6.0 4.5 3.4(L)  Hemoglobin 12.0 - 15.0 g/dL 13.2 12.7 13.1  Hematocrit 36.0 - 46.0 % 40.5 39.4 40.2  Platelets 150 - 400 K/uL 180 255 195    Lab Results  Component Value Date/Time   VD25OH 64.37 02/05/2014 12:15 PM   VD25OH 50 11/01/2011 11:43 AM   VD25OH 51 08/10/2010 09:06 AM    Clinical ASCVD: No  The 10-year ASCVD risk score Mikey Bussing DC Jr., et al., 2013) is: 21.6%   Values  used to calculate the score:     Age: 68 years     Sex: Female     Is Non-Hispanic African American: No     Diabetic: No     Tobacco smoker: No     Systolic Blood Pressure: 299 mmHg     Is BP treated: Yes     HDL Cholesterol: 57.9 mg/dL     Total Cholesterol: 193 mg/dL    Depression screen [facility] 2/9 04/14/2020 04/14/2019 04/02/2018  Decreased Interest 0 0 0  Down, Depressed, Hopeless 0 0 0  PHQ - 2 Score 0 0 0  Altered sleeping - - -  Tired, decreased energy - - -  Change in appetite - - -  Feeling bad or failure about yourself  - - -  Trouble concentrating - - -  Moving slowly or fidgety/restless - - -  Suicidal  thoughts - - -  PHQ-9 Score - - -  Difficult doing work/chores - - -  Some recent data might be hidden    DEXA 05/30/2020 Results:  Lumbar spine L1-L4 Femoral neck (FN) 33% distal radius  T-score 0.3 RFN: -1.8 LFN: -2.1 n/a  Change in BMD from previous DXA test (%) Up 4.3% Down 8.1% n/a  (*) statistically significant  Assessment: the BMD is low according to the [facility] classification for osteoporosis (see below). Fracture risk: moderate FRAX score: 10 year major osteoporotic risk: 12.0%. 10 year hip fracture risk: 3.0%. The thresholds for treatment are 20% and 3%, respectively. Comments: the technical quality of the study is good. Evaluation for secondary causes should be considered if clinically indicated.  Recommend optimizing calcium (1200 mg/day) and vitamin D (800 IU/day) intake.  Followup: Repeat BMD is appropriate after 2 years or after 1-2 years if starting treatment.   Social History   Tobacco Use  Smoking Status Former Smoker  . Quit date: 05/07/1970  . Years since quitting: 50.2  Smokeless Tobacco Never Used  Tobacco Comment   Smoked as a teen  1962-1972,only up to 3 cigarettes/ day   BP Readings from Last 3 Encounters:  06/02/20 130/80  04/18/20 130/76  04/14/20 (!) 160/80   Pulse Readings from Last 3 Encounters:  06/02/20 (!) 56  04/18/20 (!) 59  04/14/20 (!) 55   Wt Readings from Last 3 Encounters:  06/02/20 215 lb 12.8 oz (97.9 kg)  04/18/20 216 lb 3.2 oz (98.1 kg)  04/14/20 217 lb 3.2 oz (98.5 kg)    Assessment/Interventions: Review of patient past medical history, allergies, medications, health status, including review of consultants reports, laboratory and other test data, was performed as part of comprehensive evaluation and provision of chronic care management services.   SDOH:  (Social Determinants of Health) assessments and interventions performed: Yes  SDOH Screenings   Alcohol Screen: Low Risk   . Last Alcohol Screening Score (AUDIT): 0   Depression (PHQ2-9): Low Risk   . PHQ-2 Score: 0  Financial Resource Strain: Low Risk   . Difficulty of Paying Living Expenses: Not hard at all  Food Insecurity: No Food Insecurity  . Worried About Charity fundraiser in the Last Year: Never true  . Ran Out of Food in the Last Year: Never true  Housing: Low Risk   . Last Housing Risk Score: 0  Physical Activity: Sufficiently Active  . Days of Exercise per Week: 5 days  . Minutes of Exercise per Session: 30 min  Social Connections: Socially Integrated  . Frequency of Communication with Friends and  Family: More than three times a week  . Frequency of Social Gatherings with Friends and Family: Once a week  . Attends Religious Services: More than 4 times per year  . Active Member of Clubs or Organizations: No  . Attends Archivist Meetings: More than 4 times per year  . Marital Status: Married  Stress: No Stress Concern Present  . Feeling of Stress : Not at all  Tobacco Use: Medium Risk  . Smoking Tobacco Use: Former Smoker  . Smokeless Tobacco Use: Never Used  Transportation Needs: No Transportation Needs  . Lack of Transportation (Medical): No  . Lack of Transportation (Non-Medical): No    CCM Care Plan  Allergies  Allergen Reactions  . Sulfonamide Derivatives Rash    Rash Because of a history of documented adverse serious drug reaction;Medi Alert bracelet  is recommended  . Tramadol Hcl Other (See Comments)    REACTION: cold sweats, weak, fatigue    Medications Reviewed Today    Reviewed by Charlton Haws, [facility] (Pharmacist) on 07/12/20 at 1544  Med List Status: <None>  Medication Order Taking? Sig Documenting Provider Last Dose Status Informant  acetaminophen (TYLENOL) 500 MG tablet [id number] Yes Take 500-1,000 mg by mouth every 6 (six) hours as needed (for pain.). [provider] Taking Active Self  aspirin 81 MG chewable tablet [id number] Yes Chew 81 mg by mouth at bedtime.  [provider] Taking Active Self  benazepril (LOTENSIN) 40 MG tablet [id number] Yes Take 0.5 tablets (20 mg total) by mouth daily. Hoyt Koch, MD Taking Active   bisoprolol-hydrochlorothiazide [facility] 5-6.25 MG tablet [id number] Yes Take 1 tablet by mouth once daily Hoyt Koch, MD Taking Active   calcium carbonate (OSCAL) 1500 (600 Ca) MG TABS tablet [id number] Yes Take 600 mg of elemental calcium by mouth 2 (two) times daily with a meal. [provider] Taking Active Self  cholecalciferol (VITAMIN D3) 10 MCG (400 UNIT) TABS tablet [id number] Yes Take 400 Units by mouth at bedtime.  [provider] Taking Active Self  esomeprazole (NEXIUM) 20 MG capsule [id number] Yes Take 20 mg by mouth daily as needed (acid reflux/indigestion.). [provider] Taking Active Self  fluticasone (FLONASE) 50 MCG/ACT nasal spray [id number] Yes USE TWO SPRAY(S) IN EACH NOSTRIL DAILY Hoyt Koch, MD Taking Active   ibuprofen (ADVIL) 200 MG tablet [id number] Yes Take 400 mg by mouth every 8 (eight) hours as needed (pain.).  [provider] Taking Active Self  Multiple Vitamin (MULTIVITAMIN WITH MINERALS) TABS tablet [id number] Yes Take 1 tablet by mouth daily. Centrum Silver [provider] Taking Active Self  Omega-3 Fatty Acids (FISH OIL) 1000 MG CAPS [id number] Yes Take 1,000 mg by mouth 2 (two) times daily. [provider] Taking Active Self  pravastatin (PRAVACHOL) 20 MG tablet [id number] Yes Take 1 tablet (20 mg total) by mouth daily. Hoyt Koch, MD Taking Active   Propylene Glycol 0.6 % SOLN [id number] Yes Place 1 drop 3 (three) times daily as needed into both eyes (dry eyes). [provider] Taking Active Self  vitamin E 400 UNIT capsule [id number] Yes Take 400 Units by mouth daily. [provider] Taking Active Self          Patient Active Problem List   Diagnosis Date Noted  . Port-A-Cath in place 10/27/2019  .  Malignant neoplasm of upper-outer quadrant of left breast in female, estrogen receptor negative ([place]) 09/08/2019  . Acute left-sided low back pain without sciatica  07/06/2019  . Morbid obesity ([place]) 04/24/2019  . Trigger finger, right middle finger 07/02/2017  . Chronic shoulder bursitis, left 11/19/2016  . OA (osteoarthritis) of knee 11/18/2015  . Routine general medical examination at a health care facility 02/20/2015  . GERD 12/31/2007  . Osteopenia 10/31/2007  . HYPERLIPIDEMIA 03/20/2007  . Essential hypertension 03/20/2007    Immunization History  Administered Date(s) Administered  . Fluad Quad(high Dose 65+) 02/18/2020  . Influenza Whole 02/05/2003  . Influenza, High Dose Seasonal PF 02/05/2014, 02/04/2016, 02/25/2018  . Influenza,inj,Quad PF,6+ Mos 02/17/2015  . Influenza,inj,quad, With Preservative 02/25/2018  . Influenza-Unspecified 03/07/2013, 02/11/2017, 03/01/2018, 03/02/2019  . Moderna Sars-Covid-2 Vaccination 06/05/2019, 07/01/2019, 12/25/2019  . Pneumococcal Conjugate-13 03/12/2015  . Pneumococcal Polysaccharide-23 03/20/2007, 01/29/2013  . Td 05/08/1999, 06/09/2009  . Zoster 03/03/2014    Conditions to be addressed/monitored:  Hypertension, Hyperlipidemia, GERD, Osteopenia and Osteoarthritis  Care Plan : [place]  Updates made by Charlton Haws, [place] since 07/12/2020 12:00 AM    Problem: Hypertension, Hyperlipidemia, GERD, Osteopenia and Osteoarthritis   Priority: High    Long-Range Goal: Disease management   Start Date: 07/12/2020  Expected End Date: 07/12/2021  This Visit's Progress: On track  Priority: High  Note:   Current Barriers:  . Unable to independently monitor therapeutic efficacy  Pharmacist Clinical Goal(s):  Marland Kitchen Over the next 365 days, patient will achieve adherence to monitoring guidelines and medication adherence to achieve therapeutic efficacy through collaboration with PharmD and provider.   Interventions: . 1:1  collaboration with Hoyt Koch, MD regarding development and update of comprehensive plan of care as evidenced by provider attestation and co-signature . Inter-disciplinary care team collaboration (see longitudinal plan of care) . Comprehensive medication review performed; medication list updated in electronic medical record  Hypertension (BP goal <130/80) -Controlled - BP at goal in recent office visits; denies side effects -Current treatment: . Benazepril 40 mg - 1/2 tab daily . Bisoprolol-HCTZ 5-6.25 mg daily -Denies hypotensive/hypertensive symptoms -Educated on BP goals and benefits of medications for prevention of heart attack, stroke and kidney damage; Daily salt intake goal < 2300 mg; Exercise goal of 150 minutes per week; Importance of home blood pressure monitoring; -Counseled to monitor BP at home weekly, document, and provide log at future appointments -Recommended to continue current medication  Hyperlipidemia: (LDL goal < 100) -Controlled -Current treatment: . Pravastatin 20 mg daily . Omega-3 FA (Fish oil) 1000 mg BID . Aspirin 81 m daily -Educated on Cholesterol goals;  Benefits of statin for ASCVD risk reduction; -Recommended to continue current medication  Osteopenia (Goal: maintain/improve bone density, prevent fractures) -Controlled -Last DEXA Scan: 05/31/2020  10-year probability of major osteoporotic fracture: 12%  10-year probability of hip fracture: 3% -Patient is not a candidate for pharmacologic treatment -Current treatment  . Oscal (calcium carbonate) 600 mg BID . Vitamin D 400 IU daily -Recommend [phone] units of vitamin D daily. Recommend 1200 mg of calcium daily from dietary and supplemental sources. Recommend weight-bearing and muscle strengthening exercises for building and maintaining bone density. -Recommended to continue current medication  GERD (Goal: manage symptoms) -Controlled - pt uses OTC Nexium infrequently; declined offer to  get PPI through insurance -Current treatment  . Esomeprazole 20 mg PRN -Recommended to continue current medication  Osteoarthritis (Goal: manage pain) -Not ideally controlled - pt uses Tylenol or ibuprofen to manage joint pain in knees -Current treatment  . Tylenol 500 mg PRN . Ibuprofen 200 mg PRN -Counseled on maximum daily dose of Tylenol  3000 mg/day -Recommended to use Tylenol over ibuprofen or chronic arthritis  -Recommended to try Voltaren gel   Health Maintenance -Vaccine gaps: TDAP -Current therapy:  . Multivitamin . Vitamin E 400 IU daily . Fluticasone nasal spray PRN . Propylene glycol eye drops PRN -Patient is satisfied with current therapy and denies issues -Recommended to continue current medication  Patient Goals/Self-Care Activities . Over the next 365 days, patient will:  - take medications as prescribed focus on medication adherence by pill box check blood pressure weekly, document, and provide at future appointments  Follow Up Plan: Telephone follow up appointment with care management team member scheduled for: 1 year      Medication Assistance: None required.  Patient affirms current coverage meets needs.  Patient's preferred pharmacy is:  [facility] [address] [place] [address] [place] [address] Phone: [phone] Fax: [phone]  Uses pill box? Yes Pt endorses 100% compliance  We discussed: Current pharmacy is preferred with insurance plan and patient is satisfied with pharmacy services Patient decided to: Continue current medication management strategy  Care Plan and Follow Up Patient Decision:  Patient agrees to Care Plan and Follow-up.  Plan: Telephone follow up appointment with care management team member scheduled for:  1 year  Charlene Brooke, PharmD, [facility] Clinical Pharmacist [facility] [phone]

## 2020-07-12 NOTE — Patient Instructions (Addendum)
Visit Information  Phone number for Pharmacist: [phone]  Thank you for meeting with me to discuss your medications! I look forward to working with you to achieve your health care goals. Below is a summary of what we talked about during the visit:  Goals Addressed            This Visit's Progress   . Manage My Medicine       Timeframe:  Long-Range Goal Priority:  Medium Start Date:         07/11/20                    Expected End Date:     07/11/21                  Follow Up Date 02/03/21   - call for medicine refill 2 or 3 days before it runs out - call if I am sick and can't take my medicine - keep a list of all the medicines I take; vitamins and herbals too - use a pillbox to sort medicine  - Try Voltaren gel for joint pain   Why is this important?   . These steps will help you keep on track with your medicines.   Notes:       Patient Care Plan: CCM Pharmacy Care Plan    Problem Identified: Hypertension, Hyperlipidemia, GERD, Osteopenia and Osteoarthritis   Priority: High    Long-Range Goal: Disease management   Start Date: 07/12/2020  Expected End Date: 07/12/2021  This Visit's Progress: On track  Priority: High  Note:   Current Barriers:  . Unable to independently monitor therapeutic efficacy  Pharmacist Clinical Goal(s):  Marland Kitchen Over the next 365 days, patient will achieve adherence to monitoring guidelines and medication adherence to achieve therapeutic efficacy through collaboration with PharmD and provider.   Interventions: . 1:1 collaboration with Hoyt Koch, MD regarding development and update of comprehensive plan of care as evidenced by provider attestation and co-signature . Inter-disciplinary care team collaboration (see longitudinal plan of care) . Comprehensive medication review performed; medication list updated in electronic medical record  Hypertension (BP goal <130/80) -Controlled - BP at goal in recent office visits; denies side  effects -Current treatment: . Benazepril 40 mg - 1/2 tab daily . Bisoprolol-HCTZ 5-6.25 mg daily -Denies hypotensive/hypertensive symptoms -Educated on BP goals and benefits of medications for prevention of heart attack, stroke and kidney damage; Daily salt intake goal < 2300 mg; Exercise goal of 150 minutes per week; Importance of home blood pressure monitoring; -Counseled to monitor BP at home weekly, document, and provide log at future appointments -Recommended to continue current medication  Hyperlipidemia: (LDL goal < 100) -Controlled -Current treatment: . Pravastatin 20 mg daily . Omega-3 FA (Fish oil) 1000 mg BID . Aspirin 81 m daily -Educated on Cholesterol goals;  Benefits of statin for ASCVD risk reduction; -Recommended to continue current medication  Osteopenia (Goal: maintain/improve bone density, prevent fractures) -Controlled -Last DEXA Scan: 05/31/2020  10-year probability of major osteoporotic fracture: 12%  10-year probability of hip fracture: 3% -Patient is not a candidate for pharmacologic treatment -Current treatment  . Oscal (calcium carbonate) 600 mg BID . Vitamin D 400 IU daily -Recommend [phone] units of vitamin D daily. Recommend 1200 mg of calcium daily from dietary and supplemental sources. Recommend weight-bearing and muscle strengthening exercises for building and maintaining bone density. -Recommended to continue current medication  GERD (Goal: manage symptoms) -Controlled - pt uses OTC Nexium infrequently;  declined offer to get PPI through insurance -Current treatment  . Esomeprazole 20 mg PRN -Recommended to continue current medication  Osteoarthritis (Goal: manage pain) -Not ideally controlled - pt uses Tylenol or ibuprofen to manage joint pain in knees -Current treatment  . Tylenol 500 mg PRN . Ibuprofen 200 mg PRN -Counseled on maximum daily dose of Tylenol 3000 mg/day -Recommended to use Tylenol over ibuprofen or chronic arthritis   -Recommended to try Voltaren gel   Health Maintenance -Vaccine gaps: TDAP -Current therapy:  . Multivitamin . Vitamin E 400 IU daily . Fluticasone nasal spray PRN . Propylene glycol eye drops PRN -Patient is satisfied with current therapy and denies issues -Recommended to continue current medication  Patient Goals/Self-Care Activities . Over the next 365 days, patient will:  - take medications as prescribed focus on medication adherence by pill box check blood pressure weekly, document, and provide at future appointments  Follow Up Plan: Telephone follow up appointment with care management team member scheduled for: 1 year      Ms. Contino was given information about Chronic Care Management services today including:  1. CCM service includes personalized support from designated clinical staff supervised by her physician, including individualized plan of care and coordination with other care providers 2. 24/7 contact phone numbers for assistance for urgent and routine care needs. 3. Standard insurance, coinsurance, copays and deductibles apply for chronic care management only during months in which we provide at least 20 minutes of these services. Most insurances cover these services at 100%, however patients may be responsible for any copay, coinsurance and/or deductible if applicable. This service may help you avoid the need for more expensive face-to-face services. 4. Only one practitioner may furnish and bill the service in a calendar month. 5. The patient may stop CCM services at any time (effective at the end of the month) by phone call to the office staff.  Patient agreed to services and verbal consent obtained.   Patient verbalizes understanding of instructions provided today and agrees to view in [place].  Telephone follow up appointment with pharmacy team member scheduled for: 1 year  Charlene Brooke, PharmD, BCACP Clinical Pharmacist [facility] [place] protect organs, store calcium, anchor muscles, and support the whole body. Keeping your bones strong is important, especially as you get older. You can take actions to help keep your bones strong and healthy. Why is keeping my bones healthy important? Keeping your bones healthy is important because your body constantly replaces bone cells. Cells get old, and new cells take their place. As we age, we lose bone cells because the body may not be able to make enough new cells to replace the old cells. The amount of bone cells and bone tissue you have is referred to as bone mass. The higher your bone mass, the stronger your bones. The aging process leads to an overall loss of bone mass in the body, which can increase the likelihood of:  Joint pain and stiffness.  Broken bones.  A condition in which the bones become weak and brittle (osteoporosis). A large decline in bone mass occurs in older adults. In women, it occurs about the time of menopause.   What actions can I take to keep my bones healthy? Good health habits are important for maintaining healthy bones. This includes eating nutritious foods and exercising regularly. To have healthy bones, you need to get enough of the right minerals and vitamins. Most nutrition experts  recommend getting these nutrients from the foods that you eat. In some cases, taking supplements may also be recommended. Doing certain types of exercise is also important for bone health. What are the nutritional recommendations for healthy bones? Eating a well-balanced diet with plenty of calcium and vitamin D will help to protect your bones. Nutritional recommendations vary from person to person. Ask your health care provider what is healthy for you. Here are some general guidelines. Get enough calcium Calcium is the most important (essential) mineral for bone health. Most people can get enough calcium from their diet, but supplements  may be recommended for people who are at risk for osteoporosis. Good sources of calcium include:  Dairy products, such as low-fat or nonfat milk, cheese, and yogurt.  Dark green leafy vegetables, such as bok choy and broccoli.  Calcium-fortified foods, such as orange juice, cereal, bread, soy beverages, and tofu products.  Nuts, such as almonds. Follow these recommended amounts for daily calcium intake:  Children, age 90-3: 700 mg.  Children, age 76-8: 1,000 mg.  Children, age 22026-13: 1,300 mg.  Teens, age 54-18: 1,300 mg.  Adults, age 71-50: 1,000 mg.  Adults, age 22024-70: ? Men: 1,000 mg. ? Women: 1,200 mg.  Adults, age 34 or older: 1,200 mg.  Pregnant and breastfeeding females: ? Teens: 1,300 mg. ? Adults: 1,000 mg. Get enough vitamin D Vitamin D is the most essential vitamin for bone health. It helps the body absorb calcium. Sunlight stimulates the skin to make vitamin D, so be sure to get enough sunlight. If you live in a cold climate or you do not get outside often, your health care provider may recommend that you take vitamin D supplements. Good sources of vitamin D in your diet include:  Egg yolks.  Saltwater fish.  Milk and cereal fortified with vitamin D. Follow these recommended amounts for daily vitamin D intake:  Children and teens, age 90-18: 600 international units.  Adults, age 2 or younger: 400-800 international units.  Adults, age 58 or older: 800-1,000 international units. Get other important nutrients Other nutrients that are important for bone health include:  Phosphorus. This mineral is found in meat, poultry, dairy foods, nuts, and legumes. The recommended daily intake for adult men and adult women is 700 mg.  Magnesium. This mineral is found in seeds, nuts, dark green vegetables, and legumes. The recommended daily intake for adult men is 400-420 mg. For adult women, it is 310-320 mg.  Vitamin K. This vitamin is found in green leafy vegetables. The  recommended daily intake is 120 mg for adult men and 90 mg for adult women.   What type of physical activity is best for building and maintaining healthy bones? Weight-bearing and strength-building activities are important for building and maintaining healthy bones. Weight-bearing activities cause muscles and bones to work against gravity. Strength-building activities increase the strength of the muscles that support bones. Weight-bearing and muscle-building activities include:  Walking and hiking.  Jogging and running.  Dancing.  Gym exercises.  Lifting weights.  Tennis and racquetball.  Climbing stairs.  Aerobics. Adults should get at least 30 minutes of moderate physical activity on most days. Children should get at least 60 minutes of moderate physical activity on most days. Ask your health care provider what type of exercise is best for you.   How can I find out if my bone mass is low? Bone mass can be measured with an X-ray test called a bone mineral density (BMD) test. This test  is recommended for all women who are age 83 or older. It may also be recommended for:  Men who are age 47 or older.  People who are at risk for osteoporosis because of: ? Having bones that break easily. ? Having a long-term disease that weakens bones, such as kidney disease or rheumatoid arthritis. ? Having menopause earlier than normal. ? Taking medicine that weakens bones, such as steroids, thyroid hormones, or hormone treatment for breast cancer or prostate cancer. ? Smoking. ? Drinking three or more alcoholic drinks a day. If you find that you have a low bone mass, you may be able to prevent osteoporosis or further bone loss by changing your diet and lifestyle. Where can I find more information? For more information, check out the following websites:  [place]: AviationTales.fr  [employer] of Health: www.bones.SouthExposed.es  International Osteoporosis  Foundation: Administrator.iofbonehealth.org Summary  The aging process leads to an overall loss of bone mass in the body, which can increase the likelihood of broken bones and osteoporosis.  Eating a well-balanced diet with plenty of calcium and vitamin D will help to protect your bones.  Weight-bearing and strength-building activities are also important for building and maintaining strong bones.  Bone mass can be measured with an X-ray test called a bone mineral density (BMD) test. This information is not intended to replace advice given to you by your health care provider. Make sure you discuss any questions you have with your health care provider. Document Revised: 05/20/2017 Document Reviewed: 05/20/2017 Elsevier Patient Education  2021 [employer].

## 2020-07-19 DIAGNOSIS — Z961 Presence of intraocular lens: Secondary | ICD-10-CM | POA: Diagnosis not present

## 2020-07-21 ENCOUNTER — Other Ambulatory Visit: Payer: Self-pay

## 2020-07-21 ENCOUNTER — Ambulatory Visit (AMBULATORY_SURGERY_CENTER): Payer: Self-pay | Admitting: *Deleted

## 2020-07-21 VITALS — Ht 61.0 in | Wt 219.0 lb

## 2020-07-21 DIAGNOSIS — Z1211 Encounter for screening for malignant neoplasm of colon: Secondary | ICD-10-CM

## 2020-07-21 NOTE — Progress Notes (Signed)
No egg or soy allergy known to patient  No issues with past sedation with any surgeries or procedures Patient denies ever being told they had issues or difficulty with intubation  No FH of Malignant Hyperthermia No diet pills per patient No home 02 use per patient  No blood thinners per patient  Pt denies issues with constipation  No A fib or A flutter  EMMI video to pt or via MyChart  COVID 19 guidelines implemented in [place] today with Pt and RN  Pt is fully vaccinated  for Covid      NO PA's for preps discussed with pt In PV today  Discussed with pt there will be an out-of-pocket cost for prep and that varies from $0 to 70 dollars   Due to the COVID-19 pandemic we are asking patients to follow certain guidelines.  Pt aware of COVID protocols and [facility] guidelines   

## 2020-08-09 ENCOUNTER — Other Ambulatory Visit: Payer: Self-pay

## 2020-08-09 ENCOUNTER — Encounter: Payer: Self-pay | Admitting: Internal Medicine

## 2020-08-09 ENCOUNTER — Ambulatory Visit (AMBULATORY_SURGERY_CENTER): Payer: Medicare Other | Admitting: Internal Medicine

## 2020-08-09 VITALS — BP 152/73 | HR 61 | Temp 97.5°F | Resp 29 | Ht 61.0 in | Wt 219.0 lb

## 2020-08-09 DIAGNOSIS — Z1211 Encounter for screening for malignant neoplasm of colon: Secondary | ICD-10-CM | POA: Diagnosis not present

## 2020-08-09 MED ORDER — SODIUM CHLORIDE 0.9 % IV SOLN: 500.0000 mL | Freq: Once | INTRAVENOUS | Status: DC

## 2020-08-09 NOTE — Progress Notes (Signed)
Pt's states no medical or surgical changes since previsit or office visit.   CHECK-IN-JB  VITAL SIGNS-CW

## 2020-08-09 NOTE — Op Note (Signed)
[place] Patient Name: Kim Fuller Procedure Date: 08/09/2020 11:18 AM MRN: [id number] Endoscopist: Gatha Mayer , MD Age: 75 Referring MD:  Date of Birth: [birth year] Gender: Female Account #: [id number] Procedure:                Colonoscopy Indications:              Screening for colorectal malignant neoplasm, Last                            colonoscopy: 2012 Medicines:                Propofol per Anesthesia, Monitored Anesthesia Care Procedure:                Pre-Anesthesia Assessment:                           - Prior to the procedure, a History and Physical                            was performed, and patient medications and                            allergies were reviewed. The patient's tolerance of                            previous anesthesia was also reviewed. The risks                            and benefits of the procedure and the sedation                            options and risks were discussed with the patient.                            All questions were answered, and informed consent                            was obtained. Prior Anticoagulants: The patient has                            taken no previous anticoagulant or antiplatelet                            agents. ASA Grade Assessment: II - A patient with                            mild systemic disease. After reviewing the risks                            and benefits, the patient was deemed in                            satisfactory condition to undergo the procedure.  After obtaining informed consent, the colonoscope                            was passed under direct vision. Throughout the                            procedure, the patient's blood pressure, pulse, and                            oxygen saturations were monitored continuously. The                            Olympus PCF-H190DL (#[id number]) Colonoscope was                            introduced through the  anus and advanced to the the                            cecum, identified by appendiceal orifice and                            ileocecal valve. The colonoscopy was performed                            without difficulty. The patient tolerated the                            procedure well. The quality of the bowel                            preparation was good. The ileocecal valve,                            appendiceal orifice, and rectum were photographed. Scope In: 11:22:09 AM Scope Out: 11:35:41 AM Scope Withdrawal Time: 0 hours 9 minutes 0 seconds  Total Procedure Duration: 0 hours 13 minutes 32 seconds  Findings:                 The perianal and digital rectal examinations were                            normal.                           Multiple small and large-mouthed diverticula were                            found in the sigmoid colon. There was narrowing of                            the colon in association with the diverticular                            opening.  The exam was otherwise without abnormality on                            direct and retroflexion views. Complications:            No immediate complications. Estimated Blood Loss:     Estimated blood loss: none. Impression:               - Diverticulosis in the sigmoid colon. There was                            narrowing of the colon in association with the                            diverticular opening.                           - The examination was otherwise normal on direct                            and retroflexion views.                           - No specimens collected. Recommendation:           - Patient has a contact number available for                            emergencies. The signs and symptoms of potential                            delayed complications were discussed with the                            patient. Return to normal activities tomorrow.                             Written discharge instructions were provided to the                            patient.                           - Resume previous diet.                           - Continue present medications.                           - No repeat colonoscopy due to current age (91                            years or older) and the absence of colonic polyps. Gatha Mayer, MD 08/09/2020 11:42:42 AM This report has been signed electronically.

## 2020-08-09 NOTE — Progress Notes (Signed)
A/ox3, pleased with MAC, report to RN 

## 2020-08-09 NOTE — Patient Instructions (Addendum)
No polyps, no cancer seen.  You do have diverticulosis - thickened muscle rings and pouches in the colon wall. Please read the handout about this condition.   You do not need another routine colonoscopy, but if you have problems (hope not!) then we would.  I appreciate the opportunity to care for you. Gatha Mayer, MD, FACG   YOU HAD AN ENDOSCOPIC PROCEDURE TODAY AT [facility]:   Refer to the procedure report that was given to you for any specific questions about what was found during the examination.  If the procedure report does not answer your questions, please call your gastroenterologist to clarify.  If you requested that your care partner not be given the details of your procedure findings, then the procedure report has been included in a sealed envelope for you to review at your convenience later.  YOU SHOULD EXPECT: Some feelings of bloating in the abdomen. Passage of more gas than usual.  Walking can help get rid of the air that was put into your GI tract during the procedure and reduce the bloating. If you had a lower endoscopy (such as a colonoscopy or flexible sigmoidoscopy) you may notice spotting of blood in your stool or on the toilet paper. If you underwent a bowel prep for your procedure, you may not have a normal bowel movement for a few days.  Please Note:  You might notice some irritation and congestion in your nose or some drainage.  This is from the oxygen used during your procedure.  There is no need for concern and it should clear up in a day or so.  SYMPTOMS TO REPORT IMMEDIATELY:   Following lower endoscopy (colonoscopy or flexible sigmoidoscopy):  Excessive amounts of blood in the stool  Significant tenderness or worsening of abdominal pains  Swelling of the abdomen that is new, acute  Fever of 100F or higher   For urgent or emergent issues, a gastroenterologist can be reached at any hour by calling [phone]. Do not use MyChart  messaging for urgent concerns.    DIET:  We do recommend a small meal at first, but then you may proceed to your regular diet.  Drink plenty of fluids but you should avoid alcoholic beverages for 24 hours.  ACTIVITY:  You should plan to take it easy for the rest of today and you should NOT DRIVE or use heavy machinery until tomorrow (because of the sedation medicines used during the test).    FOLLOW UP: Our staff will call the number listed on your records 48-72 hours following your procedure to check on you and address any questions or concerns that you may have regarding the information given to you following your procedure. If we do not reach you, we will leave a message.  We will attempt to reach you two times.  During this call, we will ask if you have developed any symptoms of COVID 19. If you develop any symptoms (ie: fever, flu-like symptoms, shortness of breath, cough etc.) before then, please call [phone].  If you test positive for Covid 19 in the 2 weeks post procedure, please call and report this information to [place].     SIGNATURES/CONFIDENTIALITY: You and/or your care partner have signed paperwork which will be entered into your electronic medical record.  These signatures attest to the fact that that the information above on your After Visit Summary has been reviewed and is understood.  Full responsibility of the confidentiality of this discharge information lies  with you and/or your care-partner.

## 2020-08-11 ENCOUNTER — Telehealth: Payer: Self-pay | Admitting: *Deleted

## 2020-08-11 NOTE — Telephone Encounter (Signed)
  Follow up Call-  Call back number 08/09/2020  Post procedure Call Back phone  # [phone]  Permission to leave phone message Yes  Some recent data might be hidden     Patient questions:  Do you have a fever, pain , or abdominal swelling? No. Pain Score  0 *  Have you tolerated food without any problems? Yes.    Have you been able to return to your normal activities? Yes.    Do you have any questions about your discharge instructions: Diet   No. Medications  No. Follow up visit  No.  Do you have questions or concerns about your Care? No.  Actions: * If pain score is 4 or above: No action needed, pain <4.  1. Have you developed a fever since your procedure? no  2.   Have you had an respiratory symptoms (SOB or cough) since your procedure? no  3.   Have you tested positive for COVID 19 since your procedure no  4.   Have you had any family members/close contacts diagnosed with the COVID 19 since your procedure?  no   If yes to any of these questions please route to Joylene John, RN and Joella Prince, RN

## 2020-08-16 DIAGNOSIS — U071 COVID-19: Secondary | ICD-10-CM | POA: Diagnosis not present

## 2020-08-24 ENCOUNTER — Other Ambulatory Visit: Payer: Self-pay

## 2020-08-24 ENCOUNTER — Ambulatory Visit
Admission: RE | Admit: 2020-08-24 | Discharge: 2020-08-24 | Disposition: A | Discharge status: Home / Self Care | Payer: Medicare Other | Source: Ambulatory Visit | Attending: Hematology and Oncology | Admitting: Hematology and Oncology

## 2020-08-24 DIAGNOSIS — R922 Inconclusive mammogram: Secondary | ICD-10-CM | POA: Diagnosis not present

## 2020-08-24 DIAGNOSIS — Z171 Estrogen receptor negative status [ER-]: Secondary | ICD-10-CM

## 2020-08-25 NOTE — Progress Notes (Signed)
[place] [address] [place] Phone: [phone] Subjective:   I Kim Fuller am serving as a Education administrator for Dr. [name].  This visit occurred during the SARS-CoV-2 public health emergency.  Safety protocols were in place, including screening questions prior to the visit, additional usage of staff PPE, and extensive cleaning of exam room while observing appropriate contact time as indicated for disinfecting solutions.   I'm seeing this patient by the request  of:  Kim Koch, MD  CC: Right middle finger and right ring finger pain  [id number]  Kim Fuller is a 75 y.o. female coming in with complaint of right hand middle finger trigger finger. Last seen in 2020 for same issue. Patient states she has bilateral middle and ring finger pain. Palm of the hands are painful all the time. History of breast cancer.  Treated with radiation and chemotherapy over the last year.  Most recent mammogram was clear.  Onset- Chronic  Aggravating factors- closing the hands  Therapies tried- Voltaren gel, tylenol     Past Medical History:  Diagnosis Date  . Abnormal finding on Pap smear    x1  . Allergy   . Arthritis    severe in back and left hip  . Asthma   . Cancer [facility])    breast cancer (left), chemotherapy and radiation  . Cataract    had surgery  . Diverticulosis   . GERD (gastroesophageal reflux disease)   . Hyperlipidemia   . Hypertension   . Mitral valve prolapse   . Osteopenia    -1.5 @ femoral neck 11/2007  . Reactive airway disease    years ago ; severe asthma attack "bornchitis asthma " per patient, no issues since    Past Surgical History:  Procedure Laterality Date  . BREAST LUMPECTOMY WITH RADIOACTIVE SEED AND SENTINEL LYMPH NODE BIOPSY Left 09/16/2019   Procedure: LEFT BREAST LUMPECTOMY WITH RADIOACTIVE SEED AND SENTINEL LYMPH NODE BIOPSY;  Surgeon: Donnie Mesa, MD;  Location: [place];  Service: General;  Laterality: Left;  . CARDIOVASCULAR STRESS TEST  03/05/06  . CARPAL TUNNEL RELEASE  2009    bilaterally  . CATARACT EXTRACTION, BILATERAL     Dr Gershon Crane  . COLONOSCOPY W/ POLYPECTOMY  1998   Tics @ 2004 & 2012; Dr Carlean Purl  . HAND SURGERY     Trigger thumb  . PARTIAL KNEE ARTHROPLASTY Left 04/03/2017   Procedure: Left knee medial unicompartmental arthroplasty;  Surgeon: Gaynelle Arabian, MD;  [facility];  Service: Orthopedics;  Laterality: Left;  Adductor canal block  . PORTACATH PLACEMENT N/A 09/29/2019   Procedure: INSERTION PORT-A-CATH;  Surgeon: Donnie Mesa, MD;  Location: [place];  Service: General;  Laterality: N/A;  . TRIGGER FINGER RELEASE Right   . TUBAL LIGATION     Social History   Socioeconomic History  . Marital status: Married    Spouse name: Not on file  . Number of children: 2  . Years of education: Not on file  . Highest education level: Not on file  Occupational History  . Occupation: retired  Tobacco Use  . Smoking status: Former Smoker    Quit date: 05/07/1970    Years since quitting: 50.3  . Smokeless tobacco: Never Used  . Tobacco comment: Smoked as a teen  1962-1972,only up to 3 cigarettes/ day  Vaping Use  . Vaping Use: Never used  Substance and Sexual Activity  . Alcohol use: Yes  Comment: occas  . Drug use: No  . Sexual activity: Not Currently  Other Topics Concern  . Not on file  Social History Narrative  . Not on file   Social Determinants of Health   Financial Resource Strain: Low Risk   . Difficulty of Paying Living Expenses: Not hard at all  Food Insecurity: No Food Insecurity  . Worried About Charity fundraiser in the Last Year: Never true  . Ran Out of Food in the Last Year: Never true  Transportation Needs: No Transportation Needs  . Lack of Transportation (Medical): No  . Lack of Transportation (Non-Medical): No  Physical Activity: Sufficiently Active  . Days of Exercise per Week: 5 days  .  Minutes of Exercise per Session: 30 min  Stress: No Stress Concern Present  . Feeling of Stress : Not at all  Social Connections: Socially Integrated  . Frequency of Communication with Friends and Family: More than three times a week  . Frequency of Social Gatherings with Friends and Family: Once a week  . Attends Religious Services: More than 4 times per year  . Active Member of Clubs or Organizations: No  . Attends Archivist Meetings: More than 4 times per year  . Marital Status: Married   Allergies  Allergen Reactions  . Sulfonamide Derivatives Rash    Rash Because of a history of documented adverse serious drug reaction;Medi Alert bracelet  is recommended  . Tramadol Hcl Other (See Comments)    REACTION: cold sweats, weak, fatigue   Family History  Problem Relation Age of Onset  . Asthma Sister   . Hypertension Sister   . Hypertension Father   . Heart attack Father        MI > 18  . Prostate cancer Father   . Lung cancer Father   . Lung cancer Sister        smoker  . Melanoma Sister   . Osteoporosis Sister   . Glaucoma Sister        X 2  . Diverticulosis Sister        2 sisters  . Lung cancer Sister   . Colon polyps Sister        2 sisters with polyps  . Diverticulitis Sister        1 sister  . Hypertension Mother   . Hypertension Brother   . Skin cancer Sister   . Diabetes Neg Hx   . Stroke Neg Hx   . Breast cancer Neg Hx   . Colon cancer Neg Hx   . Esophageal cancer Neg Hx   . Stomach cancer Neg Hx      Current Outpatient Medications (Cardiovascular):  .  benazepril (LOTENSIN) 40 MG tablet, Take 0.5 tablets (20 mg total) by mouth daily. .  bisoprolol-hydrochlorothiazide (ZIAC) 5-6.25 MG tablet, Take 1 tablet by mouth once daily .  pravastatin (PRAVACHOL) 20 MG tablet, Take 1 tablet (20 mg total) by mouth daily.  Current Outpatient Medications (Respiratory):  .  cetirizine (ZYRTEC) 10 MG tablet, Take 10 mg by mouth as needed for allergies. .   fluticasone (FLONASE) 50 MCG/ACT nasal spray, USE TWO SPRAY(S) IN EACH NOSTRIL DAILY  Current Outpatient Medications (Analgesics):  .  acetaminophen (TYLENOL) 500 MG tablet, Take 500-1,000 mg by mouth every 6 (six) hours as needed (for pain.). Marland Kitchen  aspirin 81 MG chewable tablet, Chew 81 mg by mouth at bedtime.  Marland Kitchen  ibuprofen (ADVIL) 200 MG tablet, Take 400 mg by  mouth every 8 (eight) hours as needed (pain.).    Current Outpatient Medications (Other):  .  calcium carbonate (OSCAL) 1500 (600 Ca) MG TABS tablet, Take 600 mg of elemental calcium by mouth 2 (two) times daily with a meal. .  cholecalciferol (VITAMIN D3) 10 MCG (400 UNIT) TABS tablet, Take 400 Units by mouth at bedtime.  Marland Kitchen  esomeprazole (NEXIUM) 20 MG capsule, Take 20 mg by mouth daily as needed (acid reflux/indigestion.). Marland Kitchen  Multiple Vitamin (MULTIVITAMIN WITH MINERALS) TABS tablet, Take 1 tablet by mouth daily. Centrum Silver .  Omega-3 Fatty Acids (FISH OIL) 1000 MG CAPS, Take 1,000 mg by mouth 2 (two) times daily. Marland Kitchen  Propylene Glycol 0.6 % SOLN, Place 1 drop 3 (three) times daily as needed into both eyes (dry eyes). .  vitamin E 400 UNIT capsule, Take 400 Units by mouth daily.   Reviewed prior external information including notes and imaging from  primary care provider As well as notes that were available from care everywhere and other healthcare systems.  Past medical history, social, surgical and family history all reviewed in electronic medical record.  No pertanent information unless stated regarding to the chief complaint.   Review of Systems:  No headache, visual changes, nausea, vomiting, diarrhea, constipation, dizziness, abdominal pain, skin rash, fevers, chills, night sweats, weight loss, swollen lymph nodes,joint swelling, chest pain, shortness of breath, mood changes. POSITIVE muscle aches, Body aches Objective  Blood pressure 138/84, pulse 64, height 5\' 1"  (1.549 m), weight 219 lb (99.3 kg), SpO2 96 %.   General:  No apparent distress alert and oriented x3 mood and affect normal, dressed appropriately.  HEENT: Pupils equal, extraocular movements intact  Respiratory: Patient's speak in full sentences and does not appear short of breath  Cardiovascular: No lower extremity edema, non tender, no erythema  Gait mild antalgic gait MSK: Hand exam shows the patient does have significant tightness of the ring finger and the middle finger on the right hand.  Trigger nodule is noted at the A2 pulley.  Unable to actually fully flex.  Left hand also has some trigger nodules also of the ring and middle finger but no true triggering occurring at this time but there is tenderness to palpation.  Procedure: Real-time Ultrasound Guided Injection of middle flexor tendon sheath of the right hand Device: GE Logiq Q7 Ultrasound guided injection is preferred based studies that show increased duration, increased effect, greater accuracy, decreased procedural pain, increased response rate, and decreased cost with ultrasound guided versus blind injection.  Verbal informed consent obtained.  Time-out conducted.  Noted no overlying erythema, induration, or other signs of local infection.  Skin prepped in a sterile fashion.  Local anesthesia: Topical Ethyl chloride.  With sterile technique and under real time ultrasound guidance: With a 25-gauge half inch needle injected with 0.5 cc of 0.5% Marcaine and 0.5 cc of Kenalog 20 mg Completed without difficulty  Pain immediately improved suggesting accurate placement of the medication.  Advised to call if fevers/chills, erythema, induration, drainage, or persistent bleeding.  Impression: Technically successful ultrasound guided injection.  Procedure: Real-time Ultrasound Guided Injection of right ring finger flexor tendon sheath Device: GE Logiq Q7 Ultrasound guided injection is preferred based studies that show increased duration, increased effect, greater accuracy, decreased procedural  pain, increased response rate, and decreased cost with ultrasound guided versus blind injection.  Verbal informed consent obtained.  Time-out conducted.  Noted no overlying erythema, induration, or other signs of local infection.  Skin prepped in a  sterile fashion.  Local anesthesia: Topical Ethyl chloride.  With sterile technique and under real time ultrasound guidance: With a 25-gauge half inch needle injected with 0.5 cc of 0.5% Marcaine and 0.5 cc of Kenalog 40 mg/mL into the tendon sheath Completed without difficulty  Pain immediately resolved suggesting accurate placement of the medication.  Advised to call if fevers/chills, erythema, induration, drainage, or persistent bleeding.  Impression: Technically successful ultrasound guided injection.    Impression and Recommendations:     The above documentation has been reviewed and is accurate and complete Lyndal Pulley, DO

## 2020-08-26 ENCOUNTER — Encounter: Payer: Self-pay | Admitting: Family Medicine

## 2020-08-26 ENCOUNTER — Ambulatory Visit: Payer: Self-pay

## 2020-08-26 ENCOUNTER — Other Ambulatory Visit: Payer: Self-pay

## 2020-08-26 ENCOUNTER — Ambulatory Visit (INDEPENDENT_AMBULATORY_CARE_PROVIDER_SITE_OTHER): Payer: Medicare Other | Admitting: Family Medicine

## 2020-08-26 VITALS — BP 138/84 | HR 64 | Ht 61.0 in | Wt 219.0 lb

## 2020-08-26 DIAGNOSIS — M79641 Pain in right hand: Secondary | ICD-10-CM | POA: Diagnosis not present

## 2020-08-26 DIAGNOSIS — M79642 Pain in left hand: Secondary | ICD-10-CM

## 2020-08-26 DIAGNOSIS — M65341 Trigger finger, right ring finger: Secondary | ICD-10-CM | POA: Diagnosis not present

## 2020-08-26 DIAGNOSIS — M65331 Trigger finger, right middle finger: Secondary | ICD-10-CM | POA: Diagnosis not present

## 2020-08-26 NOTE — Assessment & Plan Note (Signed)
Injection given today.  Tolerated the procedure well.  Discussed heat and massage as well as bracing at night.  Follow-up again in 4 to 6 weeks.  Patient has very similar presentation on the contralateral side we will consider treatment

## 2020-08-26 NOTE — Assessment & Plan Note (Signed)
Patient given injection today and tolerated the procedure well, we discussed with patient about icing regimen.  Patient is starting to have more on the contralateral side as well.  Patient did go through treatment including radiation and chemotherapy for her breast cancer last year.  Patient will continue with the heat and massage, discussed topical anti-inflammatories, discussed bracing at night.  Follow-up again in 4 to 6 weeks and can consider the possibility of doing the contralateral side as well.

## 2020-08-26 NOTE — Patient Instructions (Addendum)
Good to see you Arnica lotion Heat and massage Injections today See me again in 4-6 week

## 2020-09-01 ENCOUNTER — Other Ambulatory Visit: Payer: Self-pay

## 2020-09-02 ENCOUNTER — Ambulatory Visit (INDEPENDENT_AMBULATORY_CARE_PROVIDER_SITE_OTHER): Payer: Medicare Other | Admitting: Internal Medicine

## 2020-09-02 ENCOUNTER — Encounter: Payer: Self-pay | Admitting: Internal Medicine

## 2020-09-02 DIAGNOSIS — K5792 Diverticulitis of intestine, part unspecified, without perforation or abscess without bleeding: Secondary | ICD-10-CM | POA: Diagnosis not present

## 2020-09-02 MED ORDER — DICYCLOMINE HCL 10 MG PO CAPS: 10.0000 mg | ORAL_CAPSULE | Freq: Three times a day (TID) | ORAL | 0 refills | Status: DC | PRN

## 2020-09-02 MED ORDER — AMOXICILLIN-POT CLAVULANATE 875-125 MG PO TABS: 1.0000 | ORAL_TABLET | Freq: Two times a day (BID) | ORAL | 0 refills | Status: AC

## 2020-09-02 NOTE — Patient Instructions (Addendum)
We have sent in augmentin to take 1 pill twice a day for a week.   We have sent in bentyl to use for pain up to 3 times a day.

## 2020-09-02 NOTE — Assessment & Plan Note (Signed)
Rx augmentin and bentyl. Last colonoscopy 08/2020 with diverticulosis and no polyps repeat not recommended due to age.

## 2020-09-02 NOTE — Progress Notes (Signed)
   Subjective:   Patient ID: Kim Fuller, female    DOB: 05-Jun-1945, 75 y.o.   MRN: [id number]  HPI The patient is a 75 YO female coming in for diverticulitis. She has had this in the past and it feels same. She is having pain low stomach bilaterally. Started Monday and was severe. Has gradually improved and pain is not tolerable but still 7/10. Denies vomiting. Some reduced appetite. Denies constipation or diarrhea. Denies blood in stool. Feels like eating some homemade strawberry ice cream caused this flare.   Review of Systems  Constitutional: Positive for appetite change. Negative for activity change, fatigue and unexpected weight change.  HENT: Negative.   Eyes: Negative.   Respiratory: Negative for cough, chest tightness and shortness of breath.   Cardiovascular: Negative for chest pain, palpitations and leg swelling.  Gastrointestinal: Positive for abdominal distention, abdominal pain and nausea. Negative for anal bleeding, blood in stool, constipation, diarrhea and vomiting.  Musculoskeletal: Negative.   Skin: Negative.   Neurological: Negative.   Psychiatric/Behavioral: Negative.     Objective:  Physical Exam Constitutional:      Appearance: She is well-developed.  HENT:     Head: Normocephalic and atraumatic.  Cardiovascular:     Rate and Rhythm: Normal rate and regular rhythm.  Pulmonary:     Effort: Pulmonary effort is normal. No respiratory distress.     Breath sounds: Normal breath sounds. No wheezing or rales.  Abdominal:     General: Bowel sounds are normal. There is no distension.     Palpations: Abdomen is soft.     Tenderness: There is abdominal tenderness. There is no rebound.     Comments: Pain lower abdomen bilaterally, no rebound or guarding  Musculoskeletal:     Cervical back: Normal range of motion.  Skin:    General: Skin is warm and dry.  Neurological:     Mental Status: She is alert and oriented to person, place, and time.     Coordination:  Coordination normal.     Vitals:   09/02/20 1035  BP: 128/78  Pulse: (!) 51  Resp: 18  Temp: 98.4 F (36.9 C)  TempSrc: Oral  SpO2: 98%  Weight: 214 lb 3.2 oz (97.2 kg)  Height: 5\' 1"  (1.549 m)    This visit occurred during the SARS-CoV-2 public health emergency.  Safety protocols were in place, including screening questions prior to the visit, additional usage of staff PPE, and extensive cleaning of exam room while observing appropriate contact time as indicated for disinfecting solutions.   Assessment & Plan:

## 2020-09-13 ENCOUNTER — Telehealth: Payer: Self-pay | Admitting: Pharmacist

## 2020-09-13 NOTE — Progress Notes (Signed)
    Chronic Care Management Pharmacy Assistant   Name: Kim Fuller  MRN: [id number] DOB: December 18, 1945    Reason for Encounter: Disease State General Call    Recent office visits:  09/02/20 Dr. Sharlet Salina, prescribed augmentin, and bentyl  Recent consult visits:  08/26/20 [facility] visits:  None in previous 6 months  Medications: Outpatient Encounter Medications as of 09/13/2020  Medication Sig  . acetaminophen (TYLENOL) 500 MG tablet Take 500-1,000 mg by mouth every 6 (six) hours as needed (for pain.).  Marland Kitchen aspirin 81 MG chewable tablet Chew 81 mg by mouth at bedtime.   . benazepril (LOTENSIN) 40 MG tablet Take 0.5 tablets (20 mg total) by mouth daily.  . bisoprolol-hydrochlorothiazide (ZIAC) 5-6.25 MG tablet Take 1 tablet by mouth once daily  . calcium carbonate (OSCAL) 1500 (600 Ca) MG TABS tablet Take 600 mg of elemental calcium by mouth 2 (two) times daily with a meal.  . cetirizine (ZYRTEC) 10 MG tablet Take 10 mg by mouth as needed for allergies.  . cholecalciferol (VITAMIN D3) 10 MCG (400 UNIT) TABS tablet Take 400 Units by mouth at bedtime.   . dicyclomine (BENTYL) 10 MG capsule Take 1 capsule (10 mg total) by mouth 3 (three) times daily as needed for spasms.  Marland Kitchen esomeprazole (NEXIUM) 20 MG capsule Take 20 mg by mouth daily as needed (acid reflux/indigestion.).  Marland Kitchen fluticasone (FLONASE) 50 MCG/ACT nasal spray USE TWO SPRAY(S) IN EACH NOSTRIL DAILY  . ibuprofen (ADVIL) 200 MG tablet Take 400 mg by mouth every 8 (eight) hours as needed (pain.).   Marland Kitchen Multiple Vitamin (MULTIVITAMIN WITH MINERALS) TABS tablet Take 1 tablet by mouth daily. Centrum Silver  . Omega-3 Fatty Acids (FISH OIL) 1000 MG CAPS Take 1,000 mg by mouth 2 (two) times daily.  . pravastatin (PRAVACHOL) 20 MG tablet Take 1 tablet (20 mg total) by mouth daily.  Marland Kitchen Propylene Glycol 0.6 % SOLN Place 1 drop 3 (three) times daily as needed into both eyes (dry eyes).  . vitamin E 400 UNIT capsule  Take 400 Units by mouth daily.   No facility-administered encounter medications on file as of 09/13/2020.   Have you had any problems recently with your health? Patient stated that a few weeks ago she has diverticulosis and Dr. Sharlet Salina prescribed a antibiotic and is doing find today    Have you had any problems with your pharmacy? Patient states that she does not have nay problems with getting medication or the cost of medication from the pharmacy  What issues or side effects are you having with your medications? Patient states that she does not have nay side effects from medications  What would you like me to pass along to [facility] for them to help you with?  Patient states that she does not have any other concerns about her health or medications at this time  What can we do to take care of you better? Patient states if any changes to her health she will call Dr. Sharlet Salina office  Star Rating Drugs: Benazepril 08/18/20 90 ds Pravastatin 08/04/20 90 ds  [place] Pharmacist Assistant [phone]  Time spent:22

## 2020-09-26 ENCOUNTER — Ambulatory Visit: Payer: Medicare Other | Attending: Surgery

## 2020-09-26 ENCOUNTER — Other Ambulatory Visit: Payer: Self-pay

## 2020-09-26 DIAGNOSIS — Z483 Aftercare following surgery for neoplasm: Secondary | ICD-10-CM | POA: Insufficient documentation

## 2020-09-26 NOTE — Therapy (Signed)
[place], [place], [address] Phone: [phone]   Fax:  [phone]  Physical Therapy Treatment  Patient Details  Name: Kim Fuller MRN: [id number] Date of Birth: 15-Jun-1945 Referring Provider (PT): Dr. Donnie Mesa   Encounter Date: 09/26/2020   PT End of Session - 09/26/20 1419    Visit Number 2   # unchanged due to screen only   Number of Visits 2    PT Start Time 1416    PT Stop Time 1422    PT Time Calculation (min) 6 min    Activity Tolerance Patient tolerated treatment well    Behavior During Therapy [facility] for tasks assessed/performed           Past Medical History:  Diagnosis Date  . Abnormal finding on Pap smear    x1  . Allergy   . Arthritis    severe in back and left hip  . Asthma   . Cancer [facility])    breast cancer (left), chemotherapy and radiation  . Cataract    had surgery  . Diverticulosis   . GERD (gastroesophageal reflux disease)   . Hyperlipidemia   . Hypertension   . Mitral valve prolapse   . Osteopenia    -1.5 @ femoral neck 11/2007  . Reactive airway disease    years ago ; severe asthma attack "bornchitis asthma " per patient, no issues since     Past Surgical History:  Procedure Laterality Date  . BREAST LUMPECTOMY WITH RADIOACTIVE SEED AND SENTINEL LYMPH NODE BIOPSY Left 09/16/2019   Procedure: LEFT BREAST LUMPECTOMY WITH RADIOACTIVE SEED AND SENTINEL LYMPH NODE BIOPSY;  Surgeon: Donnie Mesa, MD;  Location: [place];  Service: General;  Laterality: Left;  . CARDIOVASCULAR STRESS TEST  03/05/06  . CARPAL TUNNEL RELEASE  2009    bilaterally  . CATARACT EXTRACTION, BILATERAL     Dr Gershon Crane  . COLONOSCOPY W/ POLYPECTOMY  1998   Tics @ 2004 & 2012; Dr Carlean Purl  . HAND SURGERY     Trigger thumb  . PARTIAL KNEE ARTHROPLASTY Left 04/03/2017   Procedure: Left knee medial unicompartmental arthroplasty;  Surgeon: Gaynelle Arabian, MD;   [facility];  Service: Orthopedics;  Laterality: Left;  Adductor canal block  . PORTACATH PLACEMENT N/A 09/29/2019   Procedure: INSERTION PORT-A-CATH;  Surgeon: Donnie Mesa, MD;  Location: [place];  Service: General;  Laterality: N/A;  . TRIGGER FINGER RELEASE Right   . TUBAL LIGATION      There were no vitals filed for this visit.   Subjective Assessment - 09/26/20 1417    Subjective Pt returns for 3 month L-Dex screen.    Pertinent History Patient was diagnosed on 08/19/2019 with left triple negative invasive ductal carcinoma breast cancer. It is grade III with a Ki67 of 50%. Patient reports she underwent a left lumpectomy and sentinel node biopsy (4 negative nodes) on 09/16/2019. She had a partial left knee replacement in 2018 and some chronic low back pain.                  L-DEX FLOWSHEETS - 09/26/20 1400      L-DEX LYMPHEDEMA SCREENING   Measurement Type Unilateral    L-DEX MEASUREMENT EXTREMITY Upper Extremity    POSITION  Standing    DOMINANT SIDE Right    At Risk Side Left    BASELINE SCORE (UNILATERAL) 6    L-DEX SCORE (UNILATERAL) 8.9    VALUE CHANGE (  UNILAT) 2.9                                  PT Long Term Goals - 11/02/19 1128      PT LONG TERM GOAL #1   Title Patient will demonstrate she has regained full shoulder ROM and function post operatively compared to baselines.    Time 8    Period Weeks    Status Partially Met                 Plan - 09/26/20 1423    Clinical Impression Statement Pt returns for her 3 month L-Dex screen. Her change from baseline of 2.9 is WNLs so no further treatment is required at this time except to cont every 3 month L-Dex screens which pt is agreeable to.    PT Next Visit Plan Cont every 3 month L-Dex screens for 2 years (until ~09/15/2021), then every 6 months.    Consulted and Agree with Plan of Care Patient           Patient will benefit from skilled therapeutic intervention in order  to improve the following deficits and impairments:     Visit Diagnosis: Aftercare following surgery for neoplasm     Problem List Patient Active Problem List   Diagnosis Date Noted  . Acute diverticulitis 09/02/2020  . Trigger finger, right ring finger 08/26/2020  . Port-A-Cath in place 10/27/2019  . Malignant neoplasm of upper-outer quadrant of left breast in female, estrogen receptor negative ([place]) 09/08/2019  . Acute left-sided low back pain without sciatica 07/06/2019  . Morbid obesity ([place]) 04/24/2019  . Trigger finger, right middle finger 07/02/2017  . Chronic shoulder bursitis, left 11/19/2016  . OA (osteoarthritis) of knee 11/18/2015  . Routine general medical examination at a health care facility 02/20/2015  . GERD 12/31/2007  . Osteopenia 10/31/2007  . HYPERLIPIDEMIA 03/20/2007  . Essential hypertension 03/20/2007    Otelia Limes, PTA 09/26/2020, 2:25 PM  [place], [place], [address] Phone: [phone]   Fax:  [phone]  Name: Tristan Bramble MRN: [id number] Date of Birth: October 18, 1945

## 2020-10-04 NOTE — Progress Notes (Signed)
[facility] [address] [place] Phone: [phone] Subjective:   I Kim Fuller am serving as a Education administrator for Dr. [name].  This visit occurred during the SARS-CoV-2 public health emergency.  Safety protocols were in place, including screening questions prior to the visit, additional usage of staff PPE, and extensive cleaning of exam room while observing appropriate contact time as indicated for disinfecting solutions.   I'm seeing this patient by the request  of:  Hoyt Koch, MD  CC: Left hand trigger fingers  LKT:[id number]   08/26/2020 Injection given today.  Tolerated the procedure well.  Discussed heat and massage as well as bracing at night.  Follow-up again in 4 to 6 weeks.  Patient has very similar presentation on the contralateral side we will consider treatment  Patient given injection today and tolerated the procedure well, we discussed with patient about icing regimen.  Patient is starting to have more on the contralateral side as well.  Patient did go through treatment including radiation and chemotherapy for her breast cancer last year.  Patient will continue with the heat and massage, discussed topical anti-inflammatories, discussed bracing at night.  Follow-up again in 4 to 6 weeks and can consider the possibility of doing the contralateral side as well.  Update 08/07/8766 Kim Fuller is a 75 y.o. female coming in with complaint of left hand, middle and ring finger trigger finger. Patient states the right hand is better. Left middle and ring finger trigger.  Patient states that is very similar to the contralateral side.  Has responded extremely well to the injections on the contralateral side.  Would like to consider this for this side.  Starting to affect daily activities.  Has been trying the brace at night with no significant improvement      Past Medical History:  Diagnosis Date  . Abnormal finding on  Pap smear    x1  . Allergy   . Arthritis    severe in back and left hip  . Asthma   . Cancer [facility])    breast cancer (left), chemotherapy and radiation  . Cataract    had surgery  . Diverticulosis   . GERD (gastroesophageal reflux disease)   . Hyperlipidemia   . Hypertension   . Mitral valve prolapse   . Osteopenia    -1.5 @ femoral neck 11/2007  . Reactive airway disease    years ago ; severe asthma attack "bornchitis asthma " per patient, no issues since    Past Surgical History:  Procedure Laterality Date  . BREAST LUMPECTOMY WITH RADIOACTIVE SEED AND SENTINEL LYMPH NODE BIOPSY Left 09/16/2019   Procedure: LEFT BREAST LUMPECTOMY WITH RADIOACTIVE SEED AND SENTINEL LYMPH NODE BIOPSY;  Surgeon: Donnie Mesa, MD;  Location: [place];  Service: General;  Laterality: Left;  . CARDIOVASCULAR STRESS TEST  03/05/06  . CARPAL TUNNEL RELEASE  2009    bilaterally  . CATARACT EXTRACTION, BILATERAL     Dr Gershon Crane  . COLONOSCOPY W/ POLYPECTOMY  1998   Tics @ 2004 & 2012; Dr Carlean Purl  . HAND SURGERY     Trigger thumb  . PARTIAL KNEE ARTHROPLASTY Left 04/03/2017   Procedure: Left knee medial unicompartmental arthroplasty;  Surgeon: Gaynelle Arabian, MD;  Location: [facility];  Service: Orthopedics;  Laterality: Left;  Adductor canal block  . PORTACATH PLACEMENT N/A 09/29/2019   Procedure: INSERTION PORT-A-CATH;  Surgeon: Donnie Mesa, MD;  Location: [place];  Service: General;  Laterality: N/A;  . TRIGGER FINGER RELEASE Right   . TUBAL LIGATION     Social History   Socioeconomic History  . Marital status: Married    Spouse name: Not on file  . Number of children: 2  . Years of education: Not on file  . Highest education level: Not on file  Occupational History  . Occupation: retired  Tobacco Use  . Smoking status: Former Smoker    Quit date: 05/07/1970    Years since quitting: 50.4  . Smokeless tobacco: Never Used  . Tobacco comment: Smoked as a teen  1962-1972,only up  to 3 cigarettes/ day  Vaping Use  . Vaping Use: Never used  Substance and Sexual Activity  . Alcohol use: Yes    Comment: occas  . Drug use: No  . Sexual activity: Not Currently  Other Topics Concern  . Not on file  Social History Narrative  . Not on file   Social Determinants of Health   Financial Resource Strain: Low Risk   . Difficulty of Paying Living Expenses: Not hard at all  Food Insecurity: No Food Insecurity  . Worried About Charity fundraiser in the Last Year: Never true  . Ran Out of Food in the Last Year: Never true  Transportation Needs: No Transportation Needs  . Lack of Transportation (Medical): No  . Lack of Transportation (Non-Medical): No  Physical Activity: Sufficiently Active  . Days of Exercise per Week: 5 days  . Minutes of Exercise per Session: 30 min  Stress: No Stress Concern Present  . Feeling of Stress : Not at all  Social Connections: Socially Integrated  . Frequency of Communication with Friends and Family: More than three times a week  . Frequency of Social Gatherings with Friends and Family: Once a week  . Attends Religious Services: More than 4 times per year  . Active Member of Clubs or Organizations: No  . Attends Archivist Meetings: More than 4 times per year  . Marital Status: Married   Allergies  Allergen Reactions  . Sulfonamide Derivatives Rash    Rash Because of a history of documented adverse serious drug reaction;Medi Alert bracelet  is recommended  . Tramadol Hcl Other (See Comments)    REACTION: cold sweats, weak, fatigue   Family History  Problem Relation Age of Onset  . Asthma Sister   . Hypertension Sister   . Hypertension Father   . Heart attack Father        MI > 21  . Prostate cancer Father   . Lung cancer Father   . Lung cancer Sister        smoker  . Melanoma Sister   . Osteoporosis Sister   . Glaucoma Sister        X 2  . Diverticulosis Sister        2 sisters  . Lung cancer Sister   . Colon  polyps Sister        2 sisters with polyps  . Diverticulitis Sister        1 sister  . Hypertension Mother   . Hypertension Brother   . Skin cancer Sister   . Diabetes Neg Hx   . Stroke Neg Hx   . Breast cancer Neg Hx   . Colon cancer Neg Hx   . Esophageal cancer Neg Hx   . Stomach cancer Neg Hx      Current Outpatient Medications (Cardiovascular):  .  benazepril (LOTENSIN) 40  MG tablet, Take 0.5 tablets (20 mg total) by mouth daily. .  bisoprolol-hydrochlorothiazide (ZIAC) 5-6.25 MG tablet, Take 1 tablet by mouth once daily .  pravastatin (PRAVACHOL) 20 MG tablet, Take 1 tablet (20 mg total) by mouth daily.  Current Outpatient Medications (Respiratory):  .  cetirizine (ZYRTEC) 10 MG tablet, Take 10 mg by mouth as needed for allergies. .  fluticasone (FLONASE) 50 MCG/ACT nasal spray, USE TWO SPRAY(S) IN EACH NOSTRIL DAILY  Current Outpatient Medications (Analgesics):  .  acetaminophen (TYLENOL) 500 MG tablet, Take 500-1,000 mg by mouth every 6 (six) hours as needed (for pain.). Marland Kitchen  aspirin 81 MG chewable tablet, Chew 81 mg by mouth at bedtime.  Marland Kitchen  ibuprofen (ADVIL) 200 MG tablet, Take 400 mg by mouth every 8 (eight) hours as needed (pain.).    Current Outpatient Medications (Other):  .  calcium carbonate (OSCAL) 1500 (600 Ca) MG TABS tablet, Take 600 mg of elemental calcium by mouth 2 (two) times daily with a meal. .  cholecalciferol (VITAMIN D3) 10 MCG (400 UNIT) TABS tablet, Take 400 Units by mouth at bedtime.  .  dicyclomine (BENTYL) 10 MG capsule, Take 1 capsule (10 mg total) by mouth 3 (three) times daily as needed for spasms. Marland Kitchen  esomeprazole (NEXIUM) 20 MG capsule, Take 20 mg by mouth daily as needed (acid reflux/indigestion.). Marland Kitchen  Multiple Vitamin (MULTIVITAMIN WITH MINERALS) TABS tablet, Take 1 tablet by mouth daily. Centrum Silver .  Omega-3 Fatty Acids (FISH OIL) 1000 MG CAPS, Take 1,000 mg by mouth 2 (two) times daily. Marland Kitchen  Propylene Glycol 0.6 % SOLN, Place 1 drop 3  (three) times daily as needed into both eyes (dry eyes). .  vitamin E 400 UNIT capsule, Take 400 Units by mouth daily.   Reviewed prior external information including notes and imaging from  primary care provider As well as notes that were available from care everywhere and other healthcare systems.  Past medical history, social, surgical and family history all reviewed in electronic medical record.  No pertanent information unless stated regarding to the chief complaint.   Review of Systems:  No headache, visual changes, nausea, vomiting, diarrhea, constipation, dizziness, abdominal pain, skin rash, fevers, chills, night sweats, weight loss, swollen lymph nodes, body aches, joint swelling, chest pain, shortness of breath, mood changes. POSITIVE muscle aches  Objective  Blood pressure (!) 148/84, pulse 61, height 5\' 1"  (1.549 m), weight 217 lb (98.4 kg), SpO2 97 %.   General: No apparent distress alert and oriented x3 mood and affect normal, dressed appropriately.  HEENT: Pupils equal, extraocular movements intact  Respiratory: Patient's speak in full sentences and does not appear short of breath  Cardiovascular: No lower extremity edema, non tender, no erythema  Hand exam shows the patient does have trigger nodules noted at the A2 pulley of the ring and middle finger on the left hand.  Tender to palpation.  Mild triggering noted of no finger.  Neurovascular intact distally.  Procedure: Real-time Ultrasound Guided Injection of left third flexor tendon sheath Device: GE Logiq Q7 Ultrasound guided injection is preferred based studies that show increased duration, increased effect, greater accuracy, decreased procedural pain, increased response rate, and decreased cost with ultrasound guided versus blind injection.  Verbal informed consent obtained.  Time-out conducted.  Noted no overlying erythema, induration, or other signs of local infection.  Skin prepped in a sterile fashion.  Local  anesthesia: Topical Ethyl chloride.  With sterile technique and under real time ultrasound guidance: With a  25-gauge half inch needle injected with 0.5 cc of 0.5% Marcaine and 0.5 cc of Kenalog 40 mg/mL Completed without difficulty  Pain immediately resolved suggesting accurate placement of the medication.  Advised to call if fevers/chills, erythema, induration, drainage, or persistent bleeding.  Impression: Technically successful ultrasound guided injection.  Procedure: Real-time Ultrasound Guided Injection of left fourth flexor tendon sheath Device: GE Logiq Q7 Ultrasound guided injection is preferred based studies that show increased duration, increased effect, greater accuracy, decreased procedural pain, increased response rate, and decreased cost with ultrasound guided versus blind injection.  Verbal informed consent obtained.  Time-out conducted.  Noted no overlying erythema, induration, or other signs of local infection.  Skin prepped in a sterile fashion.  Local anesthesia: Topical Ethyl chloride.  With sterile technique and under real time ultrasound guidance: With a 25-gauge half inch needle injected with 0.5 cc of 0.5% Marcaine and 0.5 cc of Kenalog 40 mg/mL Completed without difficulty  Pain immediately resolved suggesting accurate placement of the medication.  Advised to call if fevers/chills, erythema, induration, drainage, or persistent bleeding.  Impression: Technically successful ultrasound guided injection.    Impression and Recommendations:     The above documentation has been reviewed and is accurate and complete Lyndal Pulley, DO

## 2020-10-05 ENCOUNTER — Encounter: Payer: Self-pay | Admitting: Family Medicine

## 2020-10-05 ENCOUNTER — Ambulatory Visit: Payer: Self-pay

## 2020-10-05 ENCOUNTER — Other Ambulatory Visit: Payer: Self-pay

## 2020-10-05 ENCOUNTER — Ambulatory Visit (INDEPENDENT_AMBULATORY_CARE_PROVIDER_SITE_OTHER): Payer: Medicare Other | Admitting: Family Medicine

## 2020-10-05 VITALS — BP 148/84 | HR 61 | Ht 61.0 in | Wt 217.0 lb

## 2020-10-05 DIAGNOSIS — M65332 Trigger finger, left middle finger: Secondary | ICD-10-CM | POA: Diagnosis not present

## 2020-10-05 DIAGNOSIS — M65342 Trigger finger, left ring finger: Secondary | ICD-10-CM

## 2020-10-05 DIAGNOSIS — M79642 Pain in left hand: Secondary | ICD-10-CM | POA: Diagnosis not present

## 2020-10-05 DIAGNOSIS — M79641 Pain in right hand: Secondary | ICD-10-CM | POA: Diagnosis not present

## 2020-10-05 NOTE — Patient Instructions (Addendum)
Good to see you Exercise 3 times a week Injection today See me again in 6 weeks

## 2020-10-05 NOTE — Assessment & Plan Note (Signed)
Patient given injection and tolerated the procedure well.  Discussed icing regimen and home exercises.  We discussed avoiding certain activities.  Patient knows that she will with her having injections on the contralateral side.  Did discuss with patient again if any type of redness, streaking, or inflammation to seek medical attention immediately.  Patient is in agreement with the plan and will follow up again in 6 weeks

## 2020-10-05 NOTE — Assessment & Plan Note (Signed)
Patient given injection and tolerated the procedure well as well.  We discussed which activities to do which wants to avoid.  Increase activity slowly.  Patient will do bracing.  Follow-up again in 6 weeks

## 2020-10-20 ENCOUNTER — Inpatient Hospital Stay: Payer: Medicare Other | Attending: Hematology and Oncology

## 2020-10-20 ENCOUNTER — Ambulatory Visit (HOSPITAL_COMMUNITY)
Admission: RE | Admit: 2020-10-20 | Discharge: 2020-10-20 | Disposition: A | Discharge status: Home / Self Care | Payer: Medicare Other | Source: Ambulatory Visit | Attending: Hematology and Oncology | Admitting: Hematology and Oncology

## 2020-10-20 ENCOUNTER — Other Ambulatory Visit: Payer: Self-pay

## 2020-10-20 DIAGNOSIS — Z882 Allergy status to sulfonamides status: Secondary | ICD-10-CM | POA: Insufficient documentation

## 2020-10-20 DIAGNOSIS — Z171 Estrogen receptor negative status [ER-]: Secondary | ICD-10-CM | POA: Diagnosis not present

## 2020-10-20 DIAGNOSIS — C50912 Malignant neoplasm of unspecified site of left female breast: Secondary | ICD-10-CM | POA: Diagnosis not present

## 2020-10-20 DIAGNOSIS — E041 Nontoxic single thyroid nodule: Secondary | ICD-10-CM | POA: Insufficient documentation

## 2020-10-20 DIAGNOSIS — N6489 Other specified disorders of breast: Secondary | ICD-10-CM | POA: Insufficient documentation

## 2020-10-20 DIAGNOSIS — Z923 Personal history of irradiation: Secondary | ICD-10-CM | POA: Insufficient documentation

## 2020-10-20 DIAGNOSIS — K5750 Diverticulosis of both small and large intestine without perforation or abscess without bleeding: Secondary | ICD-10-CM | POA: Diagnosis not present

## 2020-10-20 DIAGNOSIS — N200 Calculus of kidney: Secondary | ICD-10-CM | POA: Diagnosis not present

## 2020-10-20 DIAGNOSIS — C50412 Malignant neoplasm of upper-outer quadrant of left female breast: Secondary | ICD-10-CM | POA: Diagnosis not present

## 2020-10-20 DIAGNOSIS — M19012 Primary osteoarthritis, left shoulder: Secondary | ICD-10-CM | POA: Diagnosis not present

## 2020-10-20 DIAGNOSIS — J9811 Atelectasis: Secondary | ICD-10-CM | POA: Diagnosis not present

## 2020-10-20 DIAGNOSIS — M19011 Primary osteoarthritis, right shoulder: Secondary | ICD-10-CM | POA: Diagnosis not present

## 2020-10-20 DIAGNOSIS — Z79899 Other long term (current) drug therapy: Secondary | ICD-10-CM | POA: Insufficient documentation

## 2020-10-20 DIAGNOSIS — K828 Other specified diseases of gallbladder: Secondary | ICD-10-CM | POA: Diagnosis not present

## 2020-10-20 DIAGNOSIS — N281 Cyst of kidney, acquired: Secondary | ICD-10-CM | POA: Diagnosis not present

## 2020-10-20 LAB — CBC WITH DIFFERENTIAL (CANCER CENTER ONLY)
Abs Immature Granulocytes: 0.01 10*3/uL (ref 0.00–0.07)
Basophils Absolute: 0.1 10*3/uL (ref 0.0–0.1)
Basophils Relative: 1 %
Eosinophils Absolute: 0.1 10*3/uL (ref 0.0–0.5)
Eosinophils Relative: 2 %
HCT: 42 % (ref 36.0–46.0)
Hemoglobin: 13.7 g/dL (ref 12.0–15.0)
Immature Granulocytes: 0 %
Lymphocytes Relative: 26 %
Lymphs Abs: 1.5 10*3/uL (ref 0.7–4.0)
MCH: 32.6 pg (ref 26.0–34.0)
MCHC: 32.6 g/dL (ref 30.0–36.0)
MCV: 100 fL (ref 80.0–100.0)
Monocytes Absolute: 0.6 10*3/uL (ref 0.1–1.0)
Monocytes Relative: 11 %
Neutro Abs: 3.5 10*3/uL (ref 1.7–7.7)
Neutrophils Relative %: 60 %
Platelet Count: 164 10*3/uL (ref 150–400)
RBC: 4.2 MIL/uL (ref 3.87–5.11)
RDW: 12.3 % (ref 11.5–15.5)
WBC Count: 5.7 10*3/uL (ref 4.0–10.5)
nRBC: 0 % (ref 0.0–0.2)

## 2020-10-20 LAB — CMP (CANCER CENTER ONLY)
ALT: 22 U/L (ref 0–44)
AST: 22 U/L (ref 15–41)
Albumin: 4 g/dL (ref 3.5–5.0)
Alkaline Phosphatase: 50 U/L (ref 38–126)
Anion gap: 7 (ref 5–15)
BUN: 18 mg/dL (ref 8–23)
CO2: 30 mmol/L (ref 22–32)
Calcium: 9.4 mg/dL (ref 8.9–10.3)
Chloride: 102 mmol/L (ref 98–111)
Creatinine: 0.88 mg/dL (ref 0.44–1.00)
GFR, Estimated: >60 mL/min (ref >60)
Glucose, Bld: 92 mg/dL (ref 70–99)
Potassium: 4.1 mmol/L (ref 3.5–5.1)
Sodium: 139 mmol/L (ref 135–145)
Total Bilirubin: 0.8 mg/dL (ref 0.3–1.2)
Total Protein: 7.2 g/dL (ref 6.5–8.1)

## 2020-10-20 MED ORDER — SODIUM CHLORIDE (PF) 0.9 % IJ SOLN
INTRAMUSCULAR | Status: AC
  Filled 2020-10-20: qty 50

## 2020-10-20 MED ORDER — IOHEXOL 300 MG/ML  SOLN
100.0000 mL | Freq: Once | INTRAMUSCULAR | Status: AC | PRN
  Administered 2020-10-20: 100 mL via INTRAVENOUS

## 2020-10-20 NOTE — Progress Notes (Signed)
Patient Care Team: Hoyt Koch, MD as PCP - General (Internal Medicine) Mauro Kaufmann, RN as Oncology Nurse Navigator Rockwell Germany, RN as Oncology Nurse Navigator Donnie Mesa, MD as Consulting Physician (General Surgery) Nicholas Lose, MD as Consulting Physician (Hematology and Oncology) Kyung Rudd, MD as Consulting Physician (Radiation Oncology) Rutherford Guys, MD as Consulting Physician (Ophthalmology) Charlton Haws, [facility] as Pharmacist (Pharmacist)  DIAGNOSIS:    ICD-10-CM   1. Malignant neoplasm of upper-outer quadrant of left breast in female, estrogen receptor negative ([place])  C50.412    Z17.1       SUMMARY OF ONCOLOGIC HISTORY: Oncology History  Malignant neoplasm of upper-outer quadrant of left breast in female, estrogen receptor negative ([place])  09/08/2019 Initial Diagnosis   Screening mammogram detected a left breast asymmetry. Diagnostic mammogram and US showed a 0.5cm mass, 2 o'clock position, with no axillary adenopathy. Biopsy showed IDC, grade 3, HER-2 - (0), ER/PR -, Ki67 50%.   09/09/2019 Cancer Staging   Staging form: Breast, AJCC 8th Edition - Clinical stage from 09/09/2019: Stage IB (cT1a, cN0, cM0, G3, ER-, PR-, HER2-) - Signed by Nicholas Lose, MD on 09/09/2019    09/16/2019 Surgery   Left lumpectomy ([facility]): IDC, grade 3, 0.9cm, with high grade DCIS, 4 left axillary lymph nodes negative.   09/16/2019 Cancer Staging   Staging form: Breast, AJCC 8th Edition - Pathologic stage from 09/16/2019: Stage IB (pT1b, pN0, cM0, G3, ER-, PR-, HER2-) - Signed by Gardenia Phlegm, NP on 10/07/2019    10/15/2019 - 01/29/2020 Adjuvant Chemotherapy   CMF x 6   02/26/2020 - 03/23/2020 Radiation Therapy   Adjuvant left breast radiation     CHIEF COMPLIANT: Follow-up of left breast cancer  INTERVAL HISTORY: Kim Fuller is a 75 y.o. with above-mentioned history of  triple negative left breast cancer who underwent a left lumpectomy, and completed  adjuvant chemotherapy with CMF and adjuvant radiation therapy. Mammogram on 08/24/20 showed no evidence of malignancy bilaterally. CT CAP on 10/20/20 showed no evidence of metastatic disease within chest, abdomen, or pelvis. She presents to the clinic today for follow-up.   ALLERGIES:  is allergic to sulfonamide derivatives and tramadol hcl.  MEDICATIONS:  Current Outpatient Medications  Medication Sig Dispense Refill   acetaminophen (TYLENOL) 500 MG tablet Take 500-1,000 mg by mouth every 6 (six) hours as needed (for pain.).     aspirin 81 MG chewable tablet Chew 81 mg by mouth at bedtime.      benazepril (LOTENSIN) 40 MG tablet Take 0.5 tablets (20 mg total) by mouth daily. 45 tablet 3   bisoprolol-hydrochlorothiazide (ZIAC) 5-6.25 MG tablet Take 1 tablet by mouth once daily 90 tablet 3   calcium carbonate (OSCAL) 1500 (600 Ca) MG TABS tablet Take 600 mg of elemental calcium by mouth 2 (two) times daily with a meal.     cetirizine (ZYRTEC) 10 MG tablet Take 10 mg by mouth as needed for allergies.     cholecalciferol (VITAMIN D3) 10 MCG (400 UNIT) TABS tablet Take 400 Units by mouth at bedtime.      dicyclomine (BENTYL) 10 MG capsule Take 1 capsule (10 mg total) by mouth 3 (three) times daily as needed for spasms. 30 capsule 0   esomeprazole (NEXIUM) 20 MG capsule Take 20 mg by mouth daily as needed (acid reflux/indigestion.).     fluticasone (FLONASE) 50 MCG/ACT nasal spray USE TWO SPRAY(S) IN EACH NOSTRIL DAILY 48 g 6   ibuprofen (ADVIL) 200 MG tablet  Take 400 mg by mouth every 8 (eight) hours as needed (pain.).      Multiple Vitamin (MULTIVITAMIN WITH MINERALS) TABS tablet Take 1 tablet by mouth daily. Centrum Silver     Omega-3 Fatty Acids (FISH OIL) 1000 MG CAPS Take 1,000 mg by mouth 2 (two) times daily.     pravastatin (PRAVACHOL) 20 MG tablet Take 1 tablet (20 mg total) by mouth daily. 90 tablet 3   Propylene Glycol 0.6 % SOLN Place 1 drop 3 (three) times daily as needed into both eyes  (dry eyes).     vitamin E 400 UNIT capsule Take 400 Units by mouth daily.     No current facility-administered medications for this visit.    PHYSICAL EXAMINATION: ECOG PERFORMANCE STATUS: 1 - Symptomatic but completely ambulatory  Vitals:   10/21/20 0940  BP: (!) 115/54  Pulse: 63  Resp: 17  Temp: 97.7 F (36.5 C)  SpO2: 96%   Filed Weights   10/21/20 0940  Weight: 217 lb 9.6 oz (98.7 kg)    BREAST: No palpable masses or nodules in either right or left breasts. No palpable axillary supraclavicular or infraclavicular adenopathy no breast tenderness or nipple discharge. (exam performed in the presence of a chaperone)  LABORATORY DATA:  I have reviewed the data as listed CMP Latest Ref Rng & Units 10/20/2020 01/29/2020 01/08/2020  Glucose 70 - 99 mg/dL 92 95 86  BUN 8 - 23 mg/dL $Remove'18 12 11  'oWrqTiN$ Creatinine 0.44 - 1.00 mg/dL 0.88 0.72 0.67  Sodium 135 - 145 mmol/L 139 139 141  Potassium 3.5 - 5.1 mmol/L 4.1 3.7 3.7  Chloride 98 - 111 mmol/L 102 105 104  CO2 22 - 32 mmol/L $RemoveB'30 29 29  'FqneoiSc$ Calcium 8.9 - 10.3 mg/dL 9.4 9.1 9.5  Total Protein 6.5 - 8.1 g/dL 7.2 7.2 7.0  Total Bilirubin 0.3 - 1.2 mg/dL 0.8 0.5 0.5  Alkaline Phos 38 - 126 U/L 50 63 61  AST 15 - 41 U/L $Remo'22 23 20  'uQxZx$ ALT 0 - 44 U/L $Remo'22 20 15    'mRyAr$ Lab Results  Component Value Date   WBC 5.7 10/20/2020   HGB 13.7 10/20/2020   HCT 42.0 10/20/2020   MCV 100.0 10/20/2020   PLT 164 10/20/2020   NEUTROABS 3.5 10/20/2020    ASSESSMENT & PLAN:  Malignant neoplasm of upper-outer quadrant of left breast in female, estrogen receptor negative ([place]) 09/16/2018: Left lumpectomy ([facility]): IDC, grade 3, 0.9cm, with high grade DCIS, 4 left axillary lymph nodes negative.  Triple negative with a Ki-67 of 50%   Treatment plan: 1.  Adjuvant chemotherapy with CMF x6 completed 01/29/2020 2.  Adjuvant radiation therapy  02/26/2020-03/23/2020 ------------------------------------------------------------------------------------------------------------------------------------------------------------ Current Treatment: Surveillance Mammograms 4/202022: Benign breast density category B Breast exam 10/21/2020: Benign  CT chest abdomen pelvis in 10/20/2020: No evidence of Met disease, hepatic and renal cysts, bilateral kidney stones, diverticulosis Thyroid nodule noted on CT scan: We will obtain a thyroid ultrasound for further evaluation.  Return to clinic in 1 year for follow-up    No orders of the defined types were placed in this encounter.  The patient has a good understanding of the overall plan. she agrees with it. she will call with any problems that may develop before the next visit here.  Total time spent: 20 mins including face to face time and time spent for planning, charting and coordination of care  Rulon Eisenmenger, MD, MPH 10/21/2020  I, Thana Ates, am acting as scribe for Dr. Loleta Dicker  Amisadai Woodford.  I have reviewed the above documentation for accuracy and completeness, and I agree with the above.

## 2020-10-20 NOTE — Assessment & Plan Note (Signed)
09/16/2018:Left lumpectomy ([facility]): IDC, grade 3, 0.9cm, with high grade DCIS, 4 left axillary lymph nodes negative.Triple negative with a Ki-67 of 50%  Treatment plan: 1.Adjuvant chemotherapy with CMF x6 completed 01/29/2020 2.Adjuvant radiation therapy 02/26/2020-03/23/2020 ------------------------------------------------------------------------------------------------------------------------------------------------------------ Current Treatment: Surveillance Mammograms to be done April 2022 CT chest abdomen pelvis in 10/20/2020: No evidence of Met disease  I recommended she continue with healthy diet and exercise.

## 2020-10-21 ENCOUNTER — Inpatient Hospital Stay (HOSPITAL_BASED_OUTPATIENT_CLINIC_OR_DEPARTMENT_OTHER): Payer: Medicare Other | Admitting: Hematology and Oncology

## 2020-10-21 ENCOUNTER — Telehealth: Payer: Self-pay | Admitting: Hematology and Oncology

## 2020-10-21 VITALS — BP 115/54 | HR 63 | Temp 97.7°F | Resp 17 | Ht 61.0 in | Wt 217.6 lb

## 2020-10-21 DIAGNOSIS — E041 Nontoxic single thyroid nodule: Secondary | ICD-10-CM | POA: Diagnosis not present

## 2020-10-21 DIAGNOSIS — Z171 Estrogen receptor negative status [ER-]: Secondary | ICD-10-CM | POA: Diagnosis not present

## 2020-10-21 DIAGNOSIS — C50412 Malignant neoplasm of upper-outer quadrant of left female breast: Secondary | ICD-10-CM

## 2020-10-21 DIAGNOSIS — Z923 Personal history of irradiation: Secondary | ICD-10-CM | POA: Diagnosis not present

## 2020-10-21 DIAGNOSIS — N6489 Other specified disorders of breast: Secondary | ICD-10-CM | POA: Diagnosis not present

## 2020-10-21 DIAGNOSIS — Z79899 Other long term (current) drug therapy: Secondary | ICD-10-CM | POA: Diagnosis not present

## 2020-10-21 DIAGNOSIS — Z882 Allergy status to sulfonamides status: Secondary | ICD-10-CM | POA: Diagnosis not present

## 2020-10-21 NOTE — Telephone Encounter (Signed)
Scheduled per los. Gave avs and calendar  

## 2020-11-04 ENCOUNTER — Ambulatory Visit (HOSPITAL_COMMUNITY)
Admission: RE | Admit: 2020-11-04 | Discharge: 2020-11-04 | Disposition: A | Discharge status: Home / Self Care | Payer: Medicare Other | Source: Ambulatory Visit | Attending: Hematology and Oncology | Admitting: Hematology and Oncology

## 2020-11-04 ENCOUNTER — Telehealth: Payer: Self-pay | Admitting: Hematology and Oncology

## 2020-11-04 ENCOUNTER — Other Ambulatory Visit: Payer: Self-pay

## 2020-11-04 DIAGNOSIS — E041 Nontoxic single thyroid nodule: Secondary | ICD-10-CM | POA: Diagnosis not present

## 2020-11-04 DIAGNOSIS — E042 Nontoxic multinodular goiter: Secondary | ICD-10-CM | POA: Diagnosis not present

## 2020-11-04 NOTE — Telephone Encounter (Signed)
I left a voicemail for her that the thyroid ultrasound was benign.  Radiology does not recommend thyroid biopsies.

## 2020-11-12 ENCOUNTER — Encounter (HOSPITAL_COMMUNITY): Payer: Self-pay

## 2020-11-21 NOTE — Progress Notes (Signed)
[facility] [place] Phone: [phone] Subjective:   [name], am serving as a scribe for Dr. [name]. This visit occurred during the SARS-CoV-2 public health emergency.  Safety protocols were in place, including screening questions prior to the visit, additional usage of staff PPE, and extensive cleaning of exam room while observing appropriate contact time as indicated for disinfecting solutions.   I'm seeing this patient by the request  of:  Hoyt Koch, MD  CC: Accompanied DEXA scan  [id number]  10/05/2020 Patient given injection and tolerated the procedure well as well.  We discussed which activities to do which wants to avoid.  Increase activity slowly.  Patient will do bracing.  Follow-up again in 6 weeks        Patient given injection and tolerated the procedure well.  Discussed icing regimen and home exercises.  We discussed avoiding certain activities.  Patient knows that she will with her having injections on the contralateral side.  Did discuss with patient again if any type of redness, streaking, or inflammation to seek medical attention immediately.  Patient is in agreement with the plan and will follow up again in 6 weeks      Update 0/76/2263 Analiya Porco is a 75 y.o. female coming in with complaint of L middle and L ring finger.   Back pain has been doing better after performing HEP.  Patient states on some mild pain at night.  Nothing that is severe.  Nothing that is stopping her from activity.  Denies any fevers chills or any abnormal weight loss.      Past Medical History:  Diagnosis Date   Abnormal finding on Pap smear    x1   Allergy    Arthritis    severe in back and left hip   Asthma    Cancer ([place])    breast cancer (left), chemotherapy and radiation   Cataract    had surgery   Diverticulosis    GERD (gastroesophageal reflux disease)    Hyperlipidemia     Hypertension    Mitral valve prolapse    Osteopenia    -1.5 @ femoral neck 11/2007   Reactive airway disease    years ago ; severe asthma attack "bornchitis asthma " per patient, no issues since    Past Surgical History:  Procedure Laterality Date   BREAST LUMPECTOMY WITH RADIOACTIVE SEED AND SENTINEL LYMPH NODE BIOPSY Left 09/16/2019   Procedure: LEFT BREAST LUMPECTOMY WITH RADIOACTIVE SEED AND SENTINEL LYMPH NODE BIOPSY;  Surgeon: Donnie Mesa, MD;  Location: [facility];  Service: General;  Laterality: Left;   CARDIOVASCULAR STRESS TEST  03/05/06   CARPAL TUNNEL RELEASE  2009    bilaterally   CATARACT EXTRACTION, BILATERAL     Dr Gershon Crane   COLONOSCOPY W/ POLYPECTOMY  1998   Tics @ 2004 & 2012; Dr Carlean Purl   HAND SURGERY     Trigger thumb   PARTIAL KNEE ARTHROPLASTY Left 04/03/2017   Procedure: Left knee medial unicompartmental arthroplasty;  Surgeon: Gaynelle Arabian, MD;  Location: [facility];  Service: Orthopedics;  Laterality: Left;  Adductor canal block   PORTACATH PLACEMENT N/A 09/29/2019   Procedure: INSERTION PORT-A-CATH;  Surgeon: Donnie Mesa, MD;  Location: [facility];  Service: General;  Laterality: N/A;   TRIGGER FINGER RELEASE Right    TUBAL LIGATION     Social History   Socioeconomic History   Marital status: Married  Spouse name: Not on file   Number of children: 2   Years of education: Not on file   Highest education level: Not on file  Occupational History   Occupation: retired  Tobacco Use   Smoking status: Former    Types: Cigarettes    Quit date: 05/07/1970    Years since quitting: 50.5   Smokeless tobacco: Never   Tobacco comments:    Smoked as a teen  1962-1972,only up to 3 cigarettes/ day  Vaping Use   Vaping Use: Never used  Substance and Sexual Activity   Alcohol use: Yes    Comment: occas   Drug use: No   Sexual activity: Not Currently  Other Topics Concern   Not on file  Social History Narrative   Not on file   Social  Determinants of Health   Financial Resource Strain: Low Risk    Difficulty of Paying Living Expenses: Not hard at all  Food Insecurity: No Food Insecurity   Worried About Charity fundraiser in the Last Year: Never true   [place] in the Last Year: Never true  Transportation Needs: No Transportation Needs   Lack of Transportation (Medical): No   Lack of Transportation (Non-Medical): No  Physical Activity: Sufficiently Active   Days of Exercise per Week: 5 days   Minutes of Exercise per Session: 30 min  Stress: No Stress Concern Present   Feeling of Stress : Not at all  Social Connections: Socially Integrated   Frequency of Communication with Friends and Family: More than three times a week   Frequency of Social Gatherings with Friends and Family: Once a week   Attends Religious Services: More than 4 times per year   Active Member of [employer] or Organizations: No   Attends Music therapist: More than 4 times per year   Marital Status: Married   Allergies  Allergen Reactions   Sulfonamide Derivatives Rash    Rash Because of a history of documented adverse serious drug reaction;Medi Alert bracelet  is recommended   Tramadol Hcl Other (See Comments)    REACTION: cold sweats, weak, fatigue   Family History  Problem Relation Age of Onset   Asthma Sister    Hypertension Sister    Hypertension Father    Heart attack Father        MI > 64   Prostate cancer Father    Lung cancer Father    Lung cancer Sister        smoker   Melanoma Sister    Osteoporosis Sister    Glaucoma Sister        X 2   Diverticulosis Sister        2 sisters   Lung cancer Sister    Colon polyps Sister        2 sisters with polyps   Diverticulitis Sister        1 sister   Hypertension Mother    Hypertension Brother    Skin cancer Sister    Diabetes Neg Hx    Stroke Neg Hx    Breast cancer Neg Hx    Colon cancer Neg Hx    Esophageal cancer Neg Hx    Stomach cancer Neg Hx       Current Outpatient Medications (Cardiovascular):    benazepril (LOTENSIN) 40 MG tablet, Take 0.5 tablets (20 mg total) by mouth daily.   bisoprolol-hydrochlorothiazide (ZIAC) 5-6.25 MG tablet, Take 1 tablet by mouth once daily  pravastatin (PRAVACHOL) 20 MG tablet, Take 1 tablet (20 mg total) by mouth daily.  Current Outpatient Medications (Respiratory):    cetirizine (ZYRTEC) 10 MG tablet, Take 10 mg by mouth as needed for allergies.   fluticasone (FLONASE) 50 MCG/ACT nasal spray, USE TWO SPRAY(S) IN EACH NOSTRIL DAILY  Current Outpatient Medications (Analgesics):    acetaminophen (TYLENOL) 500 MG tablet, Take 500-1,000 mg by mouth every 6 (six) hours as needed (for pain.).   aspirin 81 MG chewable tablet, Chew 81 mg by mouth at bedtime.    ibuprofen (ADVIL) 200 MG tablet, Take 400 mg by mouth every 8 (eight) hours as needed (pain.).    Current Outpatient Medications (Other):    calcium carbonate (OSCAL) 1500 (600 Ca) MG TABS tablet, Take 600 mg of elemental calcium by mouth 2 (two) times daily with a meal.   cholecalciferol (VITAMIN D3) 10 MCG (400 UNIT) TABS tablet, Take 400 Units by mouth at bedtime.    dicyclomine (BENTYL) 10 MG capsule, Take 1 capsule (10 mg total) by mouth 3 (three) times daily as needed for spasms.   esomeprazole (NEXIUM) 20 MG capsule, Take 20 mg by mouth daily as needed (acid reflux/indigestion.).   Multiple Vitamin (MULTIVITAMIN WITH MINERALS) TABS tablet, Take 1 tablet by mouth daily. Centrum Silver   Omega-3 Fatty Acids (FISH OIL) 1000 MG CAPS, Take 1,000 mg by mouth 2 (two) times daily.   Propylene Glycol 0.6 % SOLN, Place 1 drop 3 (three) times daily as needed into both eyes (dry eyes).   vitamin E 400 UNIT capsule, Take 400 Units by mouth daily.   Reviewed prior external information including notes and imaging from  primary care provider As well as notes that were available from care everywhere and other healthcare systems.  Patient recently did  have a thyroid nodule benign.  Patient also has CT chest abdomen and pelvis that is unremarkable for any type of recurrence of cancer.  Past medical history, social, surgical and family history all reviewed in electronic medical record.  No pertanent information unless stated regarding to the chief complaint.   Review of Systems:  No headache, visual changes, nausea, vomiting, diarrhea, constipation, dizziness, abdominal pain, skin rash, fevers, chills, night sweats, weight loss, swollen lymph nodes, body aches, joint swelling, chest pain, shortness of breath, mood changes. POSITIVE muscle aches  Objective  Blood pressure (!) 142/82, pulse (!) 58, height 5\' 1"  (1.549 m), weight 220 lb (99.8 kg), SpO2 94 %.   General: No apparent distress alert and oriented x3 mood and affect normal, dressed appropriately.  HEENT: Pupils equal, extraocular movements intact  Respiratory: Patient's speak in full sentences and does not appear short of breath  Cardiovascular: No lower extremity edema, non tender, no erythema  Gait normal with good balance and coordination.  MSK: Mild arthritic changes.  Patient's hands have full range of motion.  No triggering noted.  Good grip strength noted.    Impression and Recommendations:    The above documentation has been reviewed and is accurate and complete Lyndal Pulley, DO

## 2020-11-23 ENCOUNTER — Other Ambulatory Visit: Payer: Self-pay

## 2020-11-23 ENCOUNTER — Ambulatory Visit (INDEPENDENT_AMBULATORY_CARE_PROVIDER_SITE_OTHER): Payer: Medicare Other | Admitting: Family Medicine

## 2020-11-23 ENCOUNTER — Encounter: Payer: Self-pay | Admitting: Family Medicine

## 2020-11-23 ENCOUNTER — Ambulatory Visit: Payer: Self-pay

## 2020-11-23 VITALS — BP 142/82 | HR 58 | Ht 61.0 in | Wt 220.0 lb

## 2020-11-23 DIAGNOSIS — M65341 Trigger finger, right ring finger: Secondary | ICD-10-CM | POA: Diagnosis not present

## 2020-11-23 DIAGNOSIS — M65342 Trigger finger, left ring finger: Secondary | ICD-10-CM | POA: Diagnosis not present

## 2020-11-23 DIAGNOSIS — M79641 Pain in right hand: Secondary | ICD-10-CM | POA: Diagnosis not present

## 2020-11-23 DIAGNOSIS — M65331 Trigger finger, right middle finger: Secondary | ICD-10-CM

## 2020-11-23 DIAGNOSIS — M65332 Trigger finger, left middle finger: Secondary | ICD-10-CM

## 2020-11-23 DIAGNOSIS — M79642 Pain in left hand: Secondary | ICD-10-CM

## 2020-11-23 NOTE — Patient Instructions (Addendum)
Good to see you Go mattress shopping See me when you need me

## 2020-11-23 NOTE — Assessment & Plan Note (Signed)
Significant improvement.  Patient is having no triggering of any of her fingers at this time.  Can follow-up with me as needed

## 2020-11-25 ENCOUNTER — Telehealth: Payer: Self-pay | Admitting: Pharmacist

## 2020-11-25 NOTE — Chronic Care Management (AMB) (Signed)
Chronic Care Management Pharmacy Assistant   Name: Kim Fuller  MRN: [id number] DOB: 1946-03-29   Reason for Encounter: Disease State - Hypertension    Recent office visits:  None noted.   Recent consult visits:  11/23/20 [name], MD  - Sports Medicine - Bilateral Hand Pain -  no medication changes, Follow up as needed.   10/21/20 Nicholas Lose, MD - Oncology - US Thyroid ordered, no medication changes, follow up in 1 year.   10/05/20 [name], MD  - Sports Medicine - Bilateral Hand Pain - Trigger finger injection performed, no medication changes. Follow up in 6 weeks.  09/26/20 - Collie Siad, PTA - Rehabilitation Medicine - Aftercare following surgery for neoplasm - no medication changes. No follow up noted.  Hospital visits:  None in previous 6 months  Medications: Outpatient Encounter Medications as of 11/25/2020  Medication Sig   acetaminophen (TYLENOL) 500 MG tablet Take 500-1,000 mg by mouth every 6 (six) hours as needed (for pain.).   aspirin 81 MG chewable tablet Chew 81 mg by mouth at bedtime.    benazepril (LOTENSIN) 40 MG tablet Take 0.5 tablets (20 mg total) by mouth daily.   bisoprolol-hydrochlorothiazide (ZIAC) 5-6.25 MG tablet Take 1 tablet by mouth once daily   calcium carbonate (OSCAL) 1500 (600 Ca) MG TABS tablet Take 600 mg of elemental calcium by mouth 2 (two) times daily with a meal.   cetirizine (ZYRTEC) 10 MG tablet Take 10 mg by mouth as needed for allergies.   cholecalciferol (VITAMIN D3) 10 MCG (400 UNIT) TABS tablet Take 400 Units by mouth at bedtime.    dicyclomine (BENTYL) 10 MG capsule Take 1 capsule (10 mg total) by mouth 3 (three) times daily as needed for spasms.   esomeprazole (NEXIUM) 20 MG capsule Take 20 mg by mouth daily as needed (acid reflux/indigestion.).   fluticasone (FLONASE) 50 MCG/ACT nasal spray USE TWO SPRAY(S) IN EACH NOSTRIL DAILY   ibuprofen (ADVIL) 200 MG tablet Take 400 mg by mouth every 8 (eight) hours  as needed (pain.).    Multiple Vitamin (MULTIVITAMIN WITH MINERALS) TABS tablet Take 1 tablet by mouth daily. Centrum Silver   Omega-3 Fatty Acids (FISH OIL) 1000 MG CAPS Take 1,000 mg by mouth 2 (two) times daily.   pravastatin (PRAVACHOL) 20 MG tablet Take 1 tablet (20 mg total) by mouth daily.   Propylene Glycol 0.6 % SOLN Place 1 drop 3 (three) times daily as needed into both eyes (dry eyes).   vitamin E 400 UNIT capsule Take 400 Units by mouth daily.   No facility-administered encounter medications on file as of 11/25/2020.    Reviewed chart prior to disease state call. Spoke with patient regarding BP  Recent Office Vitals: BP Readings from Last 3 Encounters:  11/23/20 (!) 142/82  10/21/20 (!) 115/54  10/05/20 (!) 148/84   Pulse Readings from Last 3 Encounters:  11/23/20 (!) 58  10/21/20 63  10/05/20 61    Wt Readings from Last 3 Encounters:  11/23/20 220 lb (99.8 kg)  10/21/20 217 lb 9.6 oz (98.7 kg)  10/05/20 217 lb (98.4 kg)     Kidney Function Lab Results  Component Value Date/Time   CREATININE 0.88 10/20/2020 09:16 AM   CREATININE 0.72 01/29/2020 11:46 AM   GFR 72.18 04/24/2019 09:32 AM   GFRNONAA >60 10/20/2020 09:16 AM   GFRAA >60 01/29/2020 11:46 AM    BMP Latest Ref Rng & Units 10/20/2020 01/29/2020 01/08/2020  Glucose 70 - 99  mg/dL 92 95 86  BUN 8 - 23 mg/dL '18 12 11  '$ Creatinine 0.44 - 1.00 mg/dL 0.88 0.72 0.67  Sodium 135 - 145 mmol/L 139 139 141  Potassium 3.5 - 5.1 mmol/L 4.1 3.7 3.7  Chloride 98 - 111 mmol/L 102 105 104  CO2 22 - 32 mmol/L '30 29 29  '$ Calcium 8.9 - 10.3 mg/dL 9.4 9.1 9.5    Current antihypertensive regimen:  Benazepril 40 mg - 1/2 tab daily Bisoprolol-HCTZ 5-6.25 mg daily  How often are you checking your Blood Pressure?   Patient reports she has been checking it weekly but not consistently.  Current home BP readings: 11/23/20 -142/82  11/15/20 -135/62 11/16/20 - 143/67  What recent interventions/DTPs have been made by any provider  to improve Blood  Pressure control since last CPP Visit:  Patient states her blood pressure has been good and she has not had any low or high readings overall.   Any recent hospitalizations or ED visits since last visit with CPP? No  What diet changes have been made to improve Blood Pressure Control?  Patient reports she has not been as good about low sodium but will work on being more aware of this.   What exercise is being done to improve your Blood Pressure Control?  Patient reports she has been trying to be more active since having arthritis in her back. She states she has been walking more and working on increasing that.   Adherence Review: Is the patient currently on ACE/ARB medication? Yes Does the patient have >5 day gap between last estimated fill dates? No    Star Rating Drugs: Benazepril 11/21/20 90 ds Pravastatin 11/03/20 90 ds  Jobe Gibbon, [place] Pharmacist Assistant  [phone]  Time Spent:  21 mins

## 2020-12-26 ENCOUNTER — Other Ambulatory Visit: Payer: Self-pay

## 2020-12-26 ENCOUNTER — Ambulatory Visit: Payer: Medicare Other | Attending: Surgery

## 2020-12-26 VITALS — Wt 218.4 lb

## 2020-12-26 DIAGNOSIS — Z483 Aftercare following surgery for neoplasm: Secondary | ICD-10-CM | POA: Insufficient documentation

## 2020-12-26 NOTE — Therapy (Signed)
[facility] [place], [place], [address] Phone: [phone]   Fax:  [phone]  Physical Therapy Treatment  Patient Details  Name: Kim Fuller MRN: [id number] Date of Birth: [birth year] No data recorded  Encounter Date: 12/26/2020   PT End of Session - 12/26/20 3329     Visit Number 2   # unchanged due to screen only   PT Start Time 5188    PT Stop Time 0952    PT Time Calculation (min) 11 min    Activity Tolerance Patient tolerated treatment well    Behavior During Therapy [facility] for tasks assessed/performed             Past Medical History:  Diagnosis Date   Abnormal finding on Pap smear    x1   Allergy    Arthritis    severe in back and left hip   Asthma    Cancer ([place])    breast cancer (left), chemotherapy and radiation   Cataract    had surgery   Diverticulosis    GERD (gastroesophageal reflux disease)    Hyperlipidemia    Hypertension    Mitral valve prolapse    Osteopenia    -1.5 @ femoral neck 11/2007   Reactive airway disease    years ago ; severe asthma attack "bornchitis asthma " per patient, no issues since     Past Surgical History:  Procedure Laterality Date   BREAST LUMPECTOMY WITH RADIOACTIVE SEED AND SENTINEL LYMPH NODE BIOPSY Left 09/16/2019   Procedure: LEFT BREAST LUMPECTOMY WITH RADIOACTIVE SEED AND SENTINEL LYMPH NODE BIOPSY;  Surgeon: Donnie Mesa, MD;  Location: [facility];  Service: General;  Laterality: Left;   CARDIOVASCULAR STRESS TEST  03/05/06   CARPAL TUNNEL RELEASE  2009    bilaterally   CATARACT EXTRACTION, BILATERAL     Dr Gershon Crane   COLONOSCOPY W/ POLYPECTOMY  1998   Tics @ 2004 & 2012; Dr Carlean Purl   HAND SURGERY     Trigger thumb   PARTIAL KNEE ARTHROPLASTY Left 04/03/2017   Procedure: Left knee medial unicompartmental arthroplasty;  Surgeon: Gaynelle Arabian, MD;  Location: [facility];  Service: Orthopedics;  Laterality: Left;  Adductor  canal block   PORTACATH PLACEMENT N/A 09/29/2019   Procedure: INSERTION PORT-A-CATH;  Surgeon: Donnie Mesa, MD;  Location: [facility];  Service: General;  Laterality: N/A;   TRIGGER FINGER RELEASE Right    TUBAL LIGATION      Vitals:   12/26/20 0944  Weight: 218 lb 6 oz (99.1 kg)     Subjective Assessment - 12/26/20 0944     Subjective Pt returns for 3 month L-Dex screen.    Pertinent History Patient was diagnosed on 08/19/2019 with left triple negative invasive ductal carcinoma breast cancer. It is grade III with a Ki67 of 50%. Patient reports she underwent a left lumpectomy and sentinel node biopsy (4 negative nodes) on 09/16/2019. She had a partial left knee replacement in 2018 and some chronic low back pain.                    L-DEX FLOWSHEETS - 12/26/20 0900       L-DEX LYMPHEDEMA SCREENING   Measurement Type Unilateral    L-DEX MEASUREMENT EXTREMITY Upper Extremity    POSITION  Standing    DOMINANT SIDE Right    At Risk Side Left    BASELINE SCORE (UNILATERAL) 6    L-DEX SCORE (UNILATERAL) 3.9  VALUE CHANGE (UNILAT) -2.1                                    PT Long Term Goals - 11/02/19 1128       PT LONG TERM GOAL #1   Title Patient will demonstrate she has regained full shoulder ROM and function post operatively compared to baselines.    Time 8    Period Weeks    Status Partially Met                   Plan - 12/26/20 0953     Clinical Impression Statement Pt returns for her 3 month L-Dex screen. Her change from baseline of -2.1 is WNLs so no further treatment is required at this time except to cont every 3 month L-Dex screens which pt is agreeable to . She did have questions about some of the HEP from the ABC class so briefly reviewed the Strength exs (bil UE 3 way raises) and "low and slow" progression of weights. She verbalized good understanding of this.    PT Next Visit Plan Cont every 3 month L-Dex screens for 2  years (until ~09/15/2021), then every 6 months.    Consulted and Agree with Plan of Care Patient             Patient will benefit from skilled therapeutic intervention in order to improve the following deficits and impairments:     Visit Diagnosis: Aftercare following surgery for neoplasm     Problem List Patient Active Problem List   Diagnosis Date Noted   Trigger finger, left middle finger 10/05/2020   Acquired trigger finger of left ring finger 10/05/2020   Acute diverticulitis 09/02/2020   Trigger finger, right ring finger 08/26/2020   Port-A-Cath in place 10/27/2019   Malignant neoplasm of upper-outer quadrant of left breast in female, estrogen receptor negative ([place]) 09/08/2019   Acute left-sided low back pain without sciatica 07/06/2019   Morbid obesity ([place]) 04/24/2019   Trigger finger, right middle finger 07/02/2017   Chronic shoulder bursitis, left 11/19/2016   OA (osteoarthritis) of knee 11/18/2015   Routine general medical examination at a health care facility 02/20/2015   GERD 12/31/2007   Osteopenia 10/31/2007   HYPERLIPIDEMIA 03/20/2007   Essential hypertension 03/20/2007    Otelia Limes, PTA 12/26/2020, 9:55 AM  [facility] [place], [place], [address] Phone: [phone]   Fax:  [phone]  Name: Kim Fuller MRN: [id number] Date of Birth: 23-Aug-1945

## 2021-01-12 ENCOUNTER — Telehealth: Payer: Self-pay | Admitting: Pharmacist

## 2021-01-12 NOTE — Progress Notes (Signed)
Chronic Care Management Pharmacy Assistant   Name: Paeton Bayes  MRN: [id number] DOB: [birth year]   Reason for Encounter: Disease State   Conditions to be addressed/monitored: HTN   Recent office visits:  None ID  Recent consult visits:  None ID  Hospital visits:  None in previous 6 months  Medications: Outpatient Encounter Medications as of 01/12/2021  Medication Sig   acetaminophen (TYLENOL) 500 MG tablet Take 500-1,000 mg by mouth every 6 (six) hours as needed (for pain.).   aspirin 81 MG chewable tablet Chew 81 mg by mouth at bedtime.    benazepril (LOTENSIN) 40 MG tablet Take 0.5 tablets (20 mg total) by mouth daily.   bisoprolol-hydrochlorothiazide (ZIAC) 5-6.25 MG tablet Take 1 tablet by mouth once daily   calcium carbonate (OSCAL) 1500 (600 Ca) MG TABS tablet Take 600 mg of elemental calcium by mouth 2 (two) times daily with a meal.   cetirizine (ZYRTEC) 10 MG tablet Take 10 mg by mouth as needed for allergies.   cholecalciferol (VITAMIN D3) 10 MCG (400 UNIT) TABS tablet Take 400 Units by mouth at bedtime.    dicyclomine (BENTYL) 10 MG capsule Take 1 capsule (10 mg total) by mouth 3 (three) times daily as needed for spasms.   esomeprazole (NEXIUM) 20 MG capsule Take 20 mg by mouth daily as needed (acid reflux/indigestion.).   fluticasone (FLONASE) 50 MCG/ACT nasal spray USE TWO SPRAY(S) IN EACH NOSTRIL DAILY   ibuprofen (ADVIL) 200 MG tablet Take 400 mg by mouth every 8 (eight) hours as needed (pain.).    Multiple Vitamin (MULTIVITAMIN WITH MINERALS) TABS tablet Take 1 tablet by mouth daily. Centrum Silver   Omega-3 Fatty Acids (FISH OIL) 1000 MG CAPS Take 1,000 mg by mouth 2 (two) times daily.   pravastatin (PRAVACHOL) 20 MG tablet Take 1 tablet (20 mg total) by mouth daily.   Propylene Glycol 0.6 % SOLN Place 1 drop 3 (three) times daily as needed into both eyes (dry eyes).   vitamin E 400 UNIT capsule Take 400 Units by mouth daily.   No facility-administered  encounter medications on file as of 01/12/2021.    Recent Office Vitals: BP Readings from Last 3 Encounters:  11/23/20 (!) 142/82  10/21/20 (!) 115/54  10/05/20 (!) 148/84   Pulse Readings from Last 3 Encounters:  11/23/20 (!) 58  10/21/20 63  10/05/20 61    Wt Readings from Last 3 Encounters:  12/26/20 218 lb 6 oz (99.1 kg)  11/23/20 220 lb (99.8 kg)  10/21/20 217 lb 9.6 oz (98.7 kg)     Kidney Function Lab Results  Component Value Date/Time   CREATININE 0.88 10/20/2020 09:16 AM   CREATININE 0.72 01/29/2020 11:46 AM   GFR 72.18 04/24/2019 09:32 AM   GFRNONAA >60 10/20/2020 09:16 AM   GFRAA >60 01/29/2020 11:46 AM    BMP Latest Ref Rng & Units 10/20/2020 01/29/2020 01/08/2020  Glucose 70 - 99 mg/dL 92 95 86  BUN 8 - 23 mg/dL '18 12 11  '$ Creatinine 0.44 - 1.00 mg/dL 0.88 0.72 0.67  Sodium 135 - 145 mmol/L 139 139 141  Potassium 3.5 - 5.1 mmol/L 4.1 3.7 3.7  Chloride 98 - 111 mmol/L 102 105 104  CO2 22 - 32 mmol/L '30 29 29  '$ Calcium 8.9 - 10.3 mg/dL 9.4 9.1 9.5     Contacted patient on 01/12/21 to discuss hypertension disease state  Current antihypertensive regimen:  Bisoprolol-hctz 5-6.25 mg daily Benazepril 40 mg take 1/2 tab daily  Patient verbally confirms she is taking the above medications as directed. Yes  How often are you checking your Blood Pressure? 1-2x per week  she checks her blood pressure in the morning before taking her medication.  Current home BP readings: Have not take today  DATE:             BP               PULSE  11/15/20  135/62  51 11/26/20  120/58  72 11/27/20  133/59  50 11/29/20  140/63  50  Wrist or arm cuff:arm cuff Caffeine intake:pepsi Salt intake:very little  OTC medications including pseudoephedrine or NSAIDs?calcium, fish oil, vit,d,e,c  Any readings above 180/120? No   What recent interventions/DTPs have been made by any provider to improve Blood Pressure control since last CPP Visit: none noted  Any recent hospitalizations  or ED visits since last visit with CPP? No  What diet changes have been made to improve Blood Pressure Control?  Patient states she has not made any changes to her diet  What exercise is being done to improve your Blood Pressure Control?  Patient states that she does not exercise  Adherence Review: Is the patient currently on ACE/ARB medication? Yes Does the patient have >5 day gap between last estimated fill dates? No   Star Rating Drugs:  Medication:  Last Fill: Day Supply Benazepril                 11/21/20              90 ds Pravastatin                11/03/20              90 ds   Care Gaps: Annual wellness visit in last year? Yes, 04/18/20 Most Recent BP reading:140/63 at home   No appointments scheduled within the next 30 days.   [place] Pharmacist Assistant [phone]   Time spent:40

## 2021-02-21 DIAGNOSIS — Z23 Encounter for immunization: Secondary | ICD-10-CM | POA: Diagnosis not present

## 2021-04-03 ENCOUNTER — Ambulatory Visit: Payer: Medicare Other | Attending: Surgery

## 2021-04-03 ENCOUNTER — Other Ambulatory Visit: Payer: Self-pay

## 2021-04-03 VITALS — Wt 228.2 lb

## 2021-04-03 DIAGNOSIS — Z483 Aftercare following surgery for neoplasm: Secondary | ICD-10-CM | POA: Insufficient documentation

## 2021-04-03 NOTE — Therapy (Signed)
[facility] @ [place], [place], [address] Phone: [phone]   Fax:  [phone]  Physical Therapy Treatment  Patient Details  Name: Kim Fuller MRN: [id number] Date of Birth: April 01, 1946 No data recorded  Encounter Date: 04/03/2021   PT End of Session - 04/03/21 1419     Visit Number 2   # unchanged due to screen only   PT Start Time 1415    PT Stop Time 1522    PT Time Calculation (min) 67 min             Past Medical History:  Diagnosis Date   Abnormal finding on Pap smear    x1   Allergy    Arthritis    severe in back and left hip   Asthma    Cancer ([place])    breast cancer (left), chemotherapy and radiation   Cataract    had surgery   Diverticulosis    GERD (gastroesophageal reflux disease)    Hyperlipidemia    Hypertension    Mitral valve prolapse    Osteopenia    -1.5 @ femoral neck 11/2007   Reactive airway disease    years ago ; severe asthma attack "bornchitis asthma " per patient, no issues since     Past Surgical History:  Procedure Laterality Date   BREAST LUMPECTOMY WITH RADIOACTIVE SEED AND SENTINEL LYMPH NODE BIOPSY Left 09/16/2019   Procedure: LEFT BREAST LUMPECTOMY WITH RADIOACTIVE SEED AND SENTINEL LYMPH NODE BIOPSY;  Surgeon: Donnie Mesa, MD;  Location: [facility];  Service: General;  Laterality: Left;   CARDIOVASCULAR STRESS TEST  03/05/06   CARPAL TUNNEL RELEASE  2009    bilaterally   CATARACT EXTRACTION, BILATERAL     Dr Gershon Crane   COLONOSCOPY W/ POLYPECTOMY  1998   Tics @ 2004 & 2012; Dr Carlean Purl   HAND SURGERY     Trigger thumb   PARTIAL KNEE ARTHROPLASTY Left 04/03/2017   Procedure: Left knee medial unicompartmental arthroplasty;  Surgeon: Gaynelle Arabian, MD;  Location: [facility];  Service: Orthopedics;  Laterality: Left;  Adductor canal block   PORTACATH PLACEMENT N/A 09/29/2019   Procedure: INSERTION PORT-A-CATH;  Surgeon: Donnie Mesa, MD;   Location: [facility];  Service: General;  Laterality: N/A;   TRIGGER FINGER RELEASE Right    TUBAL LIGATION      Vitals:   04/03/21 1418  Weight: 228 lb 4 oz (103.5 kg)     Subjective Assessment - 04/03/21 1417     Subjective Pt returns for 3 month L-Dex screen.    Pertinent History Patient was diagnosed on 08/19/2019 with left triple negative invasive ductal carcinoma breast cancer. It is grade III with a Ki67 of 50%. Patient reports she underwent a left lumpectomy and sentinel node biopsy (4 negative nodes) on 09/16/2019. She had a partial left knee replacement in 2018 and some chronic low back pain.                    L-DEX FLOWSHEETS - 04/03/21 1400       L-DEX LYMPHEDEMA SCREENING   Measurement Type Unilateral    L-DEX MEASUREMENT EXTREMITY Upper Extremity    POSITION  Standing    DOMINANT SIDE Right    At Risk Side Left    BASELINE SCORE (UNILATERAL) 6    L-DEX SCORE (UNILATERAL) 9.9    VALUE CHANGE (UNILAT) 3.9  PT Long Term Goals - 11/02/19 1128       PT LONG TERM GOAL #1   Title Patient will demonstrate she has regained full shoulder ROM and function post operatively compared to baselines.    Time 8    Period Weeks    Status Partially Met                   Plan - 04/03/21 1421     Clinical Impression Statement Pt returns for her 3 month L-Dex screen. Her change from baseline of 3.9 is WNLs so no further treatment is required at this time except to cont every 3 month L-Dex screens which pt is agreeable to.    PT Next Visit Plan Cont every 3 month L-Dex screens for 2 years (until ~09/15/2021), then every 6 months.    Consulted and Agree with Plan of Care Patient             Patient will benefit from skilled therapeutic intervention in order to improve the following deficits and impairments:     Visit Diagnosis: Aftercare following surgery for neoplasm     Problem  List Patient Active Problem List   Diagnosis Date Noted   Trigger finger, left middle finger 10/05/2020   Acquired trigger finger of left ring finger 10/05/2020   Acute diverticulitis 09/02/2020   Trigger finger, right ring finger 08/26/2020   Port-A-Cath in place 10/27/2019   Malignant neoplasm of upper-outer quadrant of left breast in female, estrogen receptor negative ([place]) 09/08/2019   Acute left-sided low back pain without sciatica 07/06/2019   Morbid obesity ([place]) 04/24/2019   Trigger finger, right middle finger 07/02/2017   Chronic shoulder bursitis, left 11/19/2016   OA (osteoarthritis) of knee 11/18/2015   Routine general medical examination at a health care facility 02/20/2015   GERD 12/31/2007   Osteopenia 10/31/2007   HYPERLIPIDEMIA 03/20/2007   Essential hypertension 03/20/2007    Otelia Limes, PTA 04/03/2021, 2:25 PM  [facility] @ [place], [place], [address] Phone: [phone]   Fax:  [phone]  Name: Kim Fuller MRN: [id number] Date of Birth: 1945-12-21

## 2021-04-05 DIAGNOSIS — Z01419 Encounter for gynecological examination (general) (routine) without abnormal findings: Secondary | ICD-10-CM | POA: Diagnosis not present

## 2021-04-17 ENCOUNTER — Ambulatory Visit: Payer: Medicare Other

## 2021-04-17 ENCOUNTER — Other Ambulatory Visit: Payer: Self-pay

## 2021-04-19 ENCOUNTER — Other Ambulatory Visit: Payer: Self-pay

## 2021-04-19 ENCOUNTER — Ambulatory Visit (INDEPENDENT_AMBULATORY_CARE_PROVIDER_SITE_OTHER): Payer: Medicare Other | Admitting: Internal Medicine

## 2021-04-19 ENCOUNTER — Encounter: Payer: Self-pay | Admitting: Internal Medicine

## 2021-04-19 VITALS — BP 126/72 | HR 82 | Resp 18 | Ht 61.0 in | Wt 226.4 lb

## 2021-04-19 DIAGNOSIS — M7552 Bursitis of left shoulder: Secondary | ICD-10-CM | POA: Diagnosis not present

## 2021-04-19 DIAGNOSIS — Z0000 Encounter for general adult medical examination without abnormal findings: Secondary | ICD-10-CM

## 2021-04-19 DIAGNOSIS — I10 Essential (primary) hypertension: Secondary | ICD-10-CM

## 2021-04-19 DIAGNOSIS — E782 Mixed hyperlipidemia: Secondary | ICD-10-CM | POA: Diagnosis not present

## 2021-04-19 DIAGNOSIS — K219 Gastro-esophageal reflux disease without esophagitis: Secondary | ICD-10-CM | POA: Diagnosis not present

## 2021-04-19 DIAGNOSIS — R7301 Impaired fasting glucose: Secondary | ICD-10-CM | POA: Diagnosis not present

## 2021-04-19 LAB — COMPREHENSIVE METABOLIC PANEL
ALT: 20 U/L (ref 0–35)
AST: 21 U/L (ref 0–37)
Albumin: 4.1 g/dL (ref 3.5–5.2)
Alkaline Phosphatase: 53 U/L (ref 39–117)
BUN: 15 mg/dL (ref 6–23)
CO2: 34 mEq/L — ABNORMAL HIGH (ref 19–32)
Calcium: 10 mg/dL (ref 8.4–10.5)
Chloride: 100 mEq/L (ref 96–112)
Creatinine, Ser: 0.73 mg/dL (ref 0.40–1.20)
GFR: 80.08 mL/min (ref >60.00)
Glucose, Bld: 84 mg/dL (ref 70–99)
Potassium: 4.1 mEq/L (ref 3.5–5.1)
Sodium: 140 mEq/L (ref 135–145)
Total Bilirubin: 0.7 mg/dL (ref 0.2–1.2)
Total Protein: 7.5 g/dL (ref 6.0–8.3)

## 2021-04-19 LAB — CBC
HCT: 41.2 % (ref 36.0–46.0)
Hemoglobin: 13.9 g/dL (ref 12.0–15.0)
MCHC: 33.7 g/dL (ref 30.0–36.0)
MCV: 100 fl (ref 78.0–100.0)
Platelets: 166 10*3/uL (ref 150.0–400.0)
RBC: 4.12 Mil/uL (ref 3.87–5.11)
RDW: 12.4 % (ref 11.5–15.5)
WBC: 5.8 10*3/uL (ref 4.0–10.5)

## 2021-04-19 LAB — LIPID PANEL
Cholesterol: 165 mg/dL (ref 0–200)
HDL: 48 mg/dL (ref >39.00)
NonHDL: 116.91
Total CHOL/HDL Ratio: 3
Triglycerides: 262 mg/dL — ABNORMAL HIGH (ref 0.0–149.0)
VLDL: 52.4 mg/dL — ABNORMAL HIGH (ref 0.0–40.0)

## 2021-04-19 LAB — LDL CHOLESTEROL, DIRECT: Direct LDL: 79 mg/dL

## 2021-04-19 LAB — HEMOGLOBIN A1C: Hgb A1c MFr Bld: 5.9 % (ref 4.6–6.5)

## 2021-04-19 NOTE — Assessment & Plan Note (Signed)
Checking lipid panel and adjust pravastatin 20 mg daily as needed. 

## 2021-04-19 NOTE — Progress Notes (Signed)
Subjective:   Patient ID: Kim Fuller, female    DOB: 1945-06-06, 75 y.o.   MRN: [id number]  HPI Here for medicare wellness, with new complaints. Please see A/P for status and treatment of chronic medical problems.   HPI #2: Here for follow up medical conditions.  Diet: heart healthy Physical activity: sedentary Depression/mood screen: negative Hearing: intact to whispered voice Visual acuity: grossly normal, performs annual eye exam  ADLs: capable Fall risk: none Home safety: good Cognitive evaluation: intact to orientation, naming, recall and repetition EOL planning: adv directives discussed  [place] Visit from 04/19/2021 in [facility] at [facility]  PHQ-2 Total Score 0       [place] from 03/27/2017 in [facility]  PHQ-9 Total Score 0      Fall Risk 01/29/2020 01/29/2020 02/16/2020 04/14/2020 04/19/2021  Falls in the past year? - - - 0 0  Was there an injury with Fall? - - - 0 0  Fall Risk Category Calculator - - - 0 0  Fall Risk Category - - - Low Low  Patient Fall Risk Level Low fall risk Low fall risk Low fall risk Low fall risk -  Patient at Risk for Falls Due to - - - No Fall Risks -  Fall risk Follow up - - - Falls evaluation completed -    I have personally reviewed and have noted 1. The patient's medical and social history - reviewed today no changes 2. Their use of alcohol, tobacco or illicit drugs 3. Their current medications and supplements 4. The patient's functional ability including ADL's, fall risks, home safety risks and hearing or visual impairment. 5. Diet and physical activities 6. Evidence for depression or mood disorders 7. Care team reviewed and updated 8.  The patient is not on an opioid pain medication.  Patient Care Team: Hoyt Koch, MD as PCP - General (Internal Medicine) Mauro Kaufmann, RN as Oncology Nurse Navigator Rockwell Germany, RN as  Oncology Nurse Navigator Donnie Mesa, MD as Consulting Physician (General Surgery) Nicholas Lose, MD as Consulting Physician (Hematology and Oncology) Kyung Rudd, MD as Consulting Physician (Radiation Oncology) Rutherford Guys, MD as Consulting Physician (Ophthalmology) Charlton Haws, [facility] as Pharmacist (Pharmacist) Past Medical History:  Diagnosis Date   Abnormal finding on Pap smear    x1   Allergy    Arthritis    severe in back and left hip   Asthma    Cancer [facility])    breast cancer (left), chemotherapy and radiation   Cataract    had surgery   Diverticulosis    GERD (gastroesophageal reflux disease)    Hyperlipidemia    Hypertension    Mitral valve prolapse    Osteopenia    -1.5 @ femoral neck 11/2007   Reactive airway disease    years ago ; severe asthma attack "bornchitis asthma " per patient, no issues since    Past Surgical History:  Procedure Laterality Date   BREAST LUMPECTOMY WITH RADIOACTIVE SEED AND SENTINEL LYMPH NODE BIOPSY Left 09/16/2019   Procedure: LEFT BREAST LUMPECTOMY WITH RADIOACTIVE SEED AND SENTINEL LYMPH NODE BIOPSY;  Surgeon: Donnie Mesa, MD;  Location: [facility];  Service: General;  Laterality: Left;   CARDIOVASCULAR STRESS TEST  03/05/06   CARPAL TUNNEL RELEASE  2009    bilaterally   CATARACT EXTRACTION, BILATERAL     Dr Gershon Crane   COLONOSCOPY W/ POLYPECTOMY  1998   Tics @  2004 & 2012; Dr Carlean Purl   HAND SURGERY     Trigger thumb   PARTIAL KNEE ARTHROPLASTY Left 04/03/2017   Procedure: Left knee medial unicompartmental arthroplasty;  Surgeon: Gaynelle Arabian, MD;  [facility];  Service: Orthopedics;  Laterality: Left;  Adductor canal block   PORTACATH PLACEMENT N/A 09/29/2019   Procedure: INSERTION PORT-A-CATH;  Surgeon: Donnie Mesa, MD;  Location: [facility];  Service: General;  Laterality: N/A;   TRIGGER FINGER RELEASE Right    TUBAL LIGATION     Family History  Problem Relation Age of Onset   Asthma Sister     Hypertension Sister    Hypertension Father    Heart attack Father        MI > 34   Prostate cancer Father    Lung cancer Father    Lung cancer Sister        smoker   Melanoma Sister    Osteoporosis Sister    Glaucoma Sister        X 2   Diverticulosis Sister        2 sisters   Lung cancer Sister    Colon polyps Sister        2 sisters with polyps   Diverticulitis Sister        1 sister   Hypertension Mother    Hypertension Brother    Skin cancer Sister    Diabetes Neg Hx    Stroke Neg Hx    Breast cancer Neg Hx    Colon cancer Neg Hx    Esophageal cancer Neg Hx    Stomach cancer Neg Hx    Review of Systems  Constitutional: Negative.   HENT: Negative.    Eyes: Negative.   Respiratory:  Negative for cough, chest tightness and shortness of breath.   Cardiovascular:  Negative for chest pain, palpitations and leg swelling.  Gastrointestinal:  Negative for abdominal distention, abdominal pain, constipation, diarrhea, nausea and vomiting.  Musculoskeletal: Negative.   Skin: Negative.   Neurological: Negative.   Psychiatric/Behavioral: Negative.     Objective:  Physical Exam Constitutional:      Appearance: She is well-developed. She is obese.  HENT:     Head: Normocephalic and atraumatic.  Cardiovascular:     Rate and Rhythm: Normal rate and regular rhythm.  Pulmonary:     Effort: Pulmonary effort is normal. No respiratory distress.     Breath sounds: Normal breath sounds. No wheezing or rales.  Abdominal:     General: Bowel sounds are normal. There is no distension.     Palpations: Abdomen is soft.     Tenderness: There is no abdominal tenderness. There is no rebound.  Musculoskeletal:     Cervical back: Normal range of motion.  Skin:    General: Skin is warm and dry.  Neurological:     Mental Status: She is alert and oriented to person, place, and time.     Coordination: Coordination normal.    Vitals:   04/19/21 1030  BP: 126/72  Pulse: 82  Resp: 18   SpO2: 98%  Weight: 226 lb 6.4 oz (102.7 kg)  Height: 5\' 1"  (1.549 m)   This visit occurred during the SARS-CoV-2 public health emergency.  Safety protocols were in place, including screening questions prior to the visit, additional usage of staff PPE, and extensive cleaning of exam room while observing appropriate contact time as indicated for disinfecting solutions.   Assessment & Plan:

## 2021-04-19 NOTE — Patient Instructions (Signed)
We will check the labs today. 

## 2021-04-19 NOTE — Assessment & Plan Note (Signed)
BP at goal on bisoprolol/hctz 5/6.25 mg daily and benazepril 20 mg daily. Checking CMP and adjust as needed.

## 2021-04-19 NOTE — Assessment & Plan Note (Signed)
Weight is increased since last year. Counseled about diet and exercise. Checking HgA1c for screening.

## 2021-04-19 NOTE — Assessment & Plan Note (Signed)
Taking nexium 20 mg daily and this is controlling symptoms.

## 2021-04-19 NOTE — Assessment & Plan Note (Signed)
Uses otc tylenol for pain.

## 2021-04-19 NOTE — Assessment & Plan Note (Signed)
Flu shot up to date. Covid-19 up to date. Pneumonia complete. Shingrix counseled to get at pharmacy. Tetanus counseled to get at pharmacy. Colonoscopy aged out prior to recall. Mammogram due 2023, pap smear aged out and dexa declines further. Counseled about sun safety and mole surveillance. Counseled about the dangers of distracted driving. Given 10 year screening recommendations.

## 2021-04-24 ENCOUNTER — Telehealth: Payer: Self-pay | Admitting: Internal Medicine

## 2021-04-24 ENCOUNTER — Telehealth (INDEPENDENT_AMBULATORY_CARE_PROVIDER_SITE_OTHER): Payer: Medicare Other | Admitting: Internal Medicine

## 2021-04-24 ENCOUNTER — Encounter: Payer: Self-pay | Admitting: Internal Medicine

## 2021-04-24 ENCOUNTER — Other Ambulatory Visit: Payer: Self-pay

## 2021-04-24 DIAGNOSIS — U071 COVID-19: Secondary | ICD-10-CM | POA: Insufficient documentation

## 2021-04-24 MED ORDER — MOLNUPIRAVIR EUA 200MG CAPSULE: 4.0000 | ORAL_CAPSULE | Freq: Two times a day (BID) | ORAL | 0 refills | Status: AC

## 2021-04-24 MED ORDER — PROMETHAZINE-DM 6.25-15 MG/5ML PO SYRP: 5.0000 mL | ORAL_SOLUTION | Freq: Four times a day (QID) | ORAL | 0 refills | Status: DC | PRN

## 2021-04-24 NOTE — Progress Notes (Signed)
Virtual Visit via Video Note  I connected with Kim Fuller on 00/51/10 at  3:00 PM EST by a video enabled telemedicine application and verified that I am speaking with the correct person using two identifiers.  The patient and the provider were at separate locations throughout the entire encounter. Patient location: home, Provider location: work   I discussed the limitations of evaluation and management by telemedicine and the availability of in person appointments. The patient expressed understanding and agreed to proceed. The patient and the provider were the only parties present for the visit unless noted in HPI below.  History of Present Illness: The patient is a 75 y.o. female with visit for covid-19 positive. Started 4-5 days ago.   Observations/Objective: Appearance: normal, breathing appears normal, apperas ill, casual grooming, abdomen does not appear distended, mental status is A and  O times 3  Assessment and Plan: See problem oriented charting  Follow Up Instructions: rx molnupiravir and promethazine/dm  I discussed the assessment and treatment plan with the patient. The patient was provided an opportunity to ask questions and all were answered. The patient agreed with the plan and demonstrated an understanding of the instructions.   The patient was advised to call back or seek an in-person evaluation if the symptoms worsen or if the condition fails to improve as anticipated.  Hoyt Koch, MD

## 2021-04-24 NOTE — Telephone Encounter (Signed)
Patient states she tested covid + on 12-15, patient states she has covid symptoms  Patient states she has taken muxinex, zyrtec, and tylenol w/ no relief  Offered patient a vv for tomorrow given that is the earliest appt, informed patient that would put her outside the 5 day window for antivirals, patient declined  Patient requesting a call back to discuss getting vv for today or otc medication recommendations

## 2021-04-24 NOTE — Telephone Encounter (Signed)
Spoke with the patient and her symptoms started on Thursday(12.15.2022). virtual has been scheduled for 3 pm today

## 2021-04-24 NOTE — Assessment & Plan Note (Signed)
Rx molnupiravir and promethazine/dm. Advised to continue zyrtec and flonase. Advised of CDC quarantine timeline.

## 2021-04-24 NOTE — Telephone Encounter (Signed)
Can you call and clarify when symptoms started? If they started day prior to covid-19 testing she is already outside the window. If not okay to double book this afternoon.

## 2021-05-02 ENCOUNTER — Encounter: Payer: Self-pay | Admitting: Internal Medicine

## 2021-05-02 ENCOUNTER — Other Ambulatory Visit: Payer: Self-pay | Admitting: Internal Medicine

## 2021-05-02 DIAGNOSIS — B370 Candidal stomatitis: Secondary | ICD-10-CM | POA: Insufficient documentation

## 2021-05-02 MED ORDER — CLOTRIMAZOLE 10 MG MT TROC: 10.0000 mg | Freq: Every day | OROMUCOSAL | 0 refills | Status: AC

## 2021-05-09 ENCOUNTER — Other Ambulatory Visit: Payer: Self-pay | Admitting: Internal Medicine

## 2021-05-09 MED ORDER — PROMETHAZINE-DM 6.25-15 MG/5ML PO SYRP: 5.0000 mL | ORAL_SOLUTION | Freq: Four times a day (QID) | ORAL | 0 refills | Status: DC | PRN

## 2021-05-14 ENCOUNTER — Other Ambulatory Visit: Payer: Self-pay | Admitting: Internal Medicine

## 2021-06-06 ENCOUNTER — Other Ambulatory Visit: Payer: Self-pay | Admitting: Internal Medicine

## 2021-06-27 ENCOUNTER — Telehealth: Payer: Medicare Other

## 2021-07-03 ENCOUNTER — Ambulatory Visit: Payer: Medicare Other | Attending: Surgery

## 2021-07-03 ENCOUNTER — Other Ambulatory Visit: Payer: Self-pay

## 2021-07-03 VITALS — Wt 228.4 lb

## 2021-07-03 DIAGNOSIS — Z483 Aftercare following surgery for neoplasm: Secondary | ICD-10-CM | POA: Insufficient documentation

## 2021-07-03 NOTE — Therapy (Signed)
[facility] @ [place], [place], [address] Phone: [phone]   Fax:  [phone]  Physical Therapy Treatment  Patient Details  Name: Kim Fuller MRN: [id number] Date of Birth: 14-Mar-1946 No data recorded  Encounter Date: 07/03/2021   PT End of Session - 07/03/21 1001     Visit Number 2   # unchanged due to screen only   PT Start Time 1000    PT Stop Time 1005    PT Time Calculation (min) 5 min    Activity Tolerance Patient tolerated treatment well    Behavior During Therapy WFL for tasks assessed/performed             Past Medical History:  Diagnosis Date   Abnormal finding on Pap smear    x1   Allergy    Arthritis    severe in back and left hip   Asthma    Cancer ([place])    breast cancer (left), chemotherapy and radiation   Cataract    had surgery   Diverticulosis    GERD (gastroesophageal reflux disease)    Hyperlipidemia    Hypertension    Mitral valve prolapse    Osteopenia    -1.5 @ femoral neck 11/2007   Reactive airway disease    years ago ; severe asthma attack "bornchitis asthma " per patient, no issues since     Past Surgical History:  Procedure Laterality Date   BREAST LUMPECTOMY WITH RADIOACTIVE SEED AND SENTINEL LYMPH NODE BIOPSY Left 09/16/2019   Procedure: LEFT BREAST LUMPECTOMY WITH RADIOACTIVE SEED AND SENTINEL LYMPH NODE BIOPSY;  Surgeon: Donnie Mesa, MD;  Location: [facility];  Service: General;  Laterality: Left;   CARDIOVASCULAR STRESS TEST  03/05/06   CARPAL TUNNEL RELEASE  2009    bilaterally   CATARACT EXTRACTION, BILATERAL     Dr Gershon Crane   COLONOSCOPY W/ POLYPECTOMY  1998   Tics @ 2004 & 2012; Dr Carlean Purl   HAND SURGERY     Trigger thumb   PARTIAL KNEE ARTHROPLASTY Left 04/03/2017   Procedure: Left knee medial unicompartmental arthroplasty;  Surgeon: Gaynelle Arabian, MD;  [facility];  Service: Orthopedics;  Laterality: Left;  Adductor  canal block   PORTACATH PLACEMENT N/A 09/29/2019   Procedure: INSERTION PORT-A-CATH;  Surgeon: Donnie Mesa, MD;  Location: [facility];  Service: General;  Laterality: N/A;   TRIGGER FINGER RELEASE Right    TUBAL LIGATION      Vitals:   07/03/21 1002  Weight: 228 lb 6 oz (103.6 kg)     Subjective Assessment - 07/03/21 1001     Subjective Pt returns for 3 month L-Dex screen.    Pertinent History Patient was diagnosed on 08/19/2019 with left triple negative invasive ductal carcinoma breast cancer. It is grade III with a Ki67 of 50%. Patient reports she underwent a left lumpectomy and sentinel node biopsy (4 negative nodes) on 09/16/2019. She had a partial left knee replacement in 2018 and some chronic low back pain.                    L-DEX FLOWSHEETS - 07/03/21 1000       L-DEX LYMPHEDEMA SCREENING   Measurement Type Unilateral    L-DEX MEASUREMENT EXTREMITY Upper Extremity    POSITION  Standing    DOMINANT SIDE Right    At Risk Side Left    BASELINE SCORE (UNILATERAL) 6    L-DEX SCORE (UNILATERAL)  9.2    VALUE CHANGE (UNILAT) 3.2                                     PT Long Term Goals - 11/02/19 1128       PT LONG TERM GOAL #1   Title Patient will demonstrate she has regained full shoulder ROM and function post operatively compared to baselines.    Time 8    Period Weeks    Status Partially Met                   Plan - 07/03/21 1003     Clinical Impression Statement Pt returns for her 3 month L-Dex screen. Her change from baseline of 3.2 is WNLs so no further treatment is required at this time except to cont every 3 month L-Dex screens which pt is agreeable to.    PT Next Visit Plan Cont every 3 month L-Dex screens for 2 years (until ~09/15/2021), then every 6 months.    Consulted and Agree with Plan of Care Patient             Patient will benefit from skilled therapeutic intervention in order to improve the following  deficits and impairments:     Visit Diagnosis: Aftercare following surgery for neoplasm     Problem List Patient Active Problem List   Diagnosis Date Noted   Thrush, oral 05/02/2021   COVID-19 04/24/2021   Trigger finger, left middle finger 10/05/2020   Acquired trigger finger of left ring finger 10/05/2020   Trigger finger, right ring finger 08/26/2020   Port-A-Cath in place 10/27/2019   Malignant neoplasm of upper-outer quadrant of left breast in female, estrogen receptor negative ([place]) 09/08/2019   Acute left-sided low back pain without sciatica 07/06/2019   Morbid obesity ([place]) 04/24/2019   Trigger finger, right middle finger 07/02/2017   Chronic shoulder bursitis, left 11/19/2016   OA (osteoarthritis) of knee 11/18/2015   Routine general medical examination at a health care facility 02/20/2015   GERD 12/31/2007   Osteopenia 10/31/2007   HYPERLIPIDEMIA 03/20/2007   Essential hypertension 03/20/2007    Otelia Limes, PTA 07/03/2021, 10:05 AM  [facility] @ [place], [place], [address] Phone: [phone]   Fax:  [phone]  Name: Gertude Benito MRN: [id number] Date of Birth: November 01, 1945

## 2021-07-04 NOTE — Progress Notes (Signed)
[place] Phone: [phone] Subjective:   Kim Fuller, am serving as a scribe for Dr. [name].  This visit occurred during the SARS-CoV-2 public health emergency.  Safety protocols were in place, including screening questions prior to the visit, additional usage of staff PPE, and extensive cleaning of exam room while observing appropriate contact time as indicated for disinfecting solutions.   I'm seeing this patient by the request  of:  Hoyt Koch, MD  CC: Bilateral hand pain  VQM:[id number]  Kim Fuller is a 76 y.o. female coming in with complaint of B hand pain. Last seen in July 2022 for trigger finger in multiple fingers. Patient states that her pain started one month ago. Pain in R ring finger and L middle finger. Having hard time using her hands to do anything. Wearing braces for pain relief.  Reviewed patient's chart and most recent CT in June of the chest, abdomen and pelvis did not show any recurrent cancer.      Past Medical History:  Diagnosis Date   Abnormal finding on Pap smear    x1   Allergy    Arthritis    severe in back and left hip   Asthma    Cancer ([place])    breast cancer (left), chemotherapy and radiation   Cataract    had surgery   Diverticulosis    GERD (gastroesophageal reflux disease)    Hyperlipidemia    Hypertension    Mitral valve prolapse    Osteopenia    -1.5 @ femoral neck 11/2007   Reactive airway disease    years ago ; severe asthma attack "bornchitis asthma " per patient, Fuller issues since    Past Surgical History:  Procedure Laterality Date   BREAST LUMPECTOMY WITH RADIOACTIVE SEED AND SENTINEL LYMPH NODE BIOPSY Left 09/16/2019   Procedure: LEFT BREAST LUMPECTOMY WITH RADIOACTIVE SEED AND SENTINEL LYMPH NODE BIOPSY;  Surgeon: Donnie Mesa, MD;  Location: [facility];  Service: General;  Laterality: Left;   CARDIOVASCULAR  STRESS TEST  03/05/06   CARPAL TUNNEL RELEASE  2009    bilaterally   CATARACT EXTRACTION, BILATERAL     Dr Gershon Crane   COLONOSCOPY W/ POLYPECTOMY  1998   Tics @ 2004 & 2012; Dr Carlean Purl   HAND SURGERY     Trigger thumb   PARTIAL KNEE ARTHROPLASTY Left 04/03/2017   Procedure: Left knee medial unicompartmental arthroplasty;  Surgeon: Gaynelle Arabian, MD;  Location: [facility];  Service: Orthopedics;  Laterality: Left;  Adductor canal block   PORTACATH PLACEMENT N/A 09/29/2019   Procedure: INSERTION PORT-A-CATH;  Surgeon: Donnie Mesa, MD;  Location: [facility];  Service: General;  Laterality: N/A;   TRIGGER FINGER RELEASE Right    TUBAL LIGATION     Social History   Socioeconomic History   Marital status: Married    Spouse name: Not on file   Number of children: 2   Years of education: Not on file   Highest education level: Not on file  Occupational History   Occupation: retired  Tobacco Use   Smoking status: Former    Types: Cigarettes    Quit date: 05/07/1970    Years since quitting: 51.1   Smokeless tobacco: Never   Tobacco comments:    Smoked as a teen  1962-1972,only up to 3 cigarettes/ day  Vaping Use   Vaping Use: Never used  Substance and Sexual Activity   Alcohol  use: Yes    Comment: occas   Drug use: Fuller   Sexual activity: Not Currently  Other Topics Concern   Not on file  Social History Narrative   Not on file   Social Determinants of Health   Financial Resource Strain: Not on file  Food Insecurity: Not on file  Transportation Needs: Not on file  Physical Activity: Not on file  Stress: Not on file  Social Connections: Not on file   Allergies  Allergen Reactions   Sulfonamide Derivatives Rash    Rash Because of a history of documented adverse serious drug reaction;Medi Alert bracelet  is recommended   Tramadol Hcl Other (See Comments)    REACTION: cold sweats, weak, fatigue   Family History  Problem Relation Age of Onset   Asthma Sister    Hypertension  Sister    Hypertension Father    Heart attack Father        MI > 65   Prostate cancer Father    Lung cancer Father    Lung cancer Sister        smoker   Melanoma Sister    Osteoporosis Sister    Glaucoma Sister        X 2   Diverticulosis Sister        2 sisters   Lung cancer Sister    Colon polyps Sister        2 sisters with polyps   Diverticulitis Sister        1 sister   Hypertension Mother    Hypertension Brother    Skin cancer Sister    Diabetes Neg Hx    Stroke Neg Hx    Breast cancer Neg Hx    Colon cancer Neg Hx    Esophageal cancer Neg Hx    Stomach cancer Neg Hx      Current Outpatient Medications (Cardiovascular):    benazepril (LOTENSIN) 40 MG tablet, Take 1/2 (one-half) tablet by mouth once daily   bisoprolol-hydrochlorothiazide (ZIAC) 5-6.25 MG tablet, Take 1 tablet by mouth once daily   pravastatin (PRAVACHOL) 20 MG tablet, Take 1 tablet by mouth once daily  Current Outpatient Medications (Respiratory):    cetirizine (ZYRTEC) 10 MG tablet, Take 10 mg by mouth as needed for allergies.   fluticasone (FLONASE) 50 MCG/ACT nasal spray, USE TWO SPRAY(S) IN EACH NOSTRIL DAILY   promethazine-dextromethorphan (PROMETHAZINE-DM) 6.25-15 MG/5ML syrup, Take 5 mLs by mouth 4 (four) times daily as needed for cough.  Current Outpatient Medications (Analgesics):    acetaminophen (TYLENOL) 500 MG tablet, Take 500-1,000 mg by mouth every 6 (six) hours as needed (for pain.).   aspirin 81 MG chewable tablet, Chew 81 mg by mouth at bedtime.    ibuprofen (ADVIL) 200 MG tablet, Take 400 mg by mouth every 8 (eight) hours as needed (pain.).    Current Outpatient Medications (Other):    calcium carbonate (OSCAL) 1500 (600 Ca) MG TABS tablet, Take 600 mg of elemental calcium by mouth 2 (two) times daily with a meal.   cholecalciferol (VITAMIN D3) 10 MCG (400 UNIT) TABS tablet, Take 400 Units by mouth at bedtime.    dicyclomine (BENTYL) 10 MG capsule, Take 1 capsule (10 mg total)  by mouth 3 (three) times daily as needed for spasms.   esomeprazole (NEXIUM) 20 MG capsule, Take 20 mg by mouth daily as needed (acid reflux/indigestion.).   Multiple Vitamin (MULTIVITAMIN WITH MINERALS) TABS tablet, Take 1 tablet by mouth daily. Centrum Silver   Omega-3  Fatty Acids (FISH OIL) 1000 MG CAPS, Take 1,000 mg by mouth 2 (two) times daily.   Propylene Glycol 0.6 % SOLN, Place 1 drop 3 (three) times daily as needed into both eyes (dry eyes).   vitamin E 400 UNIT capsule, Take 400 Units by mouth daily.   Reviewed prior external information including notes and imaging from  primary care provider As well as notes that were available from care everywhere and other healthcare systems.  Past medical history, social, surgical and family history all reviewed in electronic medical record.  Fuller pertanent information unless stated regarding to the chief complaint.   Review of Systems:  Fuller headache, visual changes, nausea, vomiting, diarrhea, constipation, dizziness, abdominal pain, skin rash, fevers, chills, night sweats, weight loss, swollen lymph nodes, body aches, joint swelling, chest pain, shortness of breath, mood changes. POSITIVE muscle aches  Objective  Blood pressure 122/72, pulse 60, height 5\' 1"  (1.549 m), weight 226 lb (102.5 kg), SpO2 97 %.   General: Fuller apparent distress alert and oriented x3 mood and affect normal, dressed appropriately.  Patient has gained some weight HEENT: Pupils equal, extraocular movements intact  Respiratory: Patient's speak in full sentences and does not appear short of breath  Cardiovascular: Fuller lower extremity edema, non tender, Fuller erythema  Gait normal with good balance and coordination.  MSK: Patient's hand exam shows the patient does have trigger nodules noted.  And seems to be the worst ones at the A2 pulley of the right ring finger and the left middle finger.  Severely tender to palpation.  Procedure: Real-time Ultrasound Guided Injection of  right ring flexor tendon sheath Device: GE Logiq Q7 Ultrasound guided injection is preferred based studies that show increased duration, increased effect, greater accuracy, decreased procedural pain, increased response rate, and decreased cost with ultrasound guided versus blind injection.  Verbal informed consent obtained.  Time-out conducted.  Noted Fuller overlying erythema, induration, or other signs of local infection.  Skin prepped in a sterile fashion.  Local anesthesia: Topical Ethyl chloride.  With sterile technique and under real time ultrasound guidance: With a 25-gauge half inch needle injected with 0.5 cc of 0.5% Marcaine and 0.5 cc of Kenalog 40 mg/mL Completed without difficulty  Pain immediately improved suggesting accurate placement of the medication.  Advised to call if fevers/chills, erythema, induration, drainage, or persistent bleeding.  Impression: Technically successful ultrasound guided injection.  Procedure: Real-time Ultrasound Guided Injection of left middle finger flexor tendon sheath Device: GE Logiq Q7 Ultrasound guided injection is preferred based studies that show increased duration, increased effect, greater accuracy, decreased procedural pain, increased response rate, and decreased cost with ultrasound guided versus blind injection.  Verbal informed consent obtained.  Time-out conducted.  Noted Fuller overlying erythema, induration, or other signs of local infection.  Skin prepped in a sterile fashion.  Local anesthesia: Topical Ethyl chloride.  With sterile technique and under real time ultrasound guidance: With a 25-gauge half inch needle injecting 0.5 cc of 0.5% Marcaine and 0.5 cc of Kenalog 40 mg/mL. Completed without difficulty  Pain immediately resolved suggesting accurate placement of the medication.  Advised to call if fevers/chills, erythema, induration, drainage, or persistent bleeding.  Impression: Technically successful ultrasound guided injection.     Impression and Recommendations:     The above documentation has been reviewed and is accurate and complete Lyndal Pulley, DO

## 2021-07-05 ENCOUNTER — Ambulatory Visit: Payer: Self-pay

## 2021-07-05 ENCOUNTER — Other Ambulatory Visit: Payer: Self-pay

## 2021-07-05 ENCOUNTER — Ambulatory Visit (INDEPENDENT_AMBULATORY_CARE_PROVIDER_SITE_OTHER): Payer: Medicare Other | Admitting: Family Medicine

## 2021-07-05 ENCOUNTER — Encounter: Payer: Self-pay | Admitting: Family Medicine

## 2021-07-05 VITALS — BP 122/72 | HR 60 | Ht 61.0 in | Wt 226.0 lb

## 2021-07-05 DIAGNOSIS — M79642 Pain in left hand: Secondary | ICD-10-CM | POA: Diagnosis not present

## 2021-07-05 DIAGNOSIS — M79641 Pain in right hand: Secondary | ICD-10-CM

## 2021-07-05 DIAGNOSIS — M65341 Trigger finger, right ring finger: Secondary | ICD-10-CM | POA: Diagnosis not present

## 2021-07-05 DIAGNOSIS — M65332 Trigger finger, left middle finger: Secondary | ICD-10-CM

## 2021-07-05 NOTE — Assessment & Plan Note (Signed)
Patient given an injection.  Tolerated the procedure well, patient knows that if this continues to give difficulty surgical intervention may be necessary but I do hope patient will continue to respond..  Increase activity slowly.  Follow-up again in12 weeks ?

## 2021-07-05 NOTE — Assessment & Plan Note (Signed)
Patient mated nearly a year at this time.  Discussed icing regimen and home exercises.  Encourage patient to monitor and use the bracing at night.  Discussed which activities to do and which ones to avoid.  Increase activity slowly.  Follow-up again in 6 to 8 weeks. ?

## 2021-07-05 NOTE — Patient Instructions (Signed)
See me again in 10-12 weeks ?Injected both hands today ?Continue to wear braces at night ?

## 2021-07-06 ENCOUNTER — Other Ambulatory Visit: Payer: Self-pay | Admitting: Internal Medicine

## 2021-07-11 ENCOUNTER — Other Ambulatory Visit: Payer: Self-pay | Admitting: Hematology and Oncology

## 2021-07-11 DIAGNOSIS — Z9889 Other specified postprocedural states: Secondary | ICD-10-CM

## 2021-07-25 DIAGNOSIS — Z961 Presence of intraocular lens: Secondary | ICD-10-CM | POA: Diagnosis not present

## 2021-07-28 DIAGNOSIS — C50912 Malignant neoplasm of unspecified site of left female breast: Secondary | ICD-10-CM | POA: Diagnosis not present

## 2021-08-25 ENCOUNTER — Ambulatory Visit
Admission: RE | Admit: 2021-08-25 | Discharge: 2021-08-25 | Disposition: A | Discharge status: Home / Self Care | Payer: Medicare Other | Source: Ambulatory Visit | Attending: Hematology and Oncology | Admitting: Hematology and Oncology

## 2021-08-25 DIAGNOSIS — D225 Melanocytic nevi of trunk: Secondary | ICD-10-CM | POA: Diagnosis not present

## 2021-08-25 DIAGNOSIS — Z9889 Other specified postprocedural states: Secondary | ICD-10-CM

## 2021-08-25 DIAGNOSIS — L821 Other seborrheic keratosis: Secondary | ICD-10-CM | POA: Diagnosis not present

## 2021-08-25 DIAGNOSIS — I8312 Varicose veins of left lower extremity with inflammation: Secondary | ICD-10-CM | POA: Diagnosis not present

## 2021-08-25 DIAGNOSIS — D2271 Melanocytic nevi of right lower limb, including hip: Secondary | ICD-10-CM | POA: Diagnosis not present

## 2021-08-25 DIAGNOSIS — L918 Other hypertrophic disorders of the skin: Secondary | ICD-10-CM | POA: Diagnosis not present

## 2021-08-25 DIAGNOSIS — D2272 Melanocytic nevi of left lower limb, including hip: Secondary | ICD-10-CM | POA: Diagnosis not present

## 2021-08-25 DIAGNOSIS — D2262 Melanocytic nevi of left upper limb, including shoulder: Secondary | ICD-10-CM | POA: Diagnosis not present

## 2021-08-25 DIAGNOSIS — Z853 Personal history of malignant neoplasm of breast: Secondary | ICD-10-CM | POA: Diagnosis not present

## 2021-08-25 DIAGNOSIS — I872 Venous insufficiency (chronic) (peripheral): Secondary | ICD-10-CM | POA: Diagnosis not present

## 2021-08-25 DIAGNOSIS — I8311 Varicose veins of right lower extremity with inflammation: Secondary | ICD-10-CM | POA: Diagnosis not present

## 2021-08-25 DIAGNOSIS — D2239 Melanocytic nevi of other parts of face: Secondary | ICD-10-CM | POA: Diagnosis not present

## 2021-08-25 DIAGNOSIS — D2261 Melanocytic nevi of right upper limb, including shoulder: Secondary | ICD-10-CM | POA: Diagnosis not present

## 2021-08-30 ENCOUNTER — Ambulatory Visit (INDEPENDENT_AMBULATORY_CARE_PROVIDER_SITE_OTHER): Payer: Medicare Other

## 2021-08-30 DIAGNOSIS — I10 Essential (primary) hypertension: Secondary | ICD-10-CM

## 2021-08-30 DIAGNOSIS — K219 Gastro-esophageal reflux disease without esophagitis: Secondary | ICD-10-CM

## 2021-08-30 DIAGNOSIS — E782 Mixed hyperlipidemia: Secondary | ICD-10-CM

## 2021-08-30 NOTE — Patient Instructions (Signed)
Visit Information ? ?Following are the goals we discussed today:  ? ?Manage My Medicine  ? ?Timeframe:  Long-Range Goal ?Priority:  Medium ?Start Date:  07/11/20                    ?Expected End Date:  08/31/2022                ? ?Follow Up Date 08/2022 ?  ?- call for medicine refill 2 or 3 days before it runs out ?- call if I am sick and can't take my medicine ?- keep a list of all the medicines I take; vitamins and herbals too ?- use a pillbox to sort medicine  ?  ?Why is this important?   ?These steps will help you keep on track with your medicines. ? ?Plan: Telephone follow up appointment with care management team member scheduled for:  12 months ?The patient has been provided with contact information for the care management team and has been advised to call with any health related questions or concerns.  ? ?Tomasa Blase, PharmD ?Clinical Pharmacist, [place]  ? ?Please call the care guide team at [phone] if you need to cancel or reschedule your appointment.  ? ?Patient verbalizes understanding of instructions and care plan provided today and agrees to view in [place]. Active MyChart status confirmed with patient.   ? ?

## 2021-08-30 NOTE — Progress Notes (Signed)
? ?Chronic Care Management ?Pharmacy Note ? ?08/30/2021 ?Name:  Kim Fuller MRN:  [id number] DOB:  10/02/1945 ? ?Summary: ?-Patient reports compliance to current medications, denies any issues or concerns  ?-Checking BP at home on occasion, averaging 120-130/58-60's - denies any symptoms or issues with hypotension ? ?Recommendations/Changes made from today's visit: ?-Recommending no changes to medications at this time, patient to continue to monitor BP at least once daily and reach out should BP control be lost prior to next appointment  ? ?Plan: ?-F/u in 1 year  ? ? ?Subjective: ?Kim Fuller is an 76 y.o. year old female who is a primary patient of Hoyt Koch, MD.  The CCM team was consulted for assistance with disease management and care coordination needs.   ? ?Engaged with patient by telephone for follow up visit in response to provider referral for pharmacy case management and/or care coordination services.  ? ?Consent to Services:  ?The patient was given information about Chronic Care Management services, agreed to services, and gave verbal consent prior to initiation of services.  Please see initial visit note for detailed documentation.  ? ?Patient Care Team: ?Hoyt Koch, MD as PCP - General (Internal Medicine) ?Mauro Kaufmann, RN as Oncology Nurse Navigator ?Rockwell Germany, RN as Oncology Nurse Navigator ?Donnie Mesa, MD as Consulting Physician (General Surgery) ?Nicholas Lose, MD as Consulting Physician (Hematology and Oncology) ?Kyung Rudd, MD as Consulting Physician (Radiation Oncology) ?Rutherford Guys, MD as Consulting Physician (Ophthalmology) ?Foltanski, Cleaster Corin, [facility] as Pharmacist (Pharmacist) ? ?Recent office visits: ?04/24/2021 - Dr. Sharlet Salina - [place] - COVID 19 - molnupiravir and promethazine DM rx'd  ?04/19/2021 - Dr. Sharlet Salina - no changes - f/u in 6-12 months  ? ?Recent consult visits: ?07/28/2021 - Dr. Georgette Dover [facility] - breast cancer f/u - no  palpable masses, no axillary lymphadenopathy - no changes to medications - f/u in 1 year  ?07/25/2021 - Dr. Gershon Crane - [facility] - no changes to medications  ?07/05/2021 - Dr. Tamala Julian - Sports Medicine - steroid injection given - f/u in 12 weeks  ? ?Hospital visits: ?None in previous 6 months ? ?Objective: ? ?Lab Results  ?Component Value Date  ? CREATININE 0.73 04/19/2021  ? BUN 15 04/19/2021  ? GFR 80.08 04/19/2021  ? GFRNONAA >60 10/20/2020  ? GFRAA >60 01/29/2020  ? NA 140 04/19/2021  ? K 4.1 04/19/2021  ? CALCIUM 10.0 04/19/2021  ? CO2 34 (H) 04/19/2021  ? ? ?Lab Results  ?Component Value Date/Time  ? HGBA1C 5.9 04/19/2021 10:54 AM  ? HGBA1C 5.8 04/18/2020 02:00 PM  ? GFR 80.08 04/19/2021 10:54 AM  ? GFR 72.18 04/24/2019 09:32 AM  ?  ?Last diabetic Eye exam: No results found for: HMDIABEYEEXA  ?Last diabetic Foot exam: No results found for: HMDIABFOOTEX  ? ?Lab Results  ?Component Value Date  ? CHOL 165 04/19/2021  ? HDL 48.00 04/19/2021  ? [place] 98 04/18/2020  ? LDLDIRECT 79.0 04/19/2021  ? TRIG 262.0 (H) 04/19/2021  ? CHOLHDL 3 04/19/2021  ? ? ? ?  Latest Ref Rng & Units 04/19/2021  ? 10:54 AM 10/20/2020  ?  9:16 AM 01/29/2020  ? 11:46 AM  ?Hepatic Function  ?Total Protein 6.0 - 8.3 g/dL 7.5   7.2   7.2    ?Albumin 3.5 - 5.2 g/dL 4.1   4.0   3.3    ?AST 0 - 37 U/L 21   22   23     ?  ALT 0 - 35 U/L 20   22   20     ?Alk Phosphatase 39 - 117 U/L 53   50   63    ?Total Bilirubin 0.2 - 1.2 mg/dL 0.7   0.8   0.5    ? ? ?Lab Results  ?Component Value Date/Time  ? TSH 1.56 02/05/2014 12:15 PM  ? TSH 1.32 01/29/2013 11:46 AM  ? ? ? ?  Latest Ref Rng & Units 04/19/2021  ? 10:54 AM 10/20/2020  ?  9:16 AM 01/29/2020  ? 11:46 AM  ?CBC  ?WBC 4.0 - 10.5 K/uL 5.8   5.7   6.0    ?Hemoglobin 12.0 - 15.0 g/dL 13.9   13.7   13.2    ?Hematocrit 36.0 - 46.0 % 41.2   42.0   40.5    ?Platelets 150.0 - 400.0 K/uL 166.0   164   180    ? ? ?Lab Results  ?Component Value Date/Time  ? VD25OH 64.37 02/05/2014 12:15 PM  ? VD25OH 50 11/01/2011  11:43 AM  ? VD25OH 51 08/10/2010 09:06 AM  ? ? ?Clinical ASCVD: No  ?The 10-year ASCVD risk score (Arnett DK, et al., 2019) is: 22.9% ?  Values used to calculate the score: ?    Age: 43 years ?    Sex: Female ?    Is Non-Hispanic African American: No ?    Diabetic: No ?    Tobacco smoker: No ?    Systolic Blood Pressure: 867 mmHg ?    Is BP treated: Yes ?    HDL Cholesterol: 48 mg/dL ?    Total Cholesterol: 165 mg/dL   ? ? ?  04/19/2021  ? 10:31 AM 04/14/2020  ? 12:32 PM 04/14/2019  ? 11:27 AM  ?Depression screen PHQ 2/9  ?Decreased Interest 0 0 0  ?Down, Depressed, Hopeless 0 0 0  ?PHQ - 2 Score 0 0 0  ?  ?DEXA 05/30/2020 ?Results: ?  Lumbar spine L1-L4 Femoral neck (FN) 33% distal radius  ?T-score 0.3 RFN: -1.8 ?LFN: -2.1 n/a  ?Change in BMD from previous DXA test (%) Up 4.3% Down 8.1% n/a  ?(*) statistically significant ?  ?Assessment: the BMD is low according to the [facility] classification for osteoporosis (see below). ?Fracture risk: moderate ?FRAX score: 10 year major osteoporotic risk: 12.0%. 10 year hip fracture risk: 3.0%. The thresholds for treatment are 20% and 3%, respectively. ?Comments: the technical quality of the study is good. ?Evaluation for secondary causes should be considered if clinically indicated.  ?Recommend optimizing calcium (1200 mg/day) and vitamin D (800 IU/day) intake.  ?Followup: Repeat BMD is appropriate after 2 years or after 1-2 years if starting treatment. ? ? ?Social History  ? ?Tobacco Use  ?Smoking Status Former  ? Types: Cigarettes  ? Quit date: 05/07/1970  ? Years since quitting: 51.3  ?Smokeless Tobacco Never  ?Tobacco Comments  ? Smoked as a teen  1962-1972,only up to 3 cigarettes/ day  ? ?BP Readings from Last 3 Encounters:  ?07/05/21 122/72  ?04/19/21 126/72  ?11/23/20 (!) 142/82  ? ?Pulse Readings from Last 3 Encounters:  ?07/05/21 60  ?04/19/21 82  ?11/23/20 (!) 58  ? ?Wt Readings from Last 3 Encounters:  ?07/05/21 226 lb (102.5 kg)  ?07/03/21 228 lb 6 oz (103.6 kg)  ?04/19/21  226 lb 6.4 oz (102.7 kg)  ? ? ?Assessment/Interventions: Review of patient past medical history, allergies, medications, health status, including review of consultants reports, laboratory and other test  data, was performed as part of comprehensive evaluation and provision of chronic care management services.  ? ?SDOH:  (Social Determinants of Health) assessments and interventions performed: Yes ? ?SDOH Screenings  ? ?Alcohol Screen: Not on file  ?Depression (PHQ2-9): Low Risk   ? PHQ-2 Score: 0  ?Financial Resource Strain: Not on file  ?Food Insecurity: Not on file  ?Housing: Not on file  ?Physical Activity: Not on file  ?Social Connections: Not on file  ?Stress: Not on file  ?Tobacco Use: Medium Risk  ? Smoking Tobacco Use: Former  ? Smokeless Tobacco Use: Never  ? Passive Exposure: Not on file  ?Transportation Needs: Not on file  ? ? ?[place] ? ?Allergies  ?Allergen Reactions  ? Sulfonamide Derivatives Rash  ?  Rash ?Because of a history of documented adverse serious drug reaction;Medi Alert bracelet  is recommended  ? Tramadol Hcl Other (See Comments)  ?  REACTION: cold sweats, weak, fatigue  ? ? ?Medications Reviewed Today   ? ? Reviewed by Lyndal Pulley, DO (Physician) on 07/05/21 at [phone]  Med List Status: <None>  ? ?Medication Order Taking? Sig Documenting Provider Last Dose Status Informant  ?acetaminophen (TYLENOL) 500 MG tablet [id number] Yes Take 500-1,000 mg by mouth every 6 (six) hours as needed (for pain.). [provider] Taking Active Self  ?aspirin 81 MG chewable tablet [id number] Yes Chew 81 mg by mouth at bedtime.  [provider] Taking Active Self  ?benazepril (LOTENSIN) 40 MG tablet [id number] Yes Take 1/2 (one-half) tablet by mouth once daily Hoyt Koch, MD Taking Active   ?bisoprolol-hydrochlorothiazide [facility]) 5-6.25 MG tablet [id number] Yes Take 1 tablet by mouth once daily Hoyt Koch, MD Taking Active   ?calcium carbonate (OSCAL) 1500 (600 Ca) MG  TABS tablet [id number] Yes Take 600 mg of elemental calcium by mouth 2 (two) times daily with a meal. [provider] Taking Active Self  ?cetirizine (ZYRTEC) 10 MG tablet [id number] Yes Take 10

## 2021-08-31 NOTE — Progress Notes (Deleted)
[name] Phone: [phone] Subjective:    I'm seeing this patient by the request  of:  Hoyt Koch, MD  CC:   [id number]  07/05/2021 Patient given an injection.  Tolerated the procedure well, patient knows that if this continues to give difficulty surgical intervention may be necessary but I do hope patient will continue to respond..  Increase activity slowly.  Follow-up again in12 weeks  Patient mated nearly a year at this time.  Discussed icing regimen and home exercises.  Encourage patient to monitor and use the bracing at night.  Discussed which activities to do and which ones to avoid.  Increase activity slowly.  Follow-up again in 6 to 8 weeks.  Update 7/0/1779 Kim Fuller is a 76 y.o. female coming in with complaint of L middle finger and R ring finger. Patient states      Past Medical History:  Diagnosis Date   Abnormal finding on Pap smear    x1   Allergy    Arthritis    severe in back and left hip   Asthma    Cancer ([place])    breast cancer (left), chemotherapy and radiation   Cataract    had surgery   Diverticulosis    GERD (gastroesophageal reflux disease)    Hyperlipidemia    Hypertension    Mitral valve prolapse    Osteopenia    -1.5 @ femoral neck 11/2007   Reactive airway disease    years ago ; severe asthma attack "bornchitis asthma " per patient, no issues since    Past Surgical History:  Procedure Laterality Date   BREAST LUMPECTOMY WITH RADIOACTIVE SEED AND SENTINEL LYMPH NODE BIOPSY Left 09/16/2019   Procedure: LEFT BREAST LUMPECTOMY WITH RADIOACTIVE SEED AND SENTINEL LYMPH NODE BIOPSY;  Surgeon: Donnie Mesa, MD;  Location: [facility];  Service: General;  Laterality: Left;   CARDIOVASCULAR STRESS TEST  03/05/06   CARPAL TUNNEL RELEASE  2009    bilaterally   CATARACT EXTRACTION, BILATERAL     Dr Gershon Crane   COLONOSCOPY W/ POLYPECTOMY  1998    Tics @ 2004 & 2012; Dr Carlean Purl   HAND SURGERY     Trigger thumb   PARTIAL KNEE ARTHROPLASTY Left 04/03/2017   Procedure: Left knee medial unicompartmental arthroplasty;  Surgeon: Gaynelle Arabian, MD;  Location: [facility];  Service: Orthopedics;  Laterality: Left;  Adductor canal block   PORTACATH PLACEMENT N/A 09/29/2019   Procedure: INSERTION PORT-A-CATH;  Surgeon: Donnie Mesa, MD;  Location: [facility];  Service: General;  Laterality: N/A;   TRIGGER FINGER RELEASE Right    TUBAL LIGATION     Social History   Socioeconomic History   Marital status: Married    Spouse name: Not on file   Number of children: 2   Years of education: Not on file   Highest education level: Not on file  Occupational History   Occupation: retired  Tobacco Use   Smoking status: Former    Types: Cigarettes    Quit date: 05/07/1970    Years since quitting: 51.3   Smokeless tobacco: Never   Tobacco comments:    Smoked as a teen  1962-1972,only up to 3 cigarettes/ day  Vaping Use   Vaping Use: Never used  Substance and Sexual Activity   Alcohol use: Yes    Comment: occas   Drug use: No   Sexual activity: Not Currently  Other Topics Concern  Not on file  Social History Narrative   Not on file   Social Determinants of Health   Financial Resource Strain: Not on file  Food Insecurity: Not on file  Transportation Needs: Not on file  Physical Activity: Not on file  Stress: Not on file  Social Connections: Not on file   Allergies  Allergen Reactions   Sulfonamide Derivatives Rash    Rash Because of a history of documented adverse serious drug reaction;Medi Alert bracelet  is recommended   Tramadol Hcl Other (See Comments)    REACTION: cold sweats, weak, fatigue   Family History  Problem Relation Age of Onset   Asthma Sister    Hypertension Sister    Hypertension Father    Heart attack Father        MI > 3   Prostate cancer Father    Lung cancer Father    Lung cancer Sister        smoker    Melanoma Sister    Osteoporosis Sister    Glaucoma Sister        X 2   Diverticulosis Sister        2 sisters   Lung cancer Sister    Colon polyps Sister        2 sisters with polyps   Diverticulitis Sister        1 sister   Hypertension Mother    Hypertension Brother    Skin cancer Sister    Diabetes Neg Hx    Stroke Neg Hx    Breast cancer Neg Hx    Colon cancer Neg Hx    Esophageal cancer Neg Hx    Stomach cancer Neg Hx      Current Outpatient Medications (Cardiovascular):    benazepril (LOTENSIN) 40 MG tablet, Take 1/2 (one-half) tablet by mouth once daily   bisoprolol-hydrochlorothiazide (ZIAC) 5-6.25 MG tablet, Take 1 tablet by mouth once daily   pravastatin (PRAVACHOL) 20 MG tablet, Take 1 tablet by mouth once daily  Current Outpatient Medications (Respiratory):    cetirizine (ZYRTEC) 10 MG tablet, Take 10 mg by mouth as needed for allergies.   fluticasone (FLONASE) 50 MCG/ACT nasal spray, USE TWO SPRAY(S) IN EACH NOSTRIL DAILY  Current Outpatient Medications (Analgesics):    acetaminophen (TYLENOL) 500 MG tablet, Take 500-1,000 mg by mouth every 6 (six) hours as needed (for pain.).   aspirin 81 MG chewable tablet, Chew 81 mg by mouth at bedtime.    ibuprofen (ADVIL) 200 MG tablet, Take 400 mg by mouth every 8 (eight) hours as needed (pain.).    Current Outpatient Medications (Other):    calcium carbonate (OSCAL) 1500 (600 Ca) MG TABS tablet, Take 600 mg of elemental calcium by mouth 2 (two) times daily with a meal.   cholecalciferol (VITAMIN D3) 10 MCG (400 UNIT) TABS tablet, Take 400 Units by mouth at bedtime.    dicyclomine (BENTYL) 10 MG capsule, Take 1 capsule (10 mg total) by mouth 3 (three) times daily as needed for spasms.   esomeprazole (NEXIUM) 20 MG capsule, Take 20 mg by mouth daily as needed (acid reflux/indigestion.).   Multiple Vitamin (MULTIVITAMIN WITH MINERALS) TABS tablet, Take 1 tablet by mouth daily. Centrum Silver   Omega-3 Fatty Acids (FISH  OIL) 1000 MG CAPS, Take 1,000 mg by mouth 2 (two) times daily.   Propylene Glycol 0.6 % SOLN, Place 1 drop 3 (three) times daily as needed into both eyes (dry eyes).   vitamin E 400 UNIT capsule,  Take 400 Units by mouth daily.   Reviewed prior external information including notes and imaging from  primary care provider As well as notes that were available from care everywhere and other healthcare systems.  Past medical history, social, surgical and family history all reviewed in electronic medical record.  No pertanent information unless stated regarding to the chief complaint.   Review of Systems:  No headache, visual changes, nausea, vomiting, diarrhea, constipation, dizziness, abdominal pain, skin rash, fevers, chills, night sweats, weight loss, swollen lymph nodes, body aches, joint swelling, chest pain, shortness of breath, mood changes. POSITIVE muscle aches  Objective  There were no vitals taken for this visit.   General: No apparent distress alert and oriented x3 mood and affect normal, dressed appropriately.  HEENT: Pupils equal, extraocular movements intact  Respiratory: Patient's speak in full sentences and does not appear short of breath  Cardiovascular: No lower extremity edema, non tender, no erythema  Gait normal with good balance and coordination.  MSK:  Non tender with full range of motion and good stability and symmetric strength and tone of shoulders, elbows, wrist, hip, knee and ankles bilaterally.     Impression and Recommendations:     The above documentation has been reviewed and is accurate and complete Jacqualin Combes

## 2021-09-03 DIAGNOSIS — I10 Essential (primary) hypertension: Secondary | ICD-10-CM

## 2021-09-03 DIAGNOSIS — E782 Mixed hyperlipidemia: Secondary | ICD-10-CM

## 2021-09-11 ENCOUNTER — Ambulatory Visit: Payer: Medicare Other | Admitting: Family Medicine

## 2021-09-13 ENCOUNTER — Other Ambulatory Visit: Payer: Self-pay | Admitting: Internal Medicine

## 2021-09-21 ENCOUNTER — Telehealth: Payer: Self-pay | Admitting: Hematology and Oncology

## 2021-09-21 NOTE — Telephone Encounter (Signed)
Rescheduled appointment per provider PAL. Patient is aware of the changes made to her upcoming appointment. 

## 2021-09-26 ENCOUNTER — Ambulatory Visit (INDEPENDENT_AMBULATORY_CARE_PROVIDER_SITE_OTHER): Payer: Medicare Other | Admitting: Internal Medicine

## 2021-09-26 VITALS — BP 122/76 | HR 58 | Temp 99.0°F | Ht 61.0 in | Wt 228.0 lb

## 2021-09-26 DIAGNOSIS — R350 Frequency of micturition: Secondary | ICD-10-CM | POA: Diagnosis not present

## 2021-09-26 DIAGNOSIS — I10 Essential (primary) hypertension: Secondary | ICD-10-CM

## 2021-09-26 DIAGNOSIS — M545 Low back pain, unspecified: Secondary | ICD-10-CM | POA: Diagnosis not present

## 2021-09-26 LAB — URINALYSIS, ROUTINE W REFLEX MICROSCOPIC
Bilirubin Urine: NEGATIVE
Ketones, ur: NEGATIVE
Nitrite: NEGATIVE
Specific Gravity, Urine: 1.01 (ref 1.000–1.030)
Total Protein, Urine: NEGATIVE
Urine Glucose: NEGATIVE
Urobilinogen, UA: 0.2 (ref 0.0–1.0)
pH: 6 (ref 5.0–8.0)

## 2021-09-26 MED ORDER — CEPHALEXIN 500 MG PO CAPS: 500.0000 mg | ORAL_CAPSULE | Freq: Three times a day (TID) | ORAL | 0 refills | Status: DC

## 2021-09-26 MED ORDER — CYCLOBENZAPRINE HCL 5 MG PO TABS: 5.0000 mg | ORAL_TABLET | Freq: Three times a day (TID) | ORAL | 1 refills | Status: DC | PRN

## 2021-09-26 NOTE — Patient Instructions (Signed)
Please take all new medication as prescribed - the cephalexin antibiotic, and the generic flexeril muscle relaxer as needed  Your specimen will be sent to the LAB  You will be contacted by phone if any changes need to be made immediately.  Otherwise, you will receive a letter about your results with an explanation, but please check with MyChart first.  Please continue all other medications as before, and refills have been done if requested.  Please have the pharmacy call with any other refills you may need.  Please keep your appointments with your specialists as you may have planned

## 2021-09-26 NOTE — Progress Notes (Unsigned)
Patient ID: Kim Fuller, female   DOB: 09-20-1945, 76 y.o.   MRN: [id number]        Chief Complaint: follow up urinary frequency, left lower back pain       HPI:  Kim Fuller is a 76 y.o. female here with 3 days onset urinary frequency and mild dysuria, but Denies urinary symptoms such as urgency, flank pain, hematuria or n/v, fever, chills.  Also, Pt c/o 2 days onset left LBP without bowel or bladder change, fever, wt loss,  worsening LE pain/numbness/weakness, gait change or falls, but is Dull, constant, mild to mod, worse to stand up or bend.  Pt denies chest pain, increased sob or doe, wheezing, orthopnea, PND, increased LE swelling, palpitations, dizziness or syncope.   Pt denies polydipsia, polyuria, or new focal neuro s/s.         Wt Readings from Last 3 Encounters:  09/26/21 228 lb (103.4 kg)  07/05/21 226 lb (102.5 kg)  07/03/21 228 lb 6 oz (103.6 kg)   BP Readings from Last 3 Encounters:  09/26/21 122/76  07/05/21 122/72  04/19/21 126/72         Past Medical History:  Diagnosis Date   Abnormal finding on Pap smear    x1   Allergy    Arthritis    severe in back and left hip   Asthma    Cancer ([place])    breast cancer (left), chemotherapy and radiation   Cataract    had surgery   Diverticulosis    GERD (gastroesophageal reflux disease)    Hyperlipidemia    Hypertension    Mitral valve prolapse    Osteopenia    -1.5 @ femoral neck 11/2007   Reactive airway disease    years ago ; severe asthma attack "bornchitis asthma " per patient, no issues since    Past Surgical History:  Procedure Laterality Date   BREAST LUMPECTOMY WITH RADIOACTIVE SEED AND SENTINEL LYMPH NODE BIOPSY Left 09/16/2019   Procedure: LEFT BREAST LUMPECTOMY WITH RADIOACTIVE SEED AND SENTINEL LYMPH NODE BIOPSY;  Surgeon: Donnie Mesa, MD;  Location: [facility];  Service: General;  Laterality: Left;   CARDIOVASCULAR STRESS TEST  03/05/06   CARPAL TUNNEL RELEASE  2009     bilaterally   CATARACT EXTRACTION, BILATERAL     Dr Gershon Crane   COLONOSCOPY W/ POLYPECTOMY  1998   Tics @ 2004 & 2012; Dr Carlean Purl   HAND SURGERY     Trigger thumb   PARTIAL KNEE ARTHROPLASTY Left 04/03/2017   Procedure: Left knee medial unicompartmental arthroplasty;  Surgeon: Gaynelle Arabian, MD;  Location[facility];  Service: Orthopedics;  Laterality: Left;  Adductor canal block   PORTACATH PLACEMENT N/A 09/29/2019   Procedure: INSERTION PORT-A-CATH;  Surgeon: Donnie Mesa, MD;  Location: [facility];  Service: General;  Laterality: N/A;   TRIGGER FINGER RELEASE Right    TUBAL LIGATION      reports that she quit smoking about 51 years ago. Her smoking use included cigarettes. She has never used smokeless tobacco. She reports current alcohol use. She reports that she does not use drugs. family history includes Asthma in her sister; Colon polyps in her sister; Diverticulitis in her sister; Diverticulosis in her sister; Glaucoma in her sister; Heart attack in her father; Hypertension in her brother, father, mother, and sister; Lung cancer in her father, sister, and sister; Melanoma in her sister; Osteoporosis in her sister; Prostate cancer in her father; Skin cancer in her sister. Allergies  Allergen Reactions   Sulfonamide Derivatives Rash    Rash Because of a history of documented adverse serious drug reaction;Medi Alert bracelet  is recommended   Tramadol Hcl Other (See Comments)    REACTION: cold sweats, weak, fatigue   Current Outpatient Medications on File Prior to Visit  Medication Sig Dispense Refill   acetaminophen (TYLENOL) 500 MG tablet Take 500-1,000 mg by mouth every 6 (six) hours as needed (for pain.).     aspirin 81 MG chewable tablet Chew 81 mg by mouth at bedtime.      benazepril (LOTENSIN) 40 MG tablet Take 1/2 (one-half) tablet by mouth once daily 45 tablet 0   bisoprolol-hydrochlorothiazide (ZIAC) 5-6.25 MG tablet Take 1 tablet by mouth once daily 90 tablet 0   calcium  carbonate (OSCAL) 1500 (600 Ca) MG TABS tablet Take 600 mg of elemental calcium by mouth 2 (two) times daily with a meal.     cetirizine (ZYRTEC) 10 MG tablet Take 10 mg by mouth as needed for allergies.     cholecalciferol (VITAMIN D3) 10 MCG (400 UNIT) TABS tablet Take 400 Units by mouth at bedtime.      dicyclomine (BENTYL) 10 MG capsule Take 1 capsule (10 mg total) by mouth 3 (three) times daily as needed for spasms. 30 capsule 0   esomeprazole (NEXIUM) 20 MG capsule Take 20 mg by mouth daily as needed (acid reflux/indigestion.).     fluticasone (FLONASE) 50 MCG/ACT nasal spray USE TWO SPRAY(S) IN EACH NOSTRIL DAILY 48 g 6   ibuprofen (ADVIL) 200 MG tablet Take 400 mg by mouth every 8 (eight) hours as needed (pain.).      Multiple Vitamin (MULTIVITAMIN WITH MINERALS) TABS tablet Take 1 tablet by mouth daily. Centrum Silver     Omega-3 Fatty Acids (FISH OIL) 1000 MG CAPS Take 1,000 mg by mouth 2 (two) times daily.     pravastatin (PRAVACHOL) 20 MG tablet Take 1 tablet by mouth once daily 90 tablet 3   Propylene Glycol 0.6 % SOLN Place 1 drop 3 (three) times daily as needed into both eyes (dry eyes).     vitamin E 400 UNIT capsule Take 400 Units by mouth daily.     No current facility-administered medications on file prior to visit.        ROS:  All others reviewed and negative.  Objective        PE:  BP 122/76 (BP Location: Right Arm, Patient Position: Sitting, Cuff Size: Large)   Pulse (!) 58   Temp 99 F (37.2 C) (Oral)   Ht '5\' 1"'$  (1.549 m)   Wt 228 lb (103.4 kg)   LMP  (LMP Unknown)   SpO2 93%   BMI 43.08 kg/m                 Constitutional: Pt appears in NAD               HENT: Head: NCAT.                Right Ear: External ear normal.                 Left Ear: External ear normal.                Eyes: . Pupils are equal, round, and reactive to light. Conjunctivae and EOM are normal               Nose: without d/c or deformity  Neck: Neck supple. Gross normal  ROM               Cardiovascular: Normal rate and regular rhythm.                 Pulmonary/Chest: Effort normal and breath sounds without rales or wheezing.                Abd:  Soft, mild low mid abd tender, ND, + BS, no organomegaly               Spine nontender in midline, + left lumbar paravertebral spasm/tender               Neurological: Pt is alert. At baseline orientation, motor grossly intact               Skin: Skin is warm. No rashes, no other new lesions, LE edema - none               Psychiatric: Pt behavior is normal without agitation   Micro: none  Cardiac tracings I have personally interpreted today:  none  Pertinent Radiological findings (summarize): none   Lab Results  Component Value Date   WBC 5.8 04/19/2021   HGB 13.9 04/19/2021   HCT 41.2 04/19/2021   PLT 166.0 04/19/2021   GLUCOSE 84 04/19/2021   CHOL 165 04/19/2021   TRIG 262.0 (H) 04/19/2021   HDL 48.00 04/19/2021   LDLDIRECT 79.0 04/19/2021   LDLCALC 98 04/18/2020   ALT 20 04/19/2021   AST 21 04/19/2021   NA 140 04/19/2021   K 4.1 04/19/2021   CL 100 04/19/2021   CREATININE 0.73 04/19/2021   BUN 15 04/19/2021   CO2 34 (H) 04/19/2021   TSH 1.56 02/05/2014   INR 1.03 03/27/2017   HGBA1C 5.9 04/19/2021   Assessment/Plan:  Kim Fuller is a 76 y.o. White or Caucasian [1] female with  has a past medical history of Abnormal finding on Pap smear, Allergy, Arthritis, Asthma, Cancer ([place]), Cataract, Diverticulosis, GERD (gastroesophageal reflux disease), Hyperlipidemia, Hypertension, Mitral valve prolapse, Osteopenia, and Reactive airway disease.  Urinary frequency Exam c/w likely uti, for cephalexin asd, urine studies,  to f/u any worsening symptoms or concerns  Acute left-sided low back pain without sciatica C/w msk strain, for flexeril prn,  to f/u any worsening symptoms or concerns  Essential hypertension BP Readings from Last 3 Encounters:  09/26/21 122/76  07/05/21 122/72  04/19/21  126/72   Stable, pt to continue medical treatment lotensin, ziac  Followup: Return if symptoms worsen or fail to improve.  Cathlean Cower, MD 09/28/2021 8:16 PM [facility]

## 2021-09-28 ENCOUNTER — Encounter: Payer: Self-pay | Admitting: Internal Medicine

## 2021-09-28 DIAGNOSIS — R350 Frequency of micturition: Secondary | ICD-10-CM | POA: Insufficient documentation

## 2021-09-28 IMAGING — DX DG LUMBAR SPINE COMPLETE 4+V
5 series · 5 of 5 positions shown · non-contrast
Comparison: None.

CLINICAL DATA: Pain

EXAM:
LUMBAR SPINE - COMPLETE 4+ VIEW

[l-spine ap]
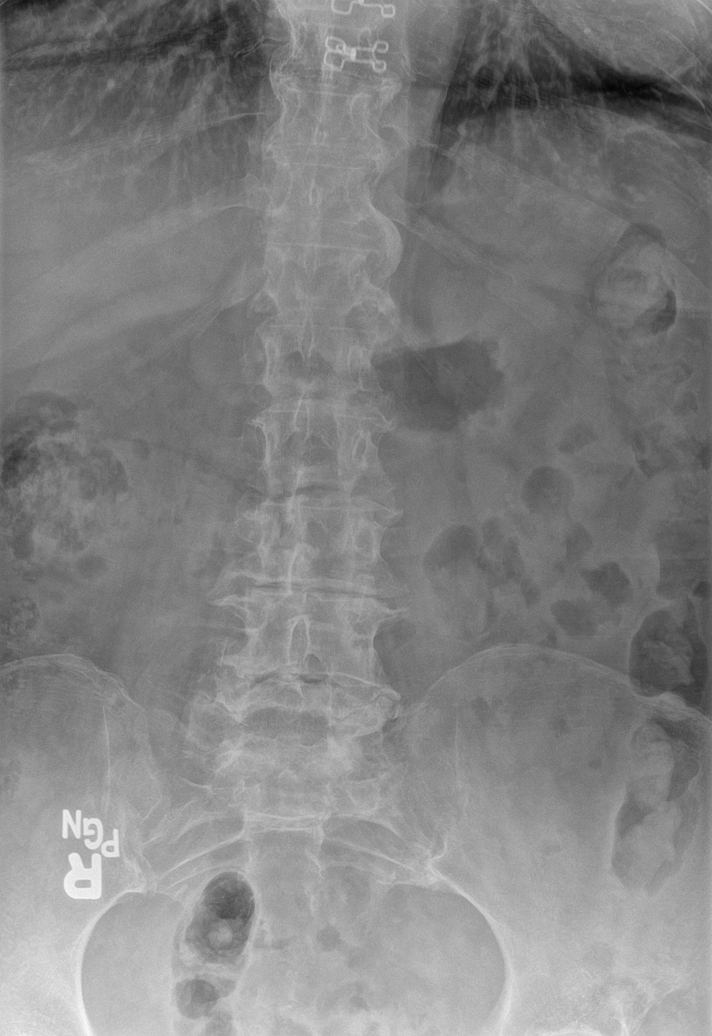

[l-spine obl (1 of 2)]
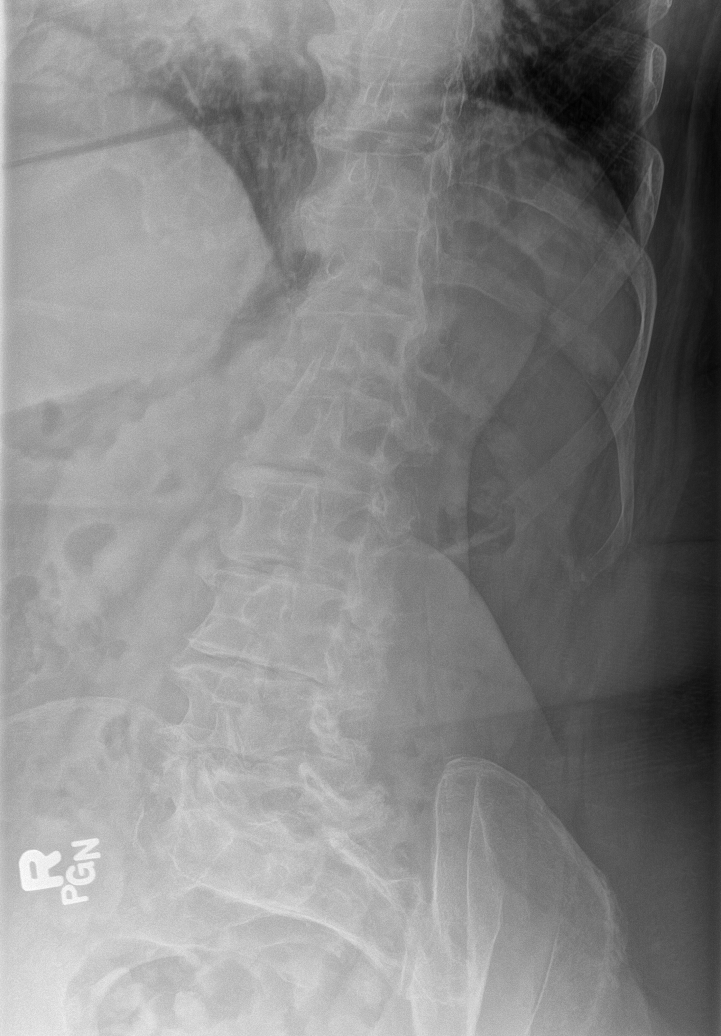

[l-spine lateral]
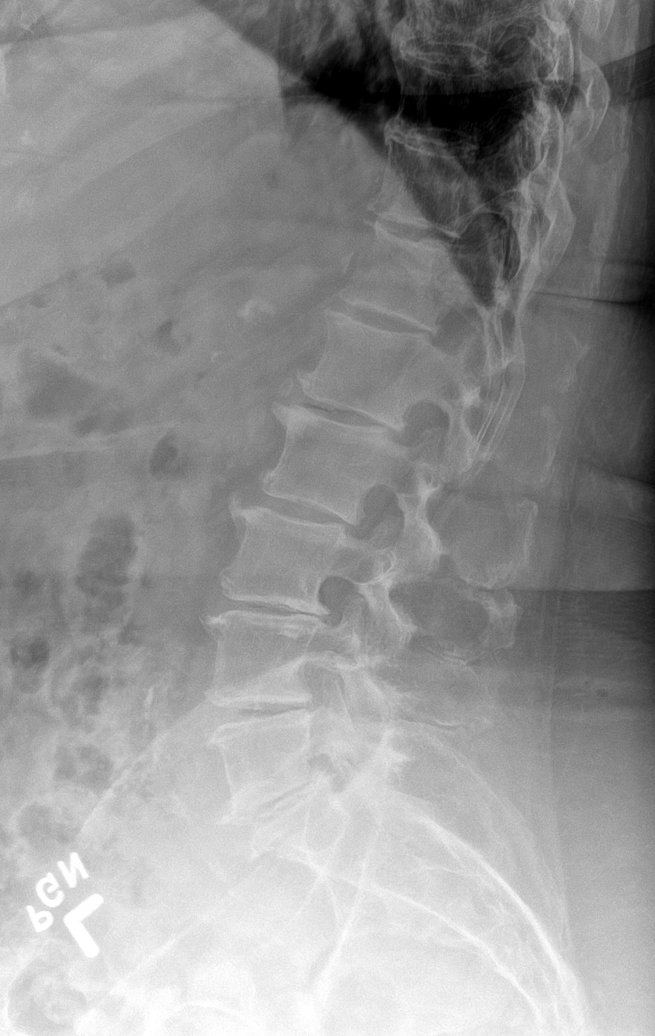

[l-spine spot]
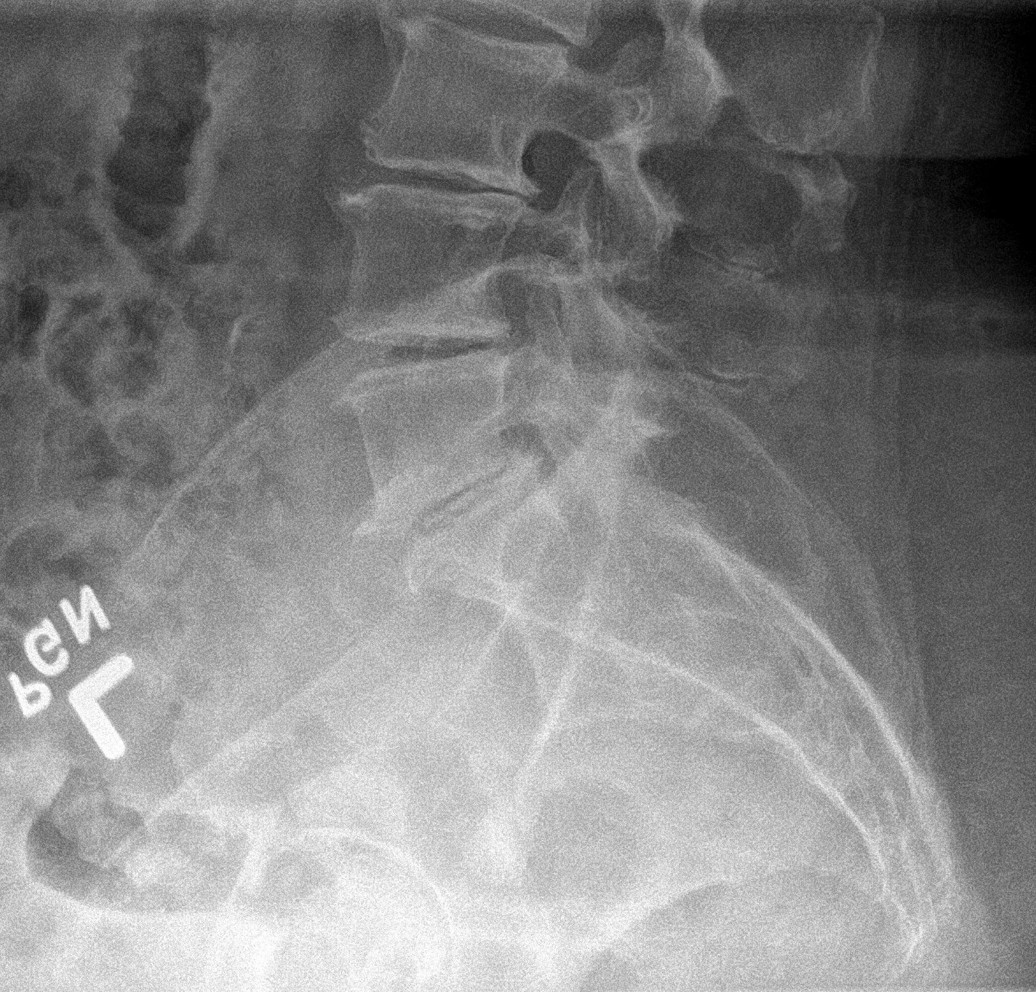

[l-spine obl (2 of 2)]
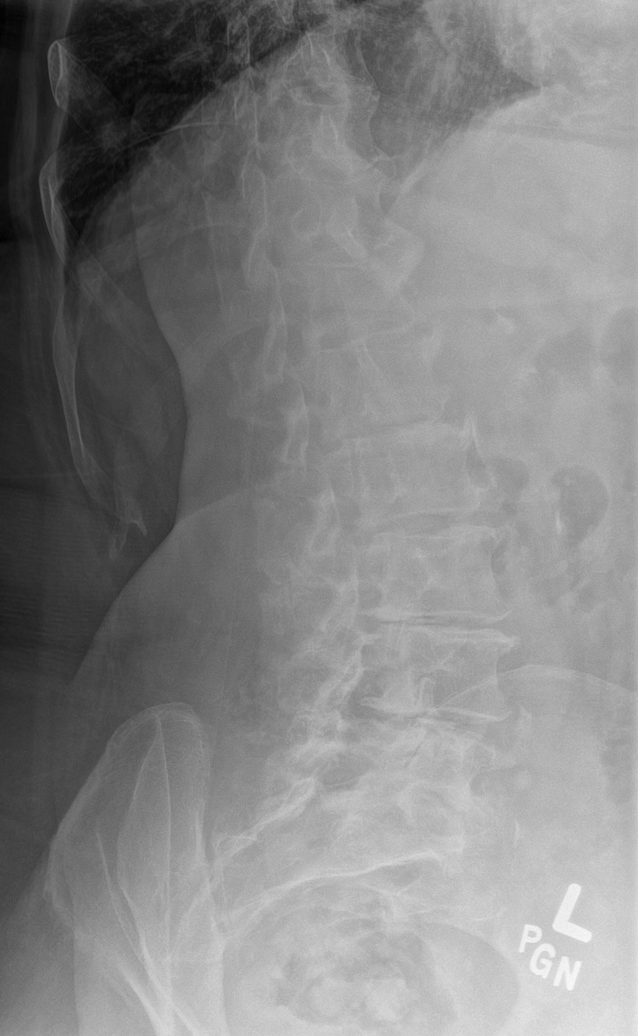

[5 of 5 positions shown; findings below may reference images not displayed]

FINDINGS: Advanced multilevel degenerative changes are noted throughout the
lumbar spine, greatest at the L3-L4, L4-L5, and L5-S1 levels. There
is no acute compression fracture. No significant malalignment.
Advanced facet arthrosis is noted in the lower lumbar segments.
Aortic calcifications are noted.
IMPRESSION: Advanced multilevel degenerative changes of the lumbar spine,
greatest at the L3-L4, L4-L5, and L5-S1 levels.

## 2021-09-28 NOTE — Assessment & Plan Note (Signed)
Exam c/w likely uti, for cephalexin asd, urine studies,  to f/u any worsening symptoms or concerns

## 2021-09-28 NOTE — Assessment & Plan Note (Signed)
C/w msk strain, for flexeril prn,  to f/u any worsening symptoms or concerns

## 2021-09-28 NOTE — Assessment & Plan Note (Signed)
BP Readings from Last 3 Encounters:  09/26/21 122/76  07/05/21 122/72  04/19/21 126/72   Stable, pt to continue medical treatment lotensin, ziac

## 2021-09-29 LAB — URINE CULTURE

## 2021-10-09 ENCOUNTER — Ambulatory Visit: Payer: Medicare Other | Attending: Surgery

## 2021-10-09 VITALS — Wt 230.0 lb

## 2021-10-09 DIAGNOSIS — Z483 Aftercare following surgery for neoplasm: Secondary | ICD-10-CM | POA: Insufficient documentation

## 2021-10-09 NOTE — Therapy (Signed)
OUTPATIENT PHYSICAL THERAPY [facility] SCREENING NOTE   Patient Name: Kim Fuller MRN: [id number] DOB:11-17-1945, 76 y.o., female Today's Date: 10/09/2021  PCP: Hoyt Koch, MD REFERRING PROVIDER: Hoyt Koch, *   PT End of Session - 10/09/21 1517     Visit Number 2   # unchanged due to screen only   PT Start Time 73    PT Stop Time 1519    PT Time Calculation (min) 4 min    Activity Tolerance Patient tolerated treatment well    Behavior During Therapy WFL for tasks assessed/performed             Past Medical History:  Diagnosis Date   Abnormal finding on Pap smear    x1   Allergy    Arthritis    severe in back and left hip   Asthma    Cancer ([place])    breast cancer (left), chemotherapy and radiation   Cataract    had surgery   Diverticulosis    GERD (gastroesophageal reflux disease)    Hyperlipidemia    Hypertension    Mitral valve prolapse    Osteopenia    -1.5 @ femoral neck 11/2007   Reactive airway disease    years ago ; severe asthma attack "bornchitis asthma " per patient, no issues since    Past Surgical History:  Procedure Laterality Date   BREAST LUMPECTOMY WITH RADIOACTIVE SEED AND SENTINEL LYMPH NODE BIOPSY Left 09/16/2019   Procedure: LEFT BREAST LUMPECTOMY WITH RADIOACTIVE SEED AND SENTINEL LYMPH NODE BIOPSY;  Surgeon: Donnie Mesa, MD;  Location: [facility];  Service: General;  Laterality: Left;   CARDIOVASCULAR STRESS TEST  03/05/06   CARPAL TUNNEL RELEASE  2009    bilaterally   CATARACT EXTRACTION, BILATERAL     Dr Gershon Crane   COLONOSCOPY W/ POLYPECTOMY  1998   Tics @ 2004 & 2012; Dr Carlean Purl   HAND SURGERY     Trigger thumb   PARTIAL KNEE ARTHROPLASTY Left 04/03/2017   Procedure: Left knee medial unicompartmental arthroplasty;  Surgeon: Gaynelle Arabian, MD;  Location[facility];  Service: Orthopedics;  Laterality: Left;  Adductor canal block   PORTACATH PLACEMENT N/A 09/29/2019   Procedure: INSERTION  PORT-A-CATH;  Surgeon: Donnie Mesa, MD;  Location: [facility];  Service: General;  Laterality: N/A;   TRIGGER FINGER RELEASE Right    TUBAL LIGATION     Patient Active Problem List   Diagnosis Date Noted   Urinary frequency 09/28/2021   Thrush, oral 05/02/2021   COVID-19 04/24/2021   Trigger finger, left middle finger 10/05/2020   Acquired trigger finger of left ring finger 10/05/2020   Trigger finger, right ring finger 08/26/2020   Port-A-Cath in place 10/27/2019   Malignant neoplasm of upper-outer quadrant of left breast in female, estrogen receptor negative ([place]) 09/08/2019   Acute left-sided low back pain without sciatica 07/06/2019   Morbid obesity ([place]) 04/24/2019   Trigger finger, right middle finger 07/02/2017   Chronic shoulder bursitis, left 11/19/2016   OA (osteoarthritis) of knee 11/18/2015   Routine general medical examination at a health care facility 02/20/2015   GERD 12/31/2007   Osteopenia 10/31/2007   HYPERLIPIDEMIA 03/20/2007   Essential hypertension 03/20/2007    REFERRING DIAG: left breast cancer at risk for lymphedema  THERAPY DIAG:  Aftercare following surgery for neoplasm  PERTINENT HISTORY: Patient was diagnosed on 08/19/2019 with left triple negative invasive ductal carcinoma breast cancer. It is grade III with a Ki67 of 50%. Patient  reports she underwent a left lumpectomy and sentinel node biopsy (4 negative nodes) on 09/16/2019. She had a partial left knee replacement in 2018 and some chronic low back pain.   PRECAUTIONS: left UE Lymphedema risk, None  SUBJECTIVE: Pt returns for her 3 month L-Dex screen. "I moved a heavy desk in my house this morning by myself."  PAIN:  Are you having pain? No  SOZO SCREENING: Patient was assessed today using the SOZO machine to determine the lymphedema index score. This was compared to her baseline score. It was determined that she is within the recommended range when compared to her baseline and no further action is  needed at this time. She will continue SOZO screenings. These are done every 3 months for 2 years post operatively followed by every 6 months for 2 years, and then annually. (Pt was due to begin 6 month screening but her change from baseline was much higher than usual so will continue with at least 1 more 3 month screen and pt was agreeable to this.)    Otelia Limes, PTA 10/09/2021, 3:20 PM

## 2021-10-16 ENCOUNTER — Ambulatory Visit: Payer: Medicare Other | Admitting: Hematology and Oncology

## 2021-10-17 ENCOUNTER — Other Ambulatory Visit: Payer: Self-pay | Admitting: Internal Medicine

## 2021-11-09 ENCOUNTER — Telehealth: Payer: Self-pay | Admitting: Hematology and Oncology

## 2021-11-09 ENCOUNTER — Other Ambulatory Visit: Payer: Self-pay

## 2021-11-09 ENCOUNTER — Inpatient Hospital Stay: Payer: Medicare Other | Attending: Hematology and Oncology | Admitting: Hematology and Oncology

## 2021-11-09 DIAGNOSIS — Z79899 Other long term (current) drug therapy: Secondary | ICD-10-CM | POA: Diagnosis not present

## 2021-11-09 DIAGNOSIS — Z171 Estrogen receptor negative status [ER-]: Secondary | ICD-10-CM | POA: Diagnosis not present

## 2021-11-09 DIAGNOSIS — Z9221 Personal history of antineoplastic chemotherapy: Secondary | ICD-10-CM | POA: Insufficient documentation

## 2021-11-09 DIAGNOSIS — C50412 Malignant neoplasm of upper-outer quadrant of left female breast: Secondary | ICD-10-CM | POA: Insufficient documentation

## 2021-11-09 DIAGNOSIS — Z923 Personal history of irradiation: Secondary | ICD-10-CM | POA: Diagnosis not present

## 2021-11-09 DIAGNOSIS — M1990 Unspecified osteoarthritis, unspecified site: Secondary | ICD-10-CM | POA: Insufficient documentation

## 2021-11-09 NOTE — Progress Notes (Signed)
Patient Care Team: Hoyt Koch, MD as PCP - General (Internal Medicine) Mauro Kaufmann, RN as Oncology Nurse Navigator Rockwell Germany, RN as Oncology Nurse Navigator Donnie Mesa, MD as Consulting Physician (General Surgery) Nicholas Lose, MD as Consulting Physician (Hematology and Oncology) Kyung Rudd, MD as Consulting Physician (Radiation Oncology) Rutherford Guys, MD as Consulting Physician (Ophthalmology) Delice Bison Darnelle Maffucci, [facility] (Inactive) (Pharmacist)  DIAGNOSIS:  Encounter Diagnosis  Name Primary?   Malignant neoplasm of upper-outer quadrant of left breast in female, estrogen receptor negative ([place])     SUMMARY OF ONCOLOGIC HISTORY: Oncology History  Malignant neoplasm of upper-outer quadrant of left breast in female, estrogen receptor negative ([place])  09/08/2019 Initial Diagnosis   Screening mammogram detected a left breast asymmetry. Diagnostic mammogram and US showed a 0.5cm mass, 2 o'clock position, with no axillary adenopathy. Biopsy showed IDC, grade 3, HER-2 - (0), ER/PR -, Ki67 50%.   09/09/2019 Cancer Staging   Staging form: Breast, AJCC 8th Edition - Clinical stage from 09/09/2019: Stage IB (cT1a, cN0, cM0, G3, ER-, PR-, HER2-) - Signed by Nicholas Lose, MD on 09/09/2019   09/16/2019 Surgery   Left lumpectomy (Tsuei): IDC, grade 3, 0.9cm, with high grade DCIS, 4 left axillary lymph nodes negative.   09/16/2019 Cancer Staging   Staging form: Breast, AJCC 8th Edition - Pathologic stage from 09/16/2019: Stage IB (pT1b, pN0, cM0, G3, ER-, PR-, HER2-) - Signed by Gardenia Phlegm, NP on 10/07/2019   10/15/2019 - 01/29/2020 Adjuvant Chemotherapy   CMF x 6   02/26/2020 - 03/23/2020 Radiation Therapy   Adjuvant left breast radiation     CHIEF COMPLIANT:  Follow-up of left breast cancer surveillance    INTERVAL HISTORY: Kim Fuller is a 76 y.o. with above-mentioned history of  triple negative left breast cancer. She presents to the clinic today for a  follow-up. She states that she is still having back trouble. She has some concerns about the back of her legs but denies pain.. Denies pain and discomfort in breast. She cant walk or stand or sit too long because it hurts her back.   ALLERGIES:  is allergic to sulfonamide derivatives and tramadol hcl.  MEDICATIONS:  Current Outpatient Medications  Medication Sig Dispense Refill   acetaminophen (TYLENOL) 500 MG tablet Take 500-1,000 mg by mouth every 6 (six) hours as needed (for pain.).     aspirin 81 MG chewable tablet Chew 81 mg by mouth at bedtime.      benazepril (LOTENSIN) 40 MG tablet Take 1/2 (one-half) tablet by mouth once daily 45 tablet 0   bisoprolol-hydrochlorothiazide (ZIAC) 5-6.25 MG tablet Take 1 tablet by mouth once daily 90 tablet 0   calcium carbonate (OSCAL) 1500 (600 Ca) MG TABS tablet Take 600 mg of elemental calcium by mouth 2 (two) times daily with a meal.     cetirizine (ZYRTEC) 10 MG tablet Take 10 mg by mouth as needed for allergies.     cholecalciferol (VITAMIN D3) 10 MCG (400 UNIT) TABS tablet Take 400 Units by mouth at bedtime.      cyclobenzaprine (FLEXERIL) 5 MG tablet Take 1 tablet (5 mg total) by mouth 3 (three) times daily as needed for muscle spasms. 40 tablet 1   dicyclomine (BENTYL) 10 MG capsule Take 1 capsule (10 mg total) by mouth 3 (three) times daily as needed for spasms. 30 capsule 0   esomeprazole (NEXIUM) 20 MG capsule Take 20 mg by mouth daily as needed (acid reflux/indigestion.).  fluticasone (FLONASE) 50 MCG/ACT nasal spray USE TWO SPRAY(S) IN EACH NOSTRIL DAILY 48 g 6   ibuprofen (ADVIL) 200 MG tablet Take 400 mg by mouth every 8 (eight) hours as needed (pain.).      Multiple Vitamin (MULTIVITAMIN WITH MINERALS) TABS tablet Take 1 tablet by mouth daily. Centrum Silver     Omega-3 Fatty Acids (FISH OIL) 1000 MG CAPS Take 1,000 mg by mouth 2 (two) times daily.     pravastatin (PRAVACHOL) 20 MG tablet Take 1 tablet by mouth once daily 90 tablet 3    Propylene Glycol 0.6 % SOLN Place 1 drop 3 (three) times daily as needed into both eyes (dry eyes).     vitamin E 400 UNIT capsule Take 400 Units by mouth daily.     No current facility-administered medications for this visit.    PHYSICAL EXAMINATION: ECOG PERFORMANCE STATUS: 1 - Symptomatic but completely ambulatory  Vitals:   11/09/21 0958  BP: (!) 155/58  Pulse: (!) 57  Resp: 17  Temp: 97.7 F (36.5 C)  SpO2: 96%   Filed Weights   11/09/21 0958  Weight: 217 lb 12.8 oz (98.8 kg)      LABORATORY DATA:  I have reviewed the data as listed    Latest Ref Rng & Units 04/19/2021   10:54 AM 10/20/2020    9:16 AM 01/29/2020   11:46 AM  CMP  Glucose 70 - 99 mg/dL 84  92  95   BUN 6 - 23 mg/dL _0 Creatinine 0.40 - 1.20 mg/dL 0.73  0.88  0.72   Sodium 135 - 145 mEq/L 140  139  139   Potassium 3.5 - 5.1 mEq/L 4.1  4.1  3.7   Chloride 96 - 112 mEq/L 100  102  105   CO2 19 - 32 mEq/L 34  30  29   Calcium 8.4 - 10.5 mg/dL 10.0  9.4  9.1   Total Protein 6.0 - 8.3 g/dL 7.5  7.2  7.2   Total Bilirubin 0.2 - 1.2 mg/dL 0.7  0.8  0.5   Alkaline Phos 39 - 117 U/L 53  50  63   AST 0 - 37 U/L _1 ALT 0 - 35 U/L _2 Lab Results  Component Value Date   WBC 5.8 04/19/2021   HGB 13.9 04/19/2021   HCT 41.2 04/19/2021   MCV 100.0 04/19/2021   PLT 166.0 04/19/2021   NEUTROABS 3.5 10/20/2020    ASSESSMENT & PLAN:  Malignant neoplasm of upper-outer quadrant of left breast in female, estrogen receptor negative ([place]) 09/16/2018: Left lumpectomy (Tsuei): IDC, grade 3, 0.9cm, with high grade DCIS, 4 left axillary lymph nodes negative.  Triple negative with a Ki-67 of 50%   Treatment plan: 1.  Adjuvant chemotherapy with CMF x6 completed 01/29/2020 2.  Adjuvant radiation therapy  02/26/2020-03/23/2020 ------------------------------------------------------------------------------------------------------------------------------------------------------------ Current Treatment: Surveillance Mammograms  08/25/2021: Benign breast density category B Breast exam 11/09/2021: Benign   CT chest abdomen pelvis in 10/20/2020: No evidence of Met disease, hepatic and renal cysts, bilateral kidney stones, diverticulosis Thyroid nodule noted on CT scan: Thyroid ultrasound: Benign  Severe chronic osteoarthritis   Return to clinic in December 2020 for for follow-up.  She sees surgery in June of every year.   No orders of the defined types were placed in this encounter.  The patient has a good understanding of the overall plan. she  agrees with it. she will call with any problems that may develop before the next visit here. Total time spent: 30 mins including face to face time and time spent for planning, charting and co-ordination of care   Harriette Ohara, MD 11/09/21    I Gardiner Coins am scribing for Dr. Lindi Adie  I have reviewed the above documentation for accuracy and completeness, and I agree with the above.

## 2021-11-09 NOTE — Telephone Encounter (Signed)
Unable to schedule FU appt (per 7/6 los) at this time due to the template not bring released for 2025. Patient states she will call in 6 months to be scheduled for 1 year.

## 2021-11-09 NOTE — Assessment & Plan Note (Addendum)
09/16/2018:Left lumpectomy (Tsuei): IDC, grade 3, 0.9cm, with high grade DCIS, 4 left axillary lymph nodes negative.Triple negative with a Ki-67 of 50%  Treatment plan: 1.Adjuvant chemotherapy with CMFx6 completed 01/29/2020 2.Adjuvant radiation therapy10/22/2021-03/23/2020 ------------------------------------------------------------------------------------------------------------------------------------------------------------ Current Treatment:Surveillance Mammograms  08/25/2021: Benign breast density category B Breast exam 11/09/2021: Benign  CT chest abdomen pelvis in 10/20/2020: No evidence of Met disease, hepatic and renal cysts, bilateral kidney stones, diverticulosis Thyroid nodule noted on CT scan: Thyroid ultrasound: Benign  Severe chronic osteoarthritis  Return to clinic in December 2020 for for follow-up.  She sees surgery in June of every year.

## 2021-11-13 ENCOUNTER — Telehealth: Payer: Self-pay | Admitting: Hematology and Oncology

## 2021-11-13 NOTE — Telephone Encounter (Signed)
Scheduled appointment per 7/6 los. Patient is aware.

## 2021-12-22 ENCOUNTER — Other Ambulatory Visit: Payer: Self-pay | Admitting: Internal Medicine

## 2021-12-26 ENCOUNTER — Other Ambulatory Visit: Payer: Self-pay | Admitting: Internal Medicine

## 2021-12-26 MED ORDER — BENAZEPRIL HCL 40 MG PO TABS: 40.0000 mg | ORAL_TABLET | Freq: Every day | ORAL | 0 refills | Status: DC

## 2022-01-15 ENCOUNTER — Other Ambulatory Visit: Payer: Self-pay | Admitting: Internal Medicine

## 2022-01-16 ENCOUNTER — Other Ambulatory Visit: Payer: Self-pay | Admitting: Internal Medicine

## 2022-01-17 ENCOUNTER — Other Ambulatory Visit: Payer: Self-pay | Admitting: Internal Medicine

## 2022-01-18 ENCOUNTER — Other Ambulatory Visit: Payer: Self-pay | Admitting: Internal Medicine

## 2022-01-29 ENCOUNTER — Ambulatory Visit: Payer: Medicare Other | Attending: Surgery

## 2022-01-29 VITALS — Wt 203.1 lb

## 2022-01-29 DIAGNOSIS — Z483 Aftercare following surgery for neoplasm: Secondary | ICD-10-CM | POA: Insufficient documentation

## 2022-01-29 NOTE — Therapy (Signed)
OUTPATIENT PHYSICAL THERAPY SOZO SCREENING NOTE   Patient Name: Kim Fuller MRN: [id number] DOB:July 09, 1945, 76 y.o., female Today's Date: 01/29/2022  PCP: Hoyt Koch, MD REFERRING PROVIDER: Donnie Mesa, MD   PT End of Session - 01/29/22 1512     Visit Number 2   # unchange ddue to screen only   PT Start Time 2633    PT Stop Time 1515    PT Time Calculation (min) 4 min    Activity Tolerance Patient tolerated treatment well    Behavior During Therapy WFL for tasks assessed/performed             Past Medical History:  Diagnosis Date   Abnormal finding on Pap smear    x1   Allergy    Arthritis    severe in back and left hip   Asthma    Cancer ([place])    breast cancer (left), chemotherapy and radiation   Cataract    had surgery   Diverticulosis    GERD (gastroesophageal reflux disease)    Hyperlipidemia    Hypertension    Mitral valve prolapse    Osteopenia    -1.5 @ femoral neck 11/2007   Reactive airway disease    years ago ; severe asthma attack "bornchitis asthma " per patient, no issues since    Past Surgical History:  Procedure Laterality Date   BREAST LUMPECTOMY WITH RADIOACTIVE SEED AND SENTINEL LYMPH NODE BIOPSY Left 09/16/2019   Procedure: LEFT BREAST LUMPECTOMY WITH RADIOACTIVE SEED AND SENTINEL LYMPH NODE BIOPSY;  Surgeon: Donnie Mesa, MD;  Location: [place];  Service: General;  Laterality: Left;   CARDIOVASCULAR STRESS TEST  03/05/06   CARPAL TUNNEL RELEASE  2009    bilaterally   CATARACT EXTRACTION, BILATERAL     Dr Gershon Crane   COLONOSCOPY W/ POLYPECTOMY  1998   Tics @ 2004 & 2012; Dr Carlean Purl   HAND SURGERY     Trigger thumb   PARTIAL KNEE ARTHROPLASTY Left 04/03/2017   Procedure: Left knee medial unicompartmental arthroplasty;  Surgeon: Gaynelle Arabian, MD;  Location: [facility];  Service: Orthopedics;  Laterality: Left;  Adductor canal block   PORTACATH PLACEMENT N/A 09/29/2019   Procedure: INSERTION PORT-A-CATH;   Surgeon: Donnie Mesa, MD;  Location: [place];  Service: General;  Laterality: N/A;   TRIGGER FINGER RELEASE Right    TUBAL LIGATION     Patient Active Problem List   Diagnosis Date Noted   Urinary frequency 09/28/2021   Thrush, oral 05/02/2021   COVID-19 04/24/2021   Trigger finger, left middle finger 10/05/2020   Acquired trigger finger of left ring finger 10/05/2020   Trigger finger, right ring finger 08/26/2020   Port-A-Cath in place 10/27/2019   Malignant neoplasm of upper-outer quadrant of left breast in female, estrogen receptor negative ([place]) 09/08/2019   Acute left-sided low back pain without sciatica 07/06/2019   Morbid obesity ([place]) 04/24/2019   Trigger finger, right middle finger 07/02/2017   Chronic shoulder bursitis, left 11/19/2016   OA (osteoarthritis) of knee 11/18/2015   Routine general medical examination at a health care facility 02/20/2015   GERD 12/31/2007   Osteopenia 10/31/2007   HYPERLIPIDEMIA 03/20/2007   Essential hypertension 03/20/2007    REFERRING DIAG: left breast cancer at risk for lymphedema  THERAPY DIAG:  Aftercare following surgery for neoplasm  PERTINENT HISTORY: Patient was diagnosed on 08/19/2019 with left triple negative invasive ductal carcinoma breast cancer. It is grade III with a Ki67 of 50%. Patient reports  she underwent a left lumpectomy and sentinel node biopsy (4 negative nodes) on 09/16/2019. She had a partial left knee replacement in 2018 and some chronic low back pain.   PRECAUTIONS: left UE Lymphedema risk, None  SUBJECTIVE: Pt returns for her 3 month L-Dex screen. "I moved a heavy desk in my house this morning by myself."  PAIN:  Are you having pain? No  SOZO SCREENING: Patient was assessed today using the SOZO machine to determine the lymphedema index score. This was compared to her baseline score. It was determined that she is within the recommended range when compared to her baseline and no further action is needed at  this time. She will continue SOZO screenings. These are done every 3 months for 2 years post operatively followed by every 6 months for 2 years, and then annually. (Pt was due to begin 6 month screening but her change from baseline was much higher than usual so will continue with at least 1 more 3 month screen and pt was agreeable to this.)   L-DEX FLOWSHEETS - 01/29/22 1500       L-DEX LYMPHEDEMA SCREENING   Measurement Type Unilateral    L-DEX MEASUREMENT EXTREMITY Upper Extremity    POSITION  Standing    DOMINANT SIDE Right    At Risk Side Left    BASELINE SCORE (UNILATERAL) 6    L-DEX SCORE (UNILATERAL) 9.4    VALUE CHANGE (UNILAT) 3.4              Otelia Limes, PTA 01/29/2022, 3:14 PM

## 2022-02-07 DIAGNOSIS — Z23 Encounter for immunization: Secondary | ICD-10-CM | POA: Diagnosis not present

## 2022-02-08 NOTE — Progress Notes (Signed)
[facility] [place] Phone: [phone] Subjective:   [name], am serving as a scribe for Dr. [name].  I'm seeing this patient by the request  of:  Hoyt Koch, MD  CC: Right hand pain  DEY:[id number]  07/05/2021 Patient given an injection.  Tolerated the procedure well, patient knows that if this continues to give difficulty surgical intervention may be necessary but I do hope patient will continue to respond..  Increase activity slowly.  Follow-up again in12 weeks  Patient mated nearly a year at this time.  Discussed icing regimen and home exercises.  Encourage patient to monitor and use the bracing at night.  Discussed which activities to do and which ones to avoid.  Increase activity slowly.  Follow-up again in 6 to 8 weeks.  Updated 14/01/7025 Kim Fuller is a 76 y.o. female coming in with complaint of right hand ring finger pain. Using brace to help keep finger extended but triggering began 2 weeks ago. Left hand is no longer triggering post injection.        Past Medical History:  Diagnosis Date   Abnormal finding on Pap smear    x1   Allergy    Arthritis    severe in back and left hip   Asthma    Cancer ([place])    breast cancer (left), chemotherapy and radiation   Cataract    had surgery   Diverticulosis    GERD (gastroesophageal reflux disease)    Hyperlipidemia    Hypertension    Mitral valve prolapse    Osteopenia    -1.5 @ femoral neck 11/2007   Reactive airway disease    years ago ; severe asthma attack "bornchitis asthma " per patient, no issues since    Past Surgical History:  Procedure Laterality Date   BREAST LUMPECTOMY WITH RADIOACTIVE SEED AND SENTINEL LYMPH NODE BIOPSY Left 09/16/2019   Procedure: LEFT BREAST LUMPECTOMY WITH RADIOACTIVE SEED AND SENTINEL LYMPH NODE BIOPSY;  Surgeon: Donnie Mesa, MD;  Location: [place];  Service: General;   Laterality: Left;   CARDIOVASCULAR STRESS TEST  03/05/06   CARPAL TUNNEL RELEASE  2009    bilaterally   CATARACT EXTRACTION, BILATERAL     Dr Gershon Crane   COLONOSCOPY W/ POLYPECTOMY  1998   Tics @ 2004 & 2012; Dr Carlean Purl   HAND SURGERY     Trigger thumb   PARTIAL KNEE ARTHROPLASTY Left 04/03/2017   Procedure: Left knee medial unicompartmental arthroplasty;  Surgeon: Gaynelle Arabian, MD;  Location: [facility];  Service: Orthopedics;  Laterality: Left;  Adductor canal block   PORTACATH PLACEMENT N/A 09/29/2019   Procedure: INSERTION PORT-A-CATH;  Surgeon: Donnie Mesa, MD;  Location: [place];  Service: General;  Laterality: N/A;   TRIGGER FINGER RELEASE Right    TUBAL LIGATION     Social History   Socioeconomic History   Marital status: Married    Spouse name: Not on file   Number of children: 2   Years of education: Not on file   Highest education level: Not on file  Occupational History   Occupation: retired  Tobacco Use   Smoking status: Former    Types: Cigarettes    Quit date: 05/07/1970    Years since quitting: 51.7   Smokeless tobacco: Never   Tobacco comments:    Smoked as a teen  1962-1972,only up to 3 cigarettes/ day  Vaping Use   Vaping Use:  Never used  Substance and Sexual Activity   Alcohol use: Yes    Comment: occas   Drug use: No   Sexual activity: Not Currently  Other Topics Concern   Not on file  Social History Narrative   Not on file   Social Determinants of Health   Financial Resource Strain: Low Risk  (04/14/2020)   Overall Financial Resource Strain (CARDIA)    Difficulty of Paying Living Expenses: Not hard at all  Food Insecurity: No Food Insecurity (04/14/2020)   Hunger Vital Sign    Worried About Running Out of Food in the Last Year: Never true    Ran Out of Food in the Last Year: Never true  Transportation Needs: No Transportation Needs (04/14/2020)   PRAPARE - Hydrologist (Medical): No    Lack of Transportation  (Non-Medical): No  Physical Activity: Sufficiently Active (04/14/2020)   Exercise Vital Sign    Days of Exercise per Week: 5 days    Minutes of Exercise per Session: 30 min  Stress: No Stress Concern Present (04/14/2020)   [place]    Feeling of Stress : Not at all  Social Connections: [place] (04/14/2020)   Social Connection and Isolation Panel [NHANES]    Frequency of Communication with Friends and Family: More than three times a week    Frequency of Social Gatherings with Friends and Family: Once a week    Attends Religious Services: More than 4 times per year    Active Member of [employer] or Organizations: No    Attends Music therapist: More than 4 times per year    Marital Status: Married   Allergies  Allergen Reactions   Sulfonamide Derivatives Rash    Rash Because of a history of documented adverse serious drug reaction;Medi Alert bracelet  is recommended   Tramadol Hcl Other (See Comments)    REACTION: cold sweats, weak, fatigue   Family History  Problem Relation Age of Onset   Asthma Sister    Hypertension Sister    Hypertension Father    Heart attack Father        MI > 56   Prostate cancer Father    Lung cancer Father    Lung cancer Sister        smoker   Melanoma Sister    Osteoporosis Sister    Glaucoma Sister        X 2   Diverticulosis Sister        2 sisters   Lung cancer Sister    Colon polyps Sister        2 sisters with polyps   Diverticulitis Sister        1 sister   Hypertension Mother    Hypertension Brother    Skin cancer Sister    Diabetes Neg Hx    Stroke Neg Hx    Breast cancer Neg Hx    Colon cancer Neg Hx    Esophageal cancer Neg Hx    Stomach cancer Neg Hx      Current Outpatient Medications (Cardiovascular):    benazepril (LOTENSIN) 40 MG tablet, Take 1 tablet (40 mg total) by mouth daily.   bisoprolol-hydrochlorothiazide (ZIAC) 5-6.25 MG  tablet, Take 1 tablet by mouth once daily   pravastatin (PRAVACHOL) 20 MG tablet, Take 1 tablet by mouth once daily  Current Outpatient Medications (Respiratory):    cetirizine (ZYRTEC) 10 MG tablet, Take 10  mg by mouth as needed for allergies.   fluticasone (FLONASE) 50 MCG/ACT nasal spray, USE TWO SPRAY(S) IN EACH NOSTRIL DAILY  Current Outpatient Medications (Analgesics):    acetaminophen (TYLENOL) 500 MG tablet, Take 500-1,000 mg by mouth every 6 (six) hours as needed (for pain.).   aspirin 81 MG chewable tablet, Chew 81 mg by mouth at bedtime.    ibuprofen (ADVIL) 200 MG tablet, Take 400 mg by mouth every 8 (eight) hours as needed (pain.).    Current Outpatient Medications (Other):    calcium carbonate (OSCAL) 1500 (600 Ca) MG TABS tablet, Take 600 mg of elemental calcium by mouth 2 (two) times daily with a meal.   cholecalciferol (VITAMIN D3) 10 MCG (400 UNIT) TABS tablet, Take 400 Units by mouth at bedtime.    cyclobenzaprine (FLEXERIL) 5 MG tablet, Take 1 tablet (5 mg total) by mouth 3 (three) times daily as needed for muscle spasms.   dicyclomine (BENTYL) 10 MG capsule, Take 1 capsule (10 mg total) by mouth 3 (three) times daily as needed for spasms.   esomeprazole (NEXIUM) 20 MG capsule, Take 20 mg by mouth daily as needed (acid reflux/indigestion.).   Multiple Vitamin (MULTIVITAMIN WITH MINERALS) TABS tablet, Take 1 tablet by mouth daily. Centrum Silver   Omega-3 Fatty Acids (FISH OIL) 1000 MG CAPS, Take 1,000 mg by mouth 2 (two) times daily.   Propylene Glycol 0.6 % SOLN, Place 1 drop 3 (three) times daily as needed into both eyes (dry eyes).   vitamin E 400 UNIT capsule, Take 400 Units by mouth daily.   Reviewed prior external information including notes and imaging from  primary care provider As well as notes that were available from care everywhere and other healthcare systems.  Past medical history, social, surgical and family history all reviewed in electronic medical  record.  No pertanent information unless stated regarding to the chief complaint.   Review of Systems:  No headache, visual changes, nausea, vomiting, diarrhea, constipation, dizziness, abdominal pain, skin rash, fevers, chills, night sweats, weight loss, swollen lymph nodes, body aches, joint swelling, chest pain, shortness of breath, mood changes. POSITIVE muscle aches  Objective  Blood pressure 138/70, pulse 63, height '5\' 1"'$  (1.549 m), weight 203 lb (92.1 kg), SpO2 94 %.   General: No apparent distress alert and oriented x3 mood and affect normal, dressed appropriately.  HEENT: Pupils equal, extraocular movements intact  Respiratory: Patient's speak in full sentences and does not appear short of breath  Cardiovascular: No lower extremity edema, non tender, no erythema  Right hand does have some tenderness to palpation noted.  Triggering noted.  At the A2 pulley and nodule noted.  Right ring finger  Procedure: Real-time Ultrasound Guided Injection of right ring flexor tendon sheath Device: GE Logiq Q7 Ultrasound guided injection is preferred based studies that show increased duration, increased effect, greater accuracy, decreased procedural pain, increased response rate, and decreased cost with ultrasound guided versus blind injection.  Verbal informed consent obtained.  Time-out conducted.  Noted no overlying erythema, induration, or other signs of local infection.  Skin prepped in a sterile fashion.  Local anesthesia: Topical Ethyl chloride.  With sterile technique and under real time ultrasound guidance: With a 25-gauge half until injected with 0.5 cc of 0.5% Marcaine and 0.5 cc of Kenalog 40 mg/mL Completed without difficulty  Pain immediately resolved suggesting accurate placement of the medication.  Advised to call if fevers/chills, erythema, induration, drainage, or persistent bleeding.  Impression: Technically successful ultrasound guided injection.  Impression and  Recommendations:      The above documentation has been reviewed and is accurate and complete Lyndal Pulley, DO

## 2022-02-09 ENCOUNTER — Encounter: Payer: Self-pay | Admitting: Family Medicine

## 2022-02-09 ENCOUNTER — Ambulatory Visit (INDEPENDENT_AMBULATORY_CARE_PROVIDER_SITE_OTHER): Payer: Medicare Other | Admitting: Family Medicine

## 2022-02-09 ENCOUNTER — Ambulatory Visit: Payer: Self-pay

## 2022-02-09 VITALS — BP 138/70 | HR 63 | Ht 61.0 in | Wt 203.0 lb

## 2022-02-09 DIAGNOSIS — M79641 Pain in right hand: Secondary | ICD-10-CM | POA: Diagnosis not present

## 2022-02-09 DIAGNOSIS — M65341 Trigger finger, right ring finger: Secondary | ICD-10-CM | POA: Diagnosis not present

## 2022-02-09 NOTE — Assessment & Plan Note (Signed)
Patient given injection and tolerated the procedure well, discussed with patient about possible bracing, patient has had this in multiple other areas previously.  Chronic problem with exacerbation.  Discussed medications including the cyclobenzaprine up to 3 times daily.  Follow-up again in 6 to 8 weeks.

## 2022-02-09 NOTE — Patient Instructions (Signed)
Injected trigger finger today See me in 2 months for the knee

## 2022-02-22 ENCOUNTER — Telehealth: Payer: Self-pay | Admitting: Internal Medicine

## 2022-02-22 DIAGNOSIS — R001 Bradycardia, unspecified: Secondary | ICD-10-CM | POA: Diagnosis not present

## 2022-02-22 DIAGNOSIS — R0789 Other chest pain: Secondary | ICD-10-CM | POA: Diagnosis not present

## 2022-02-22 DIAGNOSIS — K219 Gastro-esophageal reflux disease without esophagitis: Secondary | ICD-10-CM | POA: Diagnosis not present

## 2022-02-22 NOTE — Telephone Encounter (Signed)
PT calls today in regards to onset of several different symptoms. PT has been experiencing heart racing, naseus, left arm hurting, tightness in chest, and SOB since Sunday/Monday of this week. I was able to get PT scheduled with our soonest available appointment next Monday but also directed them to the Triage Line.

## 2022-02-23 NOTE — Telephone Encounter (Signed)
Appt 03/05/22 w/ dr. Dewitt Hoes f/u

## 2022-02-23 NOTE — Telephone Encounter (Signed)
Patient went to [facility] for symptoms: heart racing, naseus, left arm hurting, tightness in chest, shortness in breath. Patient says they thought it might have been acid reflux. Patient stated that they did a xray, ct scan with contrast and blood work. She stated all results came back normal but that they did put her on Patoprazole

## 2022-02-26 ENCOUNTER — Ambulatory Visit: Payer: Medicare Other | Admitting: Internal Medicine

## 2022-03-05 ENCOUNTER — Ambulatory Visit (INDEPENDENT_AMBULATORY_CARE_PROVIDER_SITE_OTHER): Payer: Medicare Other | Admitting: Internal Medicine

## 2022-03-05 VITALS — BP 138/80 | HR 67 | Temp 98.5°F | Ht 61.0 in | Wt 203.0 lb

## 2022-03-05 DIAGNOSIS — R300 Dysuria: Secondary | ICD-10-CM | POA: Diagnosis not present

## 2022-03-05 DIAGNOSIS — I10 Essential (primary) hypertension: Secondary | ICD-10-CM | POA: Diagnosis not present

## 2022-03-05 DIAGNOSIS — K219 Gastro-esophageal reflux disease without esophagitis: Secondary | ICD-10-CM | POA: Diagnosis not present

## 2022-03-05 LAB — URINALYSIS, ROUTINE W REFLEX MICROSCOPIC
Bilirubin Urine: NEGATIVE
Ketones, ur: NEGATIVE
Nitrite: NEGATIVE
Specific Gravity, Urine: 1.02 (ref 1.000–1.030)
Total Protein, Urine: NEGATIVE
Urine Glucose: NEGATIVE
Urobilinogen, UA: 0.2 (ref 0.0–1.0)
pH: 5.5 (ref 5.0–8.0)

## 2022-03-05 NOTE — Progress Notes (Signed)
   Subjective:   Patient ID: Kim Fuller, female    DOB: 12-Jun-1945, 76 y.o.   MRN: [id number]  HPI The patient is a 76 YO female coming in for ER follow up. No notes to review. She states had chest pains and EKG and labs and x-ray done. No findings. They told her it was GERD and gave her medicine to help with this. This has helped. She is concerned about UTI currently.   Review of Systems  Constitutional: Negative.   HENT: Negative.    Eyes: Negative.   Respiratory:  Negative for cough, chest tightness and shortness of breath.   Cardiovascular:  Negative for chest pain, palpitations and leg swelling.  Gastrointestinal:  Negative for abdominal distention, abdominal pain, constipation, diarrhea, nausea and vomiting.       Bloating/gas  Genitourinary:  Positive for frequency and urgency.  Musculoskeletal: Negative.   Skin: Negative.   Neurological: Negative.   Psychiatric/Behavioral: Negative.      Objective:  Physical Exam Constitutional:      Appearance: She is well-developed.  HENT:     Head: Normocephalic and atraumatic.  Cardiovascular:     Rate and Rhythm: Normal rate and regular rhythm.  Pulmonary:     Effort: Pulmonary effort is normal. No respiratory distress.     Breath sounds: Normal breath sounds. No wheezing or rales.  Abdominal:     General: Bowel sounds are normal. There is no distension.     Palpations: Abdomen is soft.     Tenderness: There is no abdominal tenderness. There is no rebound.  Musculoskeletal:     Cervical back: Normal range of motion.  Skin:    General: Skin is warm and dry.  Neurological:     Mental Status: She is alert and oriented to person, place, and time.     Coordination: Coordination normal.     Vitals:   03/05/22 1350  BP: 138/80  Pulse: 67  Temp: 98.5 F (36.9 C)  TempSrc: Oral  SpO2: 94%  Weight: 203 lb (92.1 kg)  Height: '5\' 1"'$  (1.549 m)    Assessment & Plan:  Visit time 20 minutes in face to face communication  with patient and coordination of care, additional 10 minutes spent in record review, coordination or care, ordering tests, communicating/referring to other healthcare professionals, documenting in medical records all on the same day of the visit for total time 30 minutes spent on the visit.

## 2022-03-06 ENCOUNTER — Encounter: Payer: Self-pay | Admitting: Internal Medicine

## 2022-03-06 DIAGNOSIS — R300 Dysuria: Secondary | ICD-10-CM | POA: Insufficient documentation

## 2022-03-06 LAB — URINE CULTURE

## 2022-03-06 NOTE — Assessment & Plan Note (Signed)
Prior UTI and this feels similar. Checking U/A and culture. Treat as appropriate.

## 2022-03-06 NOTE — Assessment & Plan Note (Signed)
BP at goal on bisoprolol/hctz 5/6.25 mg daily and recent labs at ER stable per her report. Keep current medication.

## 2022-03-06 NOTE — Assessment & Plan Note (Signed)
She has had recent flare. Taking nexium 20 mg daily since ER visit and no current problems. She will take this a few weeks and then stop if able. We talked about food triggers.

## 2022-03-20 DIAGNOSIS — Z23 Encounter for immunization: Secondary | ICD-10-CM | POA: Diagnosis not present

## 2022-04-03 ENCOUNTER — Other Ambulatory Visit: Payer: Self-pay | Admitting: Internal Medicine

## 2022-04-09 NOTE — Progress Notes (Unsigned)
[facility] [place] Phone: [phone] Subjective:   Kim Fuller, am serving as a scribe for Dr. [name].  I'm seeing this patient by the request  of:  Hoyt Koch, MD  CC: Finger pain follow-up  [facility]:[id number]  Kim Fuller is a 76 y.o. female coming in with complaint of L hand pain. Given trigger finger injection at last visit in October. Patient states that her trigger finger middle finger L hand started 2 weeks ago. Would like injection.       Past Medical History:  Diagnosis Date   Abnormal finding on Pap smear    x1   Allergy    Arthritis    severe in back and left hip   Asthma    Cancer ([place])    breast cancer (left), chemotherapy and radiation   Cataract    had surgery   Diverticulosis    GERD (gastroesophageal reflux disease)    Hyperlipidemia    Hypertension    Mitral valve prolapse    Osteopenia    -1.5 @ femoral neck 11/2007   Reactive airway disease    years ago ; severe asthma attack "bornchitis asthma " per patient, Fuller issues since    Past Surgical History:  Procedure Laterality Date   BREAST LUMPECTOMY WITH RADIOACTIVE SEED AND SENTINEL LYMPH NODE BIOPSY Left 09/16/2019   Procedure: LEFT BREAST LUMPECTOMY WITH RADIOACTIVE SEED AND SENTINEL LYMPH NODE BIOPSY;  Surgeon: Donnie Mesa, MD;  Location: [place];  Service: General;  Laterality: Left;   CARDIOVASCULAR STRESS TEST  03/05/06   CARPAL TUNNEL RELEASE  2009    bilaterally   CATARACT EXTRACTION, BILATERAL     Dr Gershon Crane   COLONOSCOPY W/ POLYPECTOMY  1998   Tics @ 2004 & 2012; Dr Carlean Purl   HAND SURGERY     Trigger thumb   PARTIAL KNEE ARTHROPLASTY Left 04/03/2017   Procedure: Left knee medial unicompartmental arthroplasty;  Surgeon: Gaynelle Arabian, MD;  Location: [facility];  Service: Orthopedics;  Laterality: Left;  Adductor canal block   PORTACATH PLACEMENT N/A 09/29/2019   Procedure: INSERTION  PORT-A-CATH;  Surgeon: Donnie Mesa, MD;  Location: [place];  Service: General;  Laterality: N/A;   TRIGGER FINGER RELEASE Right    TUBAL LIGATION     Social History   Socioeconomic History   Marital status: Married    Spouse name: Not on file   Number of children: 2   Years of education: Not on file   Highest education level: Not on file  Occupational History   Occupation: retired  Tobacco Use   Smoking status: Former    Types: Cigarettes    Quit date: 05/07/1970    Years since quitting: 51.9   Smokeless tobacco: Never   Tobacco comments:    Smoked as a teen  1962-1972,only up to 3 cigarettes/ day  Vaping Use   Vaping Use: Never used  Substance and Sexual Activity   Alcohol use: Yes    Comment: occas   Drug use: Fuller   Sexual activity: Not Currently  Other Topics Concern   Not on file  Social History Narrative   Not on file   Social Determinants of Health   Financial Resource Strain: Low Risk  (04/14/2020)   Overall Financial Resource Strain (CARDIA)    Difficulty of Paying Living Expenses: Not hard at all  Food Insecurity: Fuller Food Insecurity (04/14/2020)   Hunger Vital Sign  Worried About Charity fundraiser in the Last Year: Never true    [place] in the Last Year: Never true  Transportation Needs: Fuller Transportation Needs (04/14/2020)   PRAPARE - Hydrologist (Medical): Fuller    Lack of Transportation (Non-Medical): Fuller  Physical Activity: Sufficiently Active (04/14/2020)   Exercise Vital Sign    Days of Exercise per Week: 5 days    Minutes of Exercise per Session: 30 min  Stress: Fuller Stress Concern Present (04/14/2020)   [place]    Feeling of Stress : Not at all  Social Connections: [place] (04/14/2020)   Social Connection and Isolation Panel [NHANES]    Frequency of Communication with Friends and Family: More than three times a week    Frequency of  Social Gatherings with Friends and Family: Once a week    Attends Religious Services: More than 4 times per year    Active Member of [employer] or Organizations: Fuller    Attends Music therapist: More than 4 times per year    Marital Status: Married   Allergies  Allergen Reactions   Sulfonamide Derivatives Rash    Rash Because of a history of documented adverse serious drug reaction;Medi Alert bracelet  is recommended   Tramadol Hcl Other (See Comments)    REACTION: cold sweats, weak, fatigue   Family History  Problem Relation Age of Onset   Asthma Sister    Hypertension Sister    Hypertension Father    Heart attack Father        MI > 28   Prostate cancer Father    Lung cancer Father    Lung cancer Sister        smoker   Melanoma Sister    Osteoporosis Sister    Glaucoma Sister        X 2   Diverticulosis Sister        2 sisters   Lung cancer Sister    Colon polyps Sister        2 sisters with polyps   Diverticulitis Sister        1 sister   Hypertension Mother    Hypertension Brother    Skin cancer Sister    Diabetes Neg Hx    Stroke Neg Hx    Breast cancer Neg Hx    Colon cancer Neg Hx    Esophageal cancer Neg Hx    Stomach cancer Neg Hx      Current Outpatient Medications (Cardiovascular):    benazepril (LOTENSIN) 40 MG tablet, Take 1 tablet by mouth once daily   bisoprolol-hydrochlorothiazide (ZIAC) 5-6.25 MG tablet, Take 1 tablet by mouth once daily   pravastatin (PRAVACHOL) 20 MG tablet, Take 1 tablet by mouth once daily  Current Outpatient Medications (Respiratory):    cetirizine (ZYRTEC) 10 MG tablet, Take 10 mg by mouth as needed for allergies.   fluticasone (FLONASE) 50 MCG/ACT nasal spray, USE TWO SPRAY(S) IN EACH NOSTRIL DAILY  Current Outpatient Medications (Analgesics):    acetaminophen (TYLENOL) 500 MG tablet, Take 500-1,000 mg by mouth every 6 (six) hours as needed (for pain.).   aspirin 81 MG chewable tablet, Chew 81 mg by mouth at  bedtime.    ibuprofen (ADVIL) 200 MG tablet, Take 400 mg by mouth every 8 (eight) hours as needed (pain.).    Current Outpatient Medications (Other):    calcium carbonate (OSCAL) 1500 (600  Ca) MG TABS tablet, Take 600 mg of elemental calcium by mouth 2 (two) times daily with a meal.   cholecalciferol (VITAMIN D3) 10 MCG (400 UNIT) TABS tablet, Take 400 Units by mouth at bedtime.    cyclobenzaprine (FLEXERIL) 5 MG tablet, Take 1 tablet (5 mg total) by mouth 3 (three) times daily as needed for muscle spasms.   dicyclomine (BENTYL) 10 MG capsule, Take 1 capsule (10 mg total) by mouth 3 (three) times daily as needed for spasms.   esomeprazole (NEXIUM) 20 MG capsule, Take 20 mg by mouth daily as needed (acid reflux/indigestion.).   Multiple Vitamin (MULTIVITAMIN WITH MINERALS) TABS tablet, Take 1 tablet by mouth daily. Centrum Silver   Omega-3 Fatty Acids (FISH OIL) 1000 MG CAPS, Take 1,000 mg by mouth 2 (two) times daily.   pantoprazole (PROTONIX) 40 MG tablet, Take 40 mg by mouth daily.   Propylene Glycol 0.6 % SOLN, Place 1 drop 3 (three) times daily as needed into both eyes (dry eyes).   vitamin E 400 UNIT capsule, Take 400 Units by mouth daily.   Reviewed prior external information including notes and imaging from  primary care provider As well as notes that were available from care everywhere and other healthcare systems.  Past medical history, social, surgical and family history all reviewed in electronic medical record.  Fuller pertanent information unless stated regarding to the chief complaint.   Review of Systems:  Fuller headache, visual changes, nausea, vomiting, diarrhea, constipation, dizziness, abdominal pain, skin rash, fevers, chills, night sweats, weight loss, swollen lymph nodes, body aches, joint swelling, chest pain, shortness of breath, mood changes. POSITIVE muscle aches  Objective  Blood pressure 122/70, pulse (!) 56, height '5\' 1"'$  (1.549 m), weight 202 lb (91.6 kg), SpO2 98  %.   General: Fuller apparent distress alert and oriented x3 mood and affect normal, dressed appropriately.  HEENT: Pupils equal, extraocular movements intact  Respiratory: Patient's speak in full sentences and does not appear short of breath  Cardiovascular: Fuller lower extremity edema, non tender, Fuller erythema  Left hand exam shows the patient does have a trigger nodule at the A2 pulley of the middle finger.  Tender to palpation in this area.  He does have triggering noted today.  Procedure: Real-time Ultrasound Guided Injection of left flexor tendon sheath third finger Device: GE Logiq Q7 Ultrasound guided injection is preferred based studies that show increased duration, increased effect, greater accuracy, decreased procedural pain, increased response rate, and decreased cost with ultrasound guided versus blind injection.  Verbal informed consent obtained.  Time-out conducted.  Noted Fuller overlying erythema, induration, or other signs of local infection.  Skin prepped in a sterile fashion.  Local anesthesia: Topical Ethyl chloride.  With sterile technique and under real time ultrasound guidance: With a 25-gauge half inch needle injected with 0.5 cc of 0.5% Marcaine and 0.5 cc of Kenalog 40 mg/mL Completed without difficulty  Pain immediately resolved suggesting accurate placement of the medication.  Advised to call if fevers/chills, erythema, induration, drainage, or persistent bleeding.  Impression: Technically successful ultrasound guided injection.    Impression and Recommendations:    The above documentation has been reviewed and is accurate and complete Lyndal Pulley, DO

## 2022-04-11 ENCOUNTER — Ambulatory Visit: Payer: Self-pay

## 2022-04-11 ENCOUNTER — Ambulatory Visit (INDEPENDENT_AMBULATORY_CARE_PROVIDER_SITE_OTHER): Payer: Medicare Other | Admitting: Family Medicine

## 2022-04-11 ENCOUNTER — Encounter: Payer: Self-pay | Admitting: Family Medicine

## 2022-04-11 VITALS — BP 122/70 | HR 56 | Ht 61.0 in | Wt 202.0 lb

## 2022-04-11 DIAGNOSIS — M65332 Trigger finger, left middle finger: Secondary | ICD-10-CM

## 2022-04-11 DIAGNOSIS — M79642 Pain in left hand: Secondary | ICD-10-CM

## 2022-04-11 NOTE — Assessment & Plan Note (Signed)
Patient has done relatively well with these trigger finger injection patient still would like to avoid any type of surgical intervention but does seem to be worsening secondary to the frequency of these injections.  We discussed with patient icing regimen and home exercises, which activities to do and which ones to avoid.  We discussed bracing at night.  Follow-up again in 6 to 8 weeks

## 2022-04-11 NOTE — Patient Instructions (Addendum)
Injected finger today See me in 6-8 weeks otherwise see me when you need me

## 2022-04-18 ENCOUNTER — Encounter: Payer: Medicare Other | Admitting: Internal Medicine

## 2022-04-18 DIAGNOSIS — Z96652 Presence of left artificial knee joint: Secondary | ICD-10-CM | POA: Diagnosis not present

## 2022-04-20 ENCOUNTER — Encounter: Payer: Medicare Other | Admitting: Internal Medicine

## 2022-04-20 ENCOUNTER — Encounter: Payer: Self-pay | Admitting: Internal Medicine

## 2022-04-20 ENCOUNTER — Ambulatory Visit (INDEPENDENT_AMBULATORY_CARE_PROVIDER_SITE_OTHER): Payer: Medicare Other | Admitting: Internal Medicine

## 2022-04-20 VITALS — BP 120/80 | HR 47 | Temp 98.2°F | Ht 61.0 in | Wt 203.0 lb

## 2022-04-20 DIAGNOSIS — K219 Gastro-esophageal reflux disease without esophagitis: Secondary | ICD-10-CM

## 2022-04-20 DIAGNOSIS — M8580 Other specified disorders of bone density and structure, unspecified site: Secondary | ICD-10-CM | POA: Diagnosis not present

## 2022-04-20 DIAGNOSIS — Z853 Personal history of malignant neoplasm of breast: Secondary | ICD-10-CM | POA: Diagnosis not present

## 2022-04-20 DIAGNOSIS — R7301 Impaired fasting glucose: Secondary | ICD-10-CM | POA: Diagnosis not present

## 2022-04-20 DIAGNOSIS — Z0000 Encounter for general adult medical examination without abnormal findings: Secondary | ICD-10-CM

## 2022-04-20 DIAGNOSIS — E782 Mixed hyperlipidemia: Secondary | ICD-10-CM

## 2022-04-20 DIAGNOSIS — I10 Essential (primary) hypertension: Secondary | ICD-10-CM | POA: Diagnosis not present

## 2022-04-20 LAB — COMPREHENSIVE METABOLIC PANEL
ALT: 15 U/L (ref 0–35)
AST: 18 U/L (ref 0–37)
Albumin: 4 g/dL (ref 3.5–5.2)
Alkaline Phosphatase: 61 U/L (ref 39–117)
BUN: 22 mg/dL (ref 6–23)
CO2: 32 mEq/L (ref 19–32)
Calcium: 9.6 mg/dL (ref 8.4–10.5)
Chloride: 100 mEq/L (ref 96–112)
Creatinine, Ser: 0.68 mg/dL (ref 0.40–1.20)
GFR: 84.21 mL/min (ref >60.00)
Glucose, Bld: 89 mg/dL (ref 70–99)
Potassium: 4.5 mEq/L (ref 3.5–5.1)
Sodium: 138 mEq/L (ref 135–145)
Total Bilirubin: 0.6 mg/dL (ref 0.2–1.2)
Total Protein: 7.1 g/dL (ref 6.0–8.3)

## 2022-04-20 LAB — CBC
HCT: 40 % (ref 36.0–46.0)
Hemoglobin: 13.4 g/dL (ref 12.0–15.0)
MCHC: 33.5 g/dL (ref 30.0–36.0)
MCV: 98 fl (ref 78.0–100.0)
Platelets: 184 10*3/uL (ref 150.0–400.0)
RBC: 4.08 Mil/uL (ref 3.87–5.11)
RDW: 13.1 % (ref 11.5–15.5)
WBC: 5 10*3/uL (ref 4.0–10.5)

## 2022-04-20 LAB — LIPID PANEL
Cholesterol: 169 mg/dL (ref 0–200)
HDL: 47.9 mg/dL (ref >39.00)
NonHDL: 121.12
Total CHOL/HDL Ratio: 4
Triglycerides: 267 mg/dL — ABNORMAL HIGH (ref 0.0–149.0)
VLDL: 53.4 mg/dL — ABNORMAL HIGH (ref 0.0–40.0)

## 2022-04-20 LAB — HEMOGLOBIN A1C: Hgb A1c MFr Bld: 5.6 % (ref 4.6–6.5)

## 2022-04-20 LAB — LDL CHOLESTEROL, DIRECT: Direct LDL: 89 mg/dL

## 2022-04-20 MED ORDER — PANTOPRAZOLE SODIUM 40 MG PO TBEC: 40.0000 mg | DELAYED_RELEASE_TABLET | Freq: Every day | ORAL | 3 refills | Status: DC

## 2022-04-20 MED ORDER — PRAVASTATIN SODIUM 20 MG PO TABS: 20.0000 mg | ORAL_TABLET | Freq: Every day | ORAL | 3 refills | Status: DC

## 2022-04-20 MED ORDER — FLUTICASONE PROPIONATE 50 MCG/ACT NA SUSP: NASAL | 6 refills | Status: DC

## 2022-04-20 MED ORDER — BENAZEPRIL HCL 40 MG PO TABS: 40.0000 mg | ORAL_TABLET | Freq: Every day | ORAL | 3 refills | Status: DC

## 2022-04-20 MED ORDER — BISOPROLOL-HYDROCHLOROTHIAZIDE 5-6.25 MG PO TABS: 1.0000 | ORAL_TABLET | Freq: Every day | ORAL | 3 refills | Status: DC

## 2022-04-20 NOTE — Assessment & Plan Note (Signed)
Flu shot up to date. Covid-19 up to date. Pneumonia complete. Shingrix complete. RSV complete. Tetanus due 2033. Colonoscopy aged out future. Mammogram due 2024, pap smear aged out and dexa due 2025. Counseled about sun safety and mole surveillance. Counseled about the dangers of distracted driving. Given 10 year screening recommendations.

## 2022-04-20 NOTE — Assessment & Plan Note (Signed)
Checking lipid panel and adjust as needed her pravastatin 20 mg daily. Continue.

## 2022-04-20 NOTE — Assessment & Plan Note (Signed)
DEXA due 2025

## 2022-04-20 NOTE — Assessment & Plan Note (Signed)
BMI 38 and complicated by hypertension and hyperlipidemia. Checking HgA1c and lipid panel and adjust as needed. She is working on weight loss and down 20 pounds from last year. Congratulated and encouraged to continue.

## 2022-04-20 NOTE — Assessment & Plan Note (Signed)
Continues with yearly mammogram.

## 2022-04-20 NOTE — Progress Notes (Signed)
Subjective:   Patient ID: Kim Fuller, female    DOB: April 15, 1946, 76 y.o.   MRN: [id number]  HPI Here for medicare wellness, and follow up. Please see A/P for status and treatment of chronic medical problems.   Diet: heart healthy Physical activity: sedentary Depression/mood screen: negative Hearing: intact to whispered voice Visual acuity: grossly normal, performs annual eye exam  ADLs: capable Fall risk: none Home safety: good Cognitive evaluation: intact to orientation, naming, recall and repetition EOL planning: adv directives discussed  [place] Visit from 04/20/2022 in [place] at [facility]  PHQ-2 Total Score [place] Office Visit from 04/20/2022 in [place] at [facility]  PHQ-9 Total Score 0         04/14/2020   12:34 PM 04/19/2021   10:31 AM 11/09/2021   10:00 AM 03/05/2022    1:48 PM 04/20/2022   10:02 AM  [place] in the past year? 0 0  0 0  Was there an injury with Fall? 0 0  0 0  Fall Risk Category Calculator 0 0  0 0  Fall Risk Category Low Low  Low Low  Patient Fall Risk Level Low fall risk  Low fall risk    Patient at Risk for Falls Due to No Fall Risks      Fall risk Follow up Falls evaluation completed   Falls evaluation completed Falls evaluation completed    I have personally reviewed and have noted 1. The patient's medical and social history - reviewed today no changes 2. Their use of alcohol, tobacco or illicit drugs 3. Their current medications and supplements 4. The patient's functional ability including ADL's, fall risks, home safety risks and hearing or visual impairment. 5. Diet and physical activities 6. Evidence for depression or mood disorders 7. Care team reviewed and updated 8.  The patient is not on an opioid pain medication  Patient Care Team: Hoyt Koch, MD as PCP - General (Internal Medicine) Mauro Kaufmann, RN as Oncology Nurse Navigator Rockwell Germany, RN as Oncology Nurse Navigator Donnie Mesa, MD as Consulting Physician (General Surgery) Nicholas Lose, MD as Consulting Physician (Hematology and Oncology) Kyung Rudd, MD as Consulting Physician ([place]) Rutherford Guys, MD as Consulting Physician (Ophthalmology) Delice Bison, Darnelle Maffucci, [facility] (Inactive) (Pharmacist) Past Medical History:  Diagnosis Date   Abnormal finding on Pap smear    x1   Allergy    Arthritis    severe in back and left hip   Asthma    Cancer [facility])    breast cancer (left), chemotherapy and radiation   Cataract    had surgery   Diverticulosis    GERD (gastroesophageal reflux disease)    Hyperlipidemia    Hypertension    Mitral valve prolapse    Osteopenia    -1.5 @ femoral neck 11/2007   Reactive airway disease    years ago ; severe asthma attack "bornchitis asthma " per patient, no issues since    Past Surgical History:  Procedure Laterality Date   BREAST LUMPECTOMY WITH RADIOACTIVE SEED AND SENTINEL LYMPH NODE BIOPSY Left 09/16/2019   Procedure: LEFT BREAST LUMPECTOMY WITH RADIOACTIVE SEED AND SENTINEL LYMPH NODE BIOPSY;  Surgeon: Donnie Mesa, MD;  Location: [place];  Service: General;  Laterality: Left;   CARDIOVASCULAR STRESS TEST  03/05/06   CARPAL TUNNEL RELEASE  2009    bilaterally   CATARACT EXTRACTION, BILATERAL  Dr Gershon Crane   COLONOSCOPY W/ POLYPECTOMY  1998   Tics @ 2004 & 2012; Dr Carlean Purl   HAND SURGERY     Trigger thumb   PARTIAL KNEE ARTHROPLASTY Left 04/03/2017   Procedure: Left knee medial unicompartmental arthroplasty;  Surgeon: Gaynelle Arabian, MD;  [facility];  Service: Orthopedics;  Laterality: Left;  Adductor canal block   PORTACATH PLACEMENT N/A 09/29/2019   Procedure: INSERTION PORT-A-CATH;  Surgeon: Donnie Mesa, MD;  Location: [place];  Service: General;  Laterality: N/A;   TRIGGER FINGER RELEASE Right    TUBAL LIGATION     Family History  Problem Relation Age of Onset   Asthma  Sister    Hypertension Sister    Hypertension Father    Heart attack Father        MI > 40   Prostate cancer Father    Lung cancer Father    Lung cancer Sister        smoker   Melanoma Sister    Osteoporosis Sister    Glaucoma Sister        X 2   Diverticulosis Sister        2 sisters   Lung cancer Sister    Colon polyps Sister        2 sisters with polyps   Diverticulitis Sister        1 sister   Hypertension Mother    Hypertension Brother    Skin cancer Sister    Diabetes Neg Hx    Stroke Neg Hx    Breast cancer Neg Hx    Colon cancer Neg Hx    Esophageal cancer Neg Hx    Stomach cancer Neg Hx    Review of Systems  Constitutional: Negative.   HENT: Negative.    Eyes: Negative.   Respiratory:  Negative for cough, chest tightness and shortness of breath.   Cardiovascular:  Negative for chest pain, palpitations and leg swelling.  Gastrointestinal:  Negative for abdominal distention, abdominal pain, constipation, diarrhea, nausea and vomiting.  Musculoskeletal: Negative.   Skin: Negative.   Neurological: Negative.   Psychiatric/Behavioral: Negative.      Objective:  Physical Exam Constitutional:      Appearance: She is well-developed. She is obese.  HENT:     Head: Normocephalic and atraumatic.  Cardiovascular:     Rate and Rhythm: Normal rate and regular rhythm.  Pulmonary:     Effort: Pulmonary effort is normal. No respiratory distress.     Breath sounds: Normal breath sounds. No wheezing or rales.  Abdominal:     General: Bowel sounds are normal. There is no distension.     Palpations: Abdomen is soft.     Tenderness: There is no abdominal tenderness. There is no rebound.  Musculoskeletal:        General: Tenderness present.     Cervical back: Normal range of motion.  Skin:    General: Skin is warm and dry.  Neurological:     Mental Status: She is alert and oriented to person, place, and time.     Coordination: Coordination normal.     Vitals:    04/20/22 0956  BP: 120/80  Pulse: (!) 47  Temp: 98.2 F (36.8 C)  TempSrc: Oral  SpO2: 95%  Weight: 203 lb (92.1 kg)  Height: '5\' 1"'$  (1.549 m)    Assessment & Plan:

## 2022-04-20 NOTE — Assessment & Plan Note (Signed)
BP at goal on benazepril 40 mg daily and bisoprolol/hctz 5/6.25 mg daily. Checking CMP and CBC and adjust as needed.

## 2022-04-20 NOTE — Assessment & Plan Note (Signed)
Taking protonix 40 mg daily and will stop soon. Uses as needed.

## 2022-04-23 ENCOUNTER — Ambulatory Visit: Payer: Medicare Other

## 2022-04-23 NOTE — Progress Notes (Signed)
Pt already had AWV with PCP on 04/20/2022.

## 2022-04-23 NOTE — Progress Notes (Deleted)
Subjective:   Kim Fuller is a 76 y.o. female who presents for Medicare Annual (Subsequent) preventive examination.  I connected with Kim Fuller today by telephone and verified that I am speaking with the correct person using two identifiers. I discussed the limitations, risks, security and privacy concerns of performing an evaluation and management service by telephone and the availability of in person appointments. I also discussed with the patient that there may be a patient responsible charge related to this service. The patient expressed understanding and agreed to proceed. Location patient: home Location provider: [place] Persons participating in the visit: Kimorah and Jillene Bucks, [place].  Time Spent with patient on telephone encounter: 15 mins  Review of Systems    No ROS. Medicare Wellness Telephone Visit. Additional risk factors are reflected in social history. Cardiac Risk Factors include: advanced age (>51mn, >>15women);dyslipidemia;hypertension;obesity (BMI >30kg/m2);sedentary lifestyle     Objective:    There were no vitals filed for this visit. There is no height or weight on file to calculate BMI.     04/23/2022   10:08 AM 04/14/2020   12:33 PM 02/16/2020    9:13 AM 11/05/2019    1:34 PM 10/15/2019    8:35 AM 09/16/2019    9:04 AM 09/11/2019    9:21 AM  Advanced Directives  Does Patient Have a Medical Advance Directive? No No No No No No No  Would patient like information on creating a medical advance directive?  No - Patient declined No - Patient declined No - Patient declined No - Patient declined No - Patient declined Yes (MAU/Ambulatory/Procedural Areas - Information given)    Current Medications (verified) Outpatient Encounter Medications as of 04/23/2022  Medication Sig   acetaminophen (TYLENOL) 500 MG tablet Take 500-1,000 mg by mouth every 6 (six) hours as needed (for pain.).   aspirin 81 MG chewable tablet Chew 81 mg by mouth at bedtime.     benazepril (LOTENSIN) 40 MG tablet Take 1 tablet (40 mg total) by mouth daily.   bisoprolol-hydrochlorothiazide (ZIAC) 5-6.25 MG tablet Take 1 tablet by mouth daily.   calcium carbonate (OSCAL) 1500 (600 Ca) MG TABS tablet Take 600 mg of elemental calcium by mouth 2 (two) times daily with a meal.   cetirizine (ZYRTEC) 10 MG tablet Take 10 mg by mouth as needed for allergies.   cholecalciferol (VITAMIN D3) 10 MCG (400 UNIT) TABS tablet Take 400 Units by mouth at bedtime.    cyclobenzaprine (FLEXERIL) 5 MG tablet Take 1 tablet (5 mg total) by mouth 3 (three) times daily as needed for muscle spasms.   dicyclomine (BENTYL) 10 MG capsule Take 1 capsule (10 mg total) by mouth 3 (three) times daily as needed for spasms.   esomeprazole (NEXIUM) 20 MG capsule Take 20 mg by mouth daily as needed (acid reflux/indigestion.).   fluticasone (FLONASE) 50 MCG/ACT nasal spray USE TWO SPRAY(S) IN EACH NOSTRIL DAILY   ibuprofen (ADVIL) 200 MG tablet Take 400 mg by mouth every 8 (eight) hours as needed (pain.).    Multiple Vitamin (MULTIVITAMIN WITH MINERALS) TABS tablet Take 1 tablet by mouth daily. Centrum Silver   Omega-3 Fatty Acids (FISH OIL) 1000 MG CAPS Take 1,000 mg by mouth 2 (two) times daily.   pantoprazole (PROTONIX) 40 MG tablet Take 1 tablet (40 mg total) by mouth daily.   pravastatin (PRAVACHOL) 20 MG tablet Take 1 tablet (20 mg total) by mouth daily.   Propylene Glycol 0.6 % SOLN Place 1 drop 3 (three)  times daily as needed into both eyes (dry eyes).   vitamin E 400 UNIT capsule Take 400 Units by mouth daily.   No facility-administered encounter medications on file as of 04/23/2022.    Allergies (verified) Sulfonamide derivatives and Tramadol hcl   History: Past Medical History:  Diagnosis Date   Abnormal finding on Pap smear    x1   Allergy    Arthritis    severe in back and left hip   Asthma    Cancer ([place])    breast cancer (left), chemotherapy and radiation   Cataract    had surgery    Diverticulosis    GERD (gastroesophageal reflux disease)    Hyperlipidemia    Hypertension    Mitral valve prolapse    Osteopenia    -1.5 @ femoral neck 11/2007   Reactive airway disease    years ago ; severe asthma attack "bornchitis asthma " per patient, no issues since    Past Surgical History:  Procedure Laterality Date   BREAST LUMPECTOMY WITH RADIOACTIVE SEED AND SENTINEL LYMPH NODE BIOPSY Left 09/16/2019   Procedure: LEFT BREAST LUMPECTOMY WITH RADIOACTIVE SEED AND SENTINEL LYMPH NODE BIOPSY;  Surgeon: Donnie Mesa, MD;  Location: [facility];  Service: General;  Laterality: Left;   CARDIOVASCULAR STRESS TEST  03/05/06   CARPAL TUNNEL RELEASE  2009    bilaterally   CATARACT EXTRACTION, BILATERAL     Dr Gershon Crane   COLONOSCOPY W/ POLYPECTOMY  1998   Tics @ 2004 & 2012; Dr Carlean Purl   HAND SURGERY     Trigger thumb   PARTIAL KNEE ARTHROPLASTY Left 04/03/2017   Procedure: Left knee medial unicompartmental arthroplasty;  Surgeon: Gaynelle Arabian, MD;  Location: [facility];  Service: Orthopedics;  Laterality: Left;  Adductor canal block   PORTACATH PLACEMENT N/A 09/29/2019   Procedure: INSERTION PORT-A-CATH;  Surgeon: Donnie Mesa, MD;  Location: [facility];  Service: General;  Laterality: N/A;   TRIGGER FINGER RELEASE Right    TUBAL LIGATION     Family History  Problem Relation Age of Onset   Asthma Sister    Hypertension Sister    Hypertension Father    Heart attack Father        MI > 45   Prostate cancer Father    Lung cancer Father    Lung cancer Sister        smoker   Melanoma Sister    Osteoporosis Sister    Glaucoma Sister        X 2   Diverticulosis Sister        2 sisters   Lung cancer Sister    Colon polyps Sister        2 sisters with polyps   Diverticulitis Sister        1 sister   Hypertension Mother    Hypertension Brother    Skin cancer Sister    Diabetes Neg Hx    Stroke Neg Hx    Breast cancer Neg Hx    Colon cancer Neg Hx     Esophageal cancer Neg Hx    Stomach cancer Neg Hx    Social History   Socioeconomic History   Marital status: Married    Spouse name: Not on file   Number of children: 2   Years of education: Not on file   Highest education level: Not on file  Occupational History   Occupation: retired  Tobacco Use   Smoking status: Former  Types: Cigarettes    Quit date: 05/07/1970    Years since quitting: 51.9   Smokeless tobacco: Never   Tobacco comments:    Smoked as a teen  1962-1972,only up to 3 cigarettes/ day  Vaping Use   Vaping Use: Never used  Substance and Sexual Activity   Alcohol use: Yes    Comment: occas   Drug use: No   Sexual activity: Not Currently  Other Topics Concern   Not on file  Social History Narrative   Not on file   Social Determinants of Health   Financial Resource Strain: Low Risk  (04/23/2022)   Overall Financial Resource Strain (CARDIA)    Difficulty of Paying Living Expenses: Not hard at all  Food Insecurity: No Food Insecurity (04/23/2022)   Hunger Vital Sign    Worried About Running Out of Food in the Last Year: Never true    Ran Out of Food in the Last Year: Never true  Transportation Needs: No Transportation Needs (04/23/2022)   PRAPARE - Hydrologist (Medical): No    Lack of Transportation (Non-Medical): No  Physical Activity: Insufficiently Active (04/23/2022)   Exercise Vital Sign    Days of Exercise per Week: 2 days    Minutes of Exercise per Session: 10 min  Stress: No Stress Concern Present (04/23/2022)   [place]    Feeling of Stress : Not at all  Social Connections: Moderately Isolated (04/23/2022)   Social Connection and Isolation Panel [NHANES]    Frequency of Communication with Friends and Family: More than three times a week    Frequency of Social Gatherings with Friends and Family: Once a week    Attends Religious Services: Never     Marine scientist or Organizations: No    Attends Music therapist: Never    Marital Status: Married    Tobacco Counseling Counseling given: Not Answered Tobacco comments: Smoked as a teen  1962-1972,only up to 3 cigarettes/ day   Clinical Intake:  Pre-visit preparation completed: Yes  Pain : No/denies pain     BMI - recorded: 38 Nutritional Status: BMI > 30  Obese Nutritional Risks: None Diabetes: No  How often do you need to have someone help you when you read instructions, pamphlets, or other written materials from your doctor or pharmacy?: 1 - Never What is the last grade level you completed in school?: 12th grade  Interpreter Needed?: No  Information entered by :: Jillene Bucks, [place]   Activities of Daily Living    04/23/2022   10:08 AM  In your present state of health, do you have any difficulty performing the following activities:  Hearing? 0  Vision? 0  Difficulty concentrating or making decisions? 0  Walking or climbing stairs? 0  Dressing or bathing? 0  Doing errands, shopping? 0  Preparing Food and eating ? N  Using the Toilet? N  In the past six months, have you accidently leaked urine? N  Do you have problems with loss of bowel control? N  Managing your Medications? N  Managing your Finances? N  Housekeeping or managing your Housekeeping? N    Patient Care Team: Hoyt Koch, MD as PCP - General (Internal Medicine) Mauro Kaufmann, RN as Oncology Nurse Navigator Rockwell Germany, RN as Oncology Nurse Navigator Donnie Mesa, MD as Consulting Physician (General Surgery) Nicholas Lose, MD as Consulting Physician (Hematology and Oncology) Kyung Rudd, MD  as Consulting Physician ([place]) Rutherford Guys, MD as Consulting Physician (Ophthalmology) Szabat, Darnelle Maffucci, [facility] (Inactive) (Pharmacist)  Indicate any recent Medical Services you may have received from other than [facility] providers in the past year (date may be  approximate).     Assessment:   This is a routine wellness examination for Kim Fuller.  Hearing/Vision screen Patient denied any hearing difficulty. No hearing aids. Patient wears reading glasses.  Dietary issues and exercise activities discussed: Current Exercise Habits: Home exercise routine, Type of exercise: walking, Time (Minutes): 15, Frequency (Times/Week): 2, Weekly Exercise (Minutes/Week): 30, Intensity: Mild, Exercise limited by: orthopedic condition(s) (back pain)   Goals Addressed             This Visit's Progress    Patient Stated       I would like to work on losing 20 lbs more.        Depression Screen    04/23/2022   10:07 AM 04/20/2022   10:02 AM 03/05/2022    1:49 PM 04/19/2021   10:31 AM 04/14/2020   12:32 PM 04/14/2019   11:27 AM 04/02/2018    9:23 AM  PHQ 2/9 Scores  PHQ - 2 Score 0 0 0 0 0 0 0  PHQ- 9 Score 0 0 0        Fall Risk    04/23/2022   10:08 AM 04/20/2022   10:02 AM 03/05/2022    1:48 PM 04/19/2021   10:31 AM 04/14/2020   12:34 PM  Fall Risk   Falls in the past year? 0 0 0 0 0  Number falls in past yr: 0 0 0 0 0  Injury with Fall? 0 0 0 0 0  Risk for fall due to : No Fall Risks    No Fall Risks  Follow up Falls evaluation completed Falls evaluation completed Falls evaluation completed  Falls evaluation completed    [place]:  Any stairs in or around the home? Yes  If so, are there any without handrails? No  Home free of loose throw rugs in walkways, pet beds, electrical cords, etc? Yes  Adequate lighting in your home to reduce risk of falls? Yes   ASSISTIVE DEVICES UTILIZED TO PREVENT FALLS:  Life alert? No  Use of a cane, walker or w/c? No  Grab bars in the bathroom? Yes  Shower chair or bench in shower? Yes  Elevated toilet seat or a handicapped toilet? Yes   TIMED UP AND GO:  Was the test performed? No .  Length of time to ambulate 10 feet: N/A sec.   Patient stated that she has  no issues with gait or balance; does not use any assistive devices.  Cognitive Function: Patient is cogitatively intact    03/27/2017   11:59 AM  MMSE - Mini Mental State Exam  Orientation to time 5  Orientation to Place 5  Registration 3  Attention/ Calculation 5  Recall 2  Language- name 2 objects 2  Language- repeat 1  Language- follow 3 step command 3  Language- read & follow direction 1  Write a sentence 1  Copy design 1  Total score 29        04/23/2022   10:10 AM  6CIT Screen  What Year? 0 points  What month? 0 points  What time? 0 points  Count back from 20 0 points  Months in reverse 0 points  Repeat phrase 4 points  Total Score 4 points  Immunizations Immunization History  Administered Date(s) Administered   Fluad Quad(high Dose 65+) 02/18/2020, 02/21/2021   Influenza Whole 02/05/2003   Influenza, High Dose Seasonal PF 02/05/2014, 02/04/2016, 02/25/2018, 02/07/2022   Influenza,inj,Quad PF,6+ Mos 02/17/2015   Influenza,inj,quad, With Preservative 02/25/2018   Influenza-Unspecified 03/07/2013, 02/11/2017, 03/01/2018, 03/02/2019   Moderna Covid-19 Vaccine Bivalent Booster 82yr & up 01/17/2021   Moderna SARS-COV2 Booster Vaccination 09/16/2020   Moderna Sars-Covid-2 Vaccination 06/05/2019, 07/01/2019, 12/25/2019, 01/17/2021   Pfizer Covid-19 Vaccine Bivalent Booster 125yr& up 03/20/2022   Pneumococcal Conjugate-13 03/12/2015   Pneumococcal Polysaccharide-23 03/20/2007, 01/29/2013   Respiratory Syncytial Virus Vaccine,Recomb Aduvanted(Arexvy) 01/24/2022   Td 05/08/1999, 06/09/2009   Tdap 12/26/2021   Zoster Recombinat (Shingrix) 09/20/2021, 11/23/2021   Zoster, Live 03/03/2014    TDAP status: Up to date  Flu Vaccine status: Up to date  Pneumococcal vaccine status: Up to date  Covid-19 vaccine status: Completed vaccines  Qualifies for Shingles Vaccine? Yes   Zostavax completed No   Shingrix Completed?: Yes  Screening Tests Health  Maintenance  Topic Date Due   COVID-19 Vaccine (6 - 2023-24 season) 05/15/2022   Medicare Annual Wellness (AWV)  04/24/2023   DTaP/Tdap/Td (4 - Td or Tdap) 12/27/2031   Pneumonia Vaccine 6511Years old  Completed   INFLUENZA VACCINE  Completed   DEXA SCAN  Completed   Hepatitis C Screening  Completed   Zoster Vaccines- Shingrix  Completed   HPV VACCINES  Aged Out   COLONOSCOPY (Pts 45-4936yrnsurance coverage will need to be confirmed)  Discontinued    Health Maintenance  There are no preventive care reminders to display for this patient.  Colorectal cancer screening: Type of screening: Colonoscopy. Completed 08/09/20. Repeat every N/A years  Mammogram status: No longer required due to age.  Bone Density status: Completed 05/30/2020. Results reflect: Bone density results: NORMAL. Repeat every 3 years.  Lung Cancer Screening: (Low Dose CT Chest recommended if Age 37-51-80ars, 30 pack-year currently smoking OR have quit w/in 15years.) does not qualify.   Lung Cancer Screening Referral: N/A  Additional Screening:  Hepatitis C Screening: does qualify; Completed 03/08/2016  Vision Screening: Recommended annual ophthalmology exams for early detection of glaucoma and other disorders of the eye. Is the patient up to date with their annual eye exam?  Yes  Who is the provider or what is the name of the office in which the patient attends annual eye exams? Dr. ShaGershon Crane pt is not established with a provider, would they like to be referred to a provider to establish care? No .   Dental Screening: Recommended annual dental exams for proper oral hygiene  Community Resource Referral / Chronic Care Management: CRR required this visit?  No   CCM required this visit?  No      Plan:     I have personally reviewed and noted the following in the patient's chart:   Medical and social history Use of alcohol, tobacco or illicit drugs  Current medications and supplements including opioid  prescriptions. Patient is not currently taking opioid prescriptions. Functional ability and status Nutritional status Physical activity Advanced directives List of other physicians Hospitalizations, surgeries, and ER visits in previous 12 months Vitals Screenings to include cognitive, depression, and falls Referrals and appointments  In addition, I have reviewed and discussed with patient certain preventive protocols, quality metrics, and best practice recommendations. A written personalized care plan for preventive services as well as general preventive health recommendations were provided to patient.  Rossie Muskrat, [place]   04/23/2022   Nurse Notes: Patient is currently working on finishing her advanced directives. Advised her to bring a copy to office once completed.

## 2022-04-23 NOTE — Addendum Note (Signed)
Addended by: Rossie Muskrat on: 04/23/2022 11:38 AM   Modules accepted: Level of Service

## 2022-05-14 ENCOUNTER — Ambulatory Visit: Payer: BLUE CROSS/BLUE SHIELD | Attending: Surgery

## 2022-05-14 VITALS — Wt 208.1 lb

## 2022-05-14 DIAGNOSIS — Z483 Aftercare following surgery for neoplasm: Secondary | ICD-10-CM

## 2022-05-14 NOTE — Therapy (Signed)
OUTPATIENT PHYSICAL THERAPY SOZO SCREENING NOTE   Patient Name: Kim Fuller MRN: [id number] DOB:Sep 10, 1945, 77 y.o., female Today's Date: 05/14/2022  PCP: Hoyt Koch, MD REFERRING PROVIDER: Donnie Mesa, MD   PT End of Session - 05/14/22 1014     Visit Number 2   # unchanged due to screen only   PT Start Time 1012    PT Stop Time 1017    PT Time Calculation (min) 5 min    Activity Tolerance Patient tolerated treatment well    Behavior During Therapy WFL for tasks assessed/performed             Past Medical History:  Diagnosis Date   Abnormal finding on Pap smear    x1   Allergy    Arthritis    severe in back and left hip   Asthma    Cancer ([place])    breast cancer (left), chemotherapy and radiation   Cataract    had surgery   Diverticulosis    GERD (gastroesophageal reflux disease)    Hyperlipidemia    Hypertension    Mitral valve prolapse    Osteopenia    -1.5 @ femoral neck 11/2007   Reactive airway disease    years ago ; severe asthma attack "bornchitis asthma " per patient, no issues since    Past Surgical History:  Procedure Laterality Date   BREAST LUMPECTOMY WITH RADIOACTIVE SEED AND SENTINEL LYMPH NODE BIOPSY Left 09/16/2019   Procedure: LEFT BREAST LUMPECTOMY WITH RADIOACTIVE SEED AND SENTINEL LYMPH NODE BIOPSY;  Surgeon: Donnie Mesa, MD;  Location: [facility];  Service: General;  Laterality: Left;   CARDIOVASCULAR STRESS TEST  03/05/06   CARPAL TUNNEL RELEASE  2009    bilaterally   CATARACT EXTRACTION, BILATERAL     Dr Gershon Crane   COLONOSCOPY W/ POLYPECTOMY  1998   Tics @ 2004 & 2012; Dr Carlean Purl   HAND SURGERY     Trigger thumb   PARTIAL KNEE ARTHROPLASTY Left 04/03/2017   Procedure: Left knee medial unicompartmental arthroplasty;  Surgeon: Gaynelle Arabian, MD;  [facility];  Service: Orthopedics;  Laterality: Left;  Adductor canal block   PORTACATH PLACEMENT N/A 09/29/2019   Procedure: INSERTION PORT-A-CATH;   Surgeon: Donnie Mesa, MD;  Location: [facility];  Service: General;  Laterality: N/A;   TRIGGER FINGER RELEASE Right    TUBAL LIGATION     Patient Active Problem List   Diagnosis Date Noted   Trigger finger, left middle finger 10/05/2020   Acquired trigger finger of left ring finger 10/05/2020   Trigger finger, right ring finger 08/26/2020   Port-A-Cath in place 10/27/2019   HX: breast cancer 09/08/2019   Acute left-sided low back pain without sciatica 07/06/2019   Morbid obesity ([place]) 04/24/2019   Trigger finger, right middle finger 07/02/2017   Chronic shoulder bursitis, left 11/19/2016   OA (osteoarthritis) of knee 11/18/2015   Routine general medical examination at a health care facility 02/20/2015   GERD 12/31/2007   Osteopenia 10/31/2007   HYPERLIPIDEMIA 03/20/2007   Essential hypertension 03/20/2007    REFERRING DIAG: left breast cancer at risk for lymphedema  THERAPY DIAG: Aftercare following surgery for neoplasm  PERTINENT HISTORY: Patient was diagnosed on 08/19/2019 with left triple negative invasive ductal carcinoma breast cancer. It is grade III with a Ki67 of 50%. Patient reports she underwent a left lumpectomy and sentinel node biopsy (4 negative nodes) on 09/16/2019. She had a partial left knee replacement in 2018 and some chronic  low back pain.   PRECAUTIONS: left UE Lymphedema risk, None  SUBJECTIVE: Pt returns for her 3 month L-Dex screen. "I moved a heavy desk in my house this morning by myself."  PAIN:  Are you having pain? No  SOZO SCREENING: Patient was assessed today using the SOZO machine to determine the lymphedema index score. This was compared to her baseline score. It was determined that she is within the recommended range when compared to her baseline and no further action is needed at this time. She will continue SOZO screenings. These are done every 3 months for 2 years post operatively followed by every 6 months for 2 years, and then annually. (Pt was  due to begin 6 month screening but her change from baseline was much higher than usual so will continue with at least 1 more 3 month screen and pt was agreeable to this.)   L-DEX FLOWSHEETS - 05/14/22 1000       L-DEX LYMPHEDEMA SCREENING   Measurement Type Unilateral    L-DEX MEASUREMENT EXTREMITY Upper Extremity    POSITION  Standing    DOMINANT SIDE Right    At Risk Side Left    BASELINE SCORE (UNILATERAL) 6    L-DEX SCORE (UNILATERAL) 10.8    VALUE CHANGE (UNILAT) 4.8              Otelia Limes, PTA 05/14/2022, 10:16 AM

## 2022-06-06 ENCOUNTER — Ambulatory Visit: Payer: Medicare Other | Admitting: Family Medicine

## 2022-07-13 ENCOUNTER — Other Ambulatory Visit: Payer: Self-pay | Admitting: Obstetrics

## 2022-07-13 DIAGNOSIS — Z1231 Encounter for screening mammogram for malignant neoplasm of breast: Secondary | ICD-10-CM

## 2022-07-24 DIAGNOSIS — H5211 Myopia, right eye: Secondary | ICD-10-CM | POA: Diagnosis not present

## 2022-07-24 DIAGNOSIS — H52203 Unspecified astigmatism, bilateral: Secondary | ICD-10-CM | POA: Diagnosis not present

## 2022-07-24 DIAGNOSIS — H524 Presbyopia: Secondary | ICD-10-CM | POA: Diagnosis not present

## 2022-07-24 DIAGNOSIS — Z961 Presence of intraocular lens: Secondary | ICD-10-CM | POA: Diagnosis not present

## 2022-07-30 DIAGNOSIS — C50912 Malignant neoplasm of unspecified site of left female breast: Secondary | ICD-10-CM | POA: Diagnosis not present

## 2022-08-27 DIAGNOSIS — K080 Exfoliation of teeth due to systemic causes: Secondary | ICD-10-CM | POA: Diagnosis not present

## 2022-08-29 ENCOUNTER — Telehealth: Payer: Medicare Other

## 2022-08-30 ENCOUNTER — Ambulatory Visit
Admission: RE | Admit: 2022-08-30 | Discharge: 2022-08-30 | Disposition: A | Discharge status: Home / Self Care | Payer: Medicare Other | Source: Ambulatory Visit | Attending: Obstetrics | Admitting: Obstetrics

## 2022-08-30 DIAGNOSIS — Z1231 Encounter for screening mammogram for malignant neoplasm of breast: Secondary | ICD-10-CM | POA: Diagnosis not present

## 2022-10-11 ENCOUNTER — Telehealth: Payer: Self-pay | Admitting: Internal Medicine

## 2022-10-11 ENCOUNTER — Encounter: Payer: Self-pay | Admitting: Internal Medicine

## 2022-10-11 ENCOUNTER — Ambulatory Visit (INDEPENDENT_AMBULATORY_CARE_PROVIDER_SITE_OTHER): Payer: Medicare Other | Admitting: Internal Medicine

## 2022-10-11 ENCOUNTER — Other Ambulatory Visit: Payer: Self-pay | Admitting: Internal Medicine

## 2022-10-11 VITALS — BP 140/80 | HR 60 | Temp 98.4°F | Ht 61.0 in | Wt 215.0 lb

## 2022-10-11 DIAGNOSIS — B370 Candidal stomatitis: Secondary | ICD-10-CM | POA: Diagnosis not present

## 2022-10-11 MED ORDER — NYSTATIN 100000 UNIT/ML MT SUSP: 5.0000 mL | Freq: Three times a day (TID) | OROMUCOSAL | 0 refills | Status: AC

## 2022-10-11 NOTE — Patient Instructions (Signed)
We have sent in the liquid medicine to take 5 mL 3 times a day for 5 days to clear this up. Swish around in your mouth for 30 seconds and then swallow.

## 2022-10-11 NOTE — Telephone Encounter (Signed)
Pharmacy called wanted to know do they need to dispense 70 mL or 60 mL and change it to 4 days instead of 5 days for medication nystatin (MYCOSTATIN) 100000 UNIT/ML suspension. Please advise

## 2022-10-11 NOTE — Assessment & Plan Note (Signed)
Likely triggered by excessive mouth breathing during recent URI. URI is improving and does not need treatment. Rx nystatin liquid 5 mL TID for 5 days swish and swallow.

## 2022-10-11 NOTE — Progress Notes (Signed)
   Subjective:   Patient ID: Kim Fuller, female    DOB: 17-Jan-1946, 77 y.o.   MRN: [id number]  HPI The patient is a 77 YO female coming in for possible thrush and sore throat. Recent cold which is improving.  Review of Systems  Constitutional: Negative.   HENT:  Positive for congestion and sore throat.   Eyes: Negative.   Respiratory:  Negative for cough, chest tightness and shortness of breath.   Cardiovascular:  Negative for chest pain, palpitations and leg swelling.  Gastrointestinal:  Negative for abdominal distention, abdominal pain, constipation, diarrhea, nausea and vomiting.  Musculoskeletal: Negative.   Skin: Negative.   Neurological: Negative.   Psychiatric/Behavioral: Negative.      Objective:  Physical Exam Constitutional:      Appearance: She is well-developed.  HENT:     Head: Normocephalic and atraumatic.     Comments: White plaque on the tongue    Right Ear: Tympanic membrane normal.     Left Ear: Tympanic membrane normal.  Cardiovascular:     Rate and Rhythm: Normal rate and regular rhythm.  Pulmonary:     Effort: Pulmonary effort is normal. No respiratory distress.     Breath sounds: Normal breath sounds. No wheezing or rales.  Abdominal:     General: Bowel sounds are normal. There is no distension.     Palpations: Abdomen is soft.     Tenderness: There is no abdominal tenderness. There is no rebound.  Musculoskeletal:     Cervical back: Normal range of motion.  Skin:    General: Skin is warm and dry.  Neurological:     Mental Status: She is alert and oriented to person, place, and time.     Coordination: Coordination normal.     Vitals:   10/11/22 0839 10/11/22 0841  BP: (!) 140/80 (!) 140/80  Pulse: 60   Temp: 98.4 F (36.9 C)   TempSrc: Oral   SpO2: 93%   Weight: 215 lb (97.5 kg)   Height: 5\' 1"  (1.549 m)     Assessment & Plan:

## 2022-10-12 NOTE — Telephone Encounter (Signed)
I would like 5 days and dispense as written thanks. Is there a reason they cannot?

## 2022-10-18 NOTE — Telephone Encounter (Signed)
Pt has already picked this prescription up they gave her 60 ml for 5 days

## 2022-10-24 DIAGNOSIS — L821 Other seborrheic keratosis: Secondary | ICD-10-CM | POA: Diagnosis not present

## 2022-10-24 DIAGNOSIS — D2262 Melanocytic nevi of left upper limb, including shoulder: Secondary | ICD-10-CM | POA: Diagnosis not present

## 2022-10-24 DIAGNOSIS — D225 Melanocytic nevi of trunk: Secondary | ICD-10-CM | POA: Diagnosis not present

## 2022-10-24 DIAGNOSIS — D2261 Melanocytic nevi of right upper limb, including shoulder: Secondary | ICD-10-CM | POA: Diagnosis not present

## 2022-11-04 NOTE — Progress Notes (Signed)
Patient Care Team: Myrlene Broker, MD as PCP - General (Internal Medicine) Pershing Proud, RN as Oncology Nurse Navigator Donnelly Angelica, RN as Oncology Nurse Navigator Manus Rudd, MD as Consulting Physician (General Surgery) Serena Croissant, MD as Consulting Physician (Hematology and Oncology) Dorothy Puffer, MD as Consulting Physician (Radiation Oncology) Jethro Bolus, MD as Consulting Physician (Ophthalmology) Erlene Quan Vinnie Level, [facility] (Inactive) (Pharmacist)  DIAGNOSIS:  Encounter Diagnosis  Name Primary?   HX: breast cancer Yes    SUMMARY OF ONCOLOGIC HISTORY: Oncology History  HX: breast cancer  09/08/2019 Initial Diagnosis   Screening mammogram detected a left breast asymmetry. Diagnostic mammogram and US showed a 0.5cm mass, 2 o'clock position, with no axillary adenopathy. Biopsy showed IDC, grade 3, HER-2 - (0), ER/PR -, Ki67 50%.   09/09/2019 Cancer Staging   Staging form: Breast, AJCC 8th Edition - Clinical stage from 09/09/2019: Stage IB (cT1a, cN0, cM0, G3, ER-, PR-, HER2-) - Signed by Serena Croissant, MD on 09/09/2019   09/16/2019 Surgery   Left lumpectomy (Tsuei): IDC, grade 3, 0.9cm, with high grade DCIS, 4 left axillary lymph nodes negative.   09/16/2019 Cancer Staging   Staging form: Breast, AJCC 8th Edition - Pathologic stage from 09/16/2019: Stage IB (pT1b, pN0, cM0, G3, ER-, PR-, HER2-) - Signed by Loa Socks, NP on 10/07/2019   10/15/2019 - 01/29/2020 Adjuvant Chemotherapy   CMF x 6   02/26/2020 - 03/23/2020 Radiation Therapy   Adjuvant left breast radiation     CHIEF COMPLIANT: Surveillance of breast cancer.  INTERVAL HISTORY: Kim Fuller is a 77 y.o. with above-mentioned history of  triple negative left breast cancer who underwent a left lumpectomy, and completed adjuvant chemotherapy with CMF and adjuvant radiation therapy. She reports that she has arthritis and she has a lot of pain in back and hands. She is taking tylenol and  ibuprofen for relief. She states that she can do exercise and walk but has to use a cane.  ALLERGIES:  is allergic to sulfonamide derivatives and tramadol hcl.  MEDICATIONS:  Current Outpatient Medications  Medication Sig Dispense Refill   acetaminophen (TYLENOL) 500 MG tablet Take 500-1,000 mg by mouth every 6 (six) hours as needed (for pain.).     aspirin 81 MG chewable tablet Chew 81 mg by mouth at bedtime.      benazepril (LOTENSIN) 40 MG tablet Take 1 tablet (40 mg total) by mouth daily. 90 tablet 3   bisoprolol-hydrochlorothiazide (ZIAC) 5-6.25 MG tablet Take 1 tablet by mouth daily. 90 tablet 3   calcium carbonate (OSCAL) 1500 (600 Ca) MG TABS tablet Take 600 mg of elemental calcium by mouth 2 (two) times daily with a meal.     cetirizine (ZYRTEC) 10 MG tablet Take 10 mg by mouth as needed for allergies.     cholecalciferol (VITAMIN D3) 10 MCG (400 UNIT) TABS tablet Take 400 Units by mouth at bedtime.      cyclobenzaprine (FLEXERIL) 5 MG tablet Take 1 tablet (5 mg total) by mouth 3 (three) times daily as needed for muscle spasms. 40 tablet 1   dicyclomine (BENTYL) 10 MG capsule Take 1 capsule (10 mg total) by mouth 3 (three) times daily as needed for spasms. 30 capsule 0   fluticasone (FLONASE) 50 MCG/ACT nasal spray USE TWO SPRAY(S) IN EACH NOSTRIL DAILY 48 g 6   ibuprofen (ADVIL) 200 MG tablet Take 400 mg by mouth every 8 (eight) hours as needed (pain.).      Multiple Vitamin (  MULTIVITAMIN WITH MINERALS) TABS tablet Take 1 tablet by mouth daily. Centrum Silver     Omega-3 Fatty Acids (FISH OIL) 1000 MG CAPS Take 1,000 mg by mouth 2 (two) times daily.     pantoprazole (PROTONIX) 40 MG tablet Take 1 tablet (40 mg total) by mouth daily. 90 tablet 3   pravastatin (PRAVACHOL) 20 MG tablet Take 1 tablet (20 mg total) by mouth daily. 90 tablet 3   Propylene Glycol 0.6 % SOLN Place 1 drop 3 (three) times daily as needed into both eyes (dry eyes).     vitamin E 400 UNIT capsule Take 400 Units  by mouth daily.     No current facility-administered medications for this visit.    PHYSICAL EXAMINATION: ECOG PERFORMANCE STATUS: 1 - Symptomatic but completely ambulatory  Vitals:   11/12/22 0952  BP: (!) 130/56  Pulse: 69  Resp: 18  Temp: 97.7 F (36.5 C)  SpO2: 93%   Filed Weights   11/12/22 0952  Weight: 216 lb 4.8 oz (98.1 kg)    BREAST: No palpable masses or nodules in either right or left breasts. No palpable axillary supraclavicular or infraclavicular adenopathy no breast tenderness or nipple discharge. (exam performed in the presence of a chaperone)  LABORATORY DATA:  I have reviewed the data as listed    Latest Ref Rng & Units 04/20/2022   10:21 AM 04/19/2021   10:54 AM 10/20/2020    9:16 AM  CMP  Glucose 70 - 99 mg/dL 89  84  92   BUN 6 - 23 mg/dL 22  15  18    Creatinine 0.40 - 1.20 mg/dL 1.61  0.96  0.45   Sodium 135 - 145 mEq/L 138  140  139   Potassium 3.5 - 5.1 mEq/L 4.5  4.1  4.1   Chloride 96 - 112 mEq/L 100  100  102   CO2 19 - 32 mEq/L 32  34  30   Calcium 8.4 - 10.5 mg/dL 9.6  40.9  9.4   Total Protein 6.0 - 8.3 g/dL 7.1  7.5  7.2   Total Bilirubin 0.2 - 1.2 mg/dL 0.6  0.7  0.8   Alkaline Phos 39 - 117 U/L 61  53  50   AST 0 - 37 U/L 18  21  22    ALT 0 - 35 U/L 15  20  22      Lab Results  Component Value Date   WBC 5.0 04/20/2022   HGB 13.4 04/20/2022   HCT 40.0 04/20/2022   MCV 98.0 04/20/2022   PLT 184.0 04/20/2022   NEUTROABS 3.5 10/20/2020    ASSESSMENT & PLAN:  HX: breast cancer 09/16/2019: Left lumpectomy (Tsuei): IDC, grade 3, 0.9cm, with high grade DCIS, 4 left axillary lymph nodes negative.  Triple negative with a Ki-67 of 50%   Treatment plan: 1.  Adjuvant chemotherapy with CMF x6 completed 01/29/2020 2.  Adjuvant radiation therapy 02/26/2020-03/23/2020 ------------------------------------------------------------------------------------------------------------------------------------------------------------ Current  Treatment: Surveillance Mammograms 09/03/2022: Benign breast density category B Breast exam 11/12/2022: Benign   CT chest abdomen pelvis in 10/20/2020: No evidence of Met disease, hepatic and renal cysts, bilateral kidney stones, diverticulosis Thyroid nodule noted on CT scan: Thyroid ultrasound: Benign   Severe chronic osteoarthritis: She gets injections into her hand as well as takes ibuprofen and Tylenol for her back.   Return to clinic in 1 year for follow-up and after that she could be seen on an as-needed basis.       No orders of  the defined types were placed in this encounter.  The patient has a good understanding of the overall plan. she agrees with it. she will call with any problems that may develop before the next visit here. Total time spent: 30 mins including face to face time and time spent for planning, charting and co-ordination of care   Tamsen Meek, MD 11/12/22    I Janan Ridge am acting as a Neurosurgeon for [employer]  I have reviewed the above documentation for accuracy and completeness, and I agree with the above.

## 2022-11-12 ENCOUNTER — Other Ambulatory Visit: Payer: Self-pay

## 2022-11-12 ENCOUNTER — Inpatient Hospital Stay: Payer: Medicare Other | Attending: Hematology and Oncology | Admitting: Hematology and Oncology

## 2022-11-12 VITALS — BP 130/56 | HR 69 | Temp 97.7°F | Resp 18 | Ht 61.0 in | Wt 216.3 lb

## 2022-11-12 DIAGNOSIS — Z923 Personal history of irradiation: Secondary | ICD-10-CM | POA: Insufficient documentation

## 2022-11-12 DIAGNOSIS — Z853 Personal history of malignant neoplasm of breast: Secondary | ICD-10-CM | POA: Insufficient documentation

## 2022-11-12 DIAGNOSIS — Z9221 Personal history of antineoplastic chemotherapy: Secondary | ICD-10-CM | POA: Insufficient documentation

## 2022-11-12 DIAGNOSIS — Z79899 Other long term (current) drug therapy: Secondary | ICD-10-CM | POA: Diagnosis not present

## 2022-11-12 DIAGNOSIS — Z7982 Long term (current) use of aspirin: Secondary | ICD-10-CM | POA: Insufficient documentation

## 2022-11-12 NOTE — Assessment & Plan Note (Addendum)
09/16/2019: Left lumpectomy (Tsuei): IDC, grade 3, 0.9cm, with high grade DCIS, 4 left axillary lymph nodes negative.  Triple negative with a Ki-67 of 50%   Treatment plan: 1.  Adjuvant chemotherapy with CMF x6 completed 01/29/2020 2.  Adjuvant radiation therapy 02/26/2020-03/23/2020 ------------------------------------------------------------------------------------------------------------------------------------------------------------ Current Treatment: Surveillance Mammograms 09/03/2022: Benign breast density category B Breast exam 11/12/2022: Benign   CT chest abdomen pelvis in 10/20/2020: No evidence of Met disease, hepatic and renal cysts, bilateral kidney stones, diverticulosis Thyroid nodule noted on CT scan: Thyroid ultrasound: Benign   Severe chronic osteoarthritis: She gets injections into her hand as well as takes ibuprofen and Tylenol for her back.   Return to clinic in 1 year for follow-up and after that she could be seen on an as-needed basis.

## 2022-11-26 ENCOUNTER — Ambulatory Visit: Payer: BLUE CROSS/BLUE SHIELD | Attending: Surgery | Admitting: Rehabilitation

## 2022-11-26 DIAGNOSIS — Z483 Aftercare following surgery for neoplasm: Secondary | ICD-10-CM | POA: Insufficient documentation

## 2022-11-26 NOTE — Therapy (Signed)
OUTPATIENT PHYSICAL THERAPY SOZO SCREENING NOTE   Patient Name: Kim Fuller MRN: [id number] DOB:[birth year], 77 y.o., female Today's Date: 11/26/2022  PCP: Myrlene Broker, MD REFERRING PROVIDER: Manus Rudd, MD   PT End of Session - 11/26/22 1017     Visit Number 2   screen   PT Start Time 1000    PT Stop Time 1015    PT Time Calculation (min) 15 min    Activity Tolerance Patient tolerated treatment well    Behavior During Therapy WFL for tasks assessed/performed             Past Medical History:  Diagnosis Date   Abnormal finding on Pap smear    x1   Allergy    Arthritis    severe in back and left hip   Asthma    Cancer (HCC)    breast cancer (left), chemotherapy and radiation   Cataract    had surgery   Diverticulosis    GERD (gastroesophageal reflux disease)    Hyperlipidemia    Hypertension    Mitral valve prolapse    Osteopenia    -1.5 @ femoral neck 11/2007   Reactive airway disease    years ago ; severe asthma attack "bornchitis asthma " per patient, no issues since    Past Surgical History:  Procedure Laterality Date   BREAST LUMPECTOMY WITH RADIOACTIVE SEED AND SENTINEL LYMPH NODE BIOPSY Left 09/16/2019   Procedure: LEFT BREAST LUMPECTOMY WITH RADIOACTIVE SEED AND SENTINEL LYMPH NODE BIOPSY;  Surgeon: Manus Rudd, MD;  Location: [facility];  Service: General;  Laterality: Left;   CARDIOVASCULAR STRESS TEST  03/05/06   CARPAL TUNNEL RELEASE  2009    bilaterally   CATARACT EXTRACTION, BILATERAL     Dr Nile Riggs   COLONOSCOPY W/ POLYPECTOMY  1998   Tics @ 2004 & 2012; Dr Leone Payor   HAND SURGERY     Trigger thumb   PARTIAL KNEE ARTHROPLASTY Left 04/03/2017   Procedure: Left knee medial unicompartmental arthroplasty;  Surgeon: Ollen Gross, MD;  Location: [facility];  Service: Orthopedics;  Laterality: Left;  Adductor canal block   PORTACATH PLACEMENT N/A 09/29/2019   Procedure: INSERTION PORT-A-CATH;  Surgeon: Manus Rudd, MD;  Location: [facility];  Service: General;  Laterality: N/A;   TRIGGER FINGER RELEASE Right    TUBAL LIGATION     Patient Active Problem List   Diagnosis Date Noted   Thrush 05/02/2021   Trigger finger, left middle finger 10/05/2020   Acquired trigger finger of left ring finger 10/05/2020   Trigger finger, right ring finger 08/26/2020   HX: breast cancer 09/08/2019   Acute left-sided low back pain without sciatica 07/06/2019   Morbid obesity (HCC) 04/24/2019   Trigger finger, right middle finger 07/02/2017   Chronic shoulder bursitis, left 11/19/2016   OA (osteoarthritis) of knee 11/18/2015   Routine general medical examination at a health care facility 02/20/2015   GERD 12/31/2007   Osteopenia 10/31/2007   HYPERLIPIDEMIA 03/20/2007   Essential hypertension 03/20/2007    REFERRING DIAG: left breast cancer at risk for lymphedema  THERAPY DIAG: Aftercare following surgery for neoplasm  PERTINENT HISTORY: Patient was diagnosed on 08/19/2019 with left triple negative invasive ductal carcinoma breast cancer. It is grade III with a Ki67 of 50%. Patient reports she underwent a left lumpectomy and sentinel node biopsy (4 negative nodes) on 09/16/2019. She had a partial left knee replacement in 2018 and some chronic low back pain.   PRECAUTIONS: left  UE Lymphedema risk, None  SUBJECTIVE: Pt returns for her 3 month L-Dex screen. "I moved a heavy desk in my house this morning by myself."  PAIN:  Are you having pain? No  SOZO SCREENING: Patient was assessed today using the SOZO machine to determine the lymphedema index score. This was compared to her baseline score. It was determined that she is within the recommended range when compared to her baseline and no further action is needed at this time. She will continue SOZO screenings. These are done every 3 months for 2 years post operatively followed by every 6 months for 2 years, and then annually. (Pt was due to begin 6 month screening  but her change from baseline was much higher than usual so will continue with at least 1 more 3 month screen and pt was agreeable to this.)   L-DEX FLOWSHEETS - 11/26/22 1000       L-DEX LYMPHEDEMA SCREENING   Measurement Type Unilateral    L-DEX MEASUREMENT EXTREMITY Upper Extremity    POSITION  Standing    DOMINANT SIDE Right    At Risk Side Left    BASELINE SCORE (UNILATERAL) 6    L-DEX SCORE (UNILATERAL) 2.8    VALUE CHANGE (UNILAT) -3.2              Alaycia Eardley R, PT 11/26/2022, 10:17 AM

## 2022-11-30 NOTE — Progress Notes (Unsigned)
[name] [address] [place] [address] Phone: [phone] Subjective:    I'm seeing this patient by the request  of:  Myrlene Broker, MD  CC: trigger finger follow up   [id number]  04/11/2022 Patient has done relatively well with these trigger finger injection patient still would like to avoid any type of surgical intervention but does seem to be worsening secondary to the frequency of these injections.  We discussed with patient icing regimen and home exercises, which activities to do and which ones to avoid.  We discussed bracing at night.  Follow-up again in 6 to 8 weeks      Update 12/03/2022 Kim Fuller is a 77 y.o. female coming in with complaint of L middle finger, trigger finger. Patient states  that she has some improvement but the triggering is starting to come back. Ibu and tylenol are her best friends at night time We discussed bracing at night      Past Medical History:  Diagnosis Date   Abnormal finding on Pap smear    x1   Allergy    Arthritis    severe in back and left hip   Asthma    Cancer (HCC)    breast cancer (left), chemotherapy and radiation   Cataract    had surgery   Diverticulosis    GERD (gastroesophageal reflux disease)    Hyperlipidemia    Hypertension    Mitral valve prolapse    Osteopenia    -1.5 @ femoral neck 11/2007   Reactive airway disease    years ago ; severe asthma attack "bornchitis asthma " per patient, no issues since    Past Surgical History:  Procedure Laterality Date   BREAST LUMPECTOMY WITH RADIOACTIVE SEED AND SENTINEL LYMPH NODE BIOPSY Left 09/16/2019   Procedure: LEFT BREAST LUMPECTOMY WITH RADIOACTIVE SEED AND SENTINEL LYMPH NODE BIOPSY;  Surgeon: Manus Rudd, MD;  Location: [facility];  Service: General;  Laterality: Left;   CARDIOVASCULAR STRESS TEST  03/05/06   CARPAL TUNNEL RELEASE  2009    bilaterally   CATARACT EXTRACTION, BILATERAL      Dr Nile Riggs   COLONOSCOPY W/ POLYPECTOMY  1998   Tics @ 2004 & 2012; Dr Leone Payor   HAND SURGERY     Trigger thumb   PARTIAL KNEE ARTHROPLASTY Left 04/03/2017   Procedure: Left knee medial unicompartmental arthroplasty;  Surgeon: Ollen Gross, MD;  Location: [facility];  Service: Orthopedics;  Laterality: Left;  Adductor canal block   PORTACATH PLACEMENT N/A 09/29/2019   Procedure: INSERTION PORT-A-CATH;  Surgeon: Manus Rudd, MD;  Location: [facility];  Service: General;  Laterality: N/A;   TRIGGER FINGER RELEASE Right    TUBAL LIGATION     Social History   Socioeconomic History   Marital status: Married    Spouse name: Not on file   Number of children: 2   Years of education: Not on file   Highest education level: Not on file  Occupational History   Occupation: retired  Tobacco Use   Smoking status: Former    Current packs/day: 0.00    Types: Cigarettes    Quit date: 05/07/1970    Years since quitting: 52.6   Smokeless tobacco: Never   Tobacco comments:    Smoked as a teen  1962-1972,only up to 3 cigarettes/ day  Vaping Use   Vaping status: Never Used  Substance and Sexual Activity   Alcohol use: Yes    Comment:  occas   Drug use: No   Sexual activity: Not Currently  Other Topics Concern   Not on file  Social History Narrative   Not on file   Social Determinants of Health   Financial Resource Strain: Low Risk  (04/23/2022)   Overall Financial Resource Strain (CARDIA)    Difficulty of Paying Living Expenses: Not hard at all  Food Insecurity: No Food Insecurity (04/23/2022)   Hunger Vital Sign    Worried About Running Out of Food in the Last Year: Never true    Ran Out of Food in the Last Year: Never true  Transportation Needs: No Transportation Needs (04/23/2022)   PRAPARE - Administrator, Civil Service (Medical): No    Lack of Transportation (Non-Medical): No  Physical Activity: Insufficiently Active (04/23/2022)   Exercise Vital Sign    Days of  Exercise per Week: 2 days    Minutes of Exercise per Session: 10 min  Stress: No Stress Concern Present (04/23/2022)   [employer] of Occupational Health - Occupational Stress Questionnaire    Feeling of Stress : Not at all  Social Connections: Moderately Isolated (04/23/2022)   Social Connection and Isolation Panel [NHANES]    Frequency of Communication with Friends and Family: More than three times a week    Frequency of Social Gatherings with Friends and Family: Once a week    Attends Religious Services: Never    Database administrator or Organizations: No    Attends Engineer, structural: Never    Marital Status: Married   Allergies  Allergen Reactions   Sulfonamide Derivatives Rash    Rash Because of a history of documented adverse serious drug reaction;Medi Alert bracelet  is recommended   Tramadol Hcl Other (See Comments)    REACTION: cold sweats, weak, fatigue   Family History  Problem Relation Age of Onset   Asthma Sister    Hypertension Sister    Hypertension Father    Heart attack Father        MI > 28   Prostate cancer Father    Lung cancer Father    Lung cancer Sister        smoker   Melanoma Sister    Osteoporosis Sister    Glaucoma Sister        X 2   Diverticulosis Sister        2 sisters   Lung cancer Sister    Colon polyps Sister        2 sisters with polyps   Diverticulitis Sister        1 sister   Hypertension Mother    Hypertension Brother    Skin cancer Sister    Diabetes Neg Hx    Stroke Neg Hx    Breast cancer Neg Hx    Colon cancer Neg Hx    Esophageal cancer Neg Hx    Stomach cancer Neg Hx      Current Outpatient Medications (Cardiovascular):    benazepril (LOTENSIN) 40 MG tablet, Take 1 tablet (40 mg total) by mouth daily.   bisoprolol-hydrochlorothiazide (ZIAC) 5-6.25 MG tablet, Take 1 tablet by mouth daily.   pravastatin (PRAVACHOL) 20 MG tablet, Take 1 tablet (20 mg total) by mouth daily.  Current Outpatient  Medications (Respiratory):    cetirizine (ZYRTEC) 10 MG tablet, Take 10 mg by mouth as needed for allergies.   fluticasone (FLONASE) 50 MCG/ACT nasal spray, USE TWO SPRAY(S) IN EACH NOSTRIL DAILY  Current Outpatient  Medications (Analgesics):    acetaminophen (TYLENOL) 500 MG tablet, Take 500-1,000 mg by mouth every 6 (six) hours as needed (for pain.).   aspirin 81 MG chewable tablet, Chew 81 mg by mouth at bedtime.    ibuprofen (ADVIL) 200 MG tablet, Take 400 mg by mouth every 8 (eight) hours as needed (pain.).    Current Outpatient Medications (Other):    calcium carbonate (OSCAL) 1500 (600 Ca) MG TABS tablet, Take 600 mg of elemental calcium by mouth 2 (two) times daily with a meal.   cholecalciferol (VITAMIN D3) 10 MCG (400 UNIT) TABS tablet, Take 400 Units by mouth at bedtime.    cyclobenzaprine (FLEXERIL) 5 MG tablet, Take 1 tablet (5 mg total) by mouth 3 (three) times daily as needed for muscle spasms.   dicyclomine (BENTYL) 10 MG capsule, Take 1 capsule (10 mg total) by mouth 3 (three) times daily as needed for spasms.   Multiple Vitamin (MULTIVITAMIN WITH MINERALS) TABS tablet, Take 1 tablet by mouth daily. Centrum Silver   Omega-3 Fatty Acids (FISH OIL) 1000 MG CAPS, Take 1,000 mg by mouth 2 (two) times daily.   pantoprazole (PROTONIX) 40 MG tablet, Take 1 tablet (40 mg total) by mouth daily.   Propylene Glycol 0.6 % SOLN, Place 1 drop 3 (three) times daily as needed into both eyes (dry eyes).   vitamin E 400 UNIT capsule, Take 400 Units by mouth daily.   Reviewed prior external information including notes and imaging from  primary care provider As well as notes that were available from care everywhere and other healthcare systems.  Past medical history, social, surgical and family history all reviewed in electronic medical record.  No pertanent information unless stated regarding to the chief complaint.   Review of Systems:  No headache, visual changes, nausea, vomiting,  diarrhea, constipation, dizziness, abdominal pain, skin rash, fevers, chills, night sweats, weight loss, swollen lymph nodes, body aches, joint swelling, chest pain, shortness of breath, mood changes. POSITIVE muscle aches  Objective  Blood pressure 122/78, pulse 76, height 5\' 1"  (1.549 m), weight 216 lb (98 kg), SpO2 96%.   General: No apparent distress alert and oriented x3 mood and affect normal, dressed appropriately.  HEENT: Pupils equal, extraocular movements intact  Respiratory: Patient's speak in full sentences and does not appear short of breath  Cardiovascular: No lower extremity edema, non tender, no erythema   Ttp on the left 3rd trigger nodule   Procedure: Real-time Ultrasound Guided Injection of flexor tendon sheath third finger left device: GE Logiq Q7 Ultrasound guided injection is preferred based studies that show increased duration, increased effect, greater accuracy, decreased procedural pain, increased response rate, and decreased cost with ultrasound guided versus blind injection.  Verbal informed consent obtained.  Time-out conducted.  Noted no overlying erythema, induration, or other signs of local infection.  Skin prepped in a sterile fashion.  Local anesthesia: Topical Ethyl chloride.  With sterile technique and under real time ultrasound guidance: With a 25-gauge half inch needle injected with 0.5 cc of 0.5% Marcaine and 0.5 cc of Kenalog 40 mg/mL Completed without difficulty  Pain immediately improved suggesting accurate placement of the medication.  Advised to call if fevers/chills, erythema, induration, drainage, or persistent bleeding.  Impression: Technically successful ultrasound guided injection.    Impression and Recommendations:    The above documentation has been reviewed and is accurate and complete Judi Saa, DO

## 2022-12-03 ENCOUNTER — Encounter: Payer: Self-pay | Admitting: Family Medicine

## 2022-12-03 ENCOUNTER — Ambulatory Visit: Payer: Medicare Other | Admitting: Family Medicine

## 2022-12-03 ENCOUNTER — Other Ambulatory Visit: Payer: Self-pay

## 2022-12-03 VITALS — BP 122/78 | HR 76 | Ht 61.0 in | Wt 216.0 lb

## 2022-12-03 DIAGNOSIS — M65332 Trigger finger, left middle finger: Secondary | ICD-10-CM

## 2022-12-03 DIAGNOSIS — M79642 Pain in left hand: Secondary | ICD-10-CM | POA: Diagnosis not present

## 2022-12-03 NOTE — Assessment & Plan Note (Addendum)
Inject given today chronic problem discussed home exercises, increase activity slowly.  Follow-up again 6 to 8 weeks. Given brace

## 2023-01-21 ENCOUNTER — Encounter: Payer: Self-pay | Admitting: Internal Medicine

## 2023-01-21 ENCOUNTER — Ambulatory Visit (INDEPENDENT_AMBULATORY_CARE_PROVIDER_SITE_OTHER): Payer: Medicare Other | Admitting: Internal Medicine

## 2023-01-21 VITALS — BP 118/70 | HR 85 | Temp 98.1°F | Ht 61.0 in | Wt 216.0 lb

## 2023-01-21 DIAGNOSIS — J069 Acute upper respiratory infection, unspecified: Secondary | ICD-10-CM | POA: Diagnosis not present

## 2023-01-21 DIAGNOSIS — I10 Essential (primary) hypertension: Secondary | ICD-10-CM

## 2023-01-21 MED ORDER — HYDROCODONE BIT-HOMATROP MBR 5-1.5 MG/5ML PO SOLN: 5.0000 mL | Freq: Four times a day (QID) | ORAL | 0 refills | Status: DC | PRN

## 2023-01-21 MED ORDER — ALBUTEROL SULFATE HFA 108 (90 BASE) MCG/ACT IN AERS: 2.0000 | INHALATION_SPRAY | Freq: Four times a day (QID) | RESPIRATORY_TRACT | 0 refills | Status: DC | PRN

## 2023-01-21 NOTE — Patient Instructions (Addendum)
      Medications changes include :   hycodan cough syrup, albuterol inhaler      Return if symptoms worsen or fail to improve.

## 2023-01-21 NOTE — Progress Notes (Signed)
Subjective:    Patient ID: Kim Fuller, female    DOB: November 12, 1945, 77 y.o.   MRN: [id number]      HPI Kim Fuller is here for  Chief Complaint  Patient presents with   Cough    Symptoms started on last Wednesday. Back and chest sore from coughing. Dry cough, nasal congestion    She is here for an acute visit for cold symptoms.   Her symptoms started last Wednesday - 5 days ago.  Today is day 6 of symptoms.   She is experiencing mild ear pain, postnasal drip, mild sinus pressure, sore throat, cough that is mildly productive at times, chest pain and back pain from coughing so much and mild headaches.  She denies any shortness of breath, wheeze, fevers or chills.   She is not sleeping with her symptoms   She has tried taking  mucinex, flonase, tylenol   Covid test at home was negative.   Medications and allergies reviewed with patient and updated if appropriate.  Current Outpatient Medications on File Prior to Visit  Medication Sig Dispense Refill   acetaminophen (TYLENOL) 500 MG tablet Take 500-1,000 mg by mouth every 6 (six) hours as needed (for pain.).     aspirin 81 MG chewable tablet Chew 81 mg by mouth at bedtime.      benazepril (LOTENSIN) 40 MG tablet Take 1 tablet (40 mg total) by mouth daily. 90 tablet 3   bisoprolol-hydrochlorothiazide (ZIAC) 5-6.25 MG tablet Take 1 tablet by mouth daily. 90 tablet 3   calcium carbonate (OSCAL) 1500 (600 Ca) MG TABS tablet Take 600 mg of elemental calcium by mouth 2 (two) times daily with a meal.     cholecalciferol (VITAMIN D3) 10 MCG (400 UNIT) TABS tablet Take 400 Units by mouth at bedtime.      ibuprofen (ADVIL) 200 MG tablet Take 400 mg by mouth every 8 (eight) hours as needed (pain.).      Multiple Vitamin (MULTIVITAMIN WITH MINERALS) TABS tablet Take 1 tablet by mouth daily. Centrum Silver     Omega-3 Fatty Acids (FISH OIL) 1000 MG CAPS Take 1,000 mg by mouth 2 (two) times daily.     pantoprazole (PROTONIX) 40 MG tablet  Take 1 tablet (40 mg total) by mouth daily. 90 tablet 3   pravastatin (PRAVACHOL) 20 MG tablet Take 1 tablet (20 mg total) by mouth daily. 90 tablet 3   Propylene Glycol 0.6 % SOLN Place 1 drop 3 (three) times daily as needed into both eyes (dry eyes).     vitamin E 400 UNIT capsule Take 400 Units by mouth daily.     cyclobenzaprine (FLEXERIL) 5 MG tablet Take 1 tablet (5 mg total) by mouth 3 (three) times daily as needed for muscle spasms. (Patient not taking: Reported on 01/21/2023) 40 tablet 1   dicyclomine (BENTYL) 10 MG capsule Take 1 capsule (10 mg total) by mouth 3 (three) times daily as needed for spasms. (Patient not taking: Reported on 01/21/2023) 30 capsule 0   fluticasone (FLONASE) 50 MCG/ACT nasal spray USE TWO SPRAY(S) IN EACH NOSTRIL DAILY (Patient not taking: Reported on 01/21/2023) 48 g 6   No current facility-administered medications on file prior to visit.    Review of Systems  Constitutional:  Negative for chills and fever.  HENT:  Positive for ear pain (mild), postnasal drip, sinus pressure (mild) and sore throat. Negative for congestion and sinus pain.   Respiratory:  Positive for cough (mildly productive). Negative for chest  tightness, shortness of breath and wheezing.   Cardiovascular:  Positive for chest pain (from coughing).  Gastrointestinal:  Negative for diarrhea and nausea.  Musculoskeletal:  Positive for myalgias (mild).  Neurological:  Positive for headaches (mild).       Objective:   Vitals:   01/21/23 1442  BP: 118/70  Pulse: 85  Temp: 98.1 F (36.7 C)  SpO2: 95%   BP Readings from Last 3 Encounters:  01/21/23 118/70  12/03/22 122/78  11/12/22 (!) 130/56   Wt Readings from Last 3 Encounters:  01/21/23 216 lb (98 kg)  12/03/22 216 lb (98 kg)  11/12/22 216 lb 4.8 oz (98.1 kg)   Body mass index is 40.81 kg/m.    Physical Exam Constitutional:      General: She is not in acute distress.    Appearance: Normal appearance. She is not  ill-appearing.  HENT:     Head: Normocephalic and atraumatic.     Right Ear: Tympanic membrane, ear canal and external ear normal.     Left Ear: Tympanic membrane, ear canal and external ear normal.     Mouth/Throat:     Mouth: Mucous membranes are moist.     Pharynx: No oropharyngeal exudate or posterior oropharyngeal erythema.  Eyes:     Conjunctiva/sclera: Conjunctivae normal.  Cardiovascular:     Rate and Rhythm: Normal rate and regular rhythm.  Pulmonary:     Effort: Pulmonary effort is normal. No respiratory distress.     Breath sounds: No wheezing or rales.     Comments: Slight coarse breath sounds with inspiration Musculoskeletal:     Cervical back: Neck supple. No tenderness.  Lymphadenopathy:     Cervical: No cervical adenopathy.  Skin:    General: Skin is warm and dry.  Neurological:     Mental Status: She is alert.            Assessment & Plan:    URI: Acute Symptoms likely viral in nature Rapid COVID test here is negative Hycodan cough syrup, albuterol inhaler Continue symptomatic treatment with over-the-counter cold medications, Tylenol/ibuprofen, Mucinex, Flonase Increase rest and fluids Call if symptoms worsen or do not improve   Hypertension: Chronic Blood pressure well-controlled Okay to continue current over-the-counter cold medication she is taking Continue benazepril 40 mg daily, bisoprolol-HCTZ 5-6.25 mg daily

## 2023-01-22 ENCOUNTER — Ambulatory Visit: Payer: Medicare Other | Admitting: Family Medicine

## 2023-01-23 ENCOUNTER — Telehealth: Payer: Self-pay | Admitting: Internal Medicine

## 2023-01-23 MED ORDER — CEFDINIR 300 MG PO CAPS: 300.0000 mg | ORAL_CAPSULE | Freq: Two times a day (BID) | ORAL | 0 refills | Status: DC

## 2023-01-23 NOTE — Telephone Encounter (Signed)
Spoke with patient today.

## 2023-01-23 NOTE — Telephone Encounter (Signed)
Patient was seen 01/21/23 by Dr. Lawerance Bach for respiratory issues. She was told to call if she was not feeling better and she may need an antibiotic. Patient said she is feeling worse and would like to know if she can have an antibiotic sent to [facility] [address], [place] - [address]. Best callback is [phone].

## 2023-01-23 NOTE — Telephone Encounter (Signed)
Abx sent

## 2023-01-31 NOTE — Progress Notes (Deleted)
[name] [address] [place] [address] Phone: [phone] Subjective:    I'm seeing this patient by the request  of:  Myrlene Broker, MD  CC:   [id number]  12/03/2022 Inject given today chronic problem discussed home exercises, increase activity slowly.  Follow-up again 6 to 8 weeks. Given brace       Update 02/01/2023 Kim Fuller is a 77 y.o. female coming in with complaint of L hand, middle finger pain. Patient states        Past Medical History:  Diagnosis Date   Abnormal finding on Pap smear    x1   Allergy    Arthritis    severe in back and left hip   Asthma    Cancer (HCC)    breast cancer (left), chemotherapy and radiation   Cataract    had surgery   Diverticulosis    GERD (gastroesophageal reflux disease)    Hyperlipidemia    Hypertension    Mitral valve prolapse    Osteopenia    -1.5 @ femoral neck 11/2007   Reactive airway disease    years ago ; severe asthma attack "bornchitis asthma " per patient, no issues since    Past Surgical History:  Procedure Laterality Date   BREAST LUMPECTOMY WITH RADIOACTIVE SEED AND SENTINEL LYMPH NODE BIOPSY Left 09/16/2019   Procedure: LEFT BREAST LUMPECTOMY WITH RADIOACTIVE SEED AND SENTINEL LYMPH NODE BIOPSY;  Surgeon: Manus Rudd, MD;  Location: [facility];  Service: General;  Laterality: Left;   CARDIOVASCULAR STRESS TEST  03/05/06   CARPAL TUNNEL RELEASE  2009    bilaterally   CATARACT EXTRACTION, BILATERAL     Dr Nile Riggs   COLONOSCOPY W/ POLYPECTOMY  1998   Tics @ 2004 & 2012; Dr Leone Payor   HAND SURGERY     Trigger thumb   PARTIAL KNEE ARTHROPLASTY Left 04/03/2017   Procedure: Left knee medial unicompartmental arthroplasty;  Surgeon: Ollen Gross, MD;  Location: [facility];  Service: Orthopedics;  Laterality: Left;  Adductor canal block   PORTACATH PLACEMENT N/A 09/29/2019   Procedure: INSERTION PORT-A-CATH;  Surgeon: Manus Rudd, MD;   Location: [facility];  Service: General;  Laterality: N/A;   TRIGGER FINGER RELEASE Right    TUBAL LIGATION     Social History   Socioeconomic History   Marital status: Married    Spouse name: Not on file   Number of children: 2   Years of education: Not on file   Highest education level: Not on file  Occupational History   Occupation: retired  Tobacco Use   Smoking status: Former    Current packs/day: 0.00    Types: Cigarettes    Quit date: 05/07/1970    Years since quitting: 52.7   Smokeless tobacco: Never   Tobacco comments:    Smoked as a teen  1962-1972,only up to 3 cigarettes/ day  Vaping Use   Vaping status: Never Used  Substance and Sexual Activity   Alcohol use: Yes    Comment: occas   Drug use: No   Sexual activity: Not Currently  Other Topics Concern   Not on file  Social History Narrative   Not on file   Social Determinants of Health   Financial Resource Strain: Low Risk  (04/23/2022)   Overall Financial Resource Strain (CARDIA)    Difficulty of Paying Living Expenses: Not hard at all  Food Insecurity: No Food Insecurity (04/23/2022)   Hunger Vital Sign  Worried About Programme researcher, broadcasting/film/video in the Last Year: Never true    Ran Out of Food in the Last Year: Never true  Transportation Needs: No Transportation Needs (04/23/2022)   PRAPARE - Administrator, Civil Service (Medical): No    Lack of Transportation (Non-Medical): No  Physical Activity: Insufficiently Active (04/23/2022)   Exercise Vital Sign    Days of Exercise per Week: 2 days    Minutes of Exercise per Session: 10 min  Stress: No Stress Concern Present (04/23/2022)   [employer] of Occupational Health - Occupational Stress Questionnaire    Feeling of Stress : Not at all  Social Connections: Moderately Isolated (04/23/2022)   Social Connection and Isolation Panel [NHANES]    Frequency of Communication with Friends and Family: More than three times a week    Frequency of Social  Gatherings with Friends and Family: Once a week    Attends Religious Services: Never    Database administrator or Organizations: No    Attends Engineer, structural: Never    Marital Status: Married   Allergies  Allergen Reactions   Sulfonamide Derivatives Rash    Rash Because of a history of documented adverse serious drug reaction;Medi Alert bracelet  is recommended   Tramadol Hcl Other (See Comments)    REACTION: cold sweats, weak, fatigue   Family History  Problem Relation Age of Onset   Asthma Sister    Hypertension Sister    Hypertension Father    Heart attack Father        MI > 42   Prostate cancer Father    Lung cancer Father    Lung cancer Sister        smoker   Melanoma Sister    Osteoporosis Sister    Glaucoma Sister        X 2   Diverticulosis Sister        2 sisters   Lung cancer Sister    Colon polyps Sister        2 sisters with polyps   Diverticulitis Sister        1 sister   Hypertension Mother    Hypertension Brother    Skin cancer Sister    Diabetes Neg Hx    Stroke Neg Hx    Breast cancer Neg Hx    Colon cancer Neg Hx    Esophageal cancer Neg Hx    Stomach cancer Neg Hx      Current Outpatient Medications (Cardiovascular):    benazepril (LOTENSIN) 40 MG tablet, Take 1 tablet (40 mg total) by mouth daily.   bisoprolol-hydrochlorothiazide (ZIAC) 5-6.25 MG tablet, Take 1 tablet by mouth daily.   pravastatin (PRAVACHOL) 20 MG tablet, Take 1 tablet (20 mg total) by mouth daily.  Current Outpatient Medications (Respiratory):    albuterol (VENTOLIN HFA) 108 (90 Base) MCG/ACT inhaler, Inhale 2 puffs into the lungs every 6 (six) hours as needed for wheezing or shortness of breath.   fluticasone (FLONASE) 50 MCG/ACT nasal spray, USE TWO SPRAY(S) IN EACH NOSTRIL DAILY (Patient not taking: Reported on 01/21/2023)   HYDROcodone bit-homatropine (HYCODAN) 5-1.5 MG/5ML syrup, Take 5 mLs by mouth every 6 (six) hours as needed for cough.  Current  Outpatient Medications (Analgesics):    acetaminophen (TYLENOL) 500 MG tablet, Take 500-1,000 mg by mouth every 6 (six) hours as needed (for pain.).   aspirin 81 MG chewable tablet, Chew 81 mg by mouth at bedtime.  ibuprofen (ADVIL) 200 MG tablet, Take 400 mg by mouth every 8 (eight) hours as needed (pain.).    Current Outpatient Medications (Other):    calcium carbonate (OSCAL) 1500 (600 Ca) MG TABS tablet, Take 600 mg of elemental calcium by mouth 2 (two) times daily with a meal.   cefdinir (OMNICEF) 300 MG capsule, Take 1 capsule (300 mg total) by mouth 2 (two) times daily.   cholecalciferol (VITAMIN D3) 10 MCG (400 UNIT) TABS tablet, Take 400 Units by mouth at bedtime.    cyclobenzaprine (FLEXERIL) 5 MG tablet, Take 1 tablet (5 mg total) by mouth 3 (three) times daily as needed for muscle spasms. (Patient not taking: Reported on 01/21/2023)   dicyclomine (BENTYL) 10 MG capsule, Take 1 capsule (10 mg total) by mouth 3 (three) times daily as needed for spasms. (Patient not taking: Reported on 01/21/2023)   Multiple Vitamin (MULTIVITAMIN WITH MINERALS) TABS tablet, Take 1 tablet by mouth daily. Centrum Silver   Omega-3 Fatty Acids (FISH OIL) 1000 MG CAPS, Take 1,000 mg by mouth 2 (two) times daily.   pantoprazole (PROTONIX) 40 MG tablet, Take 1 tablet (40 mg total) by mouth daily.   Propylene Glycol 0.6 % SOLN, Place 1 drop 3 (three) times daily as needed into both eyes (dry eyes).   vitamin E 400 UNIT capsule, Take 400 Units by mouth daily.   Reviewed prior external information including notes and imaging from  primary care provider As well as notes that were available from care everywhere and other healthcare systems.  Past medical history, social, surgical and family history all reviewed in electronic medical record.  No pertanent information unless stated regarding to the chief complaint.   Review of Systems:  No headache, visual changes, nausea, vomiting, diarrhea, constipation,  dizziness, abdominal pain, skin rash, fevers, chills, night sweats, weight loss, swollen lymph nodes, body aches, joint swelling, chest pain, shortness of breath, mood changes. POSITIVE muscle aches  Objective  There were no vitals taken for this visit.   General: No apparent distress alert and oriented x3 mood and affect normal, dressed appropriately.  HEENT: Pupils equal, extraocular movements intact  Respiratory: Patient's speak in full sentences and does not appear short of breath  Cardiovascular: No lower extremity edema, non tender, no erythema      Impression and Recommendations:

## 2023-02-01 ENCOUNTER — Ambulatory Visit: Payer: Medicare Other | Admitting: Family Medicine

## 2023-02-01 NOTE — Progress Notes (Unsigned)
[facility] [address] [place] [address] Phone: [phone] Subjective:   INadine Counts, am serving as a scribe for Dr. Antoine Primas.  I'm seeing this patient by the request  of:  Myrlene Broker, MD  CC: Hip pain  [id number]  12/03/2022 Inject given today chronic problem discussed home exercises, increase activity slowly.  Follow-up again 6 to 8 weeks. Given brace      Updated 02/06/2023 Kim Fuller is a 77 y.o. female coming in with complaint of hip pain in L side. Trigger finger has been doing well. Hip has hurt for about the past month. Sleeping on her side hurts. Pain is achy. Tylenol and ibuprofen.       Past Medical History:  Diagnosis Date   Abnormal finding on Pap smear    x1   Allergy    Arthritis    severe in back and left hip   Asthma    Cancer (HCC)    breast cancer (left), chemotherapy and radiation   Cataract    had surgery   Diverticulosis    GERD (gastroesophageal reflux disease)    Hyperlipidemia    Hypertension    Mitral valve prolapse    Osteopenia    -1.5 @ femoral neck 11/2007   Reactive airway disease    years ago ; severe asthma attack "bornchitis asthma " per patient, no issues since    Past Surgical History:  Procedure Laterality Date   BREAST LUMPECTOMY WITH RADIOACTIVE SEED AND SENTINEL LYMPH NODE BIOPSY Left 09/16/2019   Procedure: LEFT BREAST LUMPECTOMY WITH RADIOACTIVE SEED AND SENTINEL LYMPH NODE BIOPSY;  Surgeon: Manus Rudd, MD;  Location: [facility];  Service: General;  Laterality: Left;   CARDIOVASCULAR STRESS TEST  03/05/06   CARPAL TUNNEL RELEASE  2009    bilaterally   CATARACT EXTRACTION, BILATERAL     Dr Nile Riggs   COLONOSCOPY W/ POLYPECTOMY  1998   Tics @ 2004 & 2012; Dr Leone Payor   HAND SURGERY     Trigger thumb   PARTIAL KNEE ARTHROPLASTY Left 04/03/2017   Procedure: Left knee medial unicompartmental arthroplasty;  Surgeon: Ollen Gross, MD;   Location: [facility];  Service: Orthopedics;  Laterality: Left;  Adductor canal block   PORTACATH PLACEMENT N/A 09/29/2019   Procedure: INSERTION PORT-A-CATH;  Surgeon: Manus Rudd, MD;  Location: [facility];  Service: General;  Laterality: N/A;   TRIGGER FINGER RELEASE Right    TUBAL LIGATION     Social History   Socioeconomic History   Marital status: Married    Spouse name: Not on file   Number of children: 2   Years of education: Not on file   Highest education level: Not on file  Occupational History   Occupation: retired  Tobacco Use   Smoking status: Former    Current packs/day: 0.00    Types: Cigarettes    Quit date: 05/07/1970    Years since quitting: 52.7   Smokeless tobacco: Never   Tobacco comments:    Smoked as a teen  1962-1972,only up to 3 cigarettes/ day  Vaping Use   Vaping status: Never Used  Substance and Sexual Activity   Alcohol use: Yes    Comment: occas   Drug use: No   Sexual activity: Not Currently  Other Topics Concern   Not on file  Social History Narrative   Not on file   Social Determinants of Health   Financial Resource Strain: Low Risk  (  04/23/2022)   Overall Financial Resource Strain (CARDIA)    Difficulty of Paying Living Expenses: Not hard at all  Food Insecurity: No Food Insecurity (04/23/2022)   Hunger Vital Sign    Worried About Running Out of Food in the Last Year: Never true    Ran Out of Food in the Last Year: Never true  Transportation Needs: No Transportation Needs (04/23/2022)   PRAPARE - Administrator, Civil Service (Medical): No    Lack of Transportation (Non-Medical): No  Physical Activity: Insufficiently Active (04/23/2022)   Exercise Vital Sign    Days of Exercise per Week: 2 days    Minutes of Exercise per Session: 10 min  Stress: No Stress Concern Present (04/23/2022)   [employer] of Occupational Health - Occupational Stress Questionnaire    Feeling of Stress : Not at all  Social Connections:  Moderately Isolated (04/23/2022)   Social Connection and Isolation Panel [NHANES]    Frequency of Communication with Friends and Family: More than three times a week    Frequency of Social Gatherings with Friends and Family: Once a week    Attends Religious Services: Never    Database administrator or Organizations: No    Attends Engineer, structural: Never    Marital Status: Married   Allergies  Allergen Reactions   Sulfonamide Derivatives Rash    Rash Because of a history of documented adverse serious drug reaction;Medi Alert bracelet  is recommended   Tramadol Hcl Other (See Comments)    REACTION: cold sweats, weak, fatigue   Family History  Problem Relation Age of Onset   Asthma Sister    Hypertension Sister    Hypertension Father    Heart attack Father        MI > 13   Prostate cancer Father    Lung cancer Father    Lung cancer Sister        smoker   Melanoma Sister    Osteoporosis Sister    Glaucoma Sister        X 2   Diverticulosis Sister        2 sisters   Lung cancer Sister    Colon polyps Sister        2 sisters with polyps   Diverticulitis Sister        1 sister   Hypertension Mother    Hypertension Brother    Skin cancer Sister    Diabetes Neg Hx    Stroke Neg Hx    Breast cancer Neg Hx    Colon cancer Neg Hx    Esophageal cancer Neg Hx    Stomach cancer Neg Hx      Current Outpatient Medications (Cardiovascular):    benazepril (LOTENSIN) 40 MG tablet, Take 1 tablet (40 mg total) by mouth daily.   bisoprolol-hydrochlorothiazide (ZIAC) 5-6.25 MG tablet, Take 1 tablet by mouth daily.   pravastatin (PRAVACHOL) 20 MG tablet, Take 1 tablet (20 mg total) by mouth daily.  Current Outpatient Medications (Respiratory):    albuterol (VENTOLIN HFA) 108 (90 Base) MCG/ACT inhaler, Inhale 2 puffs into the lungs every 6 (six) hours as needed for wheezing or shortness of breath.   fluticasone (FLONASE) 50 MCG/ACT nasal spray, USE TWO SPRAY(S) IN EACH  NOSTRIL DAILY (Patient not taking: Reported on 01/21/2023)   HYDROcodone bit-homatropine (HYCODAN) 5-1.5 MG/5ML syrup, Take 5 mLs by mouth every 6 (six) hours as needed for cough.  Current Outpatient Medications (Analgesics):  acetaminophen (TYLENOL) 500 MG tablet, Take 500-1,000 mg by mouth every 6 (six) hours as needed (for pain.).   aspirin 81 MG chewable tablet, Chew 81 mg by mouth at bedtime.    ibuprofen (ADVIL) 200 MG tablet, Take 400 mg by mouth every 8 (eight) hours as needed (pain.).    Current Outpatient Medications (Other):    calcium carbonate (OSCAL) 1500 (600 Ca) MG TABS tablet, Take 600 mg of elemental calcium by mouth 2 (two) times daily with a meal.   cefdinir (OMNICEF) 300 MG capsule, Take 1 capsule (300 mg total) by mouth 2 (two) times daily.   cholecalciferol (VITAMIN D3) 10 MCG (400 UNIT) TABS tablet, Take 400 Units by mouth at bedtime.    cyclobenzaprine (FLEXERIL) 5 MG tablet, Take 1 tablet (5 mg total) by mouth 3 (three) times daily as needed for muscle spasms. (Patient not taking: Reported on 01/21/2023)   dicyclomine (BENTYL) 10 MG capsule, Take 1 capsule (10 mg total) by mouth 3 (three) times daily as needed for spasms. (Patient not taking: Reported on 01/21/2023)   Multiple Vitamin (MULTIVITAMIN WITH MINERALS) TABS tablet, Take 1 tablet by mouth daily. Centrum Silver   Omega-3 Fatty Acids (FISH OIL) 1000 MG CAPS, Take 1,000 mg by mouth 2 (two) times daily.   pantoprazole (PROTONIX) 40 MG tablet, Take 1 tablet (40 mg total) by mouth daily.   Propylene Glycol 0.6 % SOLN, Place 1 drop 3 (three) times daily as needed into both eyes (dry eyes).   vitamin E 400 UNIT capsule, Take 400 Units by mouth daily.   Reviewed prior external information including notes and imaging from  primary care provider As well as notes that were available from care everywhere and other healthcare systems.  Past medical history, social, surgical and family history all reviewed in electronic  medical record.  No pertanent information unless stated regarding to the chief complaint.   Review of Systems:  No headache, visual changes, nausea, vomiting, diarrhea, constipation, dizziness, abdominal pain, skin rash, fevers, chills, night sweats, weight loss, swollen lymph nodes, body aches, joint swelling, chest pain, shortness of breath, mood changes. POSITIVE muscle aches  Objective  Blood pressure 124/82, pulse 67, height 5\' 1"  (1.549 m), weight 217 lb (98.4 kg).   General: No apparent distress alert and oriented x3 mood and affect normal, dressed appropriately.  HEENT: Pupils equal, extraocular movements intact  Respiratory: Patient's speak in full sentences and does not appear short of breath  Cardiovascular: No lower extremity edema, non tender, no erythema  Tenderness to palpation over the greater trochanteric area fairly severe on the left side.  Positive FABER test.  Negative straight leg test noted.   Procedure: Real-time Ultrasound Guided Injection of left  greater trochanteric bursitis secondary to patient's body habitus Device: GE Logiq Q7  Ultrasound guided injection is preferred based studies that show increased duration, increased effect, greater accuracy, decreased procedural pain, increased response rate, and decreased cost with ultrasound guided versus blind injection.  Verbal informed consent obtained.  Time-out conducted.  Noted no overlying erythema, induration, or other signs of local infection.  Skin prepped in a sterile fashion.  Local anesthesia: Topical Ethyl chloride.  With sterile technique and under real time ultrasound guidance:  Greater trochanteric area was visualized and patient's bursa was noted. A 22-gauge 3 inch needle was inserted and 4 cc of 0.5% Marcaine and 1 cc of Kenalog 40 mg/dL was injected. Pictures taken Completed without difficulty  Pain immediately resolved suggesting accurate placement of the  medication.  Advised to call if  fevers/chills, erythema, induration, drainage, or persistent bleeding.  Impression: Technically successful ultrasound guided injection.    Impression and Recommendations:    The above documentation has been reviewed and is accurate and complete Judi Saa, DO

## 2023-02-06 ENCOUNTER — Ambulatory Visit: Payer: Medicare Other | Admitting: Family Medicine

## 2023-02-06 ENCOUNTER — Other Ambulatory Visit: Payer: Self-pay

## 2023-02-06 VITALS — BP 124/82 | HR 67 | Ht 61.0 in | Wt 217.0 lb

## 2023-02-06 DIAGNOSIS — M79642 Pain in left hand: Secondary | ICD-10-CM | POA: Diagnosis not present

## 2023-02-06 DIAGNOSIS — M7062 Trochanteric bursitis, left hip: Secondary | ICD-10-CM | POA: Diagnosis not present

## 2023-02-06 NOTE — Patient Instructions (Addendum)
Injection in GT today Ice and Voltaren gel Do prescribed exercises at least 3x a week  See you again in 6-8 weeks

## 2023-02-07 ENCOUNTER — Encounter: Payer: Self-pay | Admitting: Family Medicine

## 2023-02-07 DIAGNOSIS — M7062 Trochanteric bursitis, left hip: Secondary | ICD-10-CM | POA: Insufficient documentation

## 2023-02-07 NOTE — Assessment & Plan Note (Signed)
Patient given injection and tolerated the procedure well, discussed icing regimen and home exercises, discussed avoiding certain activities.  Discussed hip abductor strengthening exercises.  Differential includes lumbar radiculopathy that we will monitor.  Follow-up again in 6 to 8 weeks

## 2023-02-22 ENCOUNTER — Other Ambulatory Visit: Payer: Self-pay | Admitting: Pharmacist

## 2023-02-22 MED ORDER — BENAZEPRIL HCL 20 MG PO TABS: 20.0000 mg | ORAL_TABLET | Freq: Every day | ORAL | 1 refills | Status: DC

## 2023-02-22 NOTE — Progress Notes (Unsigned)
Pharmacy Quality Measure Review  This patient is appearing on a report for being at risk of failing the adherence measure for hypertension (ACEi/ARB) medications this calendar year.   Medication: benazepril 40 mg Last fill date: 11/11/2022 for 90 day supply  Spoke to patient who confirms she is taking benazepril but is taking 1/2 tablet daily. The last Rx filled was filled for 90 tablets to take 1 tablet daily. Did see in chart that last year she was noted to be taking 1/2 tablet daily, but do not see where it was changed back to 1 tablet daily in a visit note.   BP has been well controlled at recent visits.  Will send updated Rx to [facility] so they have the correct Rx on file.   Arbutus Leas, PharmD, BCPS [facility] [phone]

## 2023-02-27 DIAGNOSIS — K080 Exfoliation of teeth due to systemic causes: Secondary | ICD-10-CM | POA: Diagnosis not present

## 2023-03-18 NOTE — Progress Notes (Unsigned)
[name] [address] [place] [address] Phone: [phone] Subjective:   Kim Fuller, am serving as a scribe for Dr. Antoine Primas.  I'm seeing this patient by the request  of:  Myrlene Broker, MD  CC: Left back and leg pain  [id number]  02/06/2023 Patient given injection and tolerated the procedure well, discussed icing regimen and home exercises, discussed avoiding certain activities.  Discussed hip abductor strengthening exercises.  Differential includes lumbar radiculopathy that we will monitor.  Follow-up again in 6 to 8 weeks      Update 03/20/2023 Kim Fuller is a 77 y.o. female coming in with complaint of L hip pain. Patient states that injection was helpful but she still has pain especially when she lies on L side. Pain in piriformis. Pain in foot stopped after getting HOKA recovery sandals.         Past Medical History:  Diagnosis Date   Abnormal finding on Pap smear    x1   Allergy    Arthritis    severe in back and left hip   Asthma    Cancer (HCC)    breast cancer (left), chemotherapy and radiation   Cataract    had surgery   Diverticulosis    GERD (gastroesophageal reflux disease)    Hyperlipidemia    Hypertension    Mitral valve prolapse    Osteopenia    -1.5 @ femoral neck 11/2007   Reactive airway disease    years ago ; severe asthma attack "bornchitis asthma " per patient, no issues since    Past Surgical History:  Procedure Laterality Date   BREAST LUMPECTOMY WITH RADIOACTIVE SEED AND SENTINEL LYMPH NODE BIOPSY Left 09/16/2019   Procedure: LEFT BREAST LUMPECTOMY WITH RADIOACTIVE SEED AND SENTINEL LYMPH NODE BIOPSY;  Surgeon: Manus Rudd, MD;  Location: [facility];  Service: General;  Laterality: Left;   CARDIOVASCULAR STRESS TEST  03/05/06   CARPAL TUNNEL RELEASE  2009    bilaterally   CATARACT EXTRACTION, BILATERAL     Dr Nile Riggs   COLONOSCOPY W/ POLYPECTOMY  1998    Tics @ 2004 & 2012; Dr Leone Payor   HAND SURGERY     Trigger thumb   PARTIAL KNEE ARTHROPLASTY Left 04/03/2017   Procedure: Left knee medial unicompartmental arthroplasty;  Surgeon: Ollen Gross, MD;  Location: [facility];  Service: Orthopedics;  Laterality: Left;  Adductor canal block   PORTACATH PLACEMENT N/A 09/29/2019   Procedure: INSERTION PORT-A-CATH;  Surgeon: Manus Rudd, MD;  Location: [facility];  Service: General;  Laterality: N/A;   TRIGGER FINGER RELEASE Right    TUBAL LIGATION     Social History   Socioeconomic History   Marital status: Married    Spouse name: Not on file   Number of children: 2   Years of education: Not on file   Highest education level: Not on file  Occupational History   Occupation: retired  Tobacco Use   Smoking status: Former    Current packs/day: 0.00    Types: Cigarettes    Quit date: 05/07/1970    Years since quitting: 52.9   Smokeless tobacco: Never   Tobacco comments:    Smoked as a teen  1962-1972,only up to 3 cigarettes/ day  Vaping Use   Vaping status: Never Used  Substance and Sexual Activity   Alcohol use: Yes    Comment: occas   Drug use: No   Sexual activity: Not Currently  Other  Topics Concern   Not on file  Social History Narrative   Not on file   Social Determinants of Health   Financial Resource Strain: Low Risk  (04/23/2022)   Overall Financial Resource Strain (CARDIA)    Difficulty of Paying Living Expenses: Not hard at all  Food Insecurity: No Food Insecurity (04/23/2022)   Hunger Vital Sign    Worried About Running Out of Food in the Last Year: Never true    Ran Out of Food in the Last Year: Never true  Transportation Needs: No Transportation Needs (04/23/2022)   PRAPARE - Administrator, Civil Service (Medical): No    Lack of Transportation (Non-Medical): No  Physical Activity: Insufficiently Active (04/23/2022)   Exercise Vital Sign    Days of Exercise per Week: 2 days    Minutes of Exercise per  Session: 10 min  Stress: No Stress Concern Present (04/23/2022)   [employer] of Occupational Health - Occupational Stress Questionnaire    Feeling of Stress : Not at all  Social Connections: Moderately Isolated (04/23/2022)   Social Connection and Isolation Panel [NHANES]    Frequency of Communication with Friends and Family: More than three times a week    Frequency of Social Gatherings with Friends and Family: Once a week    Attends Religious Services: Never    Database administrator or Organizations: No    Attends Engineer, structural: Never    Marital Status: Married   Allergies  Allergen Reactions   Sulfonamide Derivatives Rash    Rash Because of a history of documented adverse serious drug reaction;Medi Alert bracelet  is recommended   Tramadol Hcl Other (See Comments)    REACTION: cold sweats, weak, fatigue   Family History  Problem Relation Age of Onset   Asthma Sister    Hypertension Sister    Hypertension Father    Heart attack Father        MI > 66   Prostate cancer Father    Lung cancer Father    Lung cancer Sister        smoker   Melanoma Sister    Osteoporosis Sister    Glaucoma Sister        X 2   Diverticulosis Sister        2 sisters   Lung cancer Sister    Colon polyps Sister        2 sisters with polyps   Diverticulitis Sister        1 sister   Hypertension Mother    Hypertension Brother    Skin cancer Sister    Diabetes Neg Hx    Stroke Neg Hx    Breast cancer Neg Hx    Colon cancer Neg Hx    Esophageal cancer Neg Hx    Stomach cancer Neg Hx      Current Outpatient Medications (Cardiovascular):    benazepril (LOTENSIN) 20 MG tablet, Take 1 tablet (20 mg total) by mouth daily.   bisoprolol-hydrochlorothiazide (ZIAC) 5-6.25 MG tablet, Take 1 tablet by mouth daily.   pravastatin (PRAVACHOL) 20 MG tablet, Take 1 tablet (20 mg total) by mouth daily.  Current Outpatient Medications (Respiratory):    albuterol (VENTOLIN HFA)  108 (90 Base) MCG/ACT inhaler, Inhale 2 puffs into the lungs every 6 (six) hours as needed for wheezing or shortness of breath.   fluticasone (FLONASE) 50 MCG/ACT nasal spray, USE TWO SPRAY(S) IN EACH NOSTRIL DAILY   HYDROcodone bit-homatropine (HYCODAN)  5-1.5 MG/5ML syrup, Take 5 mLs by mouth every 6 (six) hours as needed for cough.  Current Outpatient Medications (Analgesics):    acetaminophen (TYLENOL) 500 MG tablet, Take 500-1,000 mg by mouth every 6 (six) hours as needed (for pain.).   aspirin 81 MG chewable tablet, Chew 81 mg by mouth at bedtime.    ibuprofen (ADVIL) 200 MG tablet, Take 400 mg by mouth every 8 (eight) hours as needed (pain.).    Current Outpatient Medications (Other):    calcium carbonate (OSCAL) 1500 (600 Ca) MG TABS tablet, Take 600 mg of elemental calcium by mouth 2 (two) times daily with a meal.   cefdinir (OMNICEF) 300 MG capsule, Take 1 capsule (300 mg total) by mouth 2 (two) times daily.   cholecalciferol (VITAMIN D3) 10 MCG (400 UNIT) TABS tablet, Take 400 Units by mouth at bedtime.    cyclobenzaprine (FLEXERIL) 5 MG tablet, Take 1 tablet (5 mg total) by mouth 3 (three) times daily as needed for muscle spasms.   dicyclomine (BENTYL) 10 MG capsule, Take 1 capsule (10 mg total) by mouth 3 (three) times daily as needed for spasms.   Multiple Vitamin (MULTIVITAMIN WITH MINERALS) TABS tablet, Take 1 tablet by mouth daily. Centrum Silver   Omega-3 Fatty Acids (FISH OIL) 1000 MG CAPS, Take 1,000 mg by mouth 2 (two) times daily.   pantoprazole (PROTONIX) 40 MG tablet, Take 1 tablet (40 mg total) by mouth daily.   Propylene Glycol 0.6 % SOLN, Place 1 drop 3 (three) times daily as needed into both eyes (dry eyes).   vitamin E 400 UNIT capsule, Take 400 Units by mouth daily.    Objective  Blood pressure 128/72, pulse (!) 56, height 5\' 1"  (1.549 m), weight 220 lb (99.8 kg), SpO2 98%.   General: No apparent distress alert and oriented x3 mood and affect normal, dressed  appropriately.  HEENT: Pupils equal, extraocular movements intact  Respiratory: Patient's speak in full sentences and does not appear short of breath  Cardiovascular: No lower extremity edema, non tender, no erythema  Low back exam does have some loss lordosis noted.  Some tenderness to palpation in the paraspinal musculature.  Tightness with FABER test on the left side.  Intact tenderness over the bursa on the left side.    Impression and Recommendations:     The above documentation has been reviewed and is accurate and complete Judi Saa, DO

## 2023-03-20 ENCOUNTER — Ambulatory Visit (INDEPENDENT_AMBULATORY_CARE_PROVIDER_SITE_OTHER): Payer: Medicare Other

## 2023-03-20 ENCOUNTER — Encounter: Payer: Self-pay | Admitting: Family Medicine

## 2023-03-20 ENCOUNTER — Ambulatory Visit: Payer: Medicare Other | Admitting: Family Medicine

## 2023-03-20 VITALS — BP 128/72 | HR 56 | Ht 61.0 in | Wt 220.0 lb

## 2023-03-20 DIAGNOSIS — M7062 Trochanteric bursitis, left hip: Secondary | ICD-10-CM | POA: Diagnosis not present

## 2023-03-20 DIAGNOSIS — M160 Bilateral primary osteoarthritis of hip: Secondary | ICD-10-CM | POA: Diagnosis not present

## 2023-03-20 DIAGNOSIS — M545 Low back pain, unspecified: Secondary | ICD-10-CM

## 2023-03-20 DIAGNOSIS — M47816 Spondylosis without myelopathy or radiculopathy, lumbar region: Secondary | ICD-10-CM | POA: Diagnosis not present

## 2023-03-20 DIAGNOSIS — M25552 Pain in left hip: Secondary | ICD-10-CM | POA: Diagnosis not present

## 2023-03-20 NOTE — Patient Instructions (Addendum)
Xray today Keep working on exercises, you're doing well See you again in 3 months

## 2023-03-20 NOTE — Assessment & Plan Note (Signed)
Discussed with patient and feels like she is doing better after the injection.  Wants to continue with conservative therapy.  No other medication changes.  Patient is ambulating with the aid of a cane from time to time.  Discussed anything else such as a walker which patient declined.  Patient wants to continue with conservative therapy and will see me again in 3 months

## 2023-03-27 DIAGNOSIS — K080 Exfoliation of teeth due to systemic causes: Secondary | ICD-10-CM | POA: Diagnosis not present

## 2023-04-08 DIAGNOSIS — Z01419 Encounter for gynecological examination (general) (routine) without abnormal findings: Secondary | ICD-10-CM | POA: Diagnosis not present

## 2023-04-23 ENCOUNTER — Encounter: Payer: Self-pay | Admitting: Internal Medicine

## 2023-04-23 ENCOUNTER — Ambulatory Visit (INDEPENDENT_AMBULATORY_CARE_PROVIDER_SITE_OTHER): Payer: Medicare Other | Admitting: Internal Medicine

## 2023-04-23 VITALS — BP 126/88 | HR 68 | Temp 98.1°F | Ht 61.0 in | Wt 222.0 lb

## 2023-04-23 DIAGNOSIS — I10 Essential (primary) hypertension: Secondary | ICD-10-CM

## 2023-04-23 DIAGNOSIS — Z0000 Encounter for general adult medical examination without abnormal findings: Secondary | ICD-10-CM | POA: Diagnosis not present

## 2023-04-23 DIAGNOSIS — E782 Mixed hyperlipidemia: Secondary | ICD-10-CM | POA: Diagnosis not present

## 2023-04-23 DIAGNOSIS — K219 Gastro-esophageal reflux disease without esophagitis: Secondary | ICD-10-CM

## 2023-04-23 LAB — COMPREHENSIVE METABOLIC PANEL
ALT: 17 U/L (ref 0–35)
AST: 21 U/L (ref 0–37)
Albumin: 4.2 g/dL (ref 3.5–5.2)
Alkaline Phosphatase: 55 U/L (ref 39–117)
BUN: 18 mg/dL (ref 6–23)
CO2: 33 meq/L — ABNORMAL HIGH (ref 19–32)
Calcium: 9.5 mg/dL (ref 8.4–10.5)
Chloride: 101 meq/L (ref 96–112)
Creatinine, Ser: 0.66 mg/dL (ref 0.40–1.20)
GFR: 84.22 mL/min (ref >60.00)
Glucose, Bld: 97 mg/dL (ref 70–99)
Potassium: 4.6 meq/L (ref 3.5–5.1)
Sodium: 140 meq/L (ref 135–145)
Total Bilirubin: 0.7 mg/dL (ref 0.2–1.2)
Total Protein: 7.2 g/dL (ref 6.0–8.3)

## 2023-04-23 LAB — LIPID PANEL
Cholesterol: 159 mg/dL (ref 0–200)
HDL: 46.7 mg/dL (ref >39.00)
LDL Cholesterol: 59 mg/dL (ref 0–99)
NonHDL: 111.92
Total CHOL/HDL Ratio: 3
Triglycerides: 263 mg/dL — ABNORMAL HIGH (ref 0.0–149.0)
VLDL: 52.6 mg/dL — ABNORMAL HIGH (ref 0.0–40.0)

## 2023-04-23 LAB — CBC
HCT: 40.8 % (ref 36.0–46.0)
Hemoglobin: 13.6 g/dL (ref 12.0–15.0)
MCHC: 33.3 g/dL (ref 30.0–36.0)
MCV: 102.2 fL — ABNORMAL HIGH (ref 78.0–100.0)
Platelets: 186 10*3/uL (ref 150.0–400.0)
RBC: 3.99 Mil/uL (ref 3.87–5.11)
RDW: 12.9 % (ref 11.5–15.5)
WBC: 5.1 10*3/uL (ref 4.0–10.5)

## 2023-04-23 LAB — HEMOGLOBIN A1C: Hgb A1c MFr Bld: 5.9 % (ref 4.6–6.5)

## 2023-04-23 MED ORDER — BENAZEPRIL HCL 20 MG PO TABS: 20.0000 mg | ORAL_TABLET | Freq: Every day | ORAL | 3 refills | Status: DC

## 2023-04-23 MED ORDER — PANTOPRAZOLE SODIUM 40 MG PO TBEC: 40.0000 mg | DELAYED_RELEASE_TABLET | Freq: Every day | ORAL | 3 refills | Status: DC

## 2023-04-23 MED ORDER — BISOPROLOL-HYDROCHLOROTHIAZIDE 5-6.25 MG PO TABS: 1.0000 | ORAL_TABLET | Freq: Every day | ORAL | 3 refills | Status: DC

## 2023-04-23 MED ORDER — PRAVASTATIN SODIUM 20 MG PO TABS: 20.0000 mg | ORAL_TABLET | Freq: Every day | ORAL | 3 refills | Status: DC

## 2023-04-23 NOTE — Progress Notes (Unsigned)
Subjective:   Patient ID: Kim Fuller, female    DOB: 05/16/1945, 77 y.o.   MRN: [id number]  HPI Here for medicare wellness and physical, no new complaints. Please see A/P for status and treatment of chronic medical problems.   Diet: heart healthy Physical activity: sedentary Depression/mood screen: negative Hearing: intact to whispered voice Visual acuity: grossly normal, performs annual eye exam  ADLs: capable Fall risk: none Home safety: good Cognitive evaluation: intact to orientation, naming, recall and repetition EOL planning: adv directives discussed, in place  [employer] Visit from 04/23/2023 in [facility] [place]  PHQ-2 Total Score 0       Flowsheet Row Office Visit from 04/23/2023 in [facility]  PHQ-9 Total Score 0         03/05/2022    1:48 PM 04/20/2022   10:02 AM 04/23/2022   10:08 AM 10/11/2022    8:42 AM 04/21/2023    3:51 PM  Fall Risk  Falls in the past year? 0 0 0 0 1   Was there an injury with Fall? 0 0 0 0 0   Fall Risk Category Calculator 0 0 0 0 1   Fall Risk Category (Retired) Low Low Low    (RETIRED) Patient Fall Risk Level   Low fall risk    Patient at Risk for Falls Due to   No Fall Risks    Fall risk Follow up Falls evaluation completed Falls evaluation completed Falls evaluation completed Falls evaluation completed      Patient-reported    I have personally reviewed and have noted 1. The patient's medical and social history - reviewed today no changes 2. Their use of alcohol, tobacco or illicit drugs 3. Their current medications and supplements 4. The patient's functional ability including ADL's, fall risks, home safety risks and hearing or visual impairment. 5. Diet and physical activities 6. Evidence for depression or mood disorders 7. Care team reviewed and updated 8.  The patient is not on an opioid pain medication  Patient Care Team: Myrlene Broker, MD as PCP - General (Internal Medicine) Pershing Proud, RN as Oncology Nurse Navigator Donnelly Angelica, RN as Oncology Nurse Navigator Manus Rudd, MD as Consulting Physician (General Surgery) Serena Croissant, MD as Consulting Physician (Hematology and Oncology) Dorothy Puffer, MD as Consulting Physician (Radiation Oncology) Jethro Bolus, MD as Consulting Physician (Ophthalmology) Erlene Quan, Vinnie Level, [facility] (Inactive) (Pharmacist) Past Medical History:  Diagnosis Date   Abnormal finding on Pap smear    x1   Allergy    Arthritis    severe in back and left hip   Asthma    Cancer [facility])    breast cancer (left), chemotherapy and radiation   Cataract    had surgery   Diverticulosis    GERD (gastroesophageal reflux disease)    Hyperlipidemia    Hypertension    Mitral valve prolapse    Osteopenia    -1.5 @ femoral neck 11/2007   Reactive airway disease    years ago ; severe asthma attack "bornchitis asthma " per patient, no issues since    Past Surgical History:  Procedure Laterality Date   BREAST LUMPECTOMY WITH RADIOACTIVE SEED AND SENTINEL LYMPH NODE BIOPSY Left 09/16/2019   Procedure: LEFT BREAST LUMPECTOMY WITH RADIOACTIVE SEED AND SENTINEL LYMPH NODE BIOPSY;  Surgeon: Manus Rudd, MD;  Location: [facility];  Service: General;  Laterality: Left;   CARDIOVASCULAR STRESS  TEST  03/05/06   CARPAL TUNNEL RELEASE  2009    bilaterally   CATARACT EXTRACTION, BILATERAL     Dr Nile Riggs   COLONOSCOPY W/ POLYPECTOMY  1998   Tics @ 2004 & 2012; Dr Leone Payor   HAND SURGERY     Trigger thumb   PARTIAL KNEE ARTHROPLASTY Left 04/03/2017   Procedure: Left knee medial unicompartmental arthroplasty;  Surgeon: Ollen Gross, MD;  Location: [facility];  Service: Orthopedics;  Laterality: Left;  Adductor canal block   PORTACATH PLACEMENT N/A 09/29/2019   Procedure: INSERTION PORT-A-CATH;  Surgeon: Manus Rudd, MD;  Location: [facility];  Service: General;  Laterality: N/A;    TRIGGER FINGER RELEASE Right    TUBAL LIGATION     Family History  Problem Relation Age of Onset   Asthma Sister    Hypertension Sister    Hypertension Father    Heart attack Father        MI > 21   Prostate cancer Father    Lung cancer Father    Lung cancer Sister        smoker   Melanoma Sister    Osteoporosis Sister    Glaucoma Sister        X 2   Diverticulosis Sister        2 sisters   Lung cancer Sister    Colon polyps Sister        2 sisters with polyps   Diverticulitis Sister        1 sister   Hypertension Mother    Hypertension Brother    Skin cancer Sister    Diabetes Neg Hx    Stroke Neg Hx    Breast cancer Neg Hx    Colon cancer Neg Hx    Esophageal cancer Neg Hx    Stomach cancer Neg Hx    Review of Systems  Constitutional: Negative.   HENT: Negative.    Eyes: Negative.   Respiratory:  Negative for cough, chest tightness and shortness of breath.   Cardiovascular:  Negative for chest pain, palpitations and leg swelling.  Gastrointestinal:  Negative for abdominal distention, abdominal pain, constipation, diarrhea, nausea and vomiting.  Musculoskeletal:  Positive for arthralgias.  Skin: Negative.   Neurological: Negative.   Psychiatric/Behavioral: Negative.      Objective:  Physical Exam Constitutional:      Appearance: She is well-developed. She is obese.  HENT:     Head: Normocephalic and atraumatic.  Cardiovascular:     Rate and Rhythm: Normal rate and regular rhythm.  Pulmonary:     Effort: Pulmonary effort is normal. No respiratory distress.     Breath sounds: Normal breath sounds. No wheezing or rales.  Abdominal:     General: Bowel sounds are normal. There is no distension.     Palpations: Abdomen is soft.     Tenderness: There is no abdominal tenderness. There is no rebound.  Musculoskeletal:     Cervical back: Normal range of motion.  Skin:    General: Skin is warm and dry.  Neurological:     Mental Status: She is alert and  oriented to person, place, and time.     Coordination: Coordination normal.     Vitals:   04/23/23 1010  BP: 126/88  Pulse: 68  Temp: 98.1 F (36.7 C)  TempSrc: Oral  SpO2: 97%  Weight: 222 lb (100.7 kg)  Height: 5\' 1"  (1.549 m)    Assessment & Plan:

## 2023-04-23 NOTE — Patient Instructions (Signed)
  Ms. Kim Fuller , Thank you for taking time to come for your Medicare Wellness Visit. I appreciate your ongoing commitment to your health goals. Please review the following plan we discussed and let me know if I can assist you in the future.   These are the goals we discussed:  Goals      Manage My Medicine     Timeframe:  Long-Range Goal Priority:  Medium Start Date:         07/11/20                    Expected End Date:     08/31/2022                 Follow Up Date 08/2022   - call for medicine refill 2 or 3 days before it runs out - call if I am sick and can't take my medicine - keep a list of all the medicines I take; vitamins and herbals too - use a pillbox to sort medicine    Why is this important?   These steps will help you keep on track with your medicines.   Notes:      patient stated     Increase water intake, I will set out water so I can see it to remind me to drink. Exercise more to help with weight loss.     Patient Stated     I want to continue to stay on the [employer] program, exercise, stay socially active.     Patient Stated     I would like to work on losing 20 lbs more.         This is a list of the screening recommended for you and due dates:  Health Maintenance  Topic Date Due   Medicare Annual Wellness Visit  04/21/2023   COVID-19 Vaccine (7 - 2024-25 season) 04/30/2023   DTaP/Tdap/Td vaccine (4 - Td or Tdap) 12/27/2031   Pneumonia Vaccine  Completed   Flu Shot  Completed   DEXA scan (bone density measurement)  Completed   Hepatitis C Screening  Completed   Zoster (Shingles) Vaccine  Completed   HPV Vaccine  Aged Out   Colon Cancer Screening  Discontinued

## 2023-04-25 ENCOUNTER — Encounter: Payer: BLUE CROSS/BLUE SHIELD | Admitting: Internal Medicine

## 2023-04-25 NOTE — Assessment & Plan Note (Signed)
Checking lipid panel and adjust pravastatin 20 mg daily as needed. 

## 2023-04-25 NOTE — Assessment & Plan Note (Signed)
Counseled about diet and exercise to help.

## 2023-04-25 NOTE — Assessment & Plan Note (Signed)
Flu shot complete for season. Pneumonia complete. Shingrix complete. Tetanus up to date. Colonoscopy up to date. Mammogram up to date, pap smear aged out and dexa complete. Counseled about sun safety and mole surveillance. Counseled about the dangers of distracted driving. Given 10 year screening recommendations.

## 2023-04-25 NOTE — Assessment & Plan Note (Signed)
Taking protonix 40 mg daily and will continue.  ?

## 2023-04-25 NOTE — Assessment & Plan Note (Signed)
Checking CMP and adjust as needed. BP at goal on regimen including benazepril 20 mg daily and bisoprolol/hydrochlorothiazide 5/6.25 mg daily.

## 2023-04-26 ENCOUNTER — Ambulatory Visit (INDEPENDENT_AMBULATORY_CARE_PROVIDER_SITE_OTHER): Payer: Medicare Other

## 2023-04-26 DIAGNOSIS — Z0000 Encounter for general adult medical examination without abnormal findings: Secondary | ICD-10-CM | POA: Diagnosis not present

## 2023-04-26 NOTE — Patient Instructions (Signed)
Ms. Kim Fuller , Thank you for taking time to come for your Medicare Wellness Visit. I appreciate your ongoing commitment to your health goals. Please review the following plan we discussed and let me know if I can assist you in the future.   Referrals/Orders/Follow-Ups/Clinician Recommendations: none  This is a list of the screening recommended for you and due dates:  Health Maintenance  Topic Date Due   COVID-19 Vaccine (7 - 2024-25 season) 04/30/2023   Medicare Annual Wellness Visit  04/25/2024   DTaP/Tdap/Td vaccine (4 - Td or Tdap) 12/27/2031   Pneumonia Vaccine  Completed   Flu Shot  Completed   DEXA scan (bone density measurement)  Completed   Hepatitis C Screening  Completed   Zoster (Shingles) Vaccine  Completed   HPV Vaccine  Aged Out   Colon Cancer Screening  Discontinued    Advanced directives: (ACP Link)Information on Advanced Care Planning can be found at [facility] Advance Health Care Directives Advance Health Care Directives ([email or web address]   Next Medicare Annual Wellness Visit scheduled for next year: Yes  Insert Preventive Care attachment Insert FALL PREVENTION attachment if needed

## 2023-04-26 NOTE — Progress Notes (Signed)
Subjective:   Kim Fuller is a 77 y.o. female who presents for Medicare Annual (Subsequent) preventive examination.  Visit Complete: Virtual I connected with  Valentino Nose on 04/26/23 by a audio enabled telemedicine application and verified that I am speaking with the correct person using two identifiers.  Patient Location: Home  Provider Location: Office/Clinic  I discussed the limitations of evaluation and management by telemedicine. The patient expressed understanding and agreed to proceed.  Vital Signs: Because this visit was a virtual/telehealth visit, some criteria may be missing or patient reported. Any vitals not documented were not able to be obtained and vitals that have been documented are patient reported.  Patient Medicare AWV questionnaire was completed by the patient on 04/21/2023; I have confirmed that all information answered by patient is correct and no changes since this date.  Cardiac Risk Factors include: advanced age (>33men, >45 women);dyslipidemia;hypertension     Objective:    Today's Vitals   There is no height or weight on file to calculate BMI.     04/26/2023   11:31 AM 11/12/2022   10:05 AM 04/23/2022   10:08 AM 04/14/2020   12:33 PM 02/16/2020    9:13 AM 11/05/2019    1:34 PM 10/15/2019    8:35 AM  Advanced Directives  Does Patient Have a Medical Advance Directive? No No No No No No No  Would patient like information on creating a medical advance directive?  No - Patient declined  No - Patient declined No - Patient declined No - Patient declined No - Patient declined    Current Medications (verified) Outpatient Encounter Medications as of 04/26/2023  Medication Sig   acetaminophen (TYLENOL) 500 MG tablet Take 500-1,000 mg by mouth every 6 (six) hours as needed (for pain.).   aspirin 81 MG chewable tablet Chew 81 mg by mouth at bedtime.    benazepril (LOTENSIN) 20 MG tablet Take 1 tablet (20 mg total) by mouth daily.    bisoprolol-hydrochlorothiazide (ZIAC) 5-6.25 MG tablet Take 1 tablet by mouth daily.   calcium carbonate (OSCAL) 1500 (600 Ca) MG TABS tablet Take 600 mg of elemental calcium by mouth 2 (two) times daily with a meal.   cholecalciferol (VITAMIN D3) 10 MCG (400 UNIT) TABS tablet Take 400 Units by mouth at bedtime.    ibuprofen (ADVIL) 200 MG tablet Take 400 mg by mouth every 8 (eight) hours as needed (pain.).    Multiple Vitamin (MULTIVITAMIN WITH MINERALS) TABS tablet Take 1 tablet by mouth daily. Centrum Silver   Omega-3 Fatty Acids (FISH OIL) 1000 MG CAPS Take 1,000 mg by mouth 2 (two) times daily.   pantoprazole (PROTONIX) 40 MG tablet Take 1 tablet (40 mg total) by mouth daily.   pravastatin (PRAVACHOL) 20 MG tablet Take 1 tablet (20 mg total) by mouth daily.   Propylene Glycol 0.6 % SOLN Place 1 drop 3 (three) times daily as needed into both eyes (dry eyes).   vitamin E 400 UNIT capsule Take 400 Units by mouth daily.   albuterol (VENTOLIN HFA) 108 (90 Base) MCG/ACT inhaler Inhale 2 puffs into the lungs every 6 (six) hours as needed for wheezing or shortness of breath. (Patient not taking: Reported on 04/26/2023)   No facility-administered encounter medications on file as of 04/26/2023.    Allergies (verified) Sulfonamide derivatives and Tramadol hcl   History: Past Medical History:  Diagnosis Date   Abnormal finding on Pap smear    x1   Allergy    Arthritis  severe in back and left hip   Asthma    Cancer (HCC)    breast cancer (left), chemotherapy and radiation   Cataract    had surgery   Diverticulosis    GERD (gastroesophageal reflux disease)    Hyperlipidemia    Hypertension    Mitral valve prolapse    Osteopenia    -1.5 @ femoral neck 11/2007   Reactive airway disease    years ago ; severe asthma attack "bornchitis asthma " per patient, no issues since    Past Surgical History:  Procedure Laterality Date   BREAST LUMPECTOMY WITH RADIOACTIVE SEED AND SENTINEL LYMPH  NODE BIOPSY Left 09/16/2019   Procedure: LEFT BREAST LUMPECTOMY WITH RADIOACTIVE SEED AND SENTINEL LYMPH NODE BIOPSY;  Surgeon: Manus Rudd, MD;  Location: [facility];  Service: General;  Laterality: Left;   CARDIOVASCULAR STRESS TEST  03/05/06   CARPAL TUNNEL RELEASE  2009    bilaterally   CATARACT EXTRACTION, BILATERAL     Dr Nile Riggs   COLONOSCOPY W/ POLYPECTOMY  1998   Tics @ 2004 & 2012; Dr Leone Payor   HAND SURGERY     Trigger thumb   PARTIAL KNEE ARTHROPLASTY Left 04/03/2017   Procedure: Left knee medial unicompartmental arthroplasty;  Surgeon: Ollen Gross, MD;  Location: [facility];  Service: Orthopedics;  Laterality: Left;  Adductor canal block   PORTACATH PLACEMENT N/A 09/29/2019   Procedure: INSERTION PORT-A-CATH;  Surgeon: Manus Rudd, MD;  Location: [facility];  Service: General;  Laterality: N/A;   TRIGGER FINGER RELEASE Right    TUBAL LIGATION     Family History  Problem Relation Age of Onset   Asthma Sister    Hypertension Sister    Hypertension Father    Heart attack Father        MI > 43   Prostate cancer Father    Lung cancer Father    Lung cancer Sister        smoker   Melanoma Sister    Osteoporosis Sister    Glaucoma Sister        X 2   Diverticulosis Sister        2 sisters   Lung cancer Sister    Colon polyps Sister        2 sisters with polyps   Diverticulitis Sister        1 sister   Hypertension Mother    Hypertension Brother    Skin cancer Sister    Diabetes Neg Hx    Stroke Neg Hx    Breast cancer Neg Hx    Colon cancer Neg Hx    Esophageal cancer Neg Hx    Stomach cancer Neg Hx    Social History   Socioeconomic History   Marital status: Married    Spouse name: Not on file   Number of children: 2   Years of education: Not on file   Highest education level: Not on file  Occupational History   Occupation: retired  Tobacco Use   Smoking status: Former    Current packs/day: 0.00    Types: Cigarettes    Quit date:  05/07/1970    Years since quitting: 53.0   Smokeless tobacco: Never   Tobacco comments:    Smoked as a teen  1962-1972,only up to 3 cigarettes/ day  Vaping Use   Vaping status: Never Used  Substance and Sexual Activity   Alcohol use: Yes    Comment: occas   Drug use: No  Sexual activity: Not Currently  Other Topics Concern   Not on file  Social History Narrative   Not on file   Social Drivers of Health   Financial Resource Strain: Patient Declined (04/21/2023)   Overall Financial Resource Strain (CARDIA)    Difficulty of Paying Living Expenses: Patient declined  Food Insecurity: No Food Insecurity (04/23/2022)   Hunger Vital Sign    Worried About Running Out of Food in the Last Year: Never true    Ran Out of Food in the Last Year: Never true  Transportation Needs: No Transportation Needs (04/21/2023)   PRAPARE - Administrator, Civil Service (Medical): No    Lack of Transportation (Non-Medical): No  Physical Activity: Insufficiently Active (04/21/2023)   Exercise Vital Sign    Days of Exercise per Week: 3 days    Minutes of Exercise per Session: 10 min  Stress: Patient Declined (04/21/2023)   [employer] of Occupational Health - Occupational Stress Questionnaire    Feeling of Stress : Patient declined  Social Connections: Unknown (04/21/2023)   Social Connection and Isolation Panel [NHANES]    Frequency of Communication with Friends and Family: Patient declined    Frequency of Social Gatherings with Friends and Family: Patient declined    Attends Religious Services: Not on Marketing executive or Organizations: Patient declined    Attends Banker Meetings: Patient declined    Marital Status: Married    Tobacco Counseling Counseling given: Not Answered Tobacco comments: Smoked as a teen  1962-1972,only up to 3 cigarettes/ day   Clinical Intake:  Pre-visit preparation completed: Yes  Pain : No/denies pain     Nutritional  Risks: None Diabetes: No  How often do you need to have someone help you when you read instructions, pamphlets, or other written materials from your doctor or pharmacy?: 1 - Never  Interpreter Needed?: No  Information entered by :: NAllen LPN   Activities of Daily Living    04/21/2023    3:51 PM  In your present state of health, do you have any difficulty performing the following activities:  Hearing? 0  Vision? 0  Difficulty concentrating or making decisions? 0  Walking or climbing stairs? 1  Comment due to hip  Dressing or bathing? 0  Doing errands, shopping? 0  Preparing Food and eating ? N  Using the Toilet? N  In the past six months, have you accidently leaked urine? N  Do you have problems with loss of bowel control? N  Managing your Medications? N  Managing your Finances? N  Housekeeping or managing your Housekeeping? N    Patient Care Team: Myrlene Broker, MD as PCP - General (Internal Medicine) Pershing Proud, RN as Oncology Nurse Navigator Donnelly Angelica, RN as Oncology Nurse Navigator Manus Rudd, MD as Consulting Physician (General Surgery) Serena Croissant, MD as Consulting Physician (Hematology and Oncology) Dorothy Puffer, MD as Consulting Physician (Radiation Oncology) Jethro Bolus, MD as Consulting Physician (Ophthalmology) Szabat, Vinnie Level, [facility] (Inactive) (Pharmacist)  Indicate any recent Medical Services you may have received from other than [facility] providers in the past year (date may be approximate).     Assessment:   This is a routine wellness examination for [place].  Hearing/Vision screen Hearing Screening - Comments:: Denies hearing issues Vision Screening - Comments:: Regular eye exams,    Goals Addressed             This Visit's Progress  Patient Stated       04/26/2023, wants to lose weight       Depression Screen    04/26/2023   11:33 AM 04/23/2023   10:13 AM 10/11/2022    8:42 AM 04/23/2022   10:07 AM 04/20/2022    10:02 AM 03/05/2022    1:49 PM 04/19/2021   10:31 AM  PHQ 2/9 Scores  PHQ - 2 Score 0 0 0 0 0 0 0  PHQ- 9 Score 0 0  0 0 0     Fall Risk    04/21/2023    3:51 PM 10/11/2022    8:42 AM 04/23/2022   10:08 AM 04/20/2022   10:02 AM 03/05/2022    1:48 PM  Fall Risk   Falls in the past year? 1 0 0 0 0  Comment slipped      Number falls in past yr: 0 0 0 0 0  Injury with Fall? 0 0 0 0 0  Risk for fall due to : Medication side effect;Impaired mobility;Impaired balance/gait  No Fall Risks    Follow up Falls prevention discussed;Falls evaluation completed Falls evaluation completed Falls evaluation completed Falls evaluation completed Falls evaluation completed    MEDICARE RISK AT HOME: Medicare Risk at Home Any stairs in or around the home?: Yes If so, are there any without handrails?: No Home free of loose throw rugs in walkways, pet beds, electrical cords, etc?: No Adequate lighting in your home to reduce risk of falls?: Yes Life alert?: No Use of a cane, walker or w/c?: Yes Grab bars in the bathroom?: Yes Shower chair or bench in shower?: Yes Elevated toilet seat or a handicapped toilet?: Yes  TIMED UP AND GO:  Was the test performed?  No    Cognitive Function:    03/27/2017   11:59 AM  MMSE - Mini Mental State Exam  Orientation to time 5  Orientation to Place 5  Registration 3  Attention/ Calculation 5  Recall 2  Language- name 2 objects 2  Language- repeat 1  Language- follow 3 step command 3  Language- read & follow direction 1  Write a sentence 1  Copy design 1  Total score 29        04/26/2023   11:33 AM 04/23/2022   10:10 AM  6CIT Screen  What Year? 0 points 0 points  What month? 0 points 0 points  What time? 0 points 0 points  Count back from 20 0 points 0 points  Months in reverse 0 points 0 points  Repeat phrase 0 points 4 points  Total Score 0 points 4 points    Immunizations Immunization History  Administered Date(s) Administered   Fluad  Quad(high Dose 65+) 02/18/2020, 02/21/2021   Influenza Whole 02/05/2003   Influenza, High Dose Seasonal PF 02/05/2014, 02/04/2016, 02/25/2018, 02/07/2022   Influenza,inj,Quad PF,6+ Mos 02/17/2015   Influenza,inj,quad, With Preservative 02/25/2018   Influenza-Unspecified 03/07/2013, 02/11/2017, 03/01/2018, 03/02/2019, 02/26/2023   Moderna Covid-19 Vaccine Bivalent Booster 69yrs & up 01/17/2021   Moderna SARS-COV2 Booster Vaccination 09/16/2020   Moderna Sars-Covid-2 Vaccination 06/05/2019, 07/01/2019, 12/25/2019, 01/17/2021   Pfizer Covid-19 Vaccine Bivalent Booster 80yrs & up 03/20/2022   Pneumococcal Conjugate-13 03/12/2015   Pneumococcal Polysaccharide-23 03/20/2007, 01/29/2013   Respiratory Syncytial Virus Vaccine,Recomb Aduvanted(Arexvy) 01/24/2022   Td 05/08/1999, 06/09/2009   Tdap 12/26/2021   Unspecified SARS-COV-2 Vaccination 03/05/2023   Zoster Recombinant(Shingrix) 09/20/2021, 11/23/2021   Zoster, Live 03/03/2014    TDAP status: Up to date  Flu Vaccine status:  Up to date  Pneumococcal vaccine status: Up to date  Covid-19 vaccine status: Completed vaccines  Qualifies for Shingles Vaccine? Yes   Zostavax completed Yes   Shingrix Completed?: Yes  Screening Tests Health Maintenance  Topic Date Due   COVID-19 Vaccine (7 - 2024-25 season) 04/30/2023   Medicare Annual Wellness (AWV)  04/25/2024   DTaP/Tdap/Td (4 - Td or Tdap) 12/27/2031   Pneumonia Vaccine 66+ Years old  Completed   INFLUENZA VACCINE  Completed   DEXA SCAN  Completed   Hepatitis C Screening  Completed   Zoster Vaccines- Shingrix  Completed   HPV VACCINES  Aged Out   Colonoscopy  Discontinued    Health Maintenance  Health Maintenance Due  Topic Date Due   COVID-19 Vaccine (7 - 2024-25 season) 04/30/2023    Colorectal cancer screening: No longer required.   Mammogram status: Completed 08/30/2022. Repeat every year  Bone Density status: Completed 05/30/2020.   Lung Cancer Screening: (Low Dose  CT Chest recommended if Age 52-80 years, 20 pack-year currently smoking OR have quit w/in 15years.) does not qualify.   Lung Cancer Screening Referral: no  Additional Screening:  Hepatitis C Screening: does qualify; Completed 03/08/2016  Vision Screening: Recommended annual ophthalmology exams for early detection of glaucoma and other disorders of the eye. Is the patient up to date with their annual eye exam?  Yes  Who is the provider or what is the name of the office in which the patient attends annual eye exams? Looking for new eye doctor If pt is not established with a provider, would they like to be referred to a provider to establish care? No .   Dental Screening: Recommended annual dental exams for proper oral hygiene  Diabetic Foot Exam: n/a  Community Resource Referral / Chronic Care Management: CRR required this visit?  No   CCM required this visit?  No     Plan:     I have personally reviewed and noted the following in the patient's chart:   Medical and social history Use of alcohol, tobacco or illicit drugs  Current medications and supplements including opioid prescriptions. Patient is not currently taking opioid prescriptions. Functional ability and status Nutritional status Physical activity Advanced directives List of other physicians Hospitalizations, surgeries, and ER visits in previous 12 months Vitals Screenings to include cognitive, depression, and falls Referrals and appointments  In addition, I have reviewed and discussed with patient certain preventive protocols, quality metrics, and best practice recommendations. A written personalized care plan for preventive services as well as general preventive health recommendations were provided to patient.     Barb Merino, LPN   96/29/[id number]   After Visit Summary: (MyChart) Due to this being a telephonic visit, the after visit summary with patients personalized plan was offered to patient via MyChart    Nurse Notes: none

## 2023-05-10 ENCOUNTER — Telehealth: Payer: Self-pay

## 2023-05-10 NOTE — Telephone Encounter (Signed)
 Called patient back and explained to her what MD has provided and patient stated she will call back if symptoms dont get better

## 2023-05-10 NOTE — Telephone Encounter (Signed)
 Copied from CRM [id number]. Topic: Clinical - Medical Advice >> May 10, 2023  9:22 AM Thersia BROCKS wrote: Reason for CRM: Patient called in has a cold and sore throat wants to know if there is something that she can be prescribe being that they are no available appointments

## 2023-05-10 NOTE — Telephone Encounter (Signed)
 Pt states that she does not want the cough syrup. She she has a tickle in the throat. She is taking OTC; musinex.zyrtec,robitussin ,tylenol and nasal spray pt states that her main concern is the tickle in her throat. Please advise

## 2023-05-10 NOTE — Telephone Encounter (Signed)
 Ok to use salt water or chlorecedin spray otc to help.

## 2023-05-14 NOTE — Telephone Encounter (Signed)
 Sore throat, and cough but pt states that she does not want the cough syrup and she is horse

## 2023-05-14 NOTE — Telephone Encounter (Signed)
 Pt has been scheduled.

## 2023-05-14 NOTE — Telephone Encounter (Signed)
 Ok for visit acute with anyone

## 2023-05-14 NOTE — Telephone Encounter (Signed)
 COPIED FROM CRM: Patient was told to call back if she's not feeling better, she is experiencing a sore throat & requesting a prescription. Patient would like to speak with nurse.

## 2023-05-15 ENCOUNTER — Ambulatory Visit (INDEPENDENT_AMBULATORY_CARE_PROVIDER_SITE_OTHER): Payer: Medicare Other | Admitting: Internal Medicine

## 2023-05-15 ENCOUNTER — Encounter: Payer: Self-pay | Admitting: Internal Medicine

## 2023-05-15 VITALS — BP 122/76 | HR 75 | Temp 98.5°F | Ht 61.0 in | Wt 225.0 lb

## 2023-05-15 DIAGNOSIS — J069 Acute upper respiratory infection, unspecified: Secondary | ICD-10-CM | POA: Diagnosis not present

## 2023-05-15 DIAGNOSIS — I10 Essential (primary) hypertension: Secondary | ICD-10-CM

## 2023-05-15 MED ORDER — PROMETHAZINE-DM 6.25-15 MG/5ML PO SYRP: 5.0000 mL | ORAL_SOLUTION | Freq: Four times a day (QID) | ORAL | 0 refills | Status: DC | PRN

## 2023-05-15 MED ORDER — AZITHROMYCIN 250 MG PO TABS: ORAL_TABLET | ORAL | 1 refills | Status: AC

## 2023-05-15 NOTE — Patient Instructions (Signed)
Please take all new medication as prescribed - the antibiotic, and cough medicine  Please continue all other medications as before, and refills have been done if requested.  Please have the pharmacy call with any other refills you may need.  Please keep your appointments with your specialists as you may have planned      

## 2023-05-15 NOTE — Progress Notes (Signed)
 Patient ID: Kim Fuller, female   DOB: 04-08-1946, 78 y.o.   MRN: [id number]        Chief Complaint: follow up upper resp symptoms       HPI:  Kim Fuller is a 78 y.o. female  Here with 2-3 days acute onset fever, facial pain, left ear pain, pressure, headache, general weakness and malaise, and greenish d/c, with mild ST and cough and hoarseness, but pt denies chest pain, wheezing, increased sob or doe, orthopnea, PND, increased LE swelling, palpitations, dizziness or syncope. Several OTC meds not helpful including zyrtec, mucinex, robitussin DM, chloraseptic.  Pt denies chest pain, increased sob or doe, wheezing, orthopnea, PND, increased LE swelling, palpitations, dizziness or syncope.   Pt denies polydipsia, polyuria, or new focal neuro s/s.          Wt Readings from Last 3 Encounters:  05/15/23 225 lb (102.1 kg)  04/23/23 222 lb (100.7 kg)  03/20/23 220 lb (99.8 kg)   BP Readings from Last 3 Encounters:  05/15/23 122/76  04/23/23 126/88  03/20/23 128/72         Past Medical History:  Diagnosis Date   Abnormal finding on Pap smear    x1   Allergy    Arthritis    severe in back and left hip   Asthma    Cancer (HCC)    breast cancer (left), chemotherapy and radiation   Cataract    had surgery   Diverticulosis    GERD (gastroesophageal reflux disease)    Hyperlipidemia    Hypertension    Mitral valve prolapse    Osteopenia    -1.5 @ femoral neck 11/2007   Reactive airway disease    years ago ; severe asthma attack bornchitis asthma  per patient, no issues since    Past Surgical History:  Procedure Laterality Date   BREAST LUMPECTOMY WITH RADIOACTIVE SEED AND SENTINEL LYMPH NODE BIOPSY Left 09/16/2019   Procedure: LEFT BREAST LUMPECTOMY WITH RADIOACTIVE SEED AND SENTINEL LYMPH NODE BIOPSY;  Surgeon: Belinda Cough, MD;  Location: [facility];  Service: General;  Laterality: Left;   CARDIOVASCULAR STRESS TEST  03/05/06   CARPAL TUNNEL RELEASE   2009    bilaterally   CATARACT EXTRACTION, BILATERAL     Dr Roz   COLONOSCOPY W/ POLYPECTOMY  1998   Tics @ 2004 & 2012; Dr Avram   HAND SURGERY     Trigger thumb   PARTIAL KNEE ARTHROPLASTY Left 04/03/2017   Procedure: Left knee medial unicompartmental arthroplasty;  Surgeon: Melodi Lerner, MD;  Location: [facility];  Service: Orthopedics;  Laterality: Left;  Adductor canal block   PORTACATH PLACEMENT N/A 09/29/2019   Procedure: INSERTION PORT-A-CATH;  Surgeon: Belinda Cough, MD;  Location: [facility];  Service: General;  Laterality: N/A;   TRIGGER FINGER RELEASE Right    TUBAL LIGATION      reports that she quit smoking about 53 years ago. Her smoking use included cigarettes. She has never used smokeless tobacco. She reports current alcohol use. She reports that she does not use drugs. family history includes Asthma in her sister; Colon polyps in her sister; Diverticulitis in her sister; Diverticulosis in her sister; Glaucoma in her sister; Heart attack in her father; Hypertension in her brother, father, mother, and sister; Lung cancer in her father, sister, and sister; Melanoma in her sister; Osteoporosis in her sister; Prostate cancer in her father; Skin cancer in her sister. Allergies  Allergen Reactions   Sulfonamide Derivatives  Rash    Rash Because of a history of documented adverse serious drug reaction;Medi Alert bracelet  is recommended   Tramadol Hcl Other (See Comments)    REACTION: cold sweats, weak, fatigue   Current Outpatient Medications on File Prior to Visit  Medication Sig Dispense Refill   acetaminophen  (TYLENOL ) 500 MG tablet Take 500-1,000 mg by mouth every 6 (six) hours as needed (for pain.).     aspirin 81 MG chewable tablet Chew 81 mg by mouth at bedtime.      benazepril  (LOTENSIN ) 20 MG tablet Take 1 tablet (20 mg total) by mouth daily. 90 tablet 3   bisoprolol -hydrochlorothiazide  (ZIAC ) 5-6.25 MG tablet Take 1 tablet by mouth daily. 90 tablet 3   calcium  carbonate (OSCAL) 1500 (600 Ca) MG TABS tablet Take 600 mg of elemental calcium by mouth 2 (two) times daily with a meal.     cholecalciferol (VITAMIN D3) 10 MCG (400 UNIT) TABS tablet Take 400 Units by mouth at bedtime.      ibuprofen (ADVIL) 200 MG tablet Take 400 mg by mouth every 8 (eight) hours as needed (pain.).      Multiple Vitamin (MULTIVITAMIN WITH MINERALS) TABS tablet Take 1 tablet by mouth daily. Centrum Silver     Omega-3 Fatty Acids (FISH OIL) 1000 MG CAPS Take 1,000 mg by mouth 2 (two) times daily.     pantoprazole  (PROTONIX ) 40 MG tablet Take 1 tablet (40 mg total) by mouth daily. 90 tablet 3   pravastatin  (PRAVACHOL ) 20 MG tablet Take 1 tablet (20 mg total) by mouth daily. 90 tablet 3   Propylene Glycol 0.6 % SOLN Place 1 drop 3 (three) times daily as needed into both eyes (dry eyes).     vitamin E 400 UNIT capsule Take 400 Units by mouth daily.     albuterol  (VENTOLIN  HFA) 108 (90 Base) MCG/ACT inhaler Inhale 2 puffs into the lungs every 6 (six) hours as needed for wheezing or shortness of breath. (Patient not taking: Reported on 04/23/2023) 8 g 0   No current facility-administered medications on file prior to visit.        ROS:  All others reviewed and negative.  Objective        PE:  BP 122/76 (BP Location: Right Arm, Patient Position: Sitting, Cuff Size: Normal)   Pulse 75   Temp 98.5 F (36.9 C) (Oral)   Ht 5' 1 (1.549 m)   Wt 225 lb (102.1 kg)   LMP  (LMP Unknown)   SpO2 94%   BMI 42.51 kg/m                 Constitutional: Pt appears in NAD               HENT: Head: NCAT.                Right Ear: External ear normal.                 Left Ear: External ear normal. Bilat tm's with mild erythema.  Max sinus areas mild tender.  Pharynx with mild erythema, no exudate               Eyes: . Pupils are equal, round, and reactive to light. Conjunctivae and EOM are normal               Nose: without d/c or deformity               Neck: Neck supple.  Gross normal ROM                Cardiovascular: Normal rate and regular rhythm.                 Pulmonary/Chest: Effort normal and breath sounds without rales or wheezing.                               Neurological: Pt is alert. At baseline orientation, motor grossly intact               Skin: Skin is warm. No rashes, no other new lesions, LE edema - none               Psychiatric: Pt behavior is normal without agitation   Micro: none  Cardiac tracings I have personally interpreted today:  none  Pertinent Radiological findings (summarize): none   Lab Results  Component Value Date   WBC 5.1 04/23/2023   HGB 13.6 04/23/2023   HCT 40.8 04/23/2023   PLT 186.0 04/23/2023   GLUCOSE 97 04/23/2023   CHOL 159 04/23/2023   TRIG 263.0 (H) 04/23/2023   HDL 46.70 04/23/2023   LDLDIRECT 89.0 04/20/2022   LDLCALC 59 04/23/2023   ALT 17 04/23/2023   AST 21 04/23/2023   NA 140 04/23/2023   K 4.6 04/23/2023   CL 101 04/23/2023   CREATININE 0.66 04/23/2023   BUN 18 04/23/2023   CO2 33 (H) 04/23/2023   TSH 1.56 02/05/2014   INR 1.03 03/27/2017   HGBA1C 5.9 04/23/2023   Assessment/Plan:  Kim Fuller is a 78 y.o. White or Caucasian [1] female with  has a past medical history of Abnormal finding on Pap smear, Allergy, Arthritis, Asthma, Cancer (HCC), Cataract, Diverticulosis, GERD (gastroesophageal reflux disease), Hyperlipidemia, Hypertension, Mitral valve prolapse, Osteopenia, and Reactive airway disease.  Essential hypertension BP Readings from Last 3 Encounters:  05/15/23 122/76  04/23/23 126/88  03/20/23 128/72   Stable, pt to continue medical treatment lotensin  20 gm every day, ziac  5 6.25 qd   Acute upper respiratory infection Mild to mod, for antibx course, zpack and cough med prn,  to f/u any worsening symptoms or concerns Followup: Return if symptoms worsen or fail to improve.  Lynwood Rush, MD 05/17/2023 8:21 PM [facility]

## 2023-05-17 ENCOUNTER — Encounter: Payer: Self-pay | Admitting: Internal Medicine

## 2023-05-17 DIAGNOSIS — J069 Acute upper respiratory infection, unspecified: Secondary | ICD-10-CM | POA: Insufficient documentation

## 2023-05-17 NOTE — Assessment & Plan Note (Signed)
 Mild to mod, for antibx course, zpack and cough med prn,  to f/u any worsening symptoms or concerns

## 2023-05-17 NOTE — Assessment & Plan Note (Signed)
 BP Readings from Last 3 Encounters:  05/15/23 122/76  04/23/23 126/88  03/20/23 128/72   Stable, pt to continue medical treatment lotensin 20 gm every day, ziac 5 6.25 qd

## 2023-05-20 ENCOUNTER — Ambulatory Visit: Payer: Medicare Other | Attending: Surgery

## 2023-05-20 VITALS — Wt 224.0 lb

## 2023-05-20 DIAGNOSIS — Z483 Aftercare following surgery for neoplasm: Secondary | ICD-10-CM | POA: Insufficient documentation

## 2023-05-20 NOTE — Therapy (Signed)
 OUTPATIENT PHYSICAL THERAPY SOZO SCREENING NOTE   Patient Name: Kim Fuller MRN: [id number] DOB:13-Nov-1945, 78 y.o., female Today's Date: 05/20/2023  PCP: Rollene Almarie LABOR, MD REFERRING PROVIDER: Belinda Cough, MD   PT End of Session - 05/20/23 1016     Visit Number 2   # unchanged due to screen only   PT Start Time 1014    PT Stop Time 1017    PT Time Calculation (min) 3 min    Activity Tolerance Patient tolerated treatment well    Behavior During Therapy WFL for tasks assessed/performed             Past Medical History:  Diagnosis Date   Abnormal finding on Pap smear    x1   Allergy    Arthritis    severe in back and left hip   Asthma    Cancer (HCC)    breast cancer (left), chemotherapy and radiation   Cataract    had surgery   Diverticulosis    GERD (gastroesophageal reflux disease)    Hyperlipidemia    Hypertension    Mitral valve prolapse    Osteopenia    -1.5 @ femoral neck 11/2007   Reactive airway disease    years ago ; severe asthma attack bornchitis asthma  per patient, no issues since    Past Surgical History:  Procedure Laterality Date   BREAST LUMPECTOMY WITH RADIOACTIVE SEED AND SENTINEL LYMPH NODE BIOPSY Left 09/16/2019   Procedure: LEFT BREAST LUMPECTOMY WITH RADIOACTIVE SEED AND SENTINEL LYMPH NODE BIOPSY;  Surgeon: Belinda Cough, MD;  Location: [facility];  Service: General;  Laterality: Left;   CARDIOVASCULAR STRESS TEST  03/05/06   CARPAL TUNNEL RELEASE  2009    bilaterally   CATARACT EXTRACTION, BILATERAL     Dr Roz   COLONOSCOPY W/ POLYPECTOMY  1998   Tics @ 2004 & 2012; Dr Avram   HAND SURGERY     Trigger thumb   PARTIAL KNEE ARTHROPLASTY Left 04/03/2017   Procedure: Left knee medial unicompartmental arthroplasty;  Surgeon: Melodi Lerner, MD;  Location: [facility];  Service: Orthopedics;  Laterality: Left;  Adductor canal block   PORTACATH PLACEMENT N/A 09/29/2019   Procedure: INSERTION PORT-A-CATH;   Surgeon: Belinda Cough, MD;  Location: [facility];  Service: General;  Laterality: N/A;   TRIGGER FINGER RELEASE Right    TUBAL LIGATION     Patient Active Problem List   Diagnosis Date Noted   Acute upper respiratory infection 05/17/2023   Greater trochanteric bursitis of left hip 02/07/2023   Trigger finger, left middle finger 10/05/2020   Acquired trigger finger of left ring finger 10/05/2020   Trigger finger, right ring finger 08/26/2020   HX: breast cancer 09/08/2019   Acute left-sided low back pain without sciatica 07/06/2019   Morbid obesity (HCC) 04/24/2019   Trigger finger, right middle finger 07/02/2017   Chronic shoulder bursitis, left 11/19/2016   OA (osteoarthritis) of knee 11/18/2015   Routine general medical examination at a health care facility 02/20/2015   GERD 12/31/2007   Osteopenia 10/31/2007   HYPERLIPIDEMIA 03/20/2007   Essential hypertension 03/20/2007    REFERRING DIAG: left breast cancer at risk for lymphedema  THERAPY DIAG: Aftercare following surgery for neoplasm  PERTINENT HISTORY: Patient was diagnosed on 08/19/2019 with left triple negative invasive ductal carcinoma breast cancer. It is grade III with a Ki67 of 50%. Patient reports she underwent a left lumpectomy and sentinel node biopsy (4 negative nodes) on 09/16/2019. She had  a partial left knee replacement in 2018 and some chronic low back pain.   PRECAUTIONS: left UE Lymphedema risk, None  SUBJECTIVE: Pt returns for her 6 month L-Dex screen.   PAIN:  Are you having pain? No  SOZO SCREENING: Patient was assessed today using the SOZO machine to determine the lymphedema index score. This was compared to her baseline score. It was determined that she is within the recommended range when compared to her baseline and no further action is needed at this time. She will continue SOZO screenings. These are done every 3 months for 2 years post operatively followed by every 6 months for 2 years, and then  annually. (Pt was due to begin 6 month screening but her change from baseline was much higher than usual so will continue with at least 1 more 3 month screen and pt was agreeable to this.)   L-DEX FLOWSHEETS - 05/20/23 1000       L-DEX LYMPHEDEMA SCREENING   Measurement Type Unilateral    L-DEX MEASUREMENT EXTREMITY Upper Extremity    POSITION  Standing    DOMINANT SIDE Right    At Risk Side Left    BASELINE SCORE (UNILATERAL) 6    L-DEX SCORE (UNILATERAL) 9.9    VALUE CHANGE (UNILAT) 3.9              Aden Berwyn Caldron, PTA 05/20/2023, 10:17 AM

## 2023-06-12 ENCOUNTER — Other Ambulatory Visit: Payer: Self-pay | Admitting: Obstetrics

## 2023-06-12 DIAGNOSIS — Z1231 Encounter for screening mammogram for malignant neoplasm of breast: Secondary | ICD-10-CM

## 2023-06-20 ENCOUNTER — Ambulatory Visit: Payer: Medicare Other | Admitting: Family Medicine

## 2023-07-31 DIAGNOSIS — C50912 Malignant neoplasm of unspecified site of left female breast: Secondary | ICD-10-CM | POA: Diagnosis not present

## 2023-09-02 ENCOUNTER — Ambulatory Visit: Payer: Medicare Other

## 2023-09-02 DIAGNOSIS — K080 Exfoliation of teeth due to systemic causes: Secondary | ICD-10-CM | POA: Diagnosis not present

## 2023-09-04 ENCOUNTER — Ambulatory Visit
Admission: RE | Admit: 2023-09-04 | Discharge: 2023-09-04 | Disposition: A | Discharge status: Home / Self Care | Source: Ambulatory Visit | Attending: Obstetrics

## 2023-09-04 DIAGNOSIS — Z1231 Encounter for screening mammogram for malignant neoplasm of breast: Secondary | ICD-10-CM

## 2023-09-24 DIAGNOSIS — H35373 Puckering of macula, bilateral: Secondary | ICD-10-CM | POA: Diagnosis not present

## 2023-11-11 NOTE — Assessment & Plan Note (Signed)
 09/16/2019: Left lumpectomy (Tsuei): IDC, grade 3, 0.9cm, with high grade DCIS, 4 left axillary lymph nodes negative.  Triple negative with a Ki-67 of 50%   Treatment plan: 1.  Adjuvant chemotherapy with CMF x6 completed 01/29/2020 2.  Adjuvant radiation therapy 02/26/2020-03/23/2020 ------------------------------------------------------------------------------------------------------------------------------------------------------------ Current Treatment: Surveillance Mammograms 09/06/2023: Benign breast density category B Breast exam 11/12/2023: Benign   CT chest abdomen pelvis in 10/20/2020: No evidence of Met disease, hepatic and renal cysts, bilateral kidney stones, diverticulosis Thyroid  nodule noted on CT scan: Thyroid  ultrasound: Benign   Severe chronic osteoarthritis: She gets injections into her hand as well as takes ibuprofen and Tylenol  for her back.   Return to clinic in 1 year for follow-up and after that she could be seen on an as-needed basis.

## 2023-11-12 ENCOUNTER — Inpatient Hospital Stay: Payer: Medicare Other | Attending: Hematology and Oncology | Admitting: Hematology and Oncology

## 2023-11-12 VITALS — BP 130/48 | HR 67 | Temp 97.7°F | Resp 18 | Ht 61.0 in | Wt 219.8 lb

## 2023-11-12 DIAGNOSIS — Z86000 Personal history of in-situ neoplasm of breast: Secondary | ICD-10-CM | POA: Diagnosis not present

## 2023-11-12 DIAGNOSIS — Z923 Personal history of irradiation: Secondary | ICD-10-CM | POA: Diagnosis not present

## 2023-11-12 DIAGNOSIS — Z9221 Personal history of antineoplastic chemotherapy: Secondary | ICD-10-CM | POA: Insufficient documentation

## 2023-11-12 DIAGNOSIS — Z853 Personal history of malignant neoplasm of breast: Secondary | ICD-10-CM | POA: Diagnosis not present

## 2023-11-12 NOTE — Progress Notes (Signed)
 Patient Care Team: Rollene Almarie LABOR, MD as PCP - General (Internal Medicine) Glean Stephane BROCKS, RN (Inactive) as Oncology Nurse Navigator Tyree Nanetta SAILOR, RN as Oncology Nurse Navigator Belinda Cough, MD as Consulting Physician (General Surgery) Odean Potts, MD as Consulting Physician (Hematology and Oncology) Dewey Rush, MD as Consulting Physician (Radiation Oncology) Roz Anes, MD as Consulting Physician (Ophthalmology) Rozella Toribio BROCKS, [facility] (Inactive) (Pharmacist)  DIAGNOSIS:  Encounter Diagnosis  Name Primary?   HX: breast cancer Yes    SUMMARY OF ONCOLOGIC HISTORY: Oncology History  HX: breast cancer  09/08/2019 Initial Diagnosis   Screening mammogram detected a left breast asymmetry. Diagnostic mammogram and US  showed a 0.5cm mass, 2 o'clock position, with no axillary adenopathy. Biopsy showed IDC, grade 3, HER-2 - (0), ER/PR -, Ki67 50%.   09/09/2019 Cancer Staging   Staging form: Breast, AJCC 8th Edition - Clinical stage from 09/09/2019: Stage IB (cT1a, cN0, cM0, G3, ER-, PR-, HER2-) - Signed by Odean Potts, MD on 09/09/2019   09/16/2019 Surgery   Left lumpectomy (Tsuei): IDC, grade 3, 0.9cm, with high grade DCIS, 4 left axillary lymph nodes negative.   09/16/2019 Cancer Staging   Staging form: Breast, AJCC 8th Edition - Pathologic stage from 09/16/2019: Stage IB (pT1b, pN0, cM0, G3, ER-, PR-, HER2-) - Signed by Crawford Morna Pickle, NP on 10/07/2019   10/15/2019 - 01/29/2020 Adjuvant Chemotherapy   CMF x 6   02/26/2020 - 03/23/2020 Radiation Therapy   Adjuvant left breast radiation     CHIEF COMPLIANT: Surveillance of breast cancer  HISTORY OF PRESENT ILLNESS:   History of Present Illness Kim Fuller is a 78 year old female with breast cancer who presents for follow-up regarding her arthritis and breast cancer surveillance.  She experiences severe arthritis in her back and hands, with significant stiffness and pain in both hands and hips. Symptoms  are most pronounced upon waking and improve with movement. She takes two Tylenol  and two ibuprofen at bedtime and uses Voltaren  cream for her hands.  Her most recent mammogram showed low breast density with no abnormalities. She is concerned about the possibility of cancer recurrence, particularly in the bones, due to a neighbor's experience.     ALLERGIES:  is allergic to sulfonamide derivatives and tramadol hcl.  MEDICATIONS:  Current Outpatient Medications  Medication Sig Dispense Refill   acetaminophen  (TYLENOL ) 500 MG tablet Take 500-1,000 mg by mouth every 6 (six) hours as needed (for pain.).     aspirin 81 MG chewable tablet Chew 81 mg by mouth at bedtime.      benazepril  (LOTENSIN ) 20 MG tablet Take 1 tablet (20 mg total) by mouth daily. 90 tablet 3   bisoprolol -hydrochlorothiazide  (ZIAC ) 5-6.25 MG tablet Take 1 tablet by mouth daily. 90 tablet 3   calcium carbonate (OSCAL) 1500 (600 Ca) MG TABS tablet Take 600 mg of elemental calcium by mouth 2 (two) times daily with a meal.     cholecalciferol (VITAMIN D3) 10 MCG (400 UNIT) TABS tablet Take 400 Units by mouth at bedtime.      ibuprofen (ADVIL) 200 MG tablet Take 400 mg by mouth every 8 (eight) hours as needed (pain.).      Multiple Vitamin (MULTIVITAMIN WITH MINERALS) TABS tablet Take 1 tablet by mouth daily. Centrum Silver     Omega-3 Fatty Acids (FISH OIL) 1000 MG CAPS Take 1,000 mg by mouth 2 (two) times daily.     pantoprazole  (PROTONIX ) 40 MG tablet Take 1 tablet (40 mg total) by  mouth daily. 90 tablet 3   pravastatin  (PRAVACHOL ) 20 MG tablet Take 1 tablet (20 mg total) by mouth daily. 90 tablet 3   Propylene Glycol 0.6 % SOLN Place 1 drop 3 (three) times daily as needed into both eyes (dry eyes).     vitamin E 400 UNIT capsule Take 400 Units by mouth daily.     No current facility-administered medications for this visit.    PHYSICAL EXAMINATION: ECOG PERFORMANCE STATUS: 1 - Symptomatic but completely ambulatory  Vitals:    11/12/23 1008  BP: (!) 130/48  Pulse: 67  Resp: 18  Temp: 97.7 F (36.5 C)  SpO2: 95%   Filed Weights   11/12/23 1008  Weight: 219 lb 12.8 oz (99.7 kg)    LABORATORY DATA:  I have reviewed the data as listed    Latest Ref Rng & Units 04/23/2023   10:33 AM 04/20/2022   10:21 AM 04/19/2021   10:54 AM  CMP  Glucose 70 - 99 mg/dL 97  89  84   BUN 6 - 23 mg/dL 18  22  15    Creatinine 0.40 - 1.20 mg/dL 9.33  9.31  9.26   Sodium 135 - 145 mEq/L 140  138  140   Potassium 3.5 - 5.1 mEq/L 4.6  4.5  4.1   Chloride 96 - 112 mEq/L 101  100  100   CO2 19 - 32 mEq/L 33  32  34   Calcium 8.4 - 10.5 mg/dL 9.5  9.6  89.9   Total Protein 6.0 - 8.3 g/dL 7.2  7.1  7.5   Total Bilirubin 0.2 - 1.2 mg/dL 0.7  0.6  0.7   Alkaline Phos 39 - 117 U/L 55  61  53   AST 0 - 37 U/L 21  18  21    ALT 0 - 35 U/L 17  15  20      Lab Results  Component Value Date   WBC 5.1 04/23/2023   HGB 13.6 04/23/2023   HCT 40.8 04/23/2023   MCV 102.2 (H) 04/23/2023   PLT 186.0 04/23/2023   NEUTROABS 3.5 10/20/2020    ASSESSMENT & PLAN:  HX: breast cancer 09/16/2019: Left lumpectomy (Tsuei): IDC, grade 3, 0.9cm, with high grade DCIS, 4 left axillary lymph nodes negative.  Triple negative with a Ki-67 of 50%   Treatment plan: 1.  Adjuvant chemotherapy with CMF x6 completed 01/29/2020 2.  Adjuvant radiation therapy 02/26/2020-03/23/2020 ------------------------------------------------------------------------------------------------------------------------------------------------------------ Current Treatment: Surveillance Mammograms 09/06/2023: Benign breast density category B Breast exam 11/12/2023: Benign   CT chest abdomen pelvis in 10/20/2020: No evidence of Met disease, hepatic and renal cysts, bilateral kidney stones, diverticulosis Thyroid  nodule noted on CT scan: Thyroid  ultrasound: Benign   Severe chronic osteoarthritis: She gets injections into her hand as well as takes ibuprofen and Tylenol  for her  back.   Return to clinic in 1 year for follow-up and after that she could be seen on an as-needed basis. ------------------------------------- Assessment and Plan Assessment & Plan Breast cancer Mammogram shows no recurrence, low breast density. Discussed recurrence concerns, especially in bones. Introduced new blood test for cancer DNA detection, covered by Medicare, detects recurrence earlier than scans, false positives possible. - Provide brochure about new blood test. - Discuss test with husband before proceeding. - Continue regular mammograms. - Schedule follow-up next year for status review   Severe chronic osteoarthritis Affects back, hands, hips with stiffness and pain, improves with movement. Uses Tylenol , ibuprofen, Voltaren  cream, and cane for mobility. -  Continue Tylenol  and ibuprofen as needed. - Continue Voltaren  cream for hand pain.       No orders of the defined types were placed in this encounter.  The patient has a good understanding of the overall plan. she agrees with it. she will call with any problems that may develop before the next visit here. Total time spent: 30 mins including face to face time and time spent for planning, charting and co-ordination of care   Naomi MARLA Chad, MD 11/12/23

## 2023-11-18 ENCOUNTER — Ambulatory Visit: Payer: Medicare Other | Attending: Surgery

## 2023-11-18 VITALS — Wt 222.0 lb

## 2023-11-18 DIAGNOSIS — Z483 Aftercare following surgery for neoplasm: Secondary | ICD-10-CM | POA: Insufficient documentation

## 2023-11-18 NOTE — Therapy (Signed)
 OUTPATIENT PHYSICAL THERAPY SOZO SCREENING NOTE   Patient Name: Kim Fuller MRN: [id number] DOB:[birth year], 78 y.o., female Today's Date: 11/18/2023  PCP: Rollene Almarie LABOR, MD REFERRING PROVIDER: Belinda Cough, MD   PT End of Session - 11/18/23 1015     Visit Number 2   # unchanged due to screen only   PT Start Time 1013    PT Stop Time 1017    PT Time Calculation (min) 4 min    Activity Tolerance Patient tolerated treatment well    Behavior During Therapy WFL for tasks assessed/performed          Past Medical History:  Diagnosis Date   Abnormal finding on Pap smear    x1   Allergy    Arthritis    severe in back and left hip   Asthma    Cancer (HCC)    breast cancer (left), chemotherapy and radiation   Cataract    had surgery   Diverticulosis    GERD (gastroesophageal reflux disease)    Hyperlipidemia    Hypertension    Mitral valve prolapse    Osteopenia    -1.5 @ femoral neck 11/2007   Reactive airway disease    years ago ; severe asthma attack bornchitis asthma  per patient, no issues since    Past Surgical History:  Procedure Laterality Date   BREAST LUMPECTOMY WITH RADIOACTIVE SEED AND SENTINEL LYMPH NODE BIOPSY Left 09/16/2019   Procedure: LEFT BREAST LUMPECTOMY WITH RADIOACTIVE SEED AND SENTINEL LYMPH NODE BIOPSY;  Surgeon: Belinda Cough, MD;  Location: [facility];  Service: General;  Laterality: Left;   CARDIOVASCULAR STRESS TEST  03/05/06   CARPAL TUNNEL RELEASE  2009    bilaterally   CATARACT EXTRACTION, BILATERAL     Dr Roz   COLONOSCOPY W/ POLYPECTOMY  1998   Tics @ 2004 & 2012; Dr Avram   HAND SURGERY     Trigger thumb   PARTIAL KNEE ARTHROPLASTY Left 04/03/2017   Procedure: Left knee medial unicompartmental arthroplasty;  Surgeon: Melodi Lerner, MD;  Location: [facility];  Service: Orthopedics;  Laterality: Left;  Adductor canal block   PORTACATH PLACEMENT N/A 09/29/2019   Procedure: INSERTION PORT-A-CATH;   Surgeon: Belinda Cough, MD;  Location: [facility];  Service: General;  Laterality: N/A;   TRIGGER FINGER RELEASE Right    TUBAL LIGATION     Patient Active Problem List   Diagnosis Date Noted   Acute upper respiratory infection 05/17/2023   Greater trochanteric bursitis of left hip 02/07/2023   Trigger finger, left middle finger 10/05/2020   Acquired trigger finger of left ring finger 10/05/2020   Trigger finger, right ring finger 08/26/2020   HX: breast cancer 09/08/2019   Acute left-sided low back pain without sciatica 07/06/2019   Morbid obesity (HCC) 04/24/2019   Trigger finger, right middle finger 07/02/2017   Chronic shoulder bursitis, left 11/19/2016   OA (osteoarthritis) of knee 11/18/2015   Routine general medical examination at a health care facility 02/20/2015   GERD 12/31/2007   Osteopenia 10/31/2007   HYPERLIPIDEMIA 03/20/2007   Essential hypertension 03/20/2007    REFERRING DIAG: left breast cancer at risk for lymphedema  THERAPY DIAG: Aftercare following surgery for neoplasm  PERTINENT HISTORY: Patient was diagnosed on 08/19/2019 with left triple negative invasive ductal carcinoma breast cancer. It is grade III with a Ki67 of 50%. Patient reports she underwent a left lumpectomy and sentinel node biopsy (4 negative nodes) on 09/16/2019. She had a partial left  knee replacement in 2018 and some chronic low back pain.   PRECAUTIONS: left UE Lymphedema risk, None  SUBJECTIVE: Pt returns for her 6 month L-Dex screen.   PAIN:  Are you having pain? No  SOZO SCREENING: Patient was assessed today using the SOZO machine to determine the lymphedema index score. This was compared to her baseline score. It was determined that she is within the recommended range when compared to her baseline and no further action is needed at this time. She will continue SOZO screenings. These are done every 3 months for 2 years post operatively followed by every 6 months for 2 years, and then  annually. (Pt was due to begin 6 month screening but her change from baseline was much higher than usual so will continue with at least 1 more 3 month screen and pt was agreeable to this.)   L-DEX FLOWSHEETS - 11/18/23 1000       L-DEX LYMPHEDEMA SCREENING   Measurement Type Unilateral    L-DEX MEASUREMENT EXTREMITY Upper Extremity    POSITION  Standing    DOMINANT SIDE Right    At Risk Side Left    BASELINE SCORE (UNILATERAL) 6    L-DEX SCORE (UNILATERAL) 9.3    VALUE CHANGE (UNILAT) 3.3         P: Begin annual screens.   Aden Berwyn Caldron, PTA 11/18/2023, 10:16 AM

## 2024-02-28 ENCOUNTER — Encounter: Payer: Self-pay | Admitting: Internal Medicine

## 2024-02-28 ENCOUNTER — Ambulatory Visit (INDEPENDENT_AMBULATORY_CARE_PROVIDER_SITE_OTHER): Admitting: Internal Medicine

## 2024-02-28 VITALS — BP 136/70 | HR 78 | Temp 97.8°F | Ht 61.0 in | Wt 220.2 lb

## 2024-02-28 DIAGNOSIS — M545 Low back pain, unspecified: Secondary | ICD-10-CM | POA: Diagnosis not present

## 2024-02-28 DIAGNOSIS — L989 Disorder of the skin and subcutaneous tissue, unspecified: Secondary | ICD-10-CM

## 2024-02-28 DIAGNOSIS — M65342 Trigger finger, left ring finger: Secondary | ICD-10-CM | POA: Diagnosis not present

## 2024-02-28 DIAGNOSIS — G8929 Other chronic pain: Secondary | ICD-10-CM | POA: Diagnosis not present

## 2024-02-28 NOTE — Progress Notes (Signed)
   Subjective:   Patient ID: Kim Fuller, female    DOB: 1945/11/06, 78 y.o.   MRN: [id number]  Discussed the use of AI scribe software for clinical note transcription with the patient, who gave verbal consent to proceed.  History of Present Illness Kim Fuller is a 78 year old female with a history of breast cancer who presents with a new growth on her left breast.  She has a tan-colored, mole-like lesion that is slightly drying out, but it is located on her breast. It is not painful and does not get caught on clothing. She is vigilant about monitoring any changes due to her history of breast cancer on the other breast.  She experiences chronic back and leg pain, which she manages with Tylenol  and ibuprofen, primarily at bedtime. She uses a cane for support. She avoids frequent use of pain medication to prevent dependency and is cautious about side effects. She has tried topical treatments like Voltaren  for pain relief.  Review of Systems  Constitutional: Negative.   HENT: Negative.    Eyes: Negative.   Respiratory:  Negative for cough, chest tightness and shortness of breath.   Cardiovascular:  Negative for chest pain, palpitations and leg swelling.  Gastrointestinal:  Negative for abdominal distention, abdominal pain, constipation, diarrhea, nausea and vomiting.  Musculoskeletal:  Positive for arthralgias and back pain.  Skin: Negative.   Neurological: Negative.   Psychiatric/Behavioral: Negative.      Objective:  Physical Exam Constitutional:      Appearance: She is well-developed.  HENT:     Head: Normocephalic and atraumatic.  Cardiovascular:     Rate and Rhythm: Normal rate and regular rhythm.     Comments: Mole right breast at midline with tan portion and some darker area. About 3 mm circular Pulmonary:     Effort: Pulmonary effort is normal. No respiratory distress.     Breath sounds: Normal breath sounds. No wheezing or rales.  Abdominal:     General:  Bowel sounds are normal. There is no distension.     Palpations: Abdomen is soft.     Tenderness: There is no abdominal tenderness.  Musculoskeletal:        General: Tenderness present.     Cervical back: Normal range of motion.  Skin:    General: Skin is warm and dry.  Neurological:     Mental Status: She is alert and oriented to person, place, and time.     Coordination: Coordination normal.     Vitals:   02/28/24 0958 02/28/24 1002  BP: (!) 142/70 136/70  Pulse: 78   Temp: 97.8 F (36.6 C)   TempSrc: Oral   SpO2: 92%   Weight: 220 lb 3.2 oz (99.9 kg)   Height: 5' 1 (1.549 m)     Assessment and Plan Assessment & Plan Benign skin lesion of right breast (monitoring)   A benign lesion on the right breast shows no signs of malignancy. Due to her breast cancer history, monitor the lesion for any changes in size, color, or texture. Re-evaluate the lesion in December.  Chronic back pain   Her chronic back pain worsens in cold weather. She uses a cane and takes Tylenol  and ibuprofen, preferring minimal medication due to side effect concerns. Topical treatments have been effective. Recommend lidocaine  patches for pain management. Continue Tylenol  and ibuprofen as needed, primarily at bedtime. Consider Voltaren  gel for additional pain relief.

## 2024-02-28 NOTE — Assessment & Plan Note (Signed)
 Will be seeing sports med soon for injection which typically works well for her.

## 2024-02-28 NOTE — Assessment & Plan Note (Signed)
 A benign lesion on the right breast shows no signs of malignancy. Due to her breast cancer history, monitor the lesion for any changes in size, color, or texture. Re-evaluate the lesion in December.

## 2024-02-28 NOTE — Assessment & Plan Note (Signed)
 Her chronic back pain worsens in cold weather. She uses a cane and takes Tylenol  and ibuprofen, preferring minimal medication due to side effect concerns. Topical treatments have been effective. Recommend lidocaine  patches for pain management. Continue Tylenol  and ibuprofen as needed, primarily at bedtime. Consider Voltaren  gel for additional pain relief

## 2024-03-09 ENCOUNTER — Ambulatory Visit: Payer: Self-pay

## 2024-03-09 NOTE — Telephone Encounter (Signed)
 FYI Only or Action Required?: FYI only for provider: appointment scheduled on 03/11/24.  Patient was last seen in primary care on 02/28/2024 by Rollene Almarie LABOR, MD.  Called Nurse Triage reporting Hip Pain.  Symptoms began a week ago.  Interventions attempted: OTC medications: tylenol , motrin, salonpas and Rest, hydration, or home remedies.  Symptoms are: gradually worsening.  Triage Disposition: See PCP When Office is Open (Within 3 Days)  Patient/caregiver understands and will follow disposition?: Yes    Copied from CRM [id number]. Topic: Clinical - Red Word Triage >> Mar 09, 2024  1:25 PM Eva FALCON wrote: Red Word that prompted transfer to Nurse Triage: Issues with right hip, states it hurts to walk and put pressure, is using her cane help and is having back pain. Reason for Disposition  [1] MODERATE pain (e.g., interferes with normal activities, limping) AND [2] present > 3 days  Answer Assessment - Initial Assessment Questions Additional info: has tried Apap, ibuprofen, salonpas without much relief. No injuries. She notes she has arthritis is other parts of her body and wondering if this is also arthritis.    1. LOCATION and RADIATION: Where is the pain located? Does the pain spread (shoot) anywhere else?     Right hip  2. QUALITY: What does the pain feel like?  (e.g., sharp, dull, aching, burning)     sharp 3. SEVERITY: How bad is the pain? What does it keep you from doing?   (Scale 1-10; or mild, moderate, severe)     Worse with walking and laying on right side 4. ONSET: When did the pain start? Does it come and go, or is it there all the time?     One week  5. WORK OR EXERCISE: Has there been any recent work or exercise that involved this part of the body?      Denies  6. CAUSE: What do you think is causing the hip pain?      Arthritis  7. AGGRAVATING FACTORS: What makes the hip pain worse? (e.g., walking, climbing stairs, running)     Walking,  laying on side, pressure  8. OTHER SYMPTOMS: Do you have any other symptoms? (e.g., back pain, pain shooting down leg,  fever, rash)     Lower back  Protocols used: Hip Pain-A-AH

## 2024-03-11 ENCOUNTER — Encounter: Payer: Self-pay | Admitting: Internal Medicine

## 2024-03-11 ENCOUNTER — Ambulatory Visit: Admitting: Internal Medicine

## 2024-03-11 VITALS — BP 138/74 | HR 64 | Temp 98.7°F | Ht 61.0 in | Wt 221.0 lb

## 2024-03-11 DIAGNOSIS — G5701 Lesion of sciatic nerve, right lower limb: Secondary | ICD-10-CM

## 2024-03-11 DIAGNOSIS — I10 Essential (primary) hypertension: Secondary | ICD-10-CM

## 2024-03-11 NOTE — Patient Instructions (Signed)
 Try using a heating pad.   Try stretching the muscle    Piriformis Syndrome Rehab Ask your health care provider which exercises are safe for you. Do exercises exactly as told by your health care provider and adjust them as directed. It is normal to feel mild stretching, pulling, tightness, or discomfort as you do these exercises. Stop right away if you feel sudden pain or your pain gets worse. Do not begin these exercises until told by your health care provider. Stretching and range-of-motion exercises These exercises warm up your muscles and joints and improve the movement and flexibility of your hip and pelvis. The exercises also help to relieve pain, numbness, and tingling. Nerve root  Sit on a firm surface that is high enough that you can swing your left / right foot freely. Place a folded towel under your left / right thigh. This is optional. Drop your head forward and round your back. While you keep your left / right foot relaxed, slowly straighten your left / right knee until you feel a slight pull behind your knee or calf. If your leg is fully extended and you still do not feel a pull, slowly tilt your foot and toes toward you. Hold this position for __________ seconds. Slowly return your knee to its starting position. Hip rotation This is an exercise in which you lie on your back and stretch the muscles that rotate your hip (hip rotators) to stretch your buttocks. Lie on your back on a firm surface. Pull your left / right knee toward your same shoulder with your left / right hand until your knee is pointing toward the ceiling. Hold your left / right ankle with your other hand. Keeping your knee steady, gently pull your left / right ankle toward your other shoulder until you feel a stretch in your buttocks. Hold this position for __________ seconds. Repeat __________ times. Complete this exercise __________ times a day. Hip extensor This is an exercise in which you lie on your  back and pull your knee to your chest. Lie on your back on a firm surface. Both of your legs should be straight. Pull your left / right knee to your chest. Hold your leg in this position by holding on to the back of your thigh or the front of your knee. Hold this position for __________ seconds. Slowly return to the starting position. Repeat __________ times. Complete this exercise __________ times a day. Strengthening exercises These exercises build strength and endurance in your hip and thigh muscles. Endurance is the ability to use your muscles for a long time, even after they get tired. Straight leg raises, side-lying This exercise strengthens the muscles that rotate the leg at the hip and move it away from your body (hip abductors). Lie on your side with your left / right leg in the top position. Lie so your head, shoulder, knee, and hip line up. Bend your bottom knee to help you balance. Lift your top leg 4-6 inches (10-15 cm) while keeping your toes pointed straight ahead. Hold this position for __________ seconds. Slowly lower your leg to the starting position. Let your muscles relax completely after each repetition. Repeat __________ times. Complete this exercise __________ times a day. Hip abduction and rotation This is sometimes called quadruped (on hands and knees) exercises. Get on your hands and knees on a firm, lightly padded surface. Your hands should be directly below your shoulders, and your knees should be directly below your hips. Lift your  left / right knee out to the side. Keep your knee bent. Do not twist your body. Hold this position for __________ seconds. Slowly lower your leg. Repeat __________ times. Complete this exercise __________ times a day. Straight leg raises, prone This exercise stretches the muscles that move the hips (hip extensors). Lie on your abdomen on a firm surface (prone position). Tense the muscles in your buttocks and lift your left / right leg  about 4 inches (10 cm). Keep your knee straight as you lift your leg. If you cannot lift your leg that high without arching your back, place a pillow under your hips. Hold this position for __________ seconds. Slowly lower your leg to the starting position. Let your muscles relax completely after each repetition. Repeat __________ times. Complete this exercise __________ times a day. This information is not intended to replace advice given to you by your health care provider. Make sure you discuss any questions you have with your health care provider. Document Revised: 10/25/2020 Document Reviewed: 10/25/2020 Elsevier Patient Education  2024 [employer].

## 2024-03-11 NOTE — Progress Notes (Signed)
 Subjective:    Patient ID: Sharyne Court Sic, female    DOB: 24-Feb-1946, 78 y.o.   MRN: [id number]      HPI Onna is here for  Chief Complaint  Patient presents with   Hip Pain    Left hip pain  Correction-pain is in the right hip, not the left  Discussed the use of AI scribe software for clinical note transcription with the patient, who gave verbal consent to proceed.  History of Present Illness Shadiamond Koska is a 78 year old female with severe arthritis who presents with worsening right hip pain.  She has been experiencing worsening right hip pain over the past week, with a significant increase in intensity on the day she called for the appointment. The pain is described as a burning sensation, primarily located in the middle of the right buttock, and worsens with movement, particularly when walking or lying on her side. No radiation of pain down the leg, numbness, or tingling is noted.  Her medical history includes severe arthritis in her back and a history of hip pain. About a year ago, she was treated for bursitis in the left hip with an injection, which provided relief.   For pain management, she has been using Voltaren  gel and lidocaine  patches, which have not provided significant relief. She also takes two Tylenol  and two ibuprofen together or eight-hour arthritis Tylenol  at night, which helps her sleep. She has been trying to limit her activity, particularly avoiding stairs, and finds some relief by resting in a recliner.      Medications and allergies reviewed with patient and updated if appropriate.  Current Outpatient Medications on File Prior to Visit  Medication Sig Dispense Refill   acetaminophen  (TYLENOL ) 500 MG tablet Take 500-1,000 mg by mouth every 6 (six) hours as needed (for pain.).     aspirin 81 MG chewable tablet Chew 81 mg by mouth at bedtime.      benazepril  (LOTENSIN ) 20 MG tablet Take 1 tablet (20 mg total) by mouth daily. 90 tablet 3    bisoprolol -hydrochlorothiazide  (ZIAC ) 5-6.25 MG tablet Take 1 tablet by mouth daily. 90 tablet 3   calcium carbonate (OSCAL) 1500 (600 Ca) MG TABS tablet Take 600 mg of elemental calcium by mouth 2 (two) times daily with a meal.     cholecalciferol (VITAMIN D3) 10 MCG (400 UNIT) TABS tablet Take 400 Units by mouth at bedtime.      ibuprofen (ADVIL) 200 MG tablet Take 400 mg by mouth every 8 (eight) hours as needed (pain.).      Multiple Vitamin (MULTIVITAMIN WITH MINERALS) TABS tablet Take 1 tablet by mouth daily. Centrum Silver     Omega-3 Fatty Acids (FISH OIL) 1000 MG CAPS Take 1,000 mg by mouth 2 (two) times daily.     pantoprazole  (PROTONIX ) 40 MG tablet Take 1 tablet (40 mg total) by mouth daily. 90 tablet 3   pravastatin  (PRAVACHOL ) 20 MG tablet Take 1 tablet (20 mg total) by mouth daily. 90 tablet 3   Propylene Glycol 0.6 % SOLN Place 1 drop 3 (three) times daily as needed into both eyes (dry eyes).     vitamin E 400 UNIT capsule Take 400 Units by mouth daily.     No current facility-administered medications on file prior to visit.    Review of Systems     Objective:   Vitals:   03/11/24 1526  BP: 138/74  Pulse: 64  Temp: 98.7 F (37.1  C)  SpO2: 91%   BP Readings from Last 3 Encounters:  03/11/24 138/74  02/28/24 136/70  11/12/23 (!) 130/48   Wt Readings from Last 3 Encounters:  03/11/24 221 lb (100.2 kg)  02/28/24 220 lb 3.2 oz (99.9 kg)  11/18/23 222 lb (100.7 kg)   Body mass index is 41.76 kg/m.    Physical Exam Constitutional:      General: She is not in acute distress.    Appearance: Normal appearance. She is not ill-appearing.  Musculoskeletal:        General: Tenderness (Right mid buttock.  No tenderness with palpation lateral right trochanter) present.  Skin:    General: Skin is warm and dry.     Findings: No erythema or rash.  Neurological:     Mental Status: She is alert.            Assessment & Plan:    Assessment and Plan Assessment  & Plan Right hip and buttock pain likely due to piriformis syndrome Pain localized to buttock, worsened by walking and lying on affected side, likely piriformis syndrome.  - Advised heating pad for pain relief. - Recommended piriformis stretching exercises. -Continue Tylenol  arthritis or Tylenol  with ibuprofen at night for pain - Instructed to avoid prolonged sitting. - Will discuss with Dr Claudene if pain persists.  Hypertension Chronic.  Blood pressure overall controlled over the past several readings - Continue current medications-benazepril  20 mg daily, bisoprolol - HCTZ 5-6.25 mg daily

## 2024-03-24 NOTE — Progress Notes (Unsigned)
 Kim Fuller [facility] [address] [place] [address] Phone: [phone] Subjective:   ISusannah Fuller, am serving as a scribe for Dr. Arthea Claudene.  I'm seeing this patient by the request  of:  Rollene Almarie LABOR, MD  CC: Left hand pain follow-up  YEP:Dlagzrupcz  Kim Fuller is a 78 y.o. female coming in with complaint of L hand pain. Last seen in Nov 2024 for hip pain. Patient states here for injection in 3rd and 4th finger on L hand.      Past Medical History:  Diagnosis Date   Abnormal finding on Pap smear    x1   Allergy    Arthritis    severe in back and left hip   Asthma    Cancer (HCC)    breast cancer (left), chemotherapy and radiation   Cataract    had surgery   Diverticulosis    GERD (gastroesophageal reflux disease)    Hyperlipidemia    Hypertension    Mitral valve prolapse    Osteopenia    -1.5 @ femoral neck 11/2007   Reactive airway disease    years ago ; severe asthma attack bornchitis asthma  per patient, no issues since    Past Surgical History:  Procedure Laterality Date   BREAST LUMPECTOMY WITH RADIOACTIVE SEED AND SENTINEL LYMPH NODE BIOPSY Left 09/16/2019   Procedure: LEFT BREAST LUMPECTOMY WITH RADIOACTIVE SEED AND SENTINEL LYMPH NODE BIOPSY;  Surgeon: Belinda Cough, MD;  Location: [facility];  Service: General;  Laterality: Left;   CARDIOVASCULAR STRESS TEST  03/05/06   CARPAL TUNNEL RELEASE  2009    bilaterally   CATARACT EXTRACTION, BILATERAL     Dr Roz   COLONOSCOPY W/ POLYPECTOMY  1998   Tics @ 2004 & 2012; Dr Avram   HAND SURGERY     Trigger thumb   PARTIAL KNEE ARTHROPLASTY Left 04/03/2017   Procedure: Left knee medial unicompartmental arthroplasty;  Surgeon: Melodi Lerner, MD;  Location: [facility];  Service: Orthopedics;  Laterality: Left;  Adductor canal block   PORTACATH PLACEMENT N/A 09/29/2019   Procedure: INSERTION PORT-A-CATH;  Surgeon: Belinda Cough, MD;  Location: [facility];  Service: General;  Laterality: N/A;   TRIGGER FINGER RELEASE Right    TUBAL LIGATION     Social History   Socioeconomic History   Marital status: Married    Spouse name: Not on file   Number of children: 2   Years of education: Not on file   Highest education level: Not on file  Occupational History   Occupation: retired  Tobacco Use   Smoking status: Former    Current packs/day: 0.00    Types: Cigarettes    Quit date: 05/07/1970    Years since quitting: 53.9   Smokeless tobacco: Never   Tobacco comments:    Smoked as a teen  1962-1972,only up to 3 cigarettes/ day  Vaping Use   Vaping status: Never Used  Substance and Sexual Activity   Alcohol use: Yes    Comment: occas   Drug use: No   Sexual activity: Not Currently  Other Topics Concern   Not on file  Social History Narrative   Not on file   Social Drivers of Health   Financial Resource Strain: Patient Declined (04/21/2023)   Overall Financial Resource Strain (CARDIA)    Difficulty of Paying Living Expenses: Patient declined  Food Insecurity: No Food Insecurity (04/23/2022)   Hunger Vital Sign  Worried About Programme Researcher, Broadcasting/film/video in the Last Year: Never true    Ran Out of Food in the Last Year: Never true  Transportation Needs: No Transportation Needs (04/21/2023)   PRAPARE - Administrator, Civil Service (Medical): No    Lack of Transportation (Non-Medical): No  Physical Activity: Insufficiently Active (04/21/2023)   Exercise Vital Sign    Days of Exercise per Week: 3 days    Minutes of Exercise per Session: 10 min  Stress: Patient Declined (04/21/2023)   [employer] of Occupational Health - Occupational Stress Questionnaire    Feeling of Stress : Patient declined  Social Connections: Unknown (04/21/2023)   Social Connection and Isolation Panel    Frequency of Communication with Friends and Family: Patient declined    Frequency of Social Gatherings with Friends and Family: Patient  declined    Attends Religious Services: Not on Marketing Executive or Organizations: Patient declined    Attends Banker Meetings: Patient declined    Marital Status: Married   Allergies  Allergen Reactions   Sulfonamide Derivatives Rash    Rash Because of a history of documented adverse serious drug reaction;Medi Alert bracelet  is recommended   Tramadol Hcl Other (See Comments)    REACTION: cold sweats, weak, fatigue   Family History  Problem Relation Age of Onset   Asthma Sister    Hypertension Sister    Hypertension Father    Heart attack Father        MI > 30   Prostate cancer Father    Lung cancer Father    Lung cancer Sister        smoker   Melanoma Sister    Osteoporosis Sister    Glaucoma Sister        X 2   Diverticulosis Sister        2 sisters   Lung cancer Sister    Colon polyps Sister        2 sisters with polyps   Diverticulitis Sister        1 sister   Hypertension Mother    Hypertension Brother    Skin cancer Sister    Diabetes Neg Hx    Stroke Neg Hx    Breast cancer Neg Hx    Colon cancer Neg Hx    Esophageal cancer Neg Hx    Stomach cancer Neg Hx      Current Outpatient Medications (Cardiovascular):    benazepril  (LOTENSIN ) 20 MG tablet, Take 1 tablet (20 mg total) by mouth daily.   bisoprolol -hydrochlorothiazide  (ZIAC ) 5-6.25 MG tablet, Take 1 tablet by mouth daily.   pravastatin  (PRAVACHOL ) 20 MG tablet, Take 1 tablet (20 mg total) by mouth daily.   Current Outpatient Medications (Analgesics):    acetaminophen  (TYLENOL ) 500 MG tablet, Take 500-1,000 mg by mouth every 6 (six) hours as needed (for pain.).   aspirin 81 MG chewable tablet, Chew 81 mg by mouth at bedtime.    ibuprofen (ADVIL) 200 MG tablet, Take 400 mg by mouth every 8 (eight) hours as needed (pain.).    Current Outpatient Medications (Other):    calcium carbonate (OSCAL) 1500 (600 Ca) MG TABS tablet, Take 600 mg of elemental calcium by mouth 2  (two) times daily with a meal.   cholecalciferol (VITAMIN D3) 10 MCG (400 UNIT) TABS tablet, Take 400 Units by mouth at bedtime.    Multiple Vitamin (MULTIVITAMIN WITH MINERALS) TABS tablet, Take 1  tablet by mouth daily. Centrum Silver   Omega-3 Fatty Acids (FISH OIL) 1000 MG CAPS, Take 1,000 mg by mouth 2 (two) times daily.   pantoprazole  (PROTONIX ) 40 MG tablet, Take 1 tablet (40 mg total) by mouth daily.   Propylene Glycol 0.6 % SOLN, Place 1 drop 3 (three) times daily as needed into both eyes (dry eyes).   vitamin E 400 UNIT capsule, Take 400 Units by mouth daily.   Reviewed prior external information including notes and imaging from  primary care provider As well as notes that were available from care everywhere and other healthcare systems.  Past medical history, social, surgical and family history all reviewed in electronic medical record.  No pertanent information unless stated regarding to the chief complaint.   Review of Systems:  No headache, visual changes, nausea, vomiting, diarrhea, constipation, dizziness, abdominal pain, skin rash, fevers, chills, night sweats, weight loss, swollen lymph nodes, body aches, joint swelling, chest pain, shortness of breath, mood changes. POSITIVE muscle aches  Objective  Blood pressure (!) 108/58, pulse 67, height 5' 1 (1.549 m), weight 220 lb (99.8 kg), SpO2 96%.   General: No apparent distress alert and oriented x3 mood and affect normal, dressed appropriately.  HEENT: Pupils equal, extraocular movements intact  Respiratory: Patient's speak in full sentences and does not appear short of breath  Cardiovascular: No lower extremity edema, non tender, no erythema  Left hand exam shows tender to palpation to over the A2 pulley of the ring finger.  Tender to palpation in the area.  Procedure: Real-time Ultrasound Guided Injection of left flexor tendon sheath of the ring finger Device: GE Logiq Q7 Ultrasound guided injection is preferred based  studies that show increased duration, increased effect, greater accuracy, decreased procedural pain, increased response rate, and decreased cost with ultrasound guided versus blind injection.  Verbal informed consent obtained.  Time-out conducted.  Noted no overlying erythema, induration, or other signs of local infection.  Skin prepped in a sterile fashion.  Local anesthesia: Topical Ethyl chloride.  With sterile technique and under real time ultrasound guidance: With a 25-gauge half inch needle injected with 0.5 cc of 0.5% Marcaine  and 0.5 cc of Kenalog 40 mg/mL Completed without difficulty  Pain immediately resolved suggesting accurate placement of the medication.  Advised to call if fevers/chills, erythema, induration, drainage, or persistent bleeding.  Images saved Impression: Technically successful ultrasound guided injection.  Procedure: Real-time Ultrasound Guided Injection of left flexor tendon sheath of the middle finger Device: GE Logiq Q7 Ultrasound guided injection is preferred based studies that show increased duration, increased effect, greater accuracy, decreased procedural pain, increased response rate, and decreased cost with ultrasound guided versus blind injection.  Verbal informed consent obtained.  Time-out conducted.  Noted no overlying erythema, induration, or other signs of local infection.  Skin prepped in a sterile fashion.  Local anesthesia: Topical Ethyl chloride.  With sterile technique and under real time ultrasound guidance: With a 25-gauge half inch needle injected with 0.5 cc of 0.5% Marcaine  and 0.5 cc of Kenalog this was in the tendon sheath Completed without difficulty  Pain immediately resolved suggesting accurate placement of the medication.  Advised to call if fevers/chills, erythema, induration, drainage, or persistent bleeding.  Images saved Impression: Technically successful ultrasound guided injection.    Impression and Recommendations:     The above documentation has been reviewed and is accurate and complete Anacleto Batterman M Taelor Waymire, DO

## 2024-03-25 ENCOUNTER — Encounter: Payer: Self-pay | Admitting: Family Medicine

## 2024-03-25 ENCOUNTER — Other Ambulatory Visit: Payer: Self-pay

## 2024-03-25 ENCOUNTER — Ambulatory Visit: Admitting: Family Medicine

## 2024-03-25 VITALS — BP 108/58 | HR 67 | Ht 61.0 in | Wt 220.0 lb

## 2024-03-25 DIAGNOSIS — M65342 Trigger finger, left ring finger: Secondary | ICD-10-CM | POA: Diagnosis not present

## 2024-03-25 DIAGNOSIS — M65332 Trigger finger, left middle finger: Secondary | ICD-10-CM

## 2024-03-25 NOTE — Assessment & Plan Note (Signed)
 Patient given injection again today.  Has done well for many years.  Hopeful that this will make another significant improvement.  Discussed icing regimen and home exercises, increase activity slowly.  Follow-up again in 6 to 12 weeks

## 2024-03-25 NOTE — Patient Instructions (Addendum)
 Injection in trigger fingers See you again in 3- 6 months Good to see you! Happy Holidays

## 2024-03-25 NOTE — Assessment & Plan Note (Signed)
 Chronic problem with exacerbation.  Given another injection.  Has been over a year since we have had to inject this 1.  Hopefully will continue.  Patient is on her birthday and we wish her happy birthday follow-up as needed

## 2024-03-26 DIAGNOSIS — K080 Exfoliation of teeth due to systemic causes: Secondary | ICD-10-CM | POA: Diagnosis not present

## 2024-04-01 DIAGNOSIS — K080 Exfoliation of teeth due to systemic causes: Secondary | ICD-10-CM | POA: Diagnosis not present

## 2024-04-08 DIAGNOSIS — K080 Exfoliation of teeth due to systemic causes: Secondary | ICD-10-CM | POA: Diagnosis not present

## 2024-04-23 ENCOUNTER — Ambulatory Visit

## 2024-04-23 VITALS — BP 130/70 | HR 55 | Ht 60.75 in | Wt 221.2 lb

## 2024-04-23 DIAGNOSIS — Z0000 Encounter for general adult medical examination without abnormal findings: Secondary | ICD-10-CM | POA: Diagnosis not present

## 2024-04-23 DIAGNOSIS — M8580 Other specified disorders of bone density and structure, unspecified site: Secondary | ICD-10-CM

## 2024-04-23 NOTE — Patient Instructions (Addendum)
 Kim Fuller,  Thank you for taking the time for your Medicare Wellness Visit. I appreciate your continued commitment to your health goals. Please review the care plan we discussed, and feel free to reach out if I can assist you further.  Please note that Annual Wellness Visits do not include a physical exam. Some assessments may be limited, especially if the visit was conducted virtually. If needed, we may recommend an in-person follow-up with your provider.  Ongoing Care Seeing your primary care provider every 3 to 6 months helps us  monitor your health and provide consistent, personalized care. Next office visit on 05/05/2024.  Each day, aim for 6 glasses of water , plenty of protein in your diet and try to get up and walk/ stretch every hour for 5-10 minutes at a time.    Referrals If a referral was made during today's visit and you haven't received any updates within two weeks, please contact the referred provider directly to check on the status. You have an order for:  [x]   Bone Density     Please call for appointment: [facility] [address] [place], [place] [address] [phone]   Make sure to wear two-piece clothing.  No lotions, powders, or deodorants the day of the appointment. Make sure to bring picture ID and insurance card.  Bring list of medications you are currently taking including any supplements.    Recommended Screenings:  Health Maintenance  Topic Date Due   COVID-19 Vaccine (7 - 2025-26 season) 03/17/2024   Medicare Annual Wellness Visit  04/25/2024   Breast Cancer Screening  09/03/2024   DTaP/Tdap/Td vaccine (4 - Td or Tdap) 12/27/2031   Pneumococcal Vaccine for age over 60  Completed   Flu Shot  Completed   Osteoporosis screening with Bone Density Scan  Completed   Hepatitis C Screening  Completed   Zoster (Shingles) Vaccine  Completed   Meningitis B Vaccine  Aged Out   Colon Cancer Screening  Discontinued       11/12/2023    10:12 AM  Advanced Directives  Would patient like information on creating a medical advance directive? No - Patient declined    Vision: Annual vision screenings are recommended for early detection of glaucoma, cataracts, and diabetic retinopathy. These exams can also reveal signs of chronic conditions such as diabetes and high blood pressure.  Dental: Annual dental screenings help detect early signs of oral cancer, gum disease, and other conditions linked to overall health, including heart disease and diabetes.  Please see the attached documents for additional preventive care recommendations.

## 2024-04-23 NOTE — Progress Notes (Signed)
 Chief Complaint  Patient presents with   Medicare Wellness     Subjective:   Kim Fuller is a 78 y.o. female who presents for a Medicare Annual Wellness Visit.  Visit info / Clinical Intake: Medicare Wellness Visit Type:: Subsequent Annual Wellness Visit Persons participating in visit and providing information:: patient Medicare Wellness Visit Mode:: In-person (required for WTM) Interpreter Needed?: No Pre-visit prep was completed: yes AWV questionnaire completed by patient prior to visit?: no Living arrangements:: lives with spouse/significant other Patient's Overall Health Status Rating: (!) fair Typical amount of pain: none Does pain affect daily life?: no Are you currently prescribed opioids?: no  Dietary Habits and Nutritional Risks How many meals a day?: 3 Eats fruit and vegetables daily?: yes Most meals are obtained by: preparing own meals In the last 2 weeks, have you had any of the following?: none Diabetic:: no  Functional Status Activities of Daily Living (to include ambulation/medication): Independent Ambulation: Independent with device- listed below Home Assistive Devices/Equipment: Cane Medication Administration: Independent Home Management (perform basic housework or laundry): Independent Manage your own finances?: yes Primary transportation is: driving Concerns about vision?: no *vision screening is required for WTM* Concerns about hearing?: no  Fall Screening Falls in the past year?: 0 Number of falls in past year: 0 Was there an injury with Fall?: 0 Fall Risk Category Calculator: 0 Patient Fall Risk Level: Low Fall Risk  Fall Risk Patient at Risk for Falls Due to: No Fall Risks Fall risk Follow up: Falls evaluation completed; Falls prevention discussed  Home and Transportation Safety: All rugs have non-skid backing?: yes All stairs or steps have railings?: yes Grab bars in the bathtub or shower?: yes Have non-skid surface in bathtub  or shower?: yes Good home lighting?: yes Regular seat belt use?: yes Hospital stays in the last year:: no  Cognitive Assessment Difficulty concentrating, remembering, or making decisions? : yes (remembering) Will 6CIT or Mini Cog be Completed: yes What year is it?: 0 points What month is it?: 0 points Give patient an address phrase to remember (5 components): [address] [place], [place] About what time is it?: 0 points Count backwards from 20 to 1: 0 points Say the months of the year in reverse: 2 points (August) Repeat the address phrase from earlier: 0 points 6 CIT Score: 2 points  Advance Directives (For Healthcare) Does Patient Have a Medical Advance Directive?: No Would patient like information on creating a medical advance directive?: No - Patient declined  Reviewed/Updated  Reviewed/Updated: Reviewed All (Medical, Surgical, Family, Medications, Allergies, Care Teams, Patient Goals)    Allergies (verified) Sulfonamide derivatives and Tramadol hcl   Current Medications (verified) Outpatient Encounter Medications as of 04/23/2024  Medication Sig   acetaminophen  (TYLENOL ) 500 MG tablet Take 500-1,000 mg by mouth every 6 (six) hours as needed (for pain.).   aspirin 81 MG chewable tablet Chew 81 mg by mouth at bedtime.    benazepril  (LOTENSIN ) 20 MG tablet Take 1 tablet (20 mg total) by mouth daily.   bisoprolol -hydrochlorothiazide  (ZIAC ) 5-6.25 MG tablet Take 1 tablet by mouth daily.   calcium carbonate (OSCAL) 1500 (600 Ca) MG TABS tablet Take 600 mg of elemental calcium by mouth 2 (two) times daily with a meal.   cholecalciferol (VITAMIN D3) 10 MCG (400 UNIT) TABS tablet Take 400 Units by mouth at bedtime.    ibuprofen (ADVIL) 200 MG tablet Take 400 mg by mouth every 8 (eight) hours as needed (pain.).  Multiple Vitamin (MULTIVITAMIN WITH MINERALS) TABS tablet Take 1 tablet by mouth daily. Centrum Silver   Omega-3 Fatty Acids (FISH OIL) 1000 MG CAPS Take 1,000 mg by  mouth 2 (two) times daily.   pantoprazole  (PROTONIX ) 40 MG tablet Take 1 tablet (40 mg total) by mouth daily.   pravastatin  (PRAVACHOL ) 20 MG tablet Take 1 tablet (20 mg total) by mouth daily.   Propylene Glycol 0.6 % SOLN Place 1 drop 3 (three) times daily as needed into both eyes (dry eyes).   vitamin E 400 UNIT capsule Take 400 Units by mouth daily.   No facility-administered encounter medications on file as of 04/23/2024.    History: Past Medical History:  Diagnosis Date   Abnormal finding on Pap smear    x1   Allergy    Arthritis    severe in back and left hip   Asthma    Cancer (HCC)    breast cancer (left), chemotherapy and radiation   Cataract    had surgery   Diverticulosis    GERD (gastroesophageal reflux disease)    Hyperlipidemia    Hypertension    Mitral valve prolapse    Osteopenia    -1.5 @ femoral neck 11/2007   Reactive airway disease    years ago ; severe asthma attack bornchitis asthma  per patient, no issues since    Past Surgical History:  Procedure Laterality Date   BREAST LUMPECTOMY WITH RADIOACTIVE SEED AND SENTINEL LYMPH NODE BIOPSY Left 09/16/2019   Procedure: LEFT BREAST LUMPECTOMY WITH RADIOACTIVE SEED AND SENTINEL LYMPH NODE BIOPSY;  Surgeon: Belinda Cough, MD;  Location: [facility];  Service: General;  Laterality: Left;   CARDIOVASCULAR STRESS TEST  03/05/06   CARPAL TUNNEL RELEASE  2009    bilaterally   CATARACT EXTRACTION, BILATERAL     Dr Roz   COLONOSCOPY W/ POLYPECTOMY  1998   Tics @ 2004 & 2012; Dr Avram   HAND SURGERY     Trigger thumb   PARTIAL KNEE ARTHROPLASTY Left 04/03/2017   Procedure: Left knee medial unicompartmental arthroplasty;  Surgeon: Melodi Lerner, MD;  Location: [facility];  Service: Orthopedics;  Laterality: Left;  Adductor canal block   PORTACATH PLACEMENT N/A 09/29/2019   Procedure: INSERTION PORT-A-CATH;  Surgeon: Belinda Cough, MD;  Location: [facility];  Service: General;  Laterality: N/A;    TRIGGER FINGER RELEASE Right    TUBAL LIGATION     Family History  Problem Relation Age of Onset   Asthma Sister    Hypertension Sister    Hypertension Father    Heart attack Father        MI > 44   Prostate cancer Father    Lung cancer Father    Lung cancer Sister        smoker   Melanoma Sister    Osteoporosis Sister    Glaucoma Sister        X 2   Diverticulosis Sister        2 sisters   Lung cancer Sister    Colon polyps Sister        2 sisters with polyps   Diverticulitis Sister        1 sister   Hypertension Mother    Hypertension Brother    Skin cancer Sister    Diabetes Neg Hx    Stroke Neg Hx    Breast cancer Neg Hx    Colon cancer Neg Hx    Esophageal cancer Neg Hx  Stomach cancer Neg Hx    Social History   Occupational History   Occupation: retired  Tobacco Use   Smoking status: Former    Current packs/day: 0.00    Types: Cigarettes    Quit date: 05/07/1970    Years since quitting: 54.0   Smokeless tobacco: Never   Tobacco comments:    Smoked as a teen  1962-1972,only up to 3 cigarettes/ day  Vaping Use   Vaping status: Never Used  Substance and Sexual Activity   Alcohol use: Yes    Comment: occas   Drug use: No   Sexual activity: Not Currently   Tobacco Counseling Counseling given: Not Answered Tobacco comments: Smoked as a teen  1962-1972,only up to 3 cigarettes/ day  SDOH Screenings   Food Insecurity: No Food Insecurity (04/23/2024)  Housing: Unknown (04/23/2024)  Transportation Needs: No Transportation Needs (04/23/2024)  Utilities: Not At Risk (04/23/2024)  Alcohol Screen: Low Risk (04/21/2023)  Depression (PHQ2-9): Low Risk (04/23/2024)  Financial Resource Strain: Patient Declined (04/21/2023)  Physical Activity: Inactive (04/23/2024)  Social Connections: Moderately Isolated (04/23/2024)  Stress: No Stress Concern Present (04/23/2024)  Tobacco Use: Medium Risk (04/23/2024)  Health Literacy: Adequate Health Literacy (04/23/2024)    See flowsheets for full screening details  Depression Screen PHQ 2 & 9 Depression Scale- Over the past 2 weeks, how often have you been bothered by any of the following problems? Little interest or pleasure in doing things: 0 Feeling down, depressed, or hopeless (PHQ Adolescent also includes...irritable): 0 PHQ-2 Total Score: 0 Trouble falling or staying asleep, or sleeping too much: 1 (falling asleep) Feeling tired or having little energy: 0 Poor appetite or overeating (PHQ Adolescent also includes...weight loss): 0 Feeling bad about yourself - or that you are a failure or have let yourself or your family down: 0 Trouble concentrating on things, such as reading the newspaper or watching television (PHQ Adolescent also includes...like school work): 0 Moving or speaking so slowly that other people could have noticed. Or the opposite - being so fidgety or restless that you have been moving around a lot more than usual: 0 Thoughts that you would be better off dead, or of hurting yourself in some way: 0 PHQ-9 Total Score: 1 If you checked off any problems, how difficult have these problems made it for you to do your work, take care of things at home, or get along with other people?: Not difficult at all     Goals Addressed             This Visit's Progress    Patient Stated       wants to lose weight/still would like to keep this goal/2025             Objective:    Today's Vitals   04/23/24 1040  BP: 130/70  Pulse: (!) 55  SpO2: 97%  Weight: 221 lb 3.2 oz (100.3 kg)  Height: 5' 0.75 (1.543 m)   Body mass index is 42.14 kg/m.  Hearing/Vision screen Hearing Screening - Comments:: Denies hearing difficulties   Vision Screening - Comments:: Had Cataract surgery/[facility]/UTD Immunizations and Health Maintenance Health Maintenance  Topic Date Due   COVID-19 Vaccine (7 - 2025-26 season) 03/17/2024   Mammogram  09/03/2024   Medicare Annual Wellness (AWV)   04/23/2025   DTaP/Tdap/Td (4 - Td or Tdap) 12/27/2031   Pneumococcal Vaccine: 50+ Years  Completed   Influenza Vaccine  Completed   Bone Density Scan  Completed  Hepatitis C Screening  Completed   Zoster Vaccines- Shingrix  Completed   Meningococcal B Vaccine  Aged Out   Colonoscopy  Discontinued        Assessment/Plan:  This is a routine wellness examination for Kim Fuller.  Patient Care Team: Rollene Almarie LABOR, MD as PCP - General (Internal Medicine) Tyree Nanetta SAILOR, RN as Oncology Nurse Navigator Belinda Cough, MD as Consulting Physician (General Surgery) Odean Potts, MD as Consulting Physician (Hematology and Oncology) Dewey Rush, MD as Consulting Physician (Radiation Oncology) Roz Anes, MD as Consulting Physician (Ophthalmology) Szabat, Toribio BROCKS, [facility] (Inactive) (Pharmacist)  I have personally reviewed and noted the following in the patients chart:   Medical and social history Use of alcohol, tobacco or illicit drugs  Current medications and supplements including opioid prescriptions. Functional ability and status Nutritional status Physical activity Advanced directives List of other physicians Hospitalizations, surgeries, and ER visits in previous 12 months Vitals Screenings to include cognitive, depression, and falls Referrals and appointments  Orders Placed This Encounter  Procedures   DG Bone Density    Standing Status:   Future    Expiration Date:   04/23/2025    Reason for Exam (SYMPTOM  OR DIAGNOSIS REQUIRED):   osteopenia    Preferred imaging location?:   [address]   In addition, I have reviewed and discussed with patient certain preventive protocols, quality metrics, and best practice recommendations. A written personalized care plan for preventive services as well as general preventive health recommendations were provided to patient.   Kim Fuller, CMA   04/23/2024   Return in 1 year (on 04/23/2025).  After Visit Summary:  (MyChart) Due to this being a telephonic visit, the after visit summary with patients personalized plan was offered to patient via MyChart   Nurse Notes: Patient is due for a DEXA and order has been placed today.  She had no other concerns to address today.

## 2024-04-27 ENCOUNTER — Ambulatory Visit: Payer: Medicare Other

## 2024-05-05 ENCOUNTER — Encounter: Payer: Self-pay | Admitting: Internal Medicine

## 2024-05-05 ENCOUNTER — Ambulatory Visit: Admitting: Internal Medicine

## 2024-05-05 VITALS — BP 120/70 | HR 51 | Temp 98.6°F | Ht 60.75 in | Wt 220.4 lb

## 2024-05-05 DIAGNOSIS — Z0000 Encounter for general adult medical examination without abnormal findings: Secondary | ICD-10-CM | POA: Diagnosis not present

## 2024-05-05 DIAGNOSIS — K219 Gastro-esophageal reflux disease without esophagitis: Secondary | ICD-10-CM

## 2024-05-05 DIAGNOSIS — E782 Mixed hyperlipidemia: Secondary | ICD-10-CM

## 2024-05-05 DIAGNOSIS — I10 Essential (primary) hypertension: Secondary | ICD-10-CM | POA: Diagnosis not present

## 2024-05-05 LAB — COMPREHENSIVE METABOLIC PANEL WITH GFR
ALT: 21 U/L (ref 3–35)
AST: 25 U/L (ref 5–37)
Albumin: 4.3 g/dL (ref 3.5–5.2)
Alkaline Phosphatase: 57 U/L (ref 39–117)
BUN: 18 mg/dL (ref 6–23)
CO2: 35 meq/L — ABNORMAL HIGH (ref 19–32)
Calcium: 10 mg/dL (ref 8.4–10.5)
Chloride: 101 meq/L (ref 96–112)
Creatinine, Ser: 0.67 mg/dL (ref 0.40–1.20)
GFR: 83.31 mL/min
Glucose, Bld: 97 mg/dL (ref 70–99)
Potassium: 4.2 meq/L (ref 3.5–5.1)
Sodium: 141 meq/L (ref 135–145)
Total Bilirubin: 0.5 mg/dL (ref 0.2–1.2)
Total Protein: 7.5 g/dL (ref 6.0–8.3)

## 2024-05-05 LAB — CBC
HCT: 42.3 % (ref 36.0–46.0)
Hemoglobin: 14.2 g/dL (ref 12.0–15.0)
MCHC: 33.5 g/dL (ref 30.0–36.0)
MCV: 99.9 fl (ref 78.0–100.0)
Platelets: 195 K/uL (ref 150.0–400.0)
RBC: 4.23 Mil/uL (ref 3.87–5.11)
RDW: 12.5 % (ref 11.5–15.5)
WBC: 4.9 K/uL (ref 4.0–10.5)

## 2024-05-05 LAB — LIPID PANEL
Cholesterol: 173 mg/dL (ref 28–200)
HDL: 50.2 mg/dL
LDL Cholesterol: 68 mg/dL (ref 10–99)
NonHDL: 122.72
Total CHOL/HDL Ratio: 3
Triglycerides: 273 mg/dL — ABNORMAL HIGH (ref 10.0–149.0)
VLDL: 54.6 mg/dL — ABNORMAL HIGH (ref 0.0–40.0)

## 2024-05-05 LAB — HEMOGLOBIN A1C: Hgb A1c MFr Bld: 5.7 % (ref 4.6–6.5)

## 2024-05-05 MED ORDER — PANTOPRAZOLE SODIUM 40 MG PO TBEC: 40.0000 mg | DELAYED_RELEASE_TABLET | Freq: Every day | ORAL | 3 refills | Status: AC

## 2024-05-05 MED ORDER — BENAZEPRIL HCL 20 MG PO TABS: 20.0000 mg | ORAL_TABLET | Freq: Every day | ORAL | 3 refills | Status: AC

## 2024-05-05 MED ORDER — PRAVASTATIN SODIUM 20 MG PO TABS: 20.0000 mg | ORAL_TABLET | Freq: Every day | ORAL | 3 refills | Status: AC

## 2024-05-05 MED ORDER — BISOPROLOL-HYDROCHLOROTHIAZIDE 5-6.25 MG PO TABS: 1.0000 | ORAL_TABLET | Freq: Every day | ORAL | 3 refills | Status: AC

## 2024-05-05 NOTE — Progress Notes (Unsigned)
" ° °  Subjective:   Patient ID: Kim Fuller, female    DOB: 31-Jul-1945, 78 y.o.   MRN: [id number]  The patient is here for physical. Pertinent topics discussed: Discussed the use of AI scribe software for clinical note transcription with the patient, who gave verbal consent to proceed.  History of Present Illness Kim Fuller is a 78 year old female who presents for follow-up of chronic back pain and acid reflux.  She experiences chronic back pain that worsens with prolonged standing and is relieved by sitting for five to ten minutes. She uses a cane for balance when outside the house but not indoors. Occasional hip pain at night is managed with Tylenol , ibuprofen, and Voltaren  gel.  She has a history of acid reflux, with symptoms exacerbated by tomato consumption. She does not take acid reflux medication daily and alternates between Prilosec and another medication for management. No new chest pain, breathing problems, or significant gastrointestinal issues such as diarrhea or constipation are reported.  She mentions a family history of asthma affecting her sister, who has passed away. She is cautious about using hot tubs due to concerns about germs and prefers water  aerobics for exercise. She is considering returning to exercise programs like Silver Sneakers and water  aerobics. She is currently taking Pravastatin .  PMH, FMH, social history reviewed and updated  Review of Systems  Constitutional: Negative.   HENT: Negative.    Eyes: Negative.   Respiratory:  Negative for cough, chest tightness and shortness of breath.   Cardiovascular:  Negative for chest pain, palpitations and leg swelling.  Gastrointestinal:  Negative for abdominal distention, abdominal pain, constipation, diarrhea, nausea and vomiting.  Musculoskeletal:  Positive for back pain and myalgias.  Skin: Negative.   Neurological: Negative.   Psychiatric/Behavioral: Negative.      Objective:  Physical  Exam Constitutional:      Appearance: She is well-developed. She is obese.  HENT:     Head: Normocephalic and atraumatic.  Cardiovascular:     Rate and Rhythm: Normal rate and regular rhythm.  Pulmonary:     Effort: Pulmonary effort is normal. No respiratory distress.     Breath sounds: Normal breath sounds. No wheezing or rales.  Abdominal:     General: Bowel sounds are normal. There is no distension.     Palpations: Abdomen is soft.     Tenderness: There is no abdominal tenderness.  Musculoskeletal:        General: Tenderness present.     Cervical back: Normal range of motion.  Skin:    General: Skin is warm and dry.  Neurological:     Mental Status: She is alert and oriented to person, place, and time.     Coordination: Coordination abnormal.     Comments: cane    Vitals:   05/05/24 1422  BP: 120/70  Pulse: (!) 51  Temp: 98.6 F (37 C)  SpO2: 93%  Weight: 220 lb 6.4 oz (100 kg)  Height: 5' 0.75 (1.543 m)    Assessment & Plan:   "

## 2024-05-06 ENCOUNTER — Ambulatory Visit: Payer: Self-pay | Admitting: Internal Medicine

## 2024-05-06 NOTE — Assessment & Plan Note (Signed)
 BMI 41 and counseled about diet and exercise.

## 2024-05-06 NOTE — Assessment & Plan Note (Signed)
 Checking lipid panel and adjust pravastatin as needed.

## 2024-05-06 NOTE — Assessment & Plan Note (Signed)
 Flu shot yearly. Pneumonia complete. Shingrix complete. Tetanus up to date. Colonoscopy up to date aged out future. Mammogram up to date, pap smear aged out and dexa up to date. Counseled about sun safety and mole surveillance. Counseled about the dangers of distracted driving. Given 10 year screening recommendations.

## 2024-05-06 NOTE — Assessment & Plan Note (Signed)
Taking protonix 40 mg daily and continue.

## 2024-05-06 NOTE — Assessment & Plan Note (Signed)
 BP at goal checking CMP and adjust regimen as needed.

## 2024-06-25 ENCOUNTER — Other Ambulatory Visit

## 2024-11-12 ENCOUNTER — Ambulatory Visit: Admitting: Hematology and Oncology

## 2024-11-16 ENCOUNTER — Ambulatory Visit

## 2024-11-30 ENCOUNTER — Ambulatory Visit: Admitting: Rehabilitation

## 2025-05-03 ENCOUNTER — Ambulatory Visit
